# Patient Record
Sex: Female | Born: 1975 | Race: Black or African American | Hispanic: No | Smoking: Current every day smoker
Health system: Southern US, Community
[De-identification: ages and names within clinical notes are randomized; demographics above are authoritative.]

## PROBLEM LIST (undated history)

## (undated) DIAGNOSIS — D649 Anemia, unspecified: Secondary | ICD-10-CM

## (undated) DIAGNOSIS — M545 Low back pain, unspecified: Secondary | ICD-10-CM

## (undated) DIAGNOSIS — D219 Benign neoplasm of connective and other soft tissue, unspecified: Secondary | ICD-10-CM

## (undated) DIAGNOSIS — M5090 Cervical disc disorder, unspecified, unspecified cervical region: Secondary | ICD-10-CM

## (undated) DIAGNOSIS — K56600 Partial intestinal obstruction, unspecified as to cause: Secondary | ICD-10-CM

## (undated) DIAGNOSIS — R011 Cardiac murmur, unspecified: Secondary | ICD-10-CM

## (undated) HISTORY — PX: ANKLE SURGERY: SHX546

## (undated) HISTORY — PX: TUBAL LIGATION: SHX77

---

## 1998-02-07 ENCOUNTER — Inpatient Hospital Stay (HOSPITAL_COMMUNITY): Admission: AD | Admit: 1998-02-07 | Discharge: 1998-02-07 | Payer: Self-pay | Admitting: Obstetrics

## 1998-06-18 ENCOUNTER — Emergency Department (HOSPITAL_COMMUNITY): Admission: EM | Admit: 1998-06-18 | Discharge: 1998-06-18 | Payer: Self-pay | Admitting: Emergency Medicine

## 1998-12-26 ENCOUNTER — Encounter: Payer: Self-pay | Admitting: Emergency Medicine

## 1998-12-27 ENCOUNTER — Encounter: Payer: Self-pay | Admitting: Orthopedic Surgery

## 1998-12-27 ENCOUNTER — Inpatient Hospital Stay (HOSPITAL_COMMUNITY): Admission: EM | Admit: 1998-12-27 | Discharge: 1998-12-28 | Payer: Self-pay | Admitting: Emergency Medicine

## 1999-06-23 ENCOUNTER — Inpatient Hospital Stay (HOSPITAL_COMMUNITY): Admission: AD | Admit: 1999-06-23 | Discharge: 1999-06-23 | Payer: Self-pay | Admitting: Obstetrics

## 1999-09-10 ENCOUNTER — Emergency Department (HOSPITAL_COMMUNITY): Admission: EM | Admit: 1999-09-10 | Discharge: 1999-09-10 | Payer: Self-pay | Admitting: Emergency Medicine

## 1999-12-15 ENCOUNTER — Inpatient Hospital Stay (HOSPITAL_COMMUNITY): Admission: AD | Admit: 1999-12-15 | Discharge: 1999-12-15 | Payer: Self-pay | Admitting: Obstetrics

## 2000-01-05 ENCOUNTER — Other Ambulatory Visit: Admission: RE | Admit: 2000-01-05 | Discharge: 2000-01-05 | Payer: Self-pay | Admitting: Obstetrics

## 2000-10-21 ENCOUNTER — Emergency Department (HOSPITAL_COMMUNITY): Admission: EM | Admit: 2000-10-21 | Discharge: 2000-10-21 | Payer: Self-pay

## 2001-02-27 ENCOUNTER — Other Ambulatory Visit: Admission: RE | Admit: 2001-02-27 | Discharge: 2001-02-27 | Payer: Self-pay | Admitting: Obstetrics

## 2001-04-27 ENCOUNTER — Inpatient Hospital Stay (HOSPITAL_COMMUNITY): Admission: AD | Admit: 2001-04-27 | Discharge: 2001-04-27 | Payer: Self-pay | Admitting: Obstetrics

## 2001-06-30 ENCOUNTER — Emergency Department (HOSPITAL_COMMUNITY): Admission: EM | Admit: 2001-06-30 | Discharge: 2001-06-30 | Payer: Self-pay | Admitting: Emergency Medicine

## 2002-08-07 ENCOUNTER — Inpatient Hospital Stay (HOSPITAL_COMMUNITY): Admission: AD | Admit: 2002-08-07 | Discharge: 2002-08-07 | Payer: Self-pay | Admitting: Obstetrics

## 2002-08-13 ENCOUNTER — Inpatient Hospital Stay (HOSPITAL_COMMUNITY): Admission: AD | Admit: 2002-08-13 | Discharge: 2002-08-16 | Payer: Self-pay | Admitting: Obstetrics

## 2002-08-13 ENCOUNTER — Encounter (INDEPENDENT_AMBULATORY_CARE_PROVIDER_SITE_OTHER): Payer: Self-pay

## 2003-02-08 ENCOUNTER — Emergency Department (HOSPITAL_COMMUNITY): Admission: EM | Admit: 2003-02-08 | Discharge: 2003-02-09 | Payer: Self-pay | Admitting: Emergency Medicine

## 2003-05-08 ENCOUNTER — Inpatient Hospital Stay (HOSPITAL_COMMUNITY): Admission: AD | Admit: 2003-05-08 | Discharge: 2003-05-08 | Payer: Self-pay | Admitting: Obstetrics

## 2003-11-15 ENCOUNTER — Inpatient Hospital Stay (HOSPITAL_COMMUNITY): Admission: AD | Admit: 2003-11-15 | Discharge: 2003-11-15 | Payer: Self-pay | Admitting: Obstetrics

## 2004-03-05 ENCOUNTER — Emergency Department (HOSPITAL_COMMUNITY): Admission: EM | Admit: 2004-03-05 | Discharge: 2004-03-05 | Payer: Self-pay | Admitting: Emergency Medicine

## 2005-01-10 ENCOUNTER — Inpatient Hospital Stay (HOSPITAL_COMMUNITY): Admission: AD | Admit: 2005-01-10 | Discharge: 2005-01-10 | Payer: Self-pay | Admitting: Obstetrics

## 2005-05-16 ENCOUNTER — Emergency Department (HOSPITAL_COMMUNITY): Admission: EM | Admit: 2005-05-16 | Discharge: 2005-05-17 | Payer: Self-pay | Admitting: Emergency Medicine

## 2005-05-22 ENCOUNTER — Inpatient Hospital Stay (HOSPITAL_COMMUNITY): Admission: AD | Admit: 2005-05-22 | Discharge: 2005-05-22 | Payer: Self-pay | Admitting: Obstetrics

## 2005-09-09 ENCOUNTER — Inpatient Hospital Stay (HOSPITAL_COMMUNITY): Admission: AD | Admit: 2005-09-09 | Discharge: 2005-09-09 | Payer: Self-pay | Admitting: Obstetrics

## 2006-03-17 ENCOUNTER — Emergency Department (HOSPITAL_COMMUNITY): Admission: EM | Admit: 2006-03-17 | Discharge: 2006-03-17 | Payer: Self-pay | Admitting: Emergency Medicine

## 2006-04-12 ENCOUNTER — Encounter: Payer: Self-pay | Admitting: Orthopedic Surgery

## 2007-05-08 ENCOUNTER — Emergency Department (HOSPITAL_COMMUNITY): Admission: EM | Admit: 2007-05-08 | Discharge: 2007-05-08 | Payer: Self-pay | Admitting: Family Medicine

## 2007-05-17 ENCOUNTER — Emergency Department (HOSPITAL_COMMUNITY): Admission: EM | Admit: 2007-05-17 | Discharge: 2007-05-17 | Payer: Self-pay | Admitting: Emergency Medicine

## 2007-10-02 ENCOUNTER — Emergency Department (HOSPITAL_COMMUNITY): Admission: EM | Admit: 2007-10-02 | Discharge: 2007-10-02 | Payer: Self-pay | Admitting: Emergency Medicine

## 2007-12-31 ENCOUNTER — Emergency Department (HOSPITAL_COMMUNITY): Admission: EM | Admit: 2007-12-31 | Discharge: 2007-12-31 | Payer: Self-pay | Admitting: Family Medicine

## 2008-01-31 ENCOUNTER — Inpatient Hospital Stay (HOSPITAL_COMMUNITY): Admission: AD | Admit: 2008-01-31 | Discharge: 2008-02-01 | Payer: Self-pay | Admitting: Obstetrics

## 2008-03-28 ENCOUNTER — Emergency Department (HOSPITAL_COMMUNITY): Admission: EM | Admit: 2008-03-28 | Discharge: 2008-03-28 | Payer: Self-pay | Admitting: Emergency Medicine

## 2008-06-11 ENCOUNTER — Emergency Department (HOSPITAL_COMMUNITY): Admission: EM | Admit: 2008-06-11 | Discharge: 2008-06-11 | Payer: Self-pay | Admitting: Emergency Medicine

## 2008-07-09 ENCOUNTER — Inpatient Hospital Stay (HOSPITAL_COMMUNITY): Admission: AD | Admit: 2008-07-09 | Discharge: 2008-07-09 | Payer: Self-pay | Admitting: Obstetrics

## 2008-07-17 ENCOUNTER — Observation Stay (HOSPITAL_COMMUNITY): Admission: AD | Admit: 2008-07-17 | Discharge: 2008-07-18 | Payer: Self-pay | Admitting: Obstetrics

## 2008-07-19 ENCOUNTER — Inpatient Hospital Stay (HOSPITAL_COMMUNITY): Admission: AD | Admit: 2008-07-19 | Discharge: 2008-07-21 | Payer: Self-pay | Admitting: Obstetrics

## 2008-07-22 ENCOUNTER — Inpatient Hospital Stay (HOSPITAL_COMMUNITY): Admission: AD | Admit: 2008-07-22 | Discharge: 2008-07-22 | Payer: Self-pay | Admitting: Obstetrics

## 2008-08-04 ENCOUNTER — Inpatient Hospital Stay (HOSPITAL_COMMUNITY): Admission: AD | Admit: 2008-08-04 | Discharge: 2008-08-07 | Payer: Self-pay | Admitting: Obstetrics

## 2008-08-19 ENCOUNTER — Encounter (INDEPENDENT_AMBULATORY_CARE_PROVIDER_SITE_OTHER): Payer: Self-pay | Admitting: Obstetrics

## 2008-08-19 ENCOUNTER — Inpatient Hospital Stay (HOSPITAL_COMMUNITY): Admission: AD | Admit: 2008-08-19 | Discharge: 2008-08-22 | Payer: Self-pay | Admitting: Obstetrics

## 2008-09-14 ENCOUNTER — Emergency Department (HOSPITAL_COMMUNITY): Admission: EM | Admit: 2008-09-14 | Discharge: 2008-09-14 | Payer: Self-pay | Admitting: Emergency Medicine

## 2008-09-18 ENCOUNTER — Encounter: Payer: Self-pay | Admitting: Emergency Medicine

## 2008-09-19 ENCOUNTER — Inpatient Hospital Stay (HOSPITAL_COMMUNITY): Admission: RE | Admit: 2008-09-19 | Discharge: 2008-09-22 | Payer: Self-pay | Admitting: Internal Medicine

## 2010-12-11 ENCOUNTER — Emergency Department (HOSPITAL_COMMUNITY)
Admission: EM | Admit: 2010-12-11 | Discharge: 2010-12-11 | Payer: Self-pay | Source: Home / Self Care | Admitting: Emergency Medicine

## 2011-02-21 LAB — URINE CULTURE
Colony Count: >=100000
Culture  Setup Time: 201201011045

## 2011-02-21 LAB — URINE MICROSCOPIC-ADD ON

## 2011-02-21 LAB — URINALYSIS, ROUTINE W REFLEX MICROSCOPIC
Bilirubin Urine: NEGATIVE
Glucose, UA: NEGATIVE mg/dL
Ketones, ur: NEGATIVE mg/dL
Nitrite: POSITIVE — AB
Protein, ur: NEGATIVE mg/dL
Specific Gravity, Urine: 1.014 (ref 1.005–1.030)
Urobilinogen, UA: 1 mg/dL (ref 0.0–1.0)
pH: 6.5 (ref 5.0–8.0)

## 2011-02-21 LAB — WET PREP, GENITAL
Trich, Wet Prep: NONE SEEN
Yeast Wet Prep HPF POC: NONE SEEN

## 2011-02-21 LAB — GC/CHLAMYDIA PROBE AMP, GENITAL
Chlamydia, DNA Probe: NEGATIVE
GC Probe Amp, Genital: POSITIVE — AB

## 2011-02-21 LAB — POCT PREGNANCY, URINE: Preg Test, Ur: NEGATIVE

## 2011-04-26 NOTE — Discharge Summary (Signed)
NAMECHRISTLE, NOLTING               ACCOUNT NO.:  [id number]   MEDICAL RECORD NO.:  [id number]          PATIENT TYPE:  INP   LOCATION:  9117                          FACILITY:  [facility]   PHYSICIAN:  Kathreen Cosier, M.D.DATE OF BIRTH:  [birth year]   DATE OF ADMISSION:  08/19/2008  DATE OF DISCHARGE:  08/22/2008                               DISCHARGE SUMMARY   The patient is a 35 year old gravida 4, para 3-0-0-3.  She had 3  previous C-sections and was hospitalized with multiple times with  premature contractions.  She also desired sterilization.  She was now at  term.  EDC was September 09, 2008.  The patient underwent repeat low-  transverse cesarean section.  She had a female, Apgar 9 and 9, and  weighing 7 pounds 5 ounces.  Placenta was posterior.  Postop hemoglobin was 7.7.  She was asymptomatic and treated with  ferrous sulfate 325 p.o. b.i.d.  Urine was negative.  RPR was negative.  HIV negative.  She did well postop and was discharged on the third  postoperative day, ambulatory, on a regular diet, and to see me in 6  weeks.   DISCHARGE DIAGNOSIS:  Status post repeat low-transverse cesarean section  and tubal ligation at term.           ______________________________  Kathreen Cosier, M.D.     BAM/MEDQ  D:  09/17/2008  T:  09/17/2008  Job:  [id number]

## 2011-04-26 NOTE — H&P (Signed)
NAMEDARLETTE, DUBOW               ACCOUNT NO.:  [id number]   MEDICAL RECORD NO.:  [id number]          PATIENT TYPE:  INP   LOCATION:  5011                         FACILITY:  [facility]   PHYSICIAN:  Peggye Pitt, M.D. DATE OF BIRTH:  1975-12-17   DATE OF ADMISSION:  09/19/2008  DATE OF DISCHARGE:                              HISTORY & PHYSICAL   PRIMARY CARE PHYSICIAN:  Unassigned.   CHIEF COMPLAINT:  Fever and cough.   HISTORY OF PRESENT ILLNESS:  Mrs. Kun is a 35 year old African American  woman who had a recent delivery of a healthy baby boy on August 19, 2008, who subsequently returned to the emergency department on September 14, 2008, with high fever and cough.  Chest x-ray showed a right left  lower lobe pneumonia and was given a Z-pack and sent home.  She returns  today, 4-days later, because of increased fevers up to 103 degrees  Fahrenheit, as well as cough productive of yellow sputum.  She denies  any chest pain or any other symptoms, this is why we are called for  admission.   ALLERGIES:  NO KNOWN DRUG ALLERGIES.   PAST MEDICAL HISTORY:  Not significant.   HOME MEDICATIONS:  None.   FAMILY HISTORY:  Noncontributory.   SOCIAL HISTORY:  She denies any alcohol, tobacco or illicit drug use.  Lives with her husband and her newborn son.   REVIEW OF SYSTEMS:  Negative except as per HPI.   PHYSICAL EXAMINATION:  VITAL SIGNS:  Upon admission, blood pressure  103/49, heart rate 97, respirations 20, O2 sats 93% on room air with a  temperature of 99.6.  GENERAL:  She is alert, awake and oriented x3 in no acute distress.  HEENT:  Normocephalic, atraumatic.  Her pupils are equal and reactive to  light and accommodation with intact extraocular movements.  NECK:  Supple with no JVD, no lymphadenopathy, no bruits of goiter.  HEART:  Regular rate and rhythm with no murmurs, rubs or gallops.  LUNGS:  She has bibasilar crackles.  The crackles extend as well up to  her right  mid lung field.  No rhonchi or wheezes auscultated.  ABDOMEN:  Soft, nontender, nondistended with positive bowel sounds.  EXTREMITIES:  No edema with positive pedal pulses.  NEUROLOGIC:  Grossly intact and nonfocal.   LABORATORY DATA:  Upon admission, sodium 140, potassium 3.4, chloride  105, bicarb 22, BUN less than 3, creatinine 0.9, glucose 92.  WBC 3.9.  Hemoglobin 9.2.  Of note, it was 7.7 after her delivery on August 22, 2008.  Her MCV is 84.7 and platelets of 206.  She had a chest x-ray that  showed bilateral pneumonia with marked progression on the right.   ASSESSMENT/PLAN:  1. Pneumonia.  Will check and blood and sputum cultures.  Will cover      it as a community-acquired pneumonia with Rocephin and azithromycin      daily.  Will also place on Tamiflu given possibility of influenza.      Will keep on droplet precautions.  2. Hypokalemia.  Will replete p.o. and check  a magnesium level and      replete that as necessary.  3. For her anemia which appears to be normocytic, she was diagnosed      with iron deficiency anemia after delivery and of note, her      hemoglobin has improved significantly, 2 gm over the past month.  4. For her leukopenia which of course would concern for me for human      immunodeficiency virus, however, during her recent delivery her      human immunodeficiency virus test was negative.  Will just follow.      Of note, a viral prodrome could cause leukopenia.  5. For prophylaxis while in the hospital will place on Protonix for      gastrointestinal prophylaxis and subcutaneous Lovenox for deep      venous thrombosis prophylaxis.      Peggye Pitt, M.D.  Electronically Signed     EH/MEDQ  D:  09/19/2008  T:  09/19/2008  Job:  [id number]

## 2011-04-26 NOTE — Discharge Summary (Signed)
[name], [name]               ACCOUNT NO.:  [id number]   MEDICAL RECORD NO.:  [id number]          PATIENT TYPE:  INP   LOCATION:  5011                         FACILITY:  [facility]   PHYSICIAN:  Megan Scott, MD     DATE OF BIRTH:  08/13/1976   DATE OF ADMISSION:  09/19/2008  DATE OF DISCHARGE:  09/22/2008                               DISCHARGE SUMMARY   PRIMARY MEDICAL DOCTOR:  [name] but a referral has been provided to  see Dr. Della Compton as an outpatient.   DISCHARGE DIAGNOSES:  1. Bilateral pneumonia.  Likely community-acquired pneumonia versus ?      postinfluenza.  2. Normocytic anemia.  3. Tobacco abuse.  4. Hypokalemia - repleted.   DISCHARGE MEDICATIONS:  1. Tamiflu 75 mg p.o. b.i.d. to complete 5-day course.  2. Avelox 400 mg p.o. daily for 6 days.  3. Nicotine 21 mg per 24-hour patch 1 daily for 5 weeks then taper.  4. Robitussin-DM 5 mL p.o. q. 6 hourly p.r.n.  5. Iron tablets at previous home dose.   PROCEDURES:  Chest x-ray on September 18, 2008: Impression is the patient  has bilateral pneumonia with marked progression of pneumonia on the  right.   PERTINENT LABS:  1. Basic metabolic panel today unremarkable with BUN of less than 1,      creatinine 0.64, calcium 8.3 but albumin was low and corrected to      that, it is normal.  2. CBCs with hemoglobin 8.5, hematocrit 26.2, white blood cell 3.6,      platelets 256.  3. Sputum culture was normal oropharyngeal flora.  4. Blood cultures x4 are no growth to date.   CONSULTATIONS:  This was discussed with infectious disease doctor, [name].   HOSPITAL COURSE AND PATIENT DISPOSITION:  Please refer to the history  and physical note for initial admission details.  In summary, [name]  is a very pleasant 35 year old [place] American female patient who  recently delivered a healthy baby on August 19, 2008.  She was seen on  October 4 at the emergency department with fever and cough and  assessed  to have right lower lobe pneumonia and discharged on Z-Pak.  However,  she returned this time, 4 days later, with worsening fever up to 103  degrees Fahrenheit and cough productive of yellow sputum.  She was  afebrile in the emergency room but her chest x-ray suggested worsening  bilateral pneumonia especially on the right, and the patient was thereby  admitted for further evaluation and management.   1. Bilateral pneumonia, right greater than the left.  The patient was      admitted through the [facility] emergency department.      The patient was subsequently transferred to [facility].      She was placed on droplet precautions.  Cultures were sent off.      The sputum culture was not helpful, but the blood cultures are      negative to date.  The patient was empirically placed on IV      ceftriaxone and  Zithromax and Tamiflu. The patient has remained      afebrile in the hospital.  She has progressively done well and has      had almost resolution of her cough.  There is no fever, chest pain      or dyspnea.  The patient has been ambulant and tolerated feeds.      She has been eager to go home since yesterday.  I have discussed      her case in detail with infectious disease MD who suggests that she      could be discharged home but has to follow precautions such as      wearing a loop mask at home until she has completed her course of      Tamiflu and has been afebrile for a 7 days,and hygiene.  She is      also advised to discuss with her child's pediatrician regarding      empiric Tamiflu for her child.  Also, if child develops any      symptoms such as fever, cough or anything unusual, she has been      advised to seek immediate medical attention for the child.  She has      not breast fed the child for 2 weeks and indicates that she is not      going to breastfeed her child anyway.  She is to follow up with her      primary medical doctor in a week's  time with repeat CBCs.  Also,      tobacco cessation has been counseled and the patient requested the      nicotine patch which will be provided.  2. Anemia.  Unclear what her baseline is but she says that her      hemoglobin was low and she has been given iron supplements. The      patient is advised to continue the same and follow-up with primary      medical doctor with repeat CBC in a week's time.  3. Tobacco abuse.  The patient counseling done.  4. Hypokalemia.  Repleted.   The patient at this time is stable to be discharged and be followed up  as an outpatient.      Megan Scott, MD  Electronically Signed     AH/MEDQ  D:  09/22/2008  T:  09/22/2008  Job:  [id number]   cc:   [name], M.D.  [name], M.D.  Megan Sell, MD

## 2011-04-26 NOTE — Discharge Summary (Signed)
[name], COBERN               ACCOUNT NO.:  [id number]   MEDICAL RECORD NO.:  [id number]          PATIENT TYPE:  INP   LOCATION:  9155                          [facility]ILITY[facility]   PHYSICIAN:  Kathreen Cosier, M.D.DATE OF BIRTH:  03-01-1976   DATE OF ADMISSION:  07/19/2008  DATE OF DISCHARGE:  07/21/2008                               DISCHARGE SUMMARY   The patient is a 35 year old gravida 5, para 3-0-1-3, had three previous  C-sections, and [facility] September 09, 2008.  She was admitted with premature  contractions.  She had been on terbutaline at home, and she was admitted  and placed on magnesium sulfate 4 g and avoiding 2 g an hour.  By July 20, 2008, her contractions had stopped, and she was placed on Procardia  XL 30 b.i.d. and discharged on July 21, 2008.   DISCHARGE DIAGNOSIS:  Status post premature contractions and is to see  me in 2 weeks.           ______________________________  Kathreen Cosier, M.D.     BAM/MEDQ  D:  08/06/2008  T:  08/06/2008  Job:  [id number]

## 2011-04-29 NOTE — Op Note (Signed)
   NAME:  [name], [name]                         ACCOUNT NO.:  [id number]   MEDICAL RECORD NO.:  [id number]                   PATIENT TYPE:  INP   LOCATION:  9133                                 [facility]ACILITY[facility]   PHYSICIAN:  Kathreen Cosier, M.D.           DATE OF BIRTH:  Jun 17, 1976   DATE OF PROCEDURE:  08/13/2002  DATE OF DISCHARGE:                                 OPERATIVE REPORT   PREOPERATIVE DIAGNOSES:  1. Previous cesarean section x2.  2. At 36 weeks in labor.   POSTOPERATIVE DIAGNOSES:  1. Previous cesarean section x2.  2. At 36 weeks in labor.   PROCEDURE:  Repeat low transverse cesarean section.   SURGEON:  Kathreen Cosier, M.D.   ANESTHESIA:  Spinal.   DESCRIPTION OF PROCEDURE:  The patient was placed on the operating table in  the supine position.  After the spinal, the abdomen prepped and draped,  bladder emptied with a Foley catheter.  Transverse suprapubic incision is  made and carried down to the rectus fascia.  The fascia was cleaned and  incised the length of the incision.  Recti muscles were retracted laterally.  The peritoneum was incised longitudinally.  There were multiple adhesions  between the anterior peritoneum and the anterior surface of the uterus.  This was dissected with blunt dissection.  A transverse lower uterine  incision was made.  The fluid was clear.  The patient was delivered with the  vacuum after both recti muscles were cut to create the space and delivered  from the OP position of a female, Apgars 6 and 8, weighing 5 pounds, 11  ounces.  There two pop offs of the vacuum.  The team was in attendance.  The  fluid was cleared.  The placenta was anterior and removed manually and sent  to pathology.  The uterine cavity was cleaned with dry laps.  The uterine  incision was closed in one layer with continuous suture of #1 chromic.  Hemostasis was satisfactory.  Tubes and ovaries were normal.  The fascia was  closed with continuous suture  of 0 Dexon and the skin closed with  subcuticular stitch of 3-0 plain.  The patient tolerated the procedure well  and was taken to the recovery room in good condition.  Blood loss was 600  cc.                                               Kathreen Cosier, M.D.    BAM/MEDQ  D:  08/13/2002  T:  08/13/2002  Job:  [id number]

## 2011-04-29 NOTE — H&P (Signed)
   NAME:  [name], [name]                         ACCOUNT NO.:  [id number]   MEDICAL RECORD NO.:  [id number]                   PATIENT TYPE:  INP   LOCATION:  9133                                 [facility]   PHYSICIAN:  Kathreen Cosier, M.D.           DATE OF BIRTH:  [birth year]   DATE OF ADMISSION:  08/13/2002  DATE OF DISCHARGE:                                HISTORY & PHYSICAL   HISTORY:  The patient is a 35 year old gravida 4, para 2, 1 admitted  August 13, 2002.  Had two previous C-sections and, during this pregnancy,  has had episodes of premature contractions.  Has been on terbutaline, came  in tonight laboring but terbutaline had no effect.  She was 36+ weeks.  It  was decided that she would deliver by repeat C-section.   PHYSICAL EXAMINATION:  GENERAL:  Well-developed female in labor.  HEENT:  Negative.  LUNGS:  Clear.  HEART:  Regular rate and rhythm.  No murmurs.  No gallops.  ABDOMEN:  Term-sized uterus.  The cervix was fingertip with a vertex  __________.                                                Kathreen Cosier, M.D.    BAM/MEDQ  D:  08/13/2002  T:  08/13/2002  Job:  [id number]

## 2011-04-29 NOTE — Discharge Summary (Signed)
   NAME:  [name], [name]                         ACCOUNT NO.:  [id number]   MEDICAL RECORD NO.:  [id number]                   PATIENT TYPE:  INP   LOCATION:  9133                                 [facility]   PHYSICIAN:  Kathreen Cosier, M.D.           DATE OF BIRTH:  1976-05-12   DATE OF ADMISSION:  08/13/2002  DATE OF DISCHARGE:  08/16/2002                                 DISCHARGE SUMMARY   HISTORY OF PRESENT ILLNESS:  The patient is a 35 year old gravida 4 para 2-0-  1-2, EDC September 08, 2002.  She had two previous C sections and had been  on terbutaline throughout this pregnancy and was admitted in labor.  She was  for repeat C section with two previous C sections.   HOSPITAL COURSE:  The patient, after having a spinal had a low transverse  repeat C section.  She had a female, Apgar 6 and 8, weighing 5 pounds 11  ounces.  The placenta was anterior and sent to pathology.  On admission her  hemoglobin was 10.9, postoperatively 8.1.  She was asymptomatic and  discharged on postoperative day #3, ambulatory, on a regular diet, to see me  in six weeks.   DISCHARGE DIAGNOSIS:  Status post repeat low transverse cesarean section at  36+ weeks because of two previous C sections, in labor.                                               Kathreen Cosier, M.D.    BAM/MEDQ  D:  08/16/2002  T:  08/16/2002  Job:  [id number]

## 2011-04-29 NOTE — Op Note (Signed)
[name], [name]               ACCOUNT NO.:  [id number]   MEDICAL RECORD NO.:  [id number]          PATIENT TYPE:  INP   LOCATION:  9117                          [facility]ACILITY[facility]   PHYSICIAN:  Kathreen Cosier, M.D.DATE OF BIRTH:  01-18-1976   DATE OF PROCEDURE:  DATE OF DISCHARGE:  08/22/2008                               OPERATIVE REPORT   PREOPERATIVE DIAGNOSES:  1. Previous cesarean section at term.  2. Multiparity.  3. Premature labor.   SURGEON:  Kathreen Cosier, MD.   FIRST ASSISTANT:  Charles A. Clearance Coots, MD   ANESTHESIA:  Spinal.   PROCEDURE:  The patient placed on the operating table in the supine  position.  After the spinal anesthesia was administered, the abdomen was  prepped and draped.  Bladder emptied with a Foley catheter.  A  transverse suprapubic incision was made through the old scar and carried  down to the rectus fascia.  Fascia cleaned and incised to length of the  incision.  The recti muscles were retracted laterally.  Peritoneum was  incised longitudinally.  Transverse incision was made in the visceral  peritoneum above the bladder.  Bladder mobilized inferiorly.  Transverse  lower uterine incision was made.  The fluid was clear.  Team in  attendance.  The patient delivered of a female, Apgar 9 and 9, weighing 7  pounds 5 ounces.  The placenta was posterior fundal and removed manually  and sent to Labor and Delivery.  Uterine cavity cleaned with dry laps.  Uterine incision closed in 1 layer with continuous suture of #1 chromic.  Hemostasis was satisfactory.  Bladder flap reattached with 2-0 chromic.  Uterus well contracted.  Tubes and ovaries were normal.  Right tube  grabbed in mid portion with Babcock clamp.  A 0 plain suture placed in  the mesosalpinx in the portion of tube within the clamp.  This was tied  and then approximately 1 inch of tube transected.  Procedure was done in  a similar fashion on the other side.  Lap and sponge counts were  correct.  Abdomen closed in layers; peritoneum, continuous suture of 0  chromic; fascia, continuous suture of 0 Dexon; and the skin closed with  subcuticular stitch of 4-0 Monocryl.  The patient tolerated the  procedure well and taken to recovery room in good condition.           ______________________________  Kathreen Cosier, M.D.     BAM/MEDQ  D:  09/24/2008  T:  09/24/2008  Job:  [id number]

## 2011-04-29 NOTE — Discharge Summary (Signed)
NAMEDEEM, MARMOL NO.:  [id number]   MEDICAL RECORD NO.:  [id number]         PATIENT TYPE:  WINP   LOCATION:                                [facility]   PHYSICIAN:  Kathreen Cosier, M.D.DATE OF BIRTH:  03-06-1976   DATE OF ADMISSION:  08/04/2008  DATE OF DISCHARGE:  08/07/2008                               DISCHARGE SUMMARY   The patient is a 35 year old gravida 5, para 3-0-1-3, [facility] September 09, 2008, has a three previous C-sections and had multiple admissions  because of premature contractions.  She was on terbutaline p.o. and  hospitalized earlier in the pregnancy.  Cervix closed.  She was started  on ampicillin 2 g IV every 6 hours and gentamicin IV and also magnesium  sulfate.  Premature contractions at 34 weeks.  On August 06, 2008, the  magnesium was discontinued.  She was started on terbutaline 2.5 p.o. q.4  h. and she did well.  By August 07, 2008, she stopped contracting,  progressed in 24 hours, and was discharged home on terbutaline 2.5 p.o.  q.4 h.   DISCHARGE DIAGNOSIS:  Status post premature contractions at 34 weeks.           ______________________________  Kathreen Cosier, M.D.     BAM/MEDQ  D:  08/27/2008  T:  08/27/2008  Job:  [id number]

## 2011-08-09 ENCOUNTER — Other Ambulatory Visit (HOSPITAL_COMMUNITY): Payer: Self-pay | Admitting: Obstetrics

## 2011-08-09 DIAGNOSIS — Z1231 Encounter for screening mammogram for malignant neoplasm of breast: Secondary | ICD-10-CM

## 2011-09-01 LAB — POCT URINALYSIS DIP (DEVICE)
Glucose, UA: NEGATIVE
Nitrite: POSITIVE — AB
Operator id: 270961
Protein, ur: 30 — AB
Specific Gravity, Urine: 1.02
Urobilinogen, UA: 1
pH: 7

## 2011-09-01 LAB — POCT PREGNANCY, URINE
Operator id: 270961
Preg Test, Ur: POSITIVE

## 2011-09-05 LAB — WET PREP, GENITAL
Clue Cells Wet Prep HPF POC: NONE SEEN
Trich, Wet Prep: NONE SEEN
Yeast Wet Prep HPF POC: NONE SEEN

## 2011-09-05 LAB — GC/CHLAMYDIA PROBE AMP, GENITAL
Chlamydia, DNA Probe: NEGATIVE
GC Probe Amp, Genital: NEGATIVE

## 2011-09-06 LAB — STREP A DNA PROBE: Group A Strep Probe: NEGATIVE

## 2011-09-06 LAB — RAPID STREP SCREEN (MED CTR MEBANE ONLY): Streptococcus, Group A Screen (Direct): NEGATIVE

## 2011-09-08 LAB — URINALYSIS, ROUTINE W REFLEX MICROSCOPIC
Bilirubin Urine: NEGATIVE
Glucose, UA: NEGATIVE
Hgb urine dipstick: NEGATIVE
Ketones, ur: NEGATIVE
Nitrite: NEGATIVE
Protein, ur: NEGATIVE
Specific Gravity, Urine: 1.019
Urobilinogen, UA: 1
pH: 7

## 2011-09-08 LAB — WET PREP, GENITAL
Clue Cells Wet Prep HPF POC: NONE SEEN
Trich, Wet Prep: NONE SEEN
WBC, Wet Prep HPF POC: NONE SEEN
Yeast Wet Prep HPF POC: NONE SEEN

## 2011-09-08 LAB — GC/CHLAMYDIA PROBE AMP, GENITAL
Chlamydia, DNA Probe: NEGATIVE
GC Probe Amp, Genital: NEGATIVE

## 2011-09-09 LAB — URINALYSIS, ROUTINE W REFLEX MICROSCOPIC
Bilirubin Urine: NEGATIVE
Glucose, UA: NEGATIVE
Hgb urine dipstick: NEGATIVE
Ketones, ur: NEGATIVE
Nitrite: NEGATIVE
Protein, ur: NEGATIVE
Specific Gravity, Urine: 1.025
Urobilinogen, UA: 0.2
pH: 6.5

## 2011-09-09 LAB — CBC
HCT: 24.9 — ABNORMAL LOW
HCT: 26.6 — ABNORMAL LOW
Hemoglobin: 8.2 — ABNORMAL LOW
Hemoglobin: 8.8 — ABNORMAL LOW
MCHC: 32.8
MCHC: 33
MCV: 91.6
MCV: 92.2
Platelets: 240
Platelets: 243
RBC: 2.7 — ABNORMAL LOW
RBC: 2.91 — ABNORMAL LOW
RDW: 13.9
RDW: 14.1
WBC: 7.9
WBC: 9.8

## 2011-09-09 LAB — RPR: RPR Ser Ql: NONREACTIVE

## 2011-09-09 LAB — MAGNESIUM: Magnesium: 3.9 — ABNORMAL HIGH

## 2011-09-12 LAB — CBC
HCT: 29.2 — ABNORMAL LOW
HCT: 26.2 — ABNORMAL LOW
HCT: 28.2 — ABNORMAL LOW
Hemoglobin: 9.2 — ABNORMAL LOW
Hemoglobin: 8.5 — ABNORMAL LOW
Hemoglobin: 9.1 — ABNORMAL LOW
MCHC: 31.6
MCHC: 32.1
MCHC: 32.4
MCV: 84.7
MCV: 82.8
MCV: 83.8
Platelets: 206
Platelets: 196
Platelets: 258
RBC: 3.45 — ABNORMAL LOW
RBC: 3.16 — ABNORMAL LOW
RBC: 3.37 — ABNORMAL LOW
RDW: 16.7 — ABNORMAL HIGH
RDW: 17.1 — ABNORMAL HIGH
RDW: 17.4 — ABNORMAL HIGH
WBC: 3.9 — ABNORMAL LOW
WBC: 3.6 — ABNORMAL LOW
WBC: 5

## 2011-09-12 LAB — BASIC METABOLIC PANEL
BUN: 3 — ABNORMAL LOW
BUN: <1 — ABNORMAL LOW
CO2: 22
CO2: 24
Calcium: 8 — ABNORMAL LOW
Calcium: 8.3 — ABNORMAL LOW
Chloride: 109
Chloride: 109
Creatinine, Ser: 0.56
Creatinine, Ser: 0.64
GFR calc Af Amer: >60
GFR calc Af Amer: >60
GFR calc non Af Amer: >60
GFR calc non Af Amer: >60
Glucose, Bld: 102 — ABNORMAL HIGH
Glucose, Bld: 92
Potassium: 3.6
Potassium: 4.2
Sodium: 138
Sodium: 141

## 2011-09-12 LAB — POCT I-STAT, CHEM 8
BUN: <3 — ABNORMAL LOW
Calcium, Ion: 1.07 — ABNORMAL LOW
Chloride: 105
Creatinine, Ser: 0.9
Glucose, Bld: 92
HCT: 30 — ABNORMAL LOW
Hemoglobin: 10.2 — ABNORMAL LOW
Potassium: 3.4 — ABNORMAL LOW
Sodium: 140
TCO2: 22

## 2011-09-12 LAB — COMPREHENSIVE METABOLIC PANEL
ALT: 12
AST: 29
Albumin: 2.5 — ABNORMAL LOW
Alkaline Phosphatase: 63
BUN: 4 — ABNORMAL LOW
CO2: 23
Calcium: 7.9 — ABNORMAL LOW
Chloride: 111
Creatinine, Ser: 0.64
GFR calc Af Amer: >60
GFR calc non Af Amer: >60
Glucose, Bld: 113 — ABNORMAL HIGH
Potassium: 4
Sodium: 139
Total Bilirubin: 0.3
Total Protein: 6.2

## 2011-09-12 LAB — DIFFERENTIAL
Basophils Absolute: 0
Basophils Relative: 0
Eosinophils Absolute: 0
Eosinophils Relative: 0
Lymphocytes Relative: 36
Lymphs Abs: 1.4
Monocytes Absolute: 0.3
Monocytes Relative: 7
Neutro Abs: 2.2
Neutrophils Relative %: 56

## 2011-09-12 LAB — CULTURE, RESPIRATORY W GRAM STAIN
Culture: NORMAL
Gram Stain: NONE SEEN

## 2011-09-12 LAB — CULTURE, BLOOD (ROUTINE X 2)
Culture: NO GROWTH
Culture: NO GROWTH
Culture: NO GROWTH
Culture: NO GROWTH

## 2011-09-12 LAB — GRAM STAIN: Gram Stain: NONE SEEN

## 2011-09-12 LAB — PHOSPHORUS: Phosphorus: 2.7

## 2011-09-12 LAB — CALCIUM: Calcium: 7.6 — ABNORMAL LOW

## 2011-09-12 LAB — MAGNESIUM: Magnesium: 2

## 2011-09-14 LAB — TYPE AND SCREEN
ABO/RH(D): O POS
Antibody Screen: NEGATIVE

## 2011-09-14 LAB — URINALYSIS, ROUTINE W REFLEX MICROSCOPIC
Bilirubin Urine: NEGATIVE
Glucose, UA: NEGATIVE
Ketones, ur: NEGATIVE
Nitrite: NEGATIVE
Protein, ur: NEGATIVE
Specific Gravity, Urine: 1.01
Urobilinogen, UA: 0.2
pH: 6

## 2011-09-14 LAB — URINE CULTURE
Colony Count: NO GROWTH
Culture: NO GROWTH
Special Requests: POSITIVE

## 2011-09-14 LAB — CBC
HCT: 23.7 — ABNORMAL LOW
HCT: 29.2 — ABNORMAL LOW
Hemoglobin: 7.7 — CL
Hemoglobin: 9.4 — ABNORMAL LOW
MCHC: 32.1
MCHC: 32.4
MCV: 87.5
MCV: 88
Platelets: 221
Platelets: 270
RBC: 2.7 — ABNORMAL LOW
RBC: 3.34 — ABNORMAL LOW
RDW: 15.1
RDW: 15.2
WBC: 10.7 — ABNORMAL HIGH
WBC: 14.4 — ABNORMAL HIGH

## 2011-09-14 LAB — URINE MICROSCOPIC-ADD ON

## 2011-09-14 LAB — RPR: RPR Ser Ql: NONREACTIVE

## 2011-09-14 LAB — ABO/RH: ABO/RH(D): O POS

## 2011-09-21 LAB — URINALYSIS, ROUTINE W REFLEX MICROSCOPIC
Bilirubin Urine: NEGATIVE
Glucose, UA: NEGATIVE
Hgb urine dipstick: NEGATIVE
Ketones, ur: NEGATIVE
Nitrite: NEGATIVE
Protein, ur: NEGATIVE
Specific Gravity, Urine: 1.006
Urobilinogen, UA: 0.2
pH: 7

## 2011-09-21 LAB — BASIC METABOLIC PANEL
BUN: 3 — ABNORMAL LOW
CO2: 26
Calcium: 8.8
Chloride: 105
Creatinine, Ser: 0.7
GFR calc Af Amer: >60
GFR calc non Af Amer: >60
Glucose, Bld: 95
Potassium: 3.9
Sodium: 139

## 2011-09-21 LAB — DIFFERENTIAL
Basophils Absolute: 0
Basophils Relative: 0
Eosinophils Absolute: 0.1
Eosinophils Relative: 1
Lymphocytes Relative: 25
Lymphs Abs: 1.8
Monocytes Absolute: 0.4
Monocytes Relative: 6
Neutro Abs: 5.1
Neutrophils Relative %: 69

## 2011-09-21 LAB — CBC
HCT: 32.7 — ABNORMAL LOW
Hemoglobin: 10.9 — ABNORMAL LOW
MCHC: 33.2
MCV: 90.9
Platelets: 294
RBC: 3.6 — ABNORMAL LOW
RDW: 14.5 — ABNORMAL HIGH
WBC: 7.4

## 2011-09-21 LAB — D-DIMER, QUANTITATIVE: D-Dimer, Quant: 1.41 — ABNORMAL HIGH

## 2011-09-21 LAB — PREGNANCY, URINE: Preg Test, Ur: NEGATIVE

## 2011-09-29 LAB — POCT PREGNANCY, URINE
Operator id: 239701
Preg Test, Ur: NEGATIVE

## 2011-10-01 ENCOUNTER — Emergency Department (HOSPITAL_COMMUNITY)
Admission: EM | Admit: 2011-10-01 | Discharge: 2011-10-02 | Disposition: A | Discharge status: Home / Self Care | Payer: Medicaid Other | Attending: Emergency Medicine | Admitting: Emergency Medicine

## 2011-10-01 DIAGNOSIS — N898 Other specified noninflammatory disorders of vagina: Secondary | ICD-10-CM | POA: Insufficient documentation

## 2011-10-01 DIAGNOSIS — R109 Unspecified abdominal pain: Secondary | ICD-10-CM | POA: Insufficient documentation

## 2011-10-01 DIAGNOSIS — D649 Anemia, unspecified: Secondary | ICD-10-CM | POA: Insufficient documentation

## 2011-10-01 LAB — URINALYSIS, ROUTINE W REFLEX MICROSCOPIC
Bilirubin Urine: NEGATIVE
Glucose, UA: NEGATIVE mg/dL
Ketones, ur: 15 mg/dL — AB
Nitrite: NEGATIVE
Protein, ur: 100 mg/dL — AB
Specific Gravity, Urine: 1.027 (ref 1.005–1.030)
Urobilinogen, UA: 1 mg/dL (ref 0.0–1.0)
pH: 6.5 (ref 5.0–8.0)

## 2011-10-01 LAB — URINE MICROSCOPIC-ADD ON

## 2011-10-01 LAB — PREGNANCY, URINE: Preg Test, Ur: NEGATIVE

## 2011-10-02 LAB — DIFFERENTIAL
Basophils Absolute: 0 10*3/uL (ref 0.0–0.1)
Basophils Relative: 0 % (ref 0–1)
Eosinophils Absolute: 0.1 10*3/uL (ref 0.0–0.7)
Eosinophils Relative: 1 % (ref 0–5)
Lymphocytes Relative: 45 % (ref 12–46)
Lymphs Abs: 2.7 10*3/uL (ref 0.7–4.0)
Monocytes Absolute: 0.6 10*3/uL (ref 0.1–1.0)
Monocytes Relative: 10 % (ref 3–12)
Neutro Abs: 2.6 10*3/uL (ref 1.7–7.7)
Neutrophils Relative %: 43 % (ref 43–77)

## 2011-10-02 LAB — CBC
HCT: 27.7 % — ABNORMAL LOW (ref 36.0–46.0)
Hemoglobin: 8.9 g/dL — ABNORMAL LOW (ref 12.0–15.0)
MCH: 26.6 pg (ref 26.0–34.0)
MCHC: 32.1 g/dL (ref 30.0–36.0)
MCV: 82.7 fL (ref 78.0–100.0)
Platelets: 213 10*3/uL (ref 150–400)
RBC: 3.35 MIL/uL — ABNORMAL LOW (ref 3.87–5.11)
RDW: 15.8 % — ABNORMAL HIGH (ref 11.5–15.5)
WBC: 5.9 10*3/uL (ref 4.0–10.5)

## 2011-10-20 ENCOUNTER — Ambulatory Visit (HOSPITAL_COMMUNITY): Payer: Medicaid Other | Attending: Obstetrics

## 2012-06-11 ENCOUNTER — Ambulatory Visit (HOSPITAL_COMMUNITY)
Admission: RE | Admit: 2012-06-11 | Discharge: 2012-06-11 | Disposition: A | Discharge status: Home / Self Care | Payer: Medicaid Other | Source: Ambulatory Visit | Attending: Obstetrics | Admitting: Obstetrics

## 2012-06-11 DIAGNOSIS — Z1231 Encounter for screening mammogram for malignant neoplasm of breast: Secondary | ICD-10-CM | POA: Insufficient documentation

## 2012-07-03 ENCOUNTER — Emergency Department (HOSPITAL_COMMUNITY)
Admission: EM | Admit: 2012-07-03 | Discharge: 2012-07-04 | Disposition: A | Discharge status: Home / Self Care | Payer: Medicaid Other | Attending: Emergency Medicine | Admitting: Emergency Medicine

## 2012-07-03 ENCOUNTER — Encounter (HOSPITAL_COMMUNITY): Payer: Self-pay | Admitting: Emergency Medicine

## 2012-07-03 DIAGNOSIS — F10929 Alcohol use, unspecified with intoxication, unspecified: Secondary | ICD-10-CM

## 2012-07-03 DIAGNOSIS — F1010 Alcohol abuse, uncomplicated: Secondary | ICD-10-CM | POA: Insufficient documentation

## 2012-07-03 DIAGNOSIS — R45851 Suicidal ideations: Secondary | ICD-10-CM

## 2012-07-03 LAB — RAPID URINE DRUG SCREEN, HOSP PERFORMED
Amphetamines: NOT DETECTED
Barbiturates: NOT DETECTED
Benzodiazepines: NOT DETECTED
Cocaine: NOT DETECTED
Opiates: NOT DETECTED
Tetrahydrocannabinol: NOT DETECTED

## 2012-07-03 LAB — CBC
HCT: 36.4 % (ref 36.0–46.0)
Hemoglobin: 12.1 g/dL (ref 12.0–15.0)
MCH: 29.3 pg (ref 26.0–34.0)
MCHC: 33.2 g/dL (ref 30.0–36.0)
MCV: 88.1 fL (ref 78.0–100.0)
Platelets: 262 10*3/uL (ref 150–400)
RBC: 4.13 MIL/uL (ref 3.87–5.11)
RDW: 14.6 % (ref 11.5–15.5)
WBC: 15.5 10*3/uL — ABNORMAL HIGH (ref 4.0–10.5)

## 2012-07-03 LAB — PREGNANCY, URINE: Preg Test, Ur: NEGATIVE

## 2012-07-03 LAB — ETHANOL: Alcohol, Ethyl (B): 143 mg/dL — ABNORMAL HIGH (ref 0–11)

## 2012-07-03 MED ORDER — LORAZEPAM 1 MG PO TABS: 1.0000 mg | ORAL_TABLET | Freq: Three times a day (TID) | ORAL | Status: DC | PRN

## 2012-07-03 MED ORDER — NICOTINE 21 MG/24HR TD PT24
21.0000 mg | MEDICATED_PATCH | Freq: Once | TRANSDERMAL | Status: DC
  Administered 2012-07-03: 21 mg via TRANSDERMAL
  Filled 2012-07-03: qty 1

## 2012-07-03 MED ORDER — ONDANSETRON HCL 4 MG/2ML IJ SOLN
4.0000 mg | Freq: Once | INTRAMUSCULAR | Status: AC
  Administered 2012-07-03: 4 mg via INTRAVENOUS

## 2012-07-03 MED ORDER — ONDANSETRON HCL 8 MG PO TABS: 4.0000 mg | ORAL_TABLET | Freq: Three times a day (TID) | ORAL | Status: DC | PRN

## 2012-07-03 MED ORDER — SODIUM CHLORIDE 0.9 % IV SOLN
Freq: Once | INTRAVENOUS | Status: AC
  Administered 2012-07-03: 22:00:00 via INTRAVENOUS

## 2012-07-03 MED ORDER — ACETAMINOPHEN 325 MG PO TABS: 650.0000 mg | ORAL_TABLET | ORAL | Status: DC | PRN

## 2012-07-03 NOTE — ED Notes (Signed)
The patient says she took 1/2 of a Percocet 7.5-500 mg pill today.

## 2012-07-03 NOTE — ED Notes (Signed)
Advised Dr. Oletta Lamas that the patient is expressing suicidal ideations.  I advised that she believes her son is trying to call her to heaven.  The patient stated that she wants to die so she can be with her son.  Per Dr. Oletta Lamas, we are initiating SI precautions.

## 2012-07-03 NOTE — ED Notes (Signed)
House coverage advised sitter is needed.

## 2012-07-03 NOTE — ED Notes (Signed)
Security is en rout to wand the patient.  Mayra Reel, RN is helping the patient change into her paper scrubs.

## 2012-07-03 NOTE — ED Notes (Signed)
The patient was brought to the front door by her family.  Upon arrival, she was unresponsive, and she had shallow respirations.  The patient responded to sternal rubs.  Per her family, the patient drank copious amounts of alcohol.  Street drug usage is unknown.  Per the charge RN, the patient's son committed suicide last week.

## 2012-07-03 NOTE — ED Provider Notes (Addendum)
History     CSN: [id number]  Arrival date & time 07/03/12  2143   First MD Initiated Contact with Patient 07/03/12 2145      Chief Complaint  Patient presents with  . Altered Mental Status    (Consider location/radiation/quality/duration/timing/severity/associated sxs/prior treatment) HPI Comments: Level 5 caveat due to altered mentation.  Pt brought by family with limited responsiveness.  Pt does admit to drinking alcohol.  Pills found on her, reports taking a small amount of percocet and xanax.  Pt later admitted to RN that pt is contemplating suicide because her son died recently and she wants to "join him."  She will not answer me in terms of if she took an intentional overdose.  Pt has no sig PMH.    Patient is a 36 y.o. Compton presenting with altered mental status. The history is provided by the patient and a relative.  Altered Mental Status    History reviewed. No pertinent past medical history.  No past surgical history on file.  No family history on file.  History  Substance Use Topics  . Smoking status: Not on file  . Smokeless tobacco: Not on file  . Alcohol Use: Not on file    OB History    Grav Para Term Preterm Abortions TAB SAB Ect Mult Living                  Review of Systems  Unable to perform ROS: Psychiatric disorder  Psychiatric/Behavioral: Positive for altered mental status.    Allergies  Review of patient's allergies indicates no known allergies.  Home Medications   Current Outpatient Rx  Name Route Sig Dispense Refill  . ALPRAZOLAM 0.5 MG PO TABS Oral Take 0.5 mg by mouth Twice daily as needed. For anxiety    . OXYCODONE-ACETAMINOPHEN 7.5-500 MG PO TABS Oral Take 0.5-1 tablets by mouth every 4 (four) hours as needed. For pain      BP 115/60  Pulse 84  Temp 98.4 F (36.9 C) (Oral)  Resp 17  Ht 5\' 4"  (1.626 m)  Wt 153 lb (Megan.4 kg)  BMI 26.26 kg/m2  SpO2 98%  LMP 05/29/2012  Physical Exam  Nursing note and vitals  reviewed. Constitutional: She appears well-developed and well-nourished. She appears listless.  Non-toxic appearance.  HENT:  Head: Normocephalic and atraumatic.  Neck: Normal range of motion. Neck supple.  Cardiovascular: Normal rate.   Pulmonary/Chest: Effort normal. She has no wheezes. She has no rales.  Abdominal: Soft.  Musculoskeletal: Normal range of motion.       No obv deformities  Neurological: She appears listless. She displays no atrophy. No cranial nerve deficit. She exhibits normal muscle tone.       Moves all 4 extremities with intention  Skin: Skin is warm and dry. She is not diaphoretic.  Psychiatric: Her affect is labile. Her speech is delayed. She is withdrawn.       uncooperative    ED Course  Procedures (including critical care time)  Labs Reviewed  ETHANOL - Abnormal; Notable for the following:    Alcohol, Ethyl (B) 143 (*)     All other components within normal limits  CBC - Abnormal; Notable for the following:    WBC 15.5 (*)     All other components within normal limits  COMPREHENSIVE METABOLIC PANEL - Abnormal; Notable for the following:    Potassium 2.8 (*)     CO2 17 (*)     Glucose, Bld 110 (*)  BUN 4 (*)     All other components within normal limits  SALICYLATE LEVEL - Abnormal; Notable for the following:    Salicylate Lvl <2.0 (*)     All other components within normal limits  PREGNANCY, URINE  ACETAMINOPHEN LEVEL  URINE RAPID DRUG SCREEN (HOSP PERFORMED)   No results found.   1. Alcohol intoxication   2. Suicidal thoughts       MDM  Pt with acute alcohol intoxication, vomiting.  Pt was initially poorly responsive, although breathing well, hemodynamcially intact.  Pt had son commit suicide 3 weeks ago, has had visual hallucinations, voiced wanting to "join him" to RN which she admits to having said.  Will consult ACT and Telepsych.  Signed out to Dr. Verl Bangs.  Will replace K+.  Family is at bedside and are supportive.  She is  medically cleared after K+ is given.          Gavin Pound. Oletta Lamas, MD 07/04/12 1610  Gavin Pound. Oletta Lamas, MD 07/04/12 0111  Gavin Pound. Oletta Lamas, MD 08/06/12 [phone]

## 2012-07-04 LAB — COMPREHENSIVE METABOLIC PANEL
ALT: 10 U/L (ref 0–35)
AST: 14 U/L (ref 0–37)
Albumin: 4.1 g/dL (ref 3.5–5.2)
Alkaline Phosphatase: 79 U/L (ref 39–117)
BUN: 4 mg/dL — ABNORMAL LOW (ref 6–23)
CO2: 17 mEq/L — ABNORMAL LOW (ref 19–32)
Calcium: 9.5 mg/dL (ref 8.4–10.5)
Chloride: 105 mEq/L (ref 96–112)
Creatinine, Ser: 0.74 mg/dL (ref 0.50–1.10)
GFR calc Af Amer: >90 mL/min (ref >90)
GFR calc non Af Amer: >90 mL/min (ref >90)
Glucose, Bld: 110 mg/dL — ABNORMAL HIGH (ref 70–99)
Potassium: 2.8 mEq/L — ABNORMAL LOW (ref 3.5–5.1)
Sodium: 139 mEq/L (ref 135–145)
Total Bilirubin: 0.3 mg/dL (ref 0.3–1.2)
Total Protein: 8.3 g/dL (ref 6.0–8.3)

## 2012-07-04 LAB — ACETAMINOPHEN LEVEL: Acetaminophen (Tylenol), Serum: <15 ug/mL (ref 10–30)

## 2012-07-04 LAB — SALICYLATE LEVEL: Salicylate Lvl: <2 mg/dL — ABNORMAL LOW (ref 2.8–20.0)

## 2012-07-04 MED ORDER — POTASSIUM CHLORIDE CRYS ER 20 MEQ PO TBCR
40.0000 meq | EXTENDED_RELEASE_TABLET | Freq: Once | ORAL | Status: AC
  Administered 2012-07-04: 40 meq via ORAL
  Filled 2012-07-04: qty 2

## 2012-07-04 NOTE — BH Assessment (Signed)
Assessment Note   [name] is an 36 y.o. female.  Pt was brought to [facility] by friends after she had made a statement about hearing her dead son speaking to her.  Patient's 75 year old son committed suicide a week ago.  Tonight patient had been drinking.  When inebriated she told friends that she could hear her son telling her to join him.  Patient said that she did not want to "join him," because she does not want to die.  She said that her friends had thought she may try to harm herself.  Patient reports that she has 3 other children in the home.  Pt also denies any HI or visual hallucinations.  She said that this was the result of drinking too much.  She also reports that she only drinks 1-2 times per month anyway.  Dr. Oletta Lamas had ordered a telepsych.  Dr. Jacky Kindle [facility]) had talked with patient and said that it was okay to discharge her.  This clinician did give her resources for therapists in the [place] area.  Patient discharged home. Axis I: Bereavement Axis II: Deferred Axis III: History reviewed. No pertinent past medical history. Axis IV: other psychosocial or environmental problems Axis V: 41-50 serious symptoms  Past Medical History: History reviewed. No pertinent past medical history.  No past surgical history on file.  Family History: No family history on file.  Social History:  does not have a smoking history on file. She does not have any smokeless tobacco history on file. Her alcohol and drug histories not on file.  Additional Social History:  Alcohol / Drug Use Pain Medications: None Prescriptions: Xanax .5 mg prn Over the Counter: N/A History of alcohol / drug use?: Yes Substance #1 Name of Substance 1: ETOH 1 - Age of First Use: Teens 1 - Amount (size/oz): Drinks a few drinks once or twice per month 1 - Frequency: 1-2 x/M 1 - Duration: Ongoing 1 - Last Use / Amount: 07/23 Unknown amount  CIWA: CIWA-Ar BP: 112/62 mmHg Pulse Rate: 78  COWS:    Allergies: No  Known Allergies  Home Medications:  (Not in a hospital admission)  OB/GYN Status:  Patient's last menstrual period was 05/29/2012.  General Assessment Data Location of Assessment: [facility] ED Living Arrangements: Children Can pt return to current living arrangement?: Yes Admission Status: Voluntary Is patient capable of signing voluntary admission?: Yes Transfer from: Acute Hospital Referral Source: Self/Family/Friend     Risk to self Suicidal Ideation: No Suicidal Intent: No Is patient at risk for suicide?: No Suicidal Plan?: No Access to Means: No What has been your use of drugs/alcohol within the last 12 months?: Drank mixed drinks last night Previous Attempts/Gestures: No How many times?: 0  Other Self Harm Risks: None Triggers for Past Attempts: None known Intentional Self Injurious Behavior: None Family Suicide History: Yes (79 year old son committed suicide last week) Recent stressful life event(s): Loss (Comment) (Son committed suicide last week) Persecutory voices/beliefs?: No Depression: Yes Depression Symptoms: Despondent;Insomnia;Loss of interest in usual pleasures Substance abuse history and/or treatment for substance abuse?: No Suicide prevention information given to non-admitted patients: Not applicable  Risk to Others Homicidal Ideation: No Thoughts of Harm to Others: No Current Homicidal Intent: No Current Homicidal Plan: No Access to Homicidal Means: No Identified Victim: No one History of harm to others?: No Assessment of Violence: None Noted Violent Behavior Description: None Does patient have access to weapons?: No Criminal Charges Pending?: No Does patient have  a court date: No  Psychosis Hallucinations: Auditory (when inebriated) Delusions: None noted  Mental Status Report Appear/Hygiene:  (Casual) Eye Contact: Good Motor Activity: Freedom of movement;Unremarkable Speech: Logical/coherent Level of Consciousness: Quiet/awake Mood:  Depressed;Sad Affect: Blunted;Sad Anxiety Level: Moderate Thought Processes: Coherent;Relevant Judgement: Unimpaired Orientation: Person;Place;Time;Situation Obsessive Compulsive Thoughts/Behaviors: None  Cognitive Functioning Concentration: Decreased Memory: Recent Impaired;Remote Intact IQ: Average Insight: Fair Impulse Control: Fair Appetite: Poor Weight Loss: 0  Weight Gain: 0  Sleep: Decreased Total Hours of Sleep:  (<4H/D over last week) Vegetative Symptoms: None  ADLScreening [facility] Assessment Services) Patient's cognitive ability adequate to safely complete daily activities?: Yes Patient able to express need for assistance with ADLs?: Yes Independently performs ADLs?: Yes  Abuse/Neglect [facility]) Physical Abuse: Denies Verbal Abuse: Denies Sexual Abuse: Denies  Prior Inpatient Therapy Prior Inpatient Therapy: No Prior Therapy Dates: None Prior Therapy Facilty/Provider(s): None Reason for Treatment: None  Prior Outpatient Therapy Prior Outpatient Therapy: No Prior Therapy Dates: None Prior Therapy Facilty/Provider(s): None Reason for Treatment: None  ADL Screening (condition at time of admission) Patient's cognitive ability adequate to safely complete daily activities?: Yes Patient able to express need for assistance with ADLs?: Yes Independently performs ADLs?: Yes Weakness of Legs: None Weakness of Arms/Hands: None  Home Assistive Devices/Equipment Home Assistive Devices/Equipment: None    Abuse/Neglect Assessment (Assessment to be complete while patient is alone) Physical Abuse: Denies Verbal Abuse: Denies Sexual Abuse: Denies Exploitation of patient/patient's resources: Denies Self-Neglect: Denies Values / Beliefs Cultural Requests During Hospitalization: None Spiritual Requests During Hospitalization: None   Advance Directives (For Healthcare) Advance Directive: Patient does not have advance directive;Patient would not like information Nutrition  Screen Problems chewing or swallowing foods and/or liquids: No Home Tube Feeding or Total Parenteral Nutrition (TPN): No Patient appears severely malnourished: No Pregnant or Lactating: No  Additional Information 1:1 In Past 12 Months?: No CIRT Risk: No Elopement Risk: No Does patient have medical clearance?: Yes     Disposition:  Disposition Disposition of Patient: Outpatient treatment;Referred to Type of outpatient treatment: Adult Patient referred to:  (REferral information given)  On Site Evaluation by:   Reviewed with Physician:  Dr. Helyn App, Claris Gladden 07/04/2012 5:19 AM
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
## 2012-07-04 NOTE — ED Notes (Signed)
Spoke with Pod C RN.  The mental health area is full at this time.

## 2012-07-04 NOTE — ED Notes (Signed)
The patient is AOx4 and comfortable with her discharge instructions. 

## 2012-07-05 ENCOUNTER — Telehealth (HOSPITAL_COMMUNITY): Payer: Self-pay | Admitting: Licensed Clinical Social Worker

## 2012-07-09 LAB — GLUCOSE, CAPILLARY: Glucose-Capillary: 110 mg/dL — ABNORMAL HIGH (ref 70–99)

## 2012-09-05 NOTE — ED Provider Notes (Signed)
History     CSN: [id number]  Arrival date & time 07/03/12  2143   First MD Initiated Contact with Patient 07/03/12 2145      Chief Complaint  Patient presents with  . Altered Mental Status    (Consider location/radiation/quality/duration/timing/severity/associated sxs/prior treatment) HPI  History reviewed. No pertinent past medical history.  No past surgical history on file.  No family history on file.  History  Substance Use Topics  . Smoking status: Not on file  . Smokeless tobacco: Not on file  . Alcohol Use: Not on file    OB History    Grav Para Term Preterm Abortions TAB SAB Ect Mult Living                  Review of Systems  Allergies  Review of patient's allergies indicates no known allergies.  Home Medications   Current Outpatient Rx  Name Route Sig Dispense Refill  . ALPRAZOLAM 0.5 MG PO TABS Oral Take 0.5 mg by mouth Twice daily as needed. For anxiety    . OXYCODONE-ACETAMINOPHEN 7.5-500 MG PO TABS Oral Take 0.5-1 tablets by mouth every 4 (four) hours as needed. For pain      BP 112/62  Pulse 78  Temp 98.5 F (36.9 C) (Oral)  Resp 16  Ht 5\' 4"  (1.626 m)  Wt 153 lb (69.4 kg)  BMI 26.26 kg/m2  SpO2 99%  LMP 05/29/2012  Physical Exam  ED Course  Procedures (including critical care time)  Labs Reviewed  ETHANOL - Abnormal; Notable for the following:    Alcohol, Ethyl (B) 143 (*)     All other components within normal limits  CBC - Abnormal; Notable for the following:    WBC 15.5 (*)     All other components within normal limits  COMPREHENSIVE METABOLIC PANEL - Abnormal; Notable for the following:    Potassium 2.8 (*)     CO2 17 (*)     Glucose, Bld 110 (*)     BUN 4 (*)     All other components within normal limits  SALICYLATE LEVEL - Abnormal; Notable for the following:    Salicylate Lvl <2.0 (*)     All other components within normal limits  GLUCOSE, CAPILLARY - Abnormal; Notable for the following:    Glucose-Capillary 110  (*)     All other components within normal limits  PREGNANCY, URINE  ACETAMINOPHEN LEVEL  URINE RAPID DRUG SCREEN (HOSP PERFORMED)  LAB REPORT - SCANNED   No results found.   1. Alcohol intoxication   2. Suicidal thoughts       MDM   Date: 07/03/2012  Rate: 84  Rhythm: normal sinus rhythm  QRS Axis: normal  Intervals: normal  ST/T Wave abnormalities: normal  Conduction Disutrbances: none  Narrative Interpretation: unremarkable           Wilmot Quevedo Lytle Michaels, MD 09/05/12 2035

## 2012-09-27 ENCOUNTER — Encounter (HOSPITAL_COMMUNITY): Payer: Self-pay | Admitting: *Deleted

## 2012-09-27 ENCOUNTER — Emergency Department (INDEPENDENT_AMBULATORY_CARE_PROVIDER_SITE_OTHER)
Admission: EM | Admit: 2012-09-27 | Discharge: 2012-09-27 | Disposition: A | Discharge status: Home / Self Care | Payer: Medicaid Other | Source: Home / Self Care

## 2012-09-27 DIAGNOSIS — S29012A Strain of muscle and tendon of back wall of thorax, initial encounter: Secondary | ICD-10-CM

## 2012-09-27 DIAGNOSIS — S239XXA Sprain of unspecified parts of thorax, initial encounter: Secondary | ICD-10-CM

## 2012-09-27 MED ORDER — CYCLOBENZAPRINE HCL 5 MG PO TABS: 5.0000 mg | ORAL_TABLET | Freq: Two times a day (BID) | ORAL | Status: DC | PRN

## 2012-09-27 MED ORDER — NAPROXEN 500 MG PO TABS: 500.0000 mg | ORAL_TABLET | Freq: Two times a day (BID) | ORAL | Status: DC

## 2012-09-27 NOTE — ED Provider Notes (Signed)
History     CSN: [id number]  Arrival date & time 09/27/12  1726   None     No chief complaint on file.   (Consider location/radiation/quality/duration/timing/severity/associated sxs/prior treatment) HPI Comments: 36 year old female presents with 2 days of pain in her upper left back. It started 2 days ago when she was taking sheets under a bed. She works as a Advertising copywriter. The pain is located in the rhomboid musculature and musculature surrounding the left scapula. She denies mid back pain, lumbar pain or spinal pain. Pain is worse with certain movements such as changing gears of her car with her right arm.   No past medical history on file.  No past surgical history on file.  No family history on file.  History  Substance Use Topics  . Smoking status: Not on file  . Smokeless tobacco: Not on file  . Alcohol Use: Not on file    OB History    Grav Para Term Preterm Abortions TAB SAB Ect Mult Living                  Review of Systems  Constitutional: Negative for fever, chills and activity change.  HENT: Negative.   Respiratory: Negative.   Cardiovascular: Negative.   Musculoskeletal:       As per HPI  Skin: Negative for color change, pallor and rash.  Neurological: Negative.     Allergies  Review of patient's allergies indicates no known allergies.  Home Medications   Current Outpatient Rx  Name Route Sig Dispense Refill  . ALPRAZOLAM 0.5 MG PO TABS Oral Take 0.5 mg by mouth Twice daily as needed. For anxiety    . CYCLOBENZAPRINE HCL 5 MG PO TABS Oral Take 1 tablet (5 mg total) by mouth 2 (two) times daily as needed for muscle spasms. 16 tablet 0  . NAPROXEN 500 MG PO TABS Oral Take 1 tablet (500 mg total) by mouth 2 (two) times daily. 20 tablet 0  . OXYCODONE-ACETAMINOPHEN 7.5-500 MG PO TABS Oral Take 0.5-1 tablets by mouth every 4 (four) hours as needed. For pain      BP 110/56  Pulse 66  Temp 98.4 F (36.9 C) (Oral)  Resp 18  SpO2 100%  Physical  Exam  Constitutional: She is oriented to person, place, and time. She appears well-developed and well-nourished. No distress.  HENT:  Head: Normocephalic and atraumatic.  Eyes: EOM are normal. Pupils are equal, round, and reactive to light.  Neck: Normal range of motion. Neck supple.  Musculoskeletal:       Tenderness over the supraspinatus scapular muscles as well as the rhomboid. Flexing her neck causes pain in the rhomboid. Full range of motion of the shoulder and no tenderness in the shoulder joint. No pain or tenderness of the neck, no pain or tenderness in the lumbar spine or musculature.  Lymphadenopathy:    She has no cervical adenopathy.  Neurological: She is alert and oriented to person, place, and time. No cranial nerve deficit.  Skin: Skin is warm and dry.    ED Course  Procedures (including critical care time)  Labs Reviewed - No data to display No results found.   1. Upper back strain       MDM  Apply heat such as determined care wraps to the areas of pain every day. Limit activity that exacerbates pain. Flexeril 5 mg up to twice a day when necessary muscle spasms. Naprosyn 500 mg twice a day p.c. when necessary  Hayden Rasmussen, NP 09/27/12 1842

## 2012-09-27 NOTE — ED Notes (Signed)
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 C/o pain L posterior shoulder/trapezius pain onset 2 days ago.  Hurts to lay head to the right or bend arm down and back.

## 2012-09-28 NOTE — ED Provider Notes (Signed)
Medical screening examination/treatment/procedure(s) were performed by non-physician practitioner and as supervising physician I was immediately available for consultation/collaboration.  Leslee Home, M.D.   Reuben Likes, MD 09/28/12 [phone]

## 2013-08-21 ENCOUNTER — Encounter (HOSPITAL_COMMUNITY): Payer: Self-pay

## 2013-08-21 ENCOUNTER — Emergency Department (HOSPITAL_COMMUNITY)
Admission: EM | Admit: 2013-08-21 | Discharge: 2013-08-22 | Disposition: A | Discharge status: Home / Self Care | Payer: Medicaid Other | Attending: Emergency Medicine | Admitting: Emergency Medicine

## 2013-08-21 DIAGNOSIS — K089 Disorder of teeth and supporting structures, unspecified: Secondary | ICD-10-CM | POA: Insufficient documentation

## 2013-08-21 DIAGNOSIS — Z79899 Other long term (current) drug therapy: Secondary | ICD-10-CM | POA: Insufficient documentation

## 2013-08-21 DIAGNOSIS — Z792 Long term (current) use of antibiotics: Secondary | ICD-10-CM | POA: Insufficient documentation

## 2013-08-21 DIAGNOSIS — F17200 Nicotine dependence, unspecified, uncomplicated: Secondary | ICD-10-CM | POA: Insufficient documentation

## 2013-08-21 DIAGNOSIS — Z791 Long term (current) use of non-steroidal anti-inflammatories (NSAID): Secondary | ICD-10-CM | POA: Insufficient documentation

## 2013-08-21 DIAGNOSIS — K0889 Other specified disorders of teeth and supporting structures: Secondary | ICD-10-CM

## 2013-08-21 MED ORDER — KETOROLAC TROMETHAMINE 60 MG/2ML IM SOLN
60.0000 mg | Freq: Once | INTRAMUSCULAR | Status: AC
  Administered 2013-08-21: 60 mg via INTRAMUSCULAR
  Filled 2013-08-21: qty 2

## 2013-08-21 MED ORDER — OXYCODONE-ACETAMINOPHEN 5-325 MG PO TABS
2.0000 | ORAL_TABLET | Freq: Once | ORAL | Status: AC
  Administered 2013-08-21: 2 via ORAL
  Filled 2013-08-21: qty 2

## 2013-08-21 MED ORDER — PENICILLIN V POTASSIUM 250 MG PO TABS
500.0000 mg | ORAL_TABLET | Freq: Four times a day (QID) | ORAL | Status: DC
  Administered 2013-08-21: 500 mg via ORAL
  Filled 2013-08-21: qty 2

## 2013-08-21 MED ORDER — PENICILLIN V POTASSIUM 500 MG PO TABS: 500.0000 mg | ORAL_TABLET | Freq: Four times a day (QID) | ORAL | Status: AC

## 2013-08-21 MED ORDER — IBUPROFEN 600 MG PO TABS: 600.0000 mg | ORAL_TABLET | Freq: Three times a day (TID) | ORAL | Status: DC | PRN

## 2013-08-21 MED ORDER — OXYCODONE-ACETAMINOPHEN 5-325 MG PO TABS: 1.0000 | ORAL_TABLET | ORAL | Status: DC | PRN

## 2013-08-21 NOTE — ED Notes (Signed)
Pt c/o Left side dental pain starting yesterday, unsure of which tooth is actually causing the pain.

## 2013-08-21 NOTE — ED Provider Notes (Signed)
CSN: [id number]     Arrival date & time 08/21/13  2300 History   First MD Initiated Contact with Patient 08/21/13 2324     Chief Complaint  Patient presents with  . Dental Pain    The history is provided by the patient.   reports left upper dental pain over the past 24 hours.  She states her tooth as aching.  She feels like her left side of her face is slightly swollen.  She denies difficulty breathing or swallowing.  She has a Education officer, community.  She's tried nothing for the pain.  She denies a foul taste in her mouth.  No other complaints.  Her pain is mild to moderate in severity this time.  History reviewed. No pertinent past medical history. Past Surgical History  Procedure Laterality Date  . Cesarean section    . Tubal ligation     History reviewed. No pertinent family history. History  Substance Use Topics  . Smoking status: Current Every Day Smoker -- 1.00 packs/day    Types: Cigarettes  . Smokeless tobacco: Not on file  . Alcohol Use: No   OB History   Grav Para Term Preterm Abortions TAB SAB Ect Mult Living                 Review of Systems  All other systems reviewed and are negative.    Allergies  Review of patient's allergies indicates no known allergies.  Home Medications   Current Outpatient Rx  Name  Route  Sig  Dispense  Refill  . cetirizine (ZYRTEC) 10 MG tablet   Oral   Take 10 mg by mouth daily as needed for allergies.         . naproxen (NAPROSYN) 500 MG tablet   Oral   Take 500 mg by mouth 2 (two) times daily as needed (pain).         Marland Kitchen ibuprofen (ADVIL,MOTRIN) 600 MG tablet   Oral   Take 1 tablet (600 mg total) by mouth every 8 (eight) hours as needed for pain.   15 tablet   0   . oxyCODONE-acetaminophen (PERCOCET/ROXICET) 5-325 MG per tablet   Oral   Take 1 tablet by mouth every 4 (four) hours as needed for pain.   15 tablet   0   . penicillin v potassium (VEETID) 500 MG tablet   Oral   Take 1 tablet (500 mg total) by mouth 4 (four)  times daily.   40 tablet   0    BP 130/88  Pulse 81  Temp(Src) 99 F (37.2 C) (Oral)  Resp 18  Ht 5\' 4"  (1.626 m)  Wt 180 lb (81.647 kg)  BMI 30.88 kg/m2  SpO2 100%  LMP 08/21/2013 Physical Exam  Nursing note and vitals reviewed. Constitutional: She is oriented to person, place, and time. She appears well-developed and well-nourished. No distress.  HENT:  Head: Normocephalic and atraumatic.  Left upper first molar with evidence of dental decay.  No gingival swelling or fluctuance.  Tolerating secretions.  Oral airway is patent.  Eyes: EOM are normal.  Neck: Normal range of motion.  Cardiovascular: Normal rate, regular rhythm and normal heart sounds.   Pulmonary/Chest: Effort normal.  Musculoskeletal: Normal range of motion.  Neurological: She is alert and oriented to person, place, and time.  Skin: Skin is warm and dry.  Psychiatric: She has a normal mood and affect. Judgment normal.    ED Course  Procedures (including critical care time) Labs Review Labs  Reviewed - No data to display Imaging Review No results found.  MDM   1. Pain, dental    Dental Pain. Home with antibiotics and pain medicine. Recommend dental follow up. No signs of gingival abscess. Tolerating secretions. Airway patent. No sub lingular swelling     Lyanne Co, MD 08/21/13 [phone]

## 2013-11-02 ENCOUNTER — Emergency Department (HOSPITAL_COMMUNITY): Payer: Medicaid Other

## 2013-11-02 ENCOUNTER — Emergency Department (HOSPITAL_COMMUNITY)
Admission: EM | Admit: 2013-11-02 | Discharge: 2013-11-02 | Disposition: A | Discharge status: Home / Self Care | Payer: Medicaid Other | Attending: Emergency Medicine | Admitting: Emergency Medicine

## 2013-11-02 ENCOUNTER — Encounter (HOSPITAL_COMMUNITY): Payer: Self-pay | Admitting: Emergency Medicine

## 2013-11-02 DIAGNOSIS — F17200 Nicotine dependence, unspecified, uncomplicated: Secondary | ICD-10-CM | POA: Insufficient documentation

## 2013-11-02 DIAGNOSIS — S0083XA Contusion of other part of head, initial encounter: Secondary | ICD-10-CM | POA: Insufficient documentation

## 2013-11-02 DIAGNOSIS — S0990XA Unspecified injury of head, initial encounter: Secondary | ICD-10-CM

## 2013-11-02 DIAGNOSIS — R55 Syncope and collapse: Secondary | ICD-10-CM | POA: Insufficient documentation

## 2013-11-02 DIAGNOSIS — Z79899 Other long term (current) drug therapy: Secondary | ICD-10-CM | POA: Insufficient documentation

## 2013-11-02 DIAGNOSIS — S0003XA Contusion of scalp, initial encounter: Secondary | ICD-10-CM | POA: Insufficient documentation

## 2013-11-02 DIAGNOSIS — S060X9A Concussion with loss of consciousness of unspecified duration, initial encounter: Secondary | ICD-10-CM | POA: Insufficient documentation

## 2013-11-02 MED ORDER — MORPHINE SULFATE 4 MG/ML IJ SOLN
4.0000 mg | Freq: Once | INTRAMUSCULAR | Status: AC
  Administered 2013-11-02: 4 mg via INTRAMUSCULAR
  Filled 2013-11-02: qty 1

## 2013-11-02 MED ORDER — TRAMADOL HCL 50 MG PO TABS: 50.0000 mg | ORAL_TABLET | Freq: Four times a day (QID) | ORAL | Status: DC | PRN

## 2013-11-02 MED ORDER — IBUPROFEN 600 MG PO TABS: 600.0000 mg | ORAL_TABLET | Freq: Four times a day (QID) | ORAL | Status: DC | PRN

## 2013-11-02 NOTE — Discharge Instructions (Signed)
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
## 2013-12-28 ENCOUNTER — Other Ambulatory Visit (HOSPITAL_COMMUNITY)
Admission: RE | Admit: 2013-12-28 | Discharge: 2013-12-28 | Disposition: A | Discharge status: Home / Self Care | Payer: Medicaid Other | Source: Ambulatory Visit | Attending: Emergency Medicine | Admitting: Emergency Medicine

## 2013-12-28 ENCOUNTER — Emergency Department (HOSPITAL_COMMUNITY)
Admission: EM | Admit: 2013-12-28 | Discharge: 2013-12-28 | Disposition: A | Discharge status: Home / Self Care | Payer: Medicaid Other | Source: Home / Self Care | Attending: Emergency Medicine | Admitting: Emergency Medicine

## 2013-12-28 ENCOUNTER — Encounter (HOSPITAL_COMMUNITY): Payer: Self-pay | Admitting: Emergency Medicine

## 2013-12-28 DIAGNOSIS — Z113 Encounter for screening for infections with a predominantly sexual mode of transmission: Secondary | ICD-10-CM | POA: Insufficient documentation

## 2013-12-28 DIAGNOSIS — N760 Acute vaginitis: Secondary | ICD-10-CM | POA: Insufficient documentation

## 2013-12-28 DIAGNOSIS — R102 Pelvic and perineal pain: Secondary | ICD-10-CM

## 2013-12-28 DIAGNOSIS — N949 Unspecified condition associated with female genital organs and menstrual cycle: Secondary | ICD-10-CM

## 2013-12-28 LAB — POCT URINALYSIS DIP (DEVICE)
Bilirubin Urine: NEGATIVE
Glucose, UA: NEGATIVE mg/dL
Ketones, ur: NEGATIVE mg/dL
Leukocytes, UA: NEGATIVE
Nitrite: NEGATIVE
Protein, ur: NEGATIVE mg/dL
Specific Gravity, Urine: 1.02 (ref 1.005–1.030)
Urobilinogen, UA: 0.2 mg/dL (ref 0.0–1.0)
pH: 6 (ref 5.0–8.0)

## 2013-12-28 LAB — POCT PREGNANCY, URINE: Preg Test, Ur: NEGATIVE

## 2013-12-28 MED ORDER — LIDOCAINE HCL (PF) 1 % IJ SOLN
INTRAMUSCULAR | Status: AC
  Filled 2013-12-28: qty 5

## 2013-12-28 MED ORDER — CEFTRIAXONE SODIUM 250 MG IJ SOLR
INTRAMUSCULAR | Status: AC
  Filled 2013-12-28: qty 250

## 2013-12-28 MED ORDER — METRONIDAZOLE 500 MG PO TABS: 500.0000 mg | ORAL_TABLET | Freq: Two times a day (BID) | ORAL | Status: DC

## 2013-12-28 MED ORDER — IBUPROFEN 800 MG PO TABS
800.0000 mg | ORAL_TABLET | Freq: Once | ORAL | Status: AC
  Administered 2013-12-28: 800 mg via ORAL

## 2013-12-28 MED ORDER — IBUPROFEN 800 MG PO TABS
ORAL_TABLET | ORAL | Status: AC
  Filled 2013-12-28: qty 1

## 2013-12-28 MED ORDER — AZITHROMYCIN 250 MG PO TABS
1000.0000 mg | ORAL_TABLET | Freq: Once | ORAL | Status: AC
  Administered 2013-12-28: 1000 mg via ORAL

## 2013-12-28 MED ORDER — AZITHROMYCIN 250 MG PO TABS
ORAL_TABLET | ORAL | Status: AC
  Filled 2013-12-28: qty 4

## 2013-12-28 MED ORDER — CEFTRIAXONE SODIUM 250 MG IJ SOLR
250.0000 mg | Freq: Once | INTRAMUSCULAR | Status: AC
  Administered 2013-12-28: 250 mg via INTRAMUSCULAR

## 2013-12-28 MED ORDER — IBUPROFEN 800 MG PO TABS
800.0000 mg | ORAL_TABLET | Freq: Three times a day (TID) | ORAL | Status: DC
Start: 2013-12-28 — End: 2014-06-26

## 2013-12-28 MED ORDER — DOXYCYCLINE HYCLATE 100 MG PO TABS: 100.0000 mg | ORAL_TABLET | Freq: Two times a day (BID) | ORAL | Status: DC

## 2013-12-28 NOTE — ED Notes (Addendum)
Pt c/o constant lower abd pain/right flank pain onset 1 week Has noticed frequent urination but denies: dysuria, hematuria, gyn sxs, f/v/n/d, cold sxs Pain increases when pt lays down  Taking Oxycodone 10mg  her pain clinic Rx She is alert w/no signs of acute distress.
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
## 2013-12-28 NOTE — ED Provider Notes (Signed)
Chief Complaint:   Chief Complaint  Patient presents with  . Abdominal Pain    History of Present Illness:   [name] is a 38 year old female who's had a one-week history of pelvic pain, worse on the right than the left. This is worse if she lies flat and is rated 8/10 in intensity. It's been associated with nausea and fatigue. Last menstrual period was December 16. She spotted on January 2. She has had a tubal ligation in 2009. She is sexually active. She denies any fever, chills, vomiting, diarrhea, or urinary symptoms. She has not had any vaginal discharge or itching. No prior history of STDs or pelvic infections.  Review of Systems:  Other than noted above, the patient denies any of the following symptoms: Systemic:  No fever, chills, sweats, or weight loss. GI:  No abdominal pain, nausea, anorexia, vomiting, diarrhea, constipation, melena or hematochezia. GU:  No dysuria, frequency, urgency, hematuria, vaginal discharge, itching, or abnormal vaginal bleeding. Skin:  No rash or itching.  [name]:  Past medical history, family history, social history, meds, and allergies were reviewed.   Physical Exam:   Vital signs:  BP 128/75  Pulse 61  Temp(Src) 98.4 F (36.9 C) (Oral)  Resp 18  SpO2 100%  LMP 11/25/2013 General:  Alert, oriented and in no distress. Lungs:  Breath sounds clear and equal bilaterally.  No wheezes, rales or rhonchi. Heart:  Regular rhythm.  No gallops or murmers. Abdomen:  Soft, flat and non-distended.  No organomegaly or mass.  No tenderness, guarding or rebound.  Bowel sounds normally active. Pelvic exam:  Normal external genitalia, vaginal and cervical mucosa were normal. There was a scant amount of white discharge. She has moderate pain with cervical motion. Uterus is somewhat enlarged and irregular in shape consistent with fibroid tumors. This moderately tender to palpation. She has moderate bilateral adnexal tenderness to palpation right greater than left  without any masses. DNA probe for gonorrhea, Chlamydia, Trichomonas, Gardnerella, Candida were obtained. Skin:  Clear, warm and dry.  Labs:   Results for orders placed during the hospital encounter of 12/28/13  POCT URINALYSIS DIP (DEVICE)      Result Value Range   Glucose, UA NEGATIVE  NEGATIVE mg/dL   Bilirubin Urine NEGATIVE  NEGATIVE   Ketones, ur NEGATIVE  NEGATIVE mg/dL   Specific Gravity, Urine 1.020  1.005 - 1.030   Hgb urine dipstick TRACE (*) NEGATIVE   pH 6.0  5.0 - 8.0   Protein, ur NEGATIVE  NEGATIVE mg/dL   Urobilinogen, UA 0.2  0.0 - 1.0 mg/dL   Nitrite NEGATIVE  NEGATIVE   Leukocytes, UA NEGATIVE  NEGATIVE  POCT PREGNANCY, URINE      Result Value Range   Preg Test, Ur NEGATIVE  NEGATIVE    Course in Urgent Care Center:   She was given Rocephin 250 mg IM and azithromycin 1000 mg by mouth.  Assessment:  The encounter diagnosis was Pelvic pain.  Differential diagnosis includes PID, fibroid tumors, or ovarian cyst. Will need followup with gynecology.  Plan:   1.  Meds:  The following meds were prescribed:   Discharge Medication List as of 12/28/2013 11:30 AM    START taking these medications   Details  doxycycline (VIBRA-TABS) 100 MG tablet Take 1 tablet (100 mg total) by mouth 2 (two) times daily., Starting 12/28/2013, Until Discontinued, Normal    !! ibuprofen (ADVIL,MOTRIN) 800 MG tablet Take 1 tablet (800 mg total) by mouth 3 (three) times daily., Starting  12/28/2013, Until Discontinued, Normal    metroNIDAZOLE (FLAGYL) 500 MG tablet Take 1 tablet (500 mg total) by mouth 2 (two) times daily., Starting 12/28/2013, Until Discontinued, Normal     !! - Potential duplicate medications found. Please discuss with provider.      2.  Patient Education/Counseling:  The patient was given appropriate handouts, self care instructions, and instructed in symptomatic relief.  Advised no intercourse for the next one to 2 weeks.  3.  Follow up:  The patient was told to follow  up if no better in 3 to 4 days, if becoming worse in any way, and given some red flag symptoms such as worsening pain or fever which would prompt immediate return.  Follow up at [facility] in 1 week.     Harden Mo, MD 12/28/13 [phone]

## 2013-12-28 NOTE — ED Notes (Signed)
Call back number verified.

## 2013-12-30 NOTE — ED Notes (Signed)
GC/Chlamydia neg., Affirm: Candida and Trich neg., Gardnerella pos. Pt. adequately treated with Flagyl. Roselyn Meier 12/30/2013

## 2014-02-28 ENCOUNTER — Other Ambulatory Visit: Payer: Self-pay | Admitting: Internal Medicine

## 2014-02-28 DIAGNOSIS — N949 Unspecified condition associated with female genital organs and menstrual cycle: Secondary | ICD-10-CM

## 2014-03-13 ENCOUNTER — Ambulatory Visit
Admission: RE | Admit: 2014-03-13 | Discharge: 2014-03-13 | Disposition: A | Discharge status: Home / Self Care | Payer: Medicaid Other | Source: Ambulatory Visit | Attending: Internal Medicine | Admitting: Internal Medicine

## 2014-03-13 DIAGNOSIS — N949 Unspecified condition associated with female genital organs and menstrual cycle: Secondary | ICD-10-CM

## 2014-06-26 ENCOUNTER — Emergency Department (HOSPITAL_COMMUNITY)
Admission: EM | Admit: 2014-06-26 | Discharge: 2014-06-26 | Disposition: A | Discharge status: Home / Self Care | Payer: Medicaid Other | Attending: Emergency Medicine | Admitting: Emergency Medicine

## 2014-06-26 ENCOUNTER — Encounter (HOSPITAL_COMMUNITY): Payer: Self-pay | Admitting: Emergency Medicine

## 2014-06-26 ENCOUNTER — Emergency Department (HOSPITAL_COMMUNITY): Payer: Medicaid Other

## 2014-06-26 DIAGNOSIS — Z9889 Other specified postprocedural states: Secondary | ICD-10-CM | POA: Diagnosis not present

## 2014-06-26 DIAGNOSIS — F17200 Nicotine dependence, unspecified, uncomplicated: Secondary | ICD-10-CM | POA: Diagnosis not present

## 2014-06-26 DIAGNOSIS — Z3202 Encounter for pregnancy test, result negative: Secondary | ICD-10-CM | POA: Insufficient documentation

## 2014-06-26 DIAGNOSIS — Z79899 Other long term (current) drug therapy: Secondary | ICD-10-CM | POA: Insufficient documentation

## 2014-06-26 DIAGNOSIS — A499 Bacterial infection, unspecified: Secondary | ICD-10-CM | POA: Diagnosis not present

## 2014-06-26 DIAGNOSIS — N760 Acute vaginitis: Secondary | ICD-10-CM | POA: Diagnosis not present

## 2014-06-26 DIAGNOSIS — N390 Urinary tract infection, site not specified: Secondary | ICD-10-CM | POA: Diagnosis not present

## 2014-06-26 DIAGNOSIS — M25579 Pain in unspecified ankle and joints of unspecified foot: Secondary | ICD-10-CM | POA: Insufficient documentation

## 2014-06-26 DIAGNOSIS — B9689 Other specified bacterial agents as the cause of diseases classified elsewhere: Secondary | ICD-10-CM | POA: Insufficient documentation

## 2014-06-26 DIAGNOSIS — Z9851 Tubal ligation status: Secondary | ICD-10-CM | POA: Insufficient documentation

## 2014-06-26 DIAGNOSIS — R109 Unspecified abdominal pain: Secondary | ICD-10-CM | POA: Diagnosis present

## 2014-06-26 LAB — URINE MICROSCOPIC-ADD ON

## 2014-06-26 LAB — COMPREHENSIVE METABOLIC PANEL
ALT: 15 U/L (ref 0–35)
AST: 18 U/L (ref 0–37)
Albumin: 3.7 g/dL (ref 3.5–5.2)
Alkaline Phosphatase: 97 U/L (ref 39–117)
Anion gap: 13 (ref 5–15)
BUN: 7 mg/dL (ref 6–23)
CO2: 19 mEq/L (ref 19–32)
Calcium: 9.1 mg/dL (ref 8.4–10.5)
Chloride: 106 mEq/L (ref 96–112)
Creatinine, Ser: 0.77 mg/dL (ref 0.50–1.10)
GFR calc Af Amer: >90 mL/min (ref >90)
GFR calc non Af Amer: >90 mL/min (ref >90)
Glucose, Bld: 112 mg/dL — ABNORMAL HIGH (ref 70–99)
Potassium: 4 mEq/L (ref 3.7–5.3)
Sodium: 138 mEq/L (ref 137–147)
Total Bilirubin: <0.2 mg/dL — ABNORMAL LOW (ref 0.3–1.2)
Total Protein: 7.9 g/dL (ref 6.0–8.3)

## 2014-06-26 LAB — CBC WITH DIFFERENTIAL/PLATELET
Basophils Absolute: 0 10*3/uL (ref 0.0–0.1)
Basophils Relative: 0 % (ref 0–1)
Eosinophils Absolute: 0.1 10*3/uL (ref 0.0–0.7)
Eosinophils Relative: 2 % (ref 0–5)
HCT: 30 % — ABNORMAL LOW (ref 36.0–46.0)
Hemoglobin: 9.5 g/dL — ABNORMAL LOW (ref 12.0–15.0)
Lymphocytes Relative: 29 % (ref 12–46)
Lymphs Abs: 1.8 10*3/uL (ref 0.7–4.0)
MCH: 26.4 pg (ref 26.0–34.0)
MCHC: 31.7 g/dL (ref 30.0–36.0)
MCV: 83.3 fL (ref 78.0–100.0)
Monocytes Absolute: 0.4 10*3/uL (ref 0.1–1.0)
Monocytes Relative: 7 % (ref 3–12)
Neutro Abs: 3.9 10*3/uL (ref 1.7–7.7)
Neutrophils Relative %: 62 % (ref 43–77)
Platelets: 260 10*3/uL (ref 150–400)
RBC: 3.6 MIL/uL — ABNORMAL LOW (ref 3.87–5.11)
RDW: 15.2 % (ref 11.5–15.5)
WBC: 6.3 10*3/uL (ref 4.0–10.5)

## 2014-06-26 LAB — URINALYSIS, ROUTINE W REFLEX MICROSCOPIC
Bilirubin Urine: NEGATIVE
Glucose, UA: NEGATIVE mg/dL
Ketones, ur: NEGATIVE mg/dL
Nitrite: NEGATIVE
Protein, ur: NEGATIVE mg/dL
Specific Gravity, Urine: 1.022 (ref 1.005–1.030)
Urobilinogen, UA: 0.2 mg/dL (ref 0.0–1.0)
pH: 5.5 (ref 5.0–8.0)

## 2014-06-26 LAB — WET PREP, GENITAL
Trich, Wet Prep: NONE SEEN
Yeast Wet Prep HPF POC: NONE SEEN

## 2014-06-26 LAB — POC URINE PREG, ED: Preg Test, Ur: NEGATIVE

## 2014-06-26 MED ORDER — METRONIDAZOLE 500 MG PO TABS: 500.0000 mg | ORAL_TABLET | Freq: Three times a day (TID) | ORAL | Status: DC

## 2014-06-26 MED ORDER — OXYCODONE-ACETAMINOPHEN 5-325 MG PO TABS: 1.0000 | ORAL_TABLET | Freq: Four times a day (QID) | ORAL | Status: DC | PRN

## 2014-06-26 MED ORDER — OXYCODONE-ACETAMINOPHEN 5-325 MG PO TABS
1.0000 | ORAL_TABLET | Freq: Once | ORAL | Status: AC
  Administered 2014-06-26: 1 via ORAL
  Filled 2014-06-26: qty 1

## 2014-06-26 MED ORDER — AZITHROMYCIN 1 G PO PACK
1.0000 g | PACK | Freq: Once | ORAL | Status: AC
  Administered 2014-06-26: 1 g via ORAL
  Filled 2014-06-26: qty 1

## 2014-06-26 MED ORDER — METRONIDAZOLE 500 MG PO TABS
500.0000 mg | ORAL_TABLET | Freq: Once | ORAL | Status: AC
  Administered 2014-06-26: 500 mg via ORAL
  Filled 2014-06-26: qty 1

## 2014-06-26 MED ORDER — CEFTRIAXONE SODIUM 1 G IJ SOLR
1.0000 g | Freq: Once | INTRAMUSCULAR | Status: AC
  Administered 2014-06-26: 1 g via INTRAVENOUS
  Filled 2014-06-26: qty 10

## 2014-06-26 MED ORDER — SODIUM CHLORIDE 0.9 % IV BOLUS (SEPSIS)
1000.0000 mL | Freq: Once | INTRAVENOUS | Status: AC
  Administered 2014-06-26: 1000 mL via INTRAVENOUS

## 2014-06-26 MED ORDER — CIPROFLOXACIN HCL 500 MG PO TABS: 500.0000 mg | ORAL_TABLET | Freq: Two times a day (BID) | ORAL | Status: DC

## 2014-06-26 MED ORDER — MORPHINE SULFATE 4 MG/ML IJ SOLN
4.0000 mg | Freq: Once | INTRAMUSCULAR | Status: AC
  Administered 2014-06-26: 4 mg via INTRAVENOUS
  Filled 2014-06-26: qty 1

## 2014-06-26 NOTE — Discharge Instructions (Signed)
Take cipro and flagyl.   Take motrin for pain and percocet for severe pain. Do NOT drive with it.   Follow up with your doctor.   You should wear postop shoe. See orthopedic doctor.   Return to ER if you have severe pain, vomiting, fever.

## 2014-06-26 NOTE — ED Provider Notes (Addendum)
CSN: [id number]     Arrival date & time 06/26/14  1119 History   First MD Initiated Contact with Patient 06/26/14 1240     Chief Complaint  Patient presents with  . Ankle Pain  . Abdominal Pain     (Consider location/radiation/quality/duration/timing/severity/associated sxs/prior Treatment) The history is provided by the patient.  [name] is a 38 y.o. female hx of tubal ligation here with lower abdominal pain. Lower abdominal pain since this morning. Crampy, constant. No nausea or vomiting. No radiation to the pain. Has intermittent lower abdominal pain since she had her tubal ligation. Has some dysuria as well. Also has hx of L ankle surgery and felt that the screws may be loose. Denies recent injury.    History reviewed. No pertinent past medical history. Past Surgical History  Procedure Laterality Date  . Cesarean section    . Tubal ligation     No family history on file. History  Substance Use Topics  . Smoking status: Current Every Day Smoker -- 1.00 packs/day    Types: Cigarettes  . Smokeless tobacco: Not on file  . Alcohol Use: No   OB History   Grav Para Term Preterm Abortions TAB SAB Ect Mult Living                 Review of Systems  Gastrointestinal: Positive for abdominal pain.  Musculoskeletal:       L ankle pain   All other systems reviewed and are negative.     Allergies  Review of patient's allergies indicates no known allergies.  Home Medications   Prior to Admission medications   Medication Sig Start Date End Date Taking? Authorizing Provider  acetaminophen (TYLENOL) 500 MG tablet Take 1,000 mg by mouth every 6 (six) hours as needed for moderate pain.   Yes Historical Provider, MD  oxyCODONE-acetaminophen (PERCOCET/ROXICET) 5-325 MG per tablet Take 1 tablet by mouth every 4 (four) hours as needed for severe pain.   Yes Historical Provider, MD   BP 130/67  Pulse 58  Temp(Src) 98.8 F (37.1 C) (Oral)  Resp 16  SpO2 100%  LMP  06/11/2014 Physical Exam  Nursing note and vitals reviewed. Constitutional: She is oriented to person, place, and time.  Slightly uncomfortable   HENT:  Head: Normocephalic.  Mouth/Throat: Oropharynx is clear and moist.  Eyes: Conjunctivae are normal. Pupils are equal, round, and reactive to light.  Neck: Normal range of motion. Neck supple.  Cardiovascular: Normal rate, regular rhythm and normal heart sounds.   Pulmonary/Chest: Effort normal and breath sounds normal. No respiratory distress. She has no wheezes. She has no rales.  Abdominal: Soft. Bowel sounds are normal.  Mild suprapubic tenderness. No obvious RLQ tenderness   Genitourinary:  + whitish discharge. + mild uterine and L adnexal tenderness. No CMT   Musculoskeletal: Normal range of motion. She exhibits no edema and no tenderness.  L ankle no obvious deformity. Some swelling   Neurological: She is alert and oriented to person, place, and time. No cranial nerve deficit. Coordination normal.  Skin: Skin is warm and dry.  Psychiatric: She has a normal mood and affect. Her behavior is normal. Judgment and thought content normal.    ED Course  Procedures (including critical care time) Labs Review Labs Reviewed  WET PREP, GENITAL - Abnormal; Notable for the following:    Clue Cells Wet Prep HPF POC FEW (*)    WBC, Wet Prep HPF POC FEW (*)    All other components  within normal limits  CBC WITH DIFFERENTIAL - Abnormal; Notable for the following:    RBC 3.60 (*)    Hemoglobin 9.5 (*)    HCT 30.0 (*)    All other components within normal limits  COMPREHENSIVE METABOLIC PANEL - Abnormal; Notable for the following:    Glucose, Bld 112 (*)    Total Bilirubin <0.2 (*)    All other components within normal limits  URINALYSIS, ROUTINE W REFLEX MICROSCOPIC - Abnormal; Notable for the following:    APPearance CLOUDY (*)    Hgb urine dipstick SMALL (*)    Leukocytes, UA LARGE (*)    All other components within normal limits   URINE MICROSCOPIC-ADD ON - Abnormal; Notable for the following:    Bacteria, UA MANY (*)    All other components within normal limits  GC/CHLAMYDIA PROBE AMP  POC URINE PREG, ED    Imaging Review Dg Ankle Complete Left  06/26/2014   CLINICAL DATA:  Medial pain  EXAM: LEFT ANKLE COMPLETE - 3+ VIEW  COMPARISON:  None.  FINDINGS: There has been distant ORIF with plate and screw fixation of a distal fibular fracture and 2 screws for fixation of a medial malleolar fracture. There is solid union in the region. No complicating feature relative to the hardware. 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 There is a tailor peak. Midfoot degenerative changes are present as well including cystic change within the navicular.  IMPRESSION: Previous ORIF of fibular and medial malleolar fractures. Good healing. No hardware complication evident. The screws do not protrude medially. 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   Electronically Signed   By: Nelson Chimes M.D.   On: 06/26/2014 13:25   US Transvaginal Non-ob  06/26/2014   CLINICAL DATA:  Left adnexal tenderness.  Evaluate for torsion.  EXAM: TRANSVAGINAL ULTRASOUND OF PELVIS; ARTERIAL AND VENOUS ULTRASOUND OF THE ABDOMEN PELVIS AND SCROTUM; US PELVIS COMPLETE  TECHNIQUE: Transvaginal ultrasound examination of the pelvis was performed including evaluation of the uterus, ovaries, adnexal regions, and pelvic cul-de-sac.  COMPARISON:  Prior ultrasound from 03/13/2014  FINDINGS: Uterus  Measurements: 12.6 x 5.3 x 6.0 cm. No fibroids or other mass visualized.  Endometrium  Thickness: 12.8 mm.  No focal abnormality visualized.  Right ovary  Measurements: 5.0 x 2.7 x 4.1 cm. Normal appearance/no adnexal mass. No evidence of ovarian torsion. A hypoechoic cystic lesion measuring 2.1 x 2.6 x 2.0 cm seen within the right ovary, likely a physiologic cyst.  Left ovary  Measurements: 2.9 x 1.8 x 2.2 cm. Normal appearance/no adnexal mass.  No evidence of ovarian torsion.  Other findings: Trace free fluid present within the pelvis, likely physiologic.  IMPRESSION: 1. No sonographic evidence of ovarian torsion. 2. Physiologic right ovarian cyst. 3. Trace free fluid noted within the pelvis, likely physiologic.   Electronically Signed   By: Jeannine Boga M.D.   On: 06/26/2014 15:29   US Pelvis Complete  06/26/2014   CLINICAL DATA:  Left adnexal tenderness.  Evaluate for torsion.  EXAM: TRANSVAGINAL ULTRASOUND OF PELVIS; ARTERIAL AND VENOUS ULTRASOUND OF THE ABDOMEN PELVIS AND SCROTUM; US PELVIS COMPLETE  TECHNIQUE: Transvaginal ultrasound examination of the pelvis was performed including evaluation of the uterus, ovaries, adnexal regions, and pelvic cul-de-sac.  COMPARISON:  Prior ultrasound from 03/13/2014  FINDINGS: Uterus  Measurements: 12.6 x 5.3 x 6.0 cm. No fibroids or other mass visualized.  Endometrium  Thickness: 12.8 mm.  No focal abnormality visualized.  Right ovary  Measurements: 5.0 x 2.7 x 4.1 cm. Normal appearance/no adnexal mass. No evidence of ovarian torsion. A hypoechoic cystic lesion measuring 2.1 x 2.6 x 2.0 cm seen within the right ovary, likely a physiologic cyst.  Left ovary  Measurements: 2.9 x 1.8 x 2.2 cm. Normal appearance/no adnexal mass. No evidence of ovarian torsion.  Other findings: Trace free fluid present within the pelvis, likely physiologic.  IMPRESSION: 1. No sonographic evidence of ovarian torsion. 2. Physiologic right ovarian cyst. 3. Trace free fluid noted within the pelvis, likely physiologic.   Electronically Signed   By: Jeannine Boga M.D.   On: 06/26/2014 15:29   [place] Art/ven Flow Abd Pelv Doppler  06/26/2014   CLINICAL DATA:  Left adnexal tenderness.  Evaluate for torsion.  EXAM: TRANSVAGINAL ULTRASOUND OF PELVIS; ARTERIAL AND VENOUS ULTRASOUND OF THE ABDOMEN PELVIS AND SCROTUM; US PELVIS COMPLETE  TECHNIQUE: Transvaginal ultrasound examination of the pelvis was performed including evaluation  of the uterus, ovaries, adnexal regions, and pelvic cul-de-sac.  COMPARISON:  Prior ultrasound from 03/13/2014  FINDINGS: Uterus  Measurements: 12.6 x 5.3 x 6.0 cm. No fibroids or other mass visualized.  Endometrium  Thickness: 12.8 mm.  No focal abnormality visualized.  Right ovary  Measurements: 5.0 x 2.7 x 4.1 cm. Normal appearance/no adnexal mass. No evidence of ovarian torsion. A hypoechoic cystic lesion measuring 2.1 x 2.6 x 2.0 cm seen within the right ovary, likely a physiologic cyst.  Left ovary  Measurements: 2.9 x 1.8 x 2.2 cm. Normal appearance/no adnexal mass. No evidence of ovarian torsion.  Other findings: Trace free fluid present within the pelvis, likely physiologic.  IMPRESSION: 1. No sonographic evidence of ovarian torsion. 2. Physiologic right ovarian cyst. 3. Trace free fluid noted within the pelvis, likely physiologic.   Electronically Signed   By: Jeannine Boga M.D.   On: 06/26/2014 15:29     EKG Interpretation None      MDM   Final diagnoses:  None    JENICE LEINER is a 38 y.o. female here with lower abdominal pain. Likely UTI vs STDs. Has L adnexal tenderness so consider torsion.   3:45 PM UA + UTI. Given ceftriaxone. Also + BV, given flagyl. Also empirically treated for GC/chlamydia. US showed no torsion. Symptoms likely from UTI/BV. Will d/c on cipro/flagyl. Xray ankle showed that hard ware was in place. Hasn't been seeing ortho, will arrange for f/u. Will give pain meds and postop shoe.     Wandra Arthurs, MD 06/26/14 [place], MD 06/26/14 [phone]

## 2014-06-26 NOTE — ED Notes (Signed)
Per pt, states left ankle pain on and of for awhile-lower abdominal pain since this am, no N/V

## 2014-06-28 LAB — GC/CHLAMYDIA PROBE AMP
CT Probe RNA: NEGATIVE
GC Probe RNA: NEGATIVE

## 2014-10-06 ENCOUNTER — Emergency Department (HOSPITAL_COMMUNITY): Payer: No Typology Code available for payment source

## 2014-10-06 ENCOUNTER — Encounter (HOSPITAL_COMMUNITY): Payer: Self-pay | Admitting: Emergency Medicine

## 2014-10-06 ENCOUNTER — Emergency Department (HOSPITAL_COMMUNITY)
Admission: EM | Admit: 2014-10-06 | Discharge: 2014-10-06 | Disposition: A | Discharge status: Home / Self Care | Payer: No Typology Code available for payment source | Attending: Emergency Medicine | Admitting: Emergency Medicine

## 2014-10-06 DIAGNOSIS — S161XXA Strain of muscle, fascia and tendon at neck level, initial encounter: Secondary | ICD-10-CM | POA: Insufficient documentation

## 2014-10-06 DIAGNOSIS — R011 Cardiac murmur, unspecified: Secondary | ICD-10-CM | POA: Diagnosis not present

## 2014-10-06 DIAGNOSIS — Y9389 Activity, other specified: Secondary | ICD-10-CM | POA: Insufficient documentation

## 2014-10-06 DIAGNOSIS — Y92410 Unspecified street and highway as the place of occurrence of the external cause: Secondary | ICD-10-CM | POA: Diagnosis not present

## 2014-10-06 DIAGNOSIS — S199XXA Unspecified injury of neck, initial encounter: Secondary | ICD-10-CM | POA: Diagnosis present

## 2014-10-06 DIAGNOSIS — Z79899 Other long term (current) drug therapy: Secondary | ICD-10-CM | POA: Insufficient documentation

## 2014-10-06 DIAGNOSIS — Z720 Tobacco use: Secondary | ICD-10-CM | POA: Diagnosis not present

## 2014-10-06 DIAGNOSIS — S3992XA Unspecified injury of lower back, initial encounter: Secondary | ICD-10-CM | POA: Diagnosis not present

## 2014-10-06 HISTORY — DX: Cardiac murmur, unspecified: R01.1

## 2014-10-06 MED ORDER — TRAMADOL HCL 50 MG PO TABS: 50.0000 mg | ORAL_TABLET | Freq: Four times a day (QID) | ORAL | Status: DC | PRN

## 2014-10-06 MED ORDER — CYCLOBENZAPRINE HCL 10 MG PO TABS
10.0000 mg | ORAL_TABLET | Freq: Once | ORAL | Status: AC
  Administered 2014-10-06: 10 mg via ORAL
  Filled 2014-10-06: qty 1

## 2014-10-06 MED ORDER — CYCLOBENZAPRINE HCL 10 MG PO TABS: 10.0000 mg | ORAL_TABLET | Freq: Two times a day (BID) | ORAL | Status: DC | PRN

## 2014-10-06 MED ORDER — TRAMADOL HCL 50 MG PO TABS
50.0000 mg | ORAL_TABLET | Freq: Once | ORAL | Status: AC
  Administered 2014-10-06: 50 mg via ORAL
  Filled 2014-10-06: qty 1

## 2014-10-06 MED ORDER — MELOXICAM 15 MG PO TABS: 15.0000 mg | ORAL_TABLET | Freq: Every day | ORAL | Status: DC

## 2014-10-06 NOTE — Discharge Instructions (Signed)
Take Tramadol as needed for pain. Take Flexeril as needed for muscle spasm. Refer to attached documents for more information.

## 2014-10-06 NOTE — ED Notes (Signed)
Patient transported to X-ray 

## 2014-10-06 NOTE — ED Notes (Signed)
Patient transported to X-ray and returned to room. NAD noted.

## 2014-10-06 NOTE — ED Provider Notes (Signed)
Medical screening examination/treatment/procedure(s) were performed by non-physician practitioner and as supervising physician I was immediately available for consultation/collaboration.   Delora Fuel, MD 10/62/69 [id number]
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
## 2014-10-06 NOTE — ED Notes (Signed)
Awake. Verbally responsive. Resp even and unlabored. ABC's intact. NAD noted.

## 2014-11-19 ENCOUNTER — Ambulatory Visit: Payer: Medicaid Other | Admitting: Obstetrics

## 2015-01-07 ENCOUNTER — Other Ambulatory Visit (HOSPITAL_COMMUNITY)
Admission: RE | Admit: 2015-01-07 | Discharge: 2015-01-07 | Disposition: A | Discharge status: Home / Self Care | Payer: Medicaid Other | Source: Ambulatory Visit | Attending: Family Medicine | Admitting: Family Medicine

## 2015-01-07 ENCOUNTER — Encounter (HOSPITAL_COMMUNITY): Payer: Self-pay | Admitting: Emergency Medicine

## 2015-01-07 ENCOUNTER — Emergency Department (HOSPITAL_COMMUNITY)
Admission: EM | Admit: 2015-01-07 | Discharge: 2015-01-07 | Disposition: A | Discharge status: Home / Self Care | Payer: Medicaid Other | Source: Home / Self Care | Attending: Family Medicine | Admitting: Family Medicine

## 2015-01-07 DIAGNOSIS — Z113 Encounter for screening for infections with a predominantly sexual mode of transmission: Secondary | ICD-10-CM | POA: Insufficient documentation

## 2015-01-07 DIAGNOSIS — N760 Acute vaginitis: Secondary | ICD-10-CM

## 2015-01-07 DIAGNOSIS — R2231 Localized swelling, mass and lump, right upper limb: Secondary | ICD-10-CM

## 2015-01-07 LAB — POCT URINALYSIS DIP (DEVICE)
Bilirubin Urine: NEGATIVE
Glucose, UA: NEGATIVE mg/dL
Ketones, ur: NEGATIVE mg/dL
Leukocytes, UA: NEGATIVE
Nitrite: NEGATIVE
Protein, ur: NEGATIVE mg/dL
Specific Gravity, Urine: 1.025 (ref 1.005–1.030)
Urobilinogen, UA: 0.2 mg/dL (ref 0.0–1.0)
pH: 6 (ref 5.0–8.0)

## 2015-01-07 LAB — POCT PREGNANCY, URINE: Preg Test, Ur: NEGATIVE

## 2015-01-07 MED ORDER — METRONIDAZOLE 500 MG PO TABS: 500.0000 mg | ORAL_TABLET | Freq: Two times a day (BID) | ORAL | Status: DC

## 2015-01-07 NOTE — ED Provider Notes (Signed)
[name] is a 39 y.o. female who presents to Urgent Care today for vaginal discharge and odor present for about a week associated with pelvic pain. No urinary symptoms fevers chills nausea vomiting or diarrhea. Patient has tried Monistat which has not helped.  Patient additionally has a mass on her right third digit at the middle phalanx palmar aspect. Still present for several months and is mildly tender. No injury.   Past Medical History  Diagnosis Date  . Heart murmur    Past Surgical History  Procedure Laterality Date  . Cesarean section    . Tubal ligation     History  Substance Use Topics  . Smoking status: Current Every Day Smoker -- 1.00 packs/day    Types: Cigarettes  . Smokeless tobacco: Not on file  . Alcohol Use: No   ROS as above Medications: No current facility-administered medications for this encounter.   Current Outpatient Prescriptions  Medication Sig Dispense Refill  . ALPRAZolam (XANAX) 0.25 MG tablet Take 0.25 mg by mouth 3 (three) times daily as needed for anxiety.    . Oxycodone HCl 10 MG TABS Take 10 mg by mouth 3 (three) times daily.    Marland Kitchen tiZANidine (ZANAFLEX) 4 MG tablet Take 4 mg by mouth every 6 (six) hours as needed for muscle spasms.    . cyclobenzaprine (FLEXERIL) 10 MG tablet Take 1 tablet (10 mg total) by mouth 2 (two) times daily as needed for muscle spasms. 20 tablet 0  . meloxicam (MOBIC) 15 MG tablet Take 1 tablet (15 mg total) by mouth daily. 15 tablet 0  . ZOLPIDEM TARTRATE PO Take 1 tablet by mouth at bedtime as needed (sleep).     No Known Allergies   Exam:  BP 133/55 mmHg  Pulse 58  Temp(Src) 98.2 F (36.8 C) (Oral)  Resp 20  SpO2 100%  LMP 12/25/2014 Gen: Well NAD HEENT: EOMI,  MMM Lungs: Normal work of breathing. CTABL Heart: RRR no MRG Abd: NABS, Soft. Nondistended, no abdominal masses or tenderness. Exts: Brisk capillary refill, warm and well perfused.  Right hand: Slightly hyperpigmented slightly soft 1 cm mass  at the volar aspect of the third digit of the right hand at the middle phalanx. Minimally tender. Normal hand motion and strength. Sensation is intact GYN: Normal external genitalia. Vaginal canal with clear discharge. Fishy odor present. No cervical motion tenderness. No adnexal masses. Uterus is slightly enlarged. Nontender.  No results found for this or any previous visit (from the past 24 hour(s)). No results found.  Assessment and Plan: 39 y.o. female with  1) vaginitis: Likely BV. Cytology pending. Treat with metronidazole 2) finger mass. Unclear. Follow-up with hand surgery referral placed  Discussed warning signs or symptoms. Please see discharge instructions. Patient expresses understanding.     Gregor Hams, MD 01/07/15 [phone]

## 2015-01-07 NOTE — Discharge Instructions (Signed)
Thank you for coming in today. 1) for vaginal discharge. Take metronidazole twice daily for one week. Do not take with alcohol. Return as needed 2) finger mass: Follow-up with hand surgery. Your primary care provider may need to authorize that visit. Attempt to make an appointment   Bacterial Vaginosis Bacterial vaginosis is a vaginal infection that occurs when the normal balance of bacteria in the vagina is disrupted. It results from an overgrowth of certain bacteria. This is the most common vaginal infection in women of childbearing age. Treatment is important to prevent complications, especially in pregnant women, as it can cause a premature delivery. CAUSES  Bacterial vaginosis is caused by an increase in harmful bacteria that are normally present in smaller amounts in the vagina. Several different kinds of bacteria can cause bacterial vaginosis. However, the reason that the condition develops is not fully understood. RISK FACTORS Certain activities or behaviors can put you at an increased risk of developing bacterial vaginosis, including:  Having a new sex partner or multiple sex partners.  Douching.  Using an intrauterine device (IUD) for contraception. Women do not get bacterial vaginosis from toilet seats, bedding, swimming pools, or contact with objects around them. SIGNS AND SYMPTOMS  Some women with bacterial vaginosis have no signs or symptoms. Common symptoms include:  Grey vaginal discharge.  A fishlike odor with discharge, especially after sexual intercourse.  Itching or burning of the vagina and vulva.  Burning or pain with urination. DIAGNOSIS  Your health care provider will take a medical history and examine the vagina for signs of bacterial vaginosis. A sample of vaginal fluid may be taken. Your health care provider will look at this sample under a microscope to check for bacteria and abnormal cells. A vaginal pH test may also be done.  TREATMENT  Bacterial vaginosis  may be treated with antibiotic medicines. These may be given in the form of a pill or a vaginal cream. A second round of antibiotics may be prescribed if the condition comes back after treatment.  HOME CARE INSTRUCTIONS   Only take over-the-counter or prescription medicines as directed by your health care provider.  If antibiotic medicine was prescribed, take it as directed. Make sure you finish it even if you start to feel better.  Do not have sex until treatment is completed.  Tell all sexual partners that you have a vaginal infection. They should see their health care provider and be treated if they have problems, such as a mild rash or itching.  Practice safe sex by using condoms and only having one sex partner. SEEK MEDICAL CARE IF:   Your symptoms are not improving after 3 days of treatment.  You have increased discharge or pain.  You have a fever. MAKE SURE YOU:   Understand these instructions.  Will watch your condition.  Will get help right away if you are not doing well or get worse. FOR MORE INFORMATION  Centers for Disease Control and Prevention, Division of STD Prevention: [employer]raud[employer] American Sexual Health Association (ASHA): www.ashastd.org  Document Released: 11/28/2005 Document Revised: 09/18/2013 Document Reviewed: 07/10/2013 [facility] Patient Information 2015 [place], [place]. This information is not intended to replace advice given to you by your health care provider. Make sure you discuss any questions you have with your health care provider.

## 2015-01-07 NOTE — ED Notes (Signed)
C/o vag d/c onset 1 week Sx include: foul smell, vag irritation, lower abd pain Denies urinary sx, fevers, chills Has been applying OTC yeast med w/no relief Alert, no signs of acute distress.

## 2015-01-08 LAB — CERVICOVAGINAL ANCILLARY ONLY
Chlamydia: NEGATIVE
Neisseria Gonorrhea: NEGATIVE

## 2015-01-09 LAB — CERVICOVAGINAL ANCILLARY ONLY
Wet Prep (BD Affirm): NEGATIVE
Wet Prep (BD Affirm): NEGATIVE
Wet Prep (BD Affirm): POSITIVE — AB

## 2015-01-12 NOTE — ED Notes (Signed)
GC/Chlamydia neg., Affirm: Candida and Trich neg., Gardnerella pos.  Pt. adequately treated with Flagyl. Roselyn Meier 01/12/2015

## 2015-04-23 ENCOUNTER — Other Ambulatory Visit: Payer: Self-pay | Admitting: Gastroenterology

## 2015-04-23 DIAGNOSIS — R1030 Lower abdominal pain, unspecified: Secondary | ICD-10-CM

## 2015-04-30 ENCOUNTER — Ambulatory Visit
Admission: RE | Admit: 2015-04-30 | Discharge: 2015-04-30 | Disposition: A | Discharge status: Home / Self Care | Payer: Medicaid Other | Source: Ambulatory Visit | Attending: Gastroenterology | Admitting: Gastroenterology

## 2015-04-30 DIAGNOSIS — R1030 Lower abdominal pain, unspecified: Secondary | ICD-10-CM

## 2015-04-30 MED ORDER — IOPAMIDOL (ISOVUE-300) INJECTION 61%
100.0000 mL | Freq: Once | INTRAVENOUS | Status: AC | PRN
  Administered 2015-04-30: 100 mL via INTRAVENOUS

## 2015-05-13 ENCOUNTER — Other Ambulatory Visit (HOSPITAL_COMMUNITY): Payer: Self-pay | Admitting: Obstetrics

## 2015-05-13 DIAGNOSIS — Z1231 Encounter for screening mammogram for malignant neoplasm of breast: Secondary | ICD-10-CM

## 2015-05-25 ENCOUNTER — Ambulatory Visit (HOSPITAL_COMMUNITY)
Admission: RE | Admit: 2015-05-25 | Discharge: 2015-05-25 | Disposition: A | Discharge status: Home / Self Care | Payer: Medicaid Other | Source: Ambulatory Visit | Attending: Obstetrics | Admitting: Obstetrics

## 2015-05-25 ENCOUNTER — Other Ambulatory Visit (HOSPITAL_COMMUNITY): Payer: Self-pay | Admitting: Obstetrics

## 2015-05-25 DIAGNOSIS — Z1231 Encounter for screening mammogram for malignant neoplasm of breast: Secondary | ICD-10-CM

## 2015-05-27 ENCOUNTER — Emergency Department (INDEPENDENT_AMBULATORY_CARE_PROVIDER_SITE_OTHER)
Admission: EM | Admit: 2015-05-27 | Discharge: 2015-05-27 | Disposition: A | Discharge status: Nursing Home (Includes ICF) | Payer: Medicaid Other | Source: Home / Self Care | Attending: Family Medicine | Admitting: Family Medicine

## 2015-05-27 ENCOUNTER — Encounter (HOSPITAL_COMMUNITY): Payer: Self-pay | Admitting: Emergency Medicine

## 2015-05-27 ENCOUNTER — Emergency Department (HOSPITAL_COMMUNITY)
Admission: EM | Admit: 2015-05-27 | Discharge: 2015-05-27 | Discharge status: Against Medical Advice | Payer: Medicaid Other | Attending: Emergency Medicine | Admitting: Emergency Medicine

## 2015-05-27 DIAGNOSIS — R1031 Right lower quadrant pain: Secondary | ICD-10-CM | POA: Diagnosis not present

## 2015-05-27 DIAGNOSIS — Z720 Tobacco use: Secondary | ICD-10-CM | POA: Insufficient documentation

## 2015-05-27 DIAGNOSIS — R011 Cardiac murmur, unspecified: Secondary | ICD-10-CM | POA: Insufficient documentation

## 2015-05-27 DIAGNOSIS — Z79891 Long term (current) use of opiate analgesic: Secondary | ICD-10-CM | POA: Diagnosis not present

## 2015-05-27 LAB — COMPREHENSIVE METABOLIC PANEL
ALT: 16 U/L (ref 14–54)
AST: 19 U/L (ref 15–41)
Albumin: 3.6 g/dL (ref 3.5–5.0)
Alkaline Phosphatase: 88 U/L (ref 38–126)
Anion gap: 5 (ref 5–15)
BUN: <5 mg/dL — ABNORMAL LOW (ref 6–20)
CO2: 25 mmol/L (ref 22–32)
Calcium: 9 mg/dL (ref 8.9–10.3)
Chloride: 108 mmol/L (ref 101–111)
Creatinine, Ser: 0.77 mg/dL (ref 0.44–1.00)
GFR calc Af Amer: >60 mL/min (ref >60)
GFR calc non Af Amer: >60 mL/min (ref >60)
Glucose, Bld: 89 mg/dL (ref 65–99)
Potassium: 3.8 mmol/L (ref 3.5–5.1)
Sodium: 138 mmol/L (ref 135–145)
Total Bilirubin: 0.3 mg/dL (ref 0.3–1.2)
Total Protein: 7 g/dL (ref 6.5–8.1)

## 2015-05-27 LAB — POCT URINALYSIS DIP (DEVICE)
Bilirubin Urine: NEGATIVE
Glucose, UA: NEGATIVE mg/dL
Ketones, ur: NEGATIVE mg/dL
Nitrite: NEGATIVE
Protein, ur: NEGATIVE mg/dL
Specific Gravity, Urine: 1.02 (ref 1.005–1.030)
Urobilinogen, UA: 0.2 mg/dL (ref 0.0–1.0)
pH: 6.5 (ref 5.0–8.0)

## 2015-05-27 LAB — CBC WITH DIFFERENTIAL/PLATELET
Basophils Absolute: 0 10*3/uL (ref 0.0–0.1)
Basophils Relative: 0 % (ref 0–1)
Eosinophils Absolute: 0.2 10*3/uL (ref 0.0–0.7)
Eosinophils Relative: 2 % (ref 0–5)
HCT: 28.8 % — ABNORMAL LOW (ref 36.0–46.0)
Hemoglobin: 8.7 g/dL — ABNORMAL LOW (ref 12.0–15.0)
Lymphocytes Relative: 41 % (ref 12–46)
Lymphs Abs: 3 10*3/uL (ref 0.7–4.0)
MCH: 24 pg — ABNORMAL LOW (ref 26.0–34.0)
MCHC: 30.2 g/dL (ref 30.0–36.0)
MCV: 79.3 fL (ref 78.0–100.0)
Monocytes Absolute: 0.5 10*3/uL (ref 0.1–1.0)
Monocytes Relative: 7 % (ref 3–12)
Neutro Abs: 3.7 10*3/uL (ref 1.7–7.7)
Neutrophils Relative %: 50 % (ref 43–77)
Platelets: 207 10*3/uL (ref 150–400)
RBC: 3.63 MIL/uL — ABNORMAL LOW (ref 3.87–5.11)
RDW: 17.9 % — ABNORMAL HIGH (ref 11.5–15.5)
WBC: 7.3 10*3/uL (ref 4.0–10.5)

## 2015-05-27 LAB — POCT PREGNANCY, URINE: Preg Test, Ur: NEGATIVE

## 2015-05-27 NOTE — ED Notes (Signed)
Pt c/o RLQ pain and right hand pain x 2 days

## 2015-05-27 NOTE — ED Notes (Signed)
Pt expressed desire to leave AMA.  RN apologized for the delay and again attempted to convince pt to stay, however she decided to leave.  Pt signed [facility] paperwork and left.

## 2015-05-27 NOTE — ED Notes (Signed)
C/o intermittent abd pain onset today; hurts to walk; hx of fibroids Also c/o right hand pain onset 3 days; denies inj/trauma... Pain increases w/activity Alert, no signs of acute distress.
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
## 2015-05-27 NOTE — ED Provider Notes (Signed)
CSN: [id number]     Arrival date & time 05/27/15  1526 History   First MD Initiated Contact with Patient 05/27/15 1952     Chief Complaint  Patient presents with  . Abdominal Pain     (Consider location/radiation/quality/duration/timing/severity/associated sxs/prior Treatment) Patient is a 39 y.o. female presenting with abdominal pain.  Abdominal Pain Pain location:  RLQ Pain quality: aching   Pain radiates to:  Does not radiate Pain severity:  Severe Onset quality:  Gradual Duration:  2 days Timing:  Intermittent Progression:  Worsening Chronicity:  New Context: previous surgery   Context: not alcohol use, not diet changes, not eating, not laxative use, not medication withdrawal, not recent illness, not recent sexual activity, not sick contacts and not suspicious food intake   Relieved by:  Nothing Worsened by:  Movement Ineffective treatments:  Acetaminophen Associated symptoms: no anorexia, no chest pain, no chills, no constipation, no cough, no diarrhea, no dysuria, no fatigue, no fever, no melena, no nausea, no shortness of breath, no sore throat, no vaginal bleeding, no vaginal discharge and no vomiting   Risk factors: no alcohol abuse, not pregnant and no recent hospitalization     Past Medical History  Diagnosis Date  . Heart murmur    Past Surgical History  Procedure Laterality Date  . Cesarean section    . Tubal ligation     History reviewed. No pertinent family history. History  Substance Use Topics  . Smoking status: Current Every Day Smoker -- 1.00 packs/day    Types: Cigarettes  . Smokeless tobacco: Not on file  . Alcohol Use: No   OB History    No data available     Review of Systems  Constitutional: Negative for fever, chills, appetite change and fatigue.  HENT: Negative for congestion, ear pain, facial swelling, mouth sores and sore throat.   Eyes: Negative for visual disturbance.  Respiratory: Negative for cough, chest tightness and shortness  of breath.   Cardiovascular: Negative for chest pain and palpitations.  Gastrointestinal: Positive for abdominal pain. Negative for nausea, vomiting, diarrhea, constipation, blood in stool, melena and anorexia.  Endocrine: Negative for cold intolerance and heat intolerance.  Genitourinary: Negative for dysuria, frequency, decreased urine volume, vaginal bleeding, vaginal discharge and difficulty urinating.  Musculoskeletal: Negative for back pain and neck stiffness.  Skin: Negative for rash.  Neurological: Negative for dizziness, weakness, light-headedness and headaches.  All other systems reviewed and are negative.     Allergies  Review of patient's allergies indicates no known allergies.  Home Medications   Prior to Admission medications   Medication Sig Start Date End Date Taking? Authorizing Provider  ALPRAZolam (XANAX) 0.25 MG tablet Take 0.25 mg by mouth 3 (three) times daily as needed for anxiety.   Yes Historical Provider, MD  Oxycodone HCl 10 MG TABS Take 10 mg by mouth 3 (three) times daily.   Yes Historical Provider, MD  tiZANidine (ZANAFLEX) 4 MG tablet Take 4 mg by mouth every 6 (six) hours as needed for muscle spasms.   Yes Historical Provider, MD  cyclobenzaprine (FLEXERIL) 10 MG tablet Take 1 tablet (10 mg total) by mouth 2 (two) times daily as needed for muscle spasms. Patient not taking: Reported on 05/27/2015 10/06/14   Alvina Chou, PA-C  meloxicam (MOBIC) 15 MG tablet Take 1 tablet (15 mg total) by mouth daily. Patient not taking: Reported on 05/27/2015 10/06/14   Alvina Chou, PA-C  metroNIDAZOLE (FLAGYL) 500 MG tablet Take 1 tablet (500 mg total) by  mouth 2 (two) times daily. Patient not taking: Reported on 05/27/2015 01/07/15   Gregor Hams, MD   BP 127/72 mmHg  Pulse 62  Temp(Src) 98.5 F (36.9 C) (Oral)  Resp 18  SpO2 100%  LMP 05/01/2015 Physical Exam  Constitutional: She is oriented to person, place, and time. She appears well-developed and  well-nourished. No distress.  HENT:  Head: Normocephalic and atraumatic.  Right Ear: External ear normal.  Left Ear: External ear normal.  Nose: Nose normal.  Eyes: Conjunctivae and EOM are normal. Pupils are equal, round, and reactive to light. Right eye exhibits no discharge. Left eye exhibits no discharge. No scleral icterus.  Neck: Normal range of motion. Neck supple.  Cardiovascular: Normal rate, regular rhythm and normal heart sounds.  Exam reveals no gallop and no friction rub.   No murmur heard. Pulmonary/Chest: Effort normal and breath sounds normal. No stridor. No respiratory distress. She has no wheezes.  Abdominal: Soft. She exhibits no distension. There is no tenderness.  Musculoskeletal: She exhibits no edema or tenderness.  Neurological: She is alert and oriented to person, place, and time.  Skin: Skin is warm and dry. No rash noted. She is not diaphoretic. No erythema.  Psychiatric: She has a normal mood and affect.    ED Course  Procedures (including critical care time) Labs Review Labs Reviewed  CBC WITH DIFFERENTIAL/PLATELET - Abnormal; Notable for the following:    RBC 3.63 (*)    Hemoglobin 8.7 (*)    HCT 28.8 (*)    MCH 24.0 (*)    RDW 17.9 (*)    All other components within normal limits  COMPREHENSIVE METABOLIC PANEL - Abnormal; Notable for the following:    BUN <5 (*)    All other components within normal limits    Imaging Review No results found.   EKG Interpretation None      MDM  39 year old female presents with 2 days of right lower quadrant abdominal pain. History of present illness and exam as above. CBC without leukocytosis. It did show a hemoglobin of 8.7 however this is baseline per patient. CMP without electrolyte derangements, transaminitis, or evidence of biliary obstruction.   I left the room to organize for pelvic examination at which time I was pulled away to attend to high acuity patient's. Patient became frustrated and angry at  the delay. The RN attempted to explain the situation however the patient requested to leave [facility].  Patient was seen walking out of the ED in good condition and without complication.  Vision seen in conjunction with Dr. Tamera Punt.  Sibyl Parr, M.D. Resident  Final diagnoses:  None        Addison Lank, MD 05/28/15 [place], MD 05/30/15 [phone]

## 2015-05-27 NOTE — ED Notes (Signed)
Pt inquired as to the delay.  Explained delay.  Pt agreed to wait.

## 2015-05-27 NOTE — ED Provider Notes (Signed)
[name] is a 39 y.o. female who presents to Urgent Care today for abdominal pain and hand pain. 1) abdominal pain. Patient has a several day history of intermittent abdominal pain worsening recently. The pain is now constant 10 out of 10 and located in her right lower quadrant. She denies any vomiting or diarrhea or vaginal bleeding. She notes mild urinary frequency. No fevers or chills.  2) right hand pain: Patient has a several day history of pain in the dorsal aspect of her right hand. She notes a sensitive area and around the second or third metacarpal produces electric pain to her fingers. This occurred after she had what seems to be a nerve conduction study on her lower leg.  She denies any injury.   Past Medical History  Diagnosis Date  . Heart murmur    Past Surgical History  Procedure Laterality Date  . Cesarean section    . Tubal ligation     History  Substance Use Topics  . Smoking status: Current Every Day Smoker -- 1.00 packs/day    Types: Cigarettes  . Smokeless tobacco: Not on file  . Alcohol Use: No   ROS as above Medications: No current facility-administered medications for this encounter.   Current Outpatient Prescriptions  Medication Sig Dispense Refill  . ALPRAZolam (XANAX) 0.25 MG tablet Take 0.25 mg by mouth 3 (three) times daily as needed for anxiety.    . cyclobenzaprine (FLEXERIL) 10 MG tablet Take 1 tablet (10 mg total) by mouth 2 (two) times daily as needed for muscle spasms. 20 tablet 0  . meloxicam (MOBIC) 15 MG tablet Take 1 tablet (15 mg total) by mouth daily. 15 tablet 0  . metroNIDAZOLE (FLAGYL) 500 MG tablet Take 1 tablet (500 mg total) by mouth 2 (two) times daily. 14 tablet 0  . Oxycodone HCl 10 MG TABS Take 10 mg by mouth 3 (three) times daily.    Marland Kitchen tiZANidine (ZANAFLEX) 4 MG tablet Take 4 mg by mouth every 6 (six) hours as needed for muscle spasms.    Marland Kitchen ZOLPIDEM TARTRATE PO Take 1 tablet by mouth at bedtime as needed (sleep).     No  Known Allergies   Exam:  BP 137/74 mmHg  Pulse 64  Temp(Src) 98.6 F (37 C) (Oral)  Resp 20  SpO2 100%  LMP 05/01/2015 Gen: Well NAD HEENT: EOMI,  MMM Lungs: Normal work of breathing. CTABL Heart: RRR no MRG Abd: NABS, Soft. Nondistended, tender palpation right lower quadrant with guarding without rebound. No CVA tenderness to percussion Exts: Brisk capillary refill, warm and well perfused.  Hand: Right hand normal-appearing. Mildly tender second and third metacarpals. Hand motion grip strength pulses capillary refill and sensation intact   No results found for this or any previous visit (from the past 24 hour(s)). No results found.  Assessment and Plan: 39 y.o. female with severe 10 out of 10 abdominal pain in the right lower quadrant. Concerning for early appendicitis. Transfer to the emergency room for further evaluation and management.  Hand pain deferred to ED.   Discussed warning signs or symptoms. Please see discharge instructions. Patient expresses understanding.     Gregor Hams, MD 05/27/15 [phone]

## 2015-07-03 ENCOUNTER — Other Ambulatory Visit (HOSPITAL_COMMUNITY): Payer: Self-pay | Admitting: Obstetrics

## 2015-07-03 DIAGNOSIS — N938 Other specified abnormal uterine and vaginal bleeding: Secondary | ICD-10-CM

## 2015-07-14 ENCOUNTER — Ambulatory Visit (HOSPITAL_COMMUNITY)
Admission: RE | Admit: 2015-07-14 | Discharge: 2015-07-14 | Disposition: A | Discharge status: Home / Self Care | Payer: Medicaid Other | Source: Ambulatory Visit | Attending: Obstetrics | Admitting: Obstetrics

## 2015-07-14 DIAGNOSIS — N8320 Unspecified ovarian cysts: Secondary | ICD-10-CM | POA: Diagnosis not present

## 2015-07-14 DIAGNOSIS — N854 Malposition of uterus: Secondary | ICD-10-CM | POA: Insufficient documentation

## 2015-07-14 DIAGNOSIS — N938 Other specified abnormal uterine and vaginal bleeding: Secondary | ICD-10-CM | POA: Diagnosis present

## 2015-07-14 DIAGNOSIS — D251 Intramural leiomyoma of uterus: Secondary | ICD-10-CM | POA: Diagnosis not present

## 2015-07-14 DIAGNOSIS — R1031 Right lower quadrant pain: Secondary | ICD-10-CM | POA: Insufficient documentation

## 2015-08-25 ENCOUNTER — Inpatient Hospital Stay (HOSPITAL_COMMUNITY)
Admission: AD | Admit: 2015-08-25 | Discharge: 2015-08-25 | Disposition: A | Discharge status: Home / Self Care | Payer: Medicaid Other | Source: Ambulatory Visit | Attending: Obstetrics | Admitting: Obstetrics

## 2015-08-25 DIAGNOSIS — D251 Intramural leiomyoma of uterus: Secondary | ICD-10-CM | POA: Insufficient documentation

## 2015-08-25 DIAGNOSIS — R102 Pelvic and perineal pain: Secondary | ICD-10-CM | POA: Diagnosis not present

## 2015-08-25 DIAGNOSIS — B9689 Other specified bacterial agents as the cause of diseases classified elsewhere: Secondary | ICD-10-CM | POA: Diagnosis not present

## 2015-08-25 DIAGNOSIS — N854 Malposition of uterus: Secondary | ICD-10-CM | POA: Insufficient documentation

## 2015-08-25 DIAGNOSIS — N946 Dysmenorrhea, unspecified: Secondary | ICD-10-CM

## 2015-08-25 DIAGNOSIS — Z79899 Other long term (current) drug therapy: Secondary | ICD-10-CM | POA: Insufficient documentation

## 2015-08-25 DIAGNOSIS — R1030 Lower abdominal pain, unspecified: Secondary | ICD-10-CM | POA: Diagnosis not present

## 2015-08-25 DIAGNOSIS — F17210 Nicotine dependence, cigarettes, uncomplicated: Secondary | ICD-10-CM | POA: Insufficient documentation

## 2015-08-25 DIAGNOSIS — N760 Acute vaginitis: Secondary | ICD-10-CM | POA: Diagnosis not present

## 2015-08-25 DIAGNOSIS — M545 Low back pain: Secondary | ICD-10-CM | POA: Diagnosis not present

## 2015-08-25 DIAGNOSIS — R109 Unspecified abdominal pain: Secondary | ICD-10-CM

## 2015-08-25 LAB — WET PREP, GENITAL
Trich, Wet Prep: NONE SEEN
Yeast Wet Prep HPF POC: NONE SEEN

## 2015-08-25 LAB — CBC WITH DIFFERENTIAL/PLATELET
Basophils Absolute: 0 10*3/uL (ref 0.0–0.1)
Basophils Relative: 0 % (ref 0–1)
Eosinophils Absolute: 0.1 10*3/uL (ref 0.0–0.7)
Eosinophils Relative: 2 % (ref 0–5)
HCT: 31 % — ABNORMAL LOW (ref 36.0–46.0)
Hemoglobin: 9.7 g/dL — ABNORMAL LOW (ref 12.0–15.0)
Lymphocytes Relative: 38 % (ref 12–46)
Lymphs Abs: 2.6 10*3/uL (ref 0.7–4.0)
MCH: 26.4 pg (ref 26.0–34.0)
MCHC: 31.3 g/dL (ref 30.0–36.0)
MCV: 84.5 fL (ref 78.0–100.0)
Monocytes Absolute: 0.5 10*3/uL (ref 0.1–1.0)
Monocytes Relative: 8 % (ref 3–12)
Neutro Abs: 3.6 10*3/uL (ref 1.7–7.7)
Neutrophils Relative %: 52 % (ref 43–77)
Other: 0 %
Platelets: 259 10*3/uL (ref 150–400)
RBC: 3.67 MIL/uL — ABNORMAL LOW (ref 3.87–5.11)
RDW: 20.6 % — ABNORMAL HIGH (ref 11.5–15.5)
WBC: 6.8 10*3/uL (ref 4.0–10.5)

## 2015-08-25 LAB — URINALYSIS, ROUTINE W REFLEX MICROSCOPIC
Bilirubin Urine: NEGATIVE
Glucose, UA: NEGATIVE mg/dL
Ketones, ur: NEGATIVE mg/dL
Leukocytes, UA: NEGATIVE
Nitrite: NEGATIVE
Protein, ur: NEGATIVE mg/dL
Specific Gravity, Urine: 1.015 (ref 1.005–1.030)
Urobilinogen, UA: 0.2 mg/dL (ref 0.0–1.0)
pH: 6.5 (ref 5.0–8.0)

## 2015-08-25 LAB — RAPID HIV SCREEN (HIV 1/2 AB+AG)
HIV 1/2 Antibodies: NONREACTIVE
HIV-1 P24 Antigen - HIV24: NONREACTIVE

## 2015-08-25 LAB — POCT PREGNANCY, URINE: Preg Test, Ur: NEGATIVE

## 2015-08-25 LAB — URINE MICROSCOPIC-ADD ON

## 2015-08-25 MED ORDER — IBUPROFEN 800 MG PO TABS: 800.0000 mg | ORAL_TABLET | Freq: Three times a day (TID) | ORAL | Status: DC

## 2015-08-25 MED ORDER — METRONIDAZOLE 500 MG PO TABS
500.0000 mg | ORAL_TABLET | Freq: Two times a day (BID) | ORAL | Status: DC
Start: 2015-08-25 — End: 2016-01-06

## 2015-08-25 MED ORDER — KETOROLAC TROMETHAMINE 60 MG/2ML IM SOLN
60.0000 mg | Freq: Once | INTRAMUSCULAR | Status: AC
  Administered 2015-08-25: 60 mg via INTRAMUSCULAR
  Filled 2015-08-25: qty 2

## 2015-08-25 MED ORDER — NITROFURANTOIN MONOHYD MACRO 100 MG PO CAPS: 100.0000 mg | ORAL_CAPSULE | Freq: Two times a day (BID) | ORAL | Status: DC

## 2015-08-25 NOTE — MAU Note (Signed)
Lower abd & back pain for the last week, also dysuria.  Denies vaginal bleeding or discharge.

## 2015-08-25 NOTE — Discharge Instructions (Signed)

## 2015-08-25 NOTE — MAU Provider Note (Signed)
History     CSN: [id number]  Arrival date and time: 08/25/15 1159   None     Chief Complaint  Patient presents with  . Dysuria  . Abdominal Pain  . Back Pain   HPI 39 yo not pregnant female c/o of lower abd pain and back pain for past week Pt also c/o of dysuria.   Pt c/o of lower abdominal pain and lower back pain- constant pain in her vagina-lower abd pain and lower back pain come and go over past week- worsening.  Pt goes to [facility] for her PCP- has seen Dr. Ruthann Cancer for GYn and told she has fibroids. Previous ultrasound on 07/14/2015 showed retroverted uterus with 3.3x2.3x1.7 intramural fibroid within the fundus and a right ovarian simple cyst Denies vaginal discharge Last had IC 1 month ago. Pt denies constipation, diarrhea, pain with urination Denies fever, chills. Has RCM - LNMP 07/27/2015- heavy lasting 1 week; spotting beginning of the month for 2 days Has had BTL- pt has not taken anything for pain. Had normal soft BM this morning. RN note: Alto Denver, RN Registered Nurse Signed  MAU Note 08/25/2015 12:18 PM    Expand All Collapse All   Lower abd & back pain for the last week, also dysuria. Denies vaginal bleeding or discharge.       Past Medical History  Diagnosis Date  . Heart murmur     Past Surgical History  Procedure Laterality Date  . Cesarean section    . Tubal ligation      No family history on file.  Social History  Substance Use Topics  . Smoking status: Current Every Day Smoker -- 1.00 packs/day    Types: Cigarettes  . Smokeless tobacco: Not on file  . Alcohol Use: No    Allergies: No Known Allergies  Prescriptions prior to admission  Medication Sig Dispense Refill Last Dose  . ALPRAZolam (XANAX) 0.25 MG tablet Take 0.25 mg by mouth 3 (three) times daily as needed for anxiety.   PRN  . cyclobenzaprine (FLEXERIL) 10 MG tablet Take 1 tablet (10 mg total) by mouth 2 (two) times daily as needed for muscle spasms. (Patient not taking:  Reported on 05/27/2015) 20 tablet 0 Completed Course at Unknown time  . meloxicam (MOBIC) 15 MG tablet Take 1 tablet (15 mg total) by mouth daily. (Patient not taking: Reported on 05/27/2015) 15 tablet 0 Completed Course at Unknown time  . metroNIDAZOLE (FLAGYL) 500 MG tablet Take 1 tablet (500 mg total) by mouth 2 (two) times daily. (Patient not taking: Reported on 05/27/2015) 14 tablet 0 Completed Course at Unknown time  . Oxycodone HCl 10 MG TABS Take 10 mg by mouth 3 (three) times daily.   05/26/2015 at Unknown time  . tiZANidine (ZANAFLEX) 4 MG tablet Take 4 mg by mouth every 6 (six) hours as needed for muscle spasms.   PRN    Review of Systems  Constitutional: Negative for fever and chills.  Gastrointestinal: Positive for abdominal pain. Negative for heartburn, nausea, diarrhea and constipation.  Genitourinary: Positive for dysuria, urgency and frequency. Negative for hematuria.  Musculoskeletal: Positive for back pain.  Neurological: Negative for headaches.   Physical Exam   Last menstrual period 07/27/2015.  Physical Exam  Vitals reviewed. Constitutional: She is oriented to person, place, and time. She appears well-developed and well-nourished. No distress.  HENT:  Head: Normocephalic.  Eyes: Pupils are equal, round, and reactive to light.  Neck: Normal range of motion. Neck supple.  Respiratory: Effort normal.  GI: Soft. She exhibits no distension. There is no tenderness. There is no rebound and no guarding.  Genitourinary:  Small amount of watery clear discharge in vaul;t small amount of blood from os; uterus enlarged, irregular, no CMT; no appreciable uterine tenderness- no palpable adnexal enlargement or tenderness  Musculoskeletal: Normal range of motion.  Neurological: She is alert and oriented to person, place, and time.  Skin: Skin is warm and dry.  Psychiatric: She has a normal mood and affect.    MAU Course  Procedures Toradol 60mg  IM given for pain Results for  orders placed or performed during the hospital encounter of 08/25/15 (from the past 24 hour(s))  Urinalysis, Routine w reflex microscopic (not at [facility])     Status: Abnormal   Collection Time: 08/25/15 12:20 PM  Result Value Ref Range   Color, Urine YELLOW YELLOW   APPearance CLEAR CLEAR   Specific Gravity, Urine 1.015 1.005 - 1.030   pH 6.5 5.0 - 8.0   Glucose, UA NEGATIVE NEGATIVE mg/dL   Hgb urine dipstick TRACE (A) NEGATIVE   Bilirubin Urine NEGATIVE NEGATIVE   Ketones, ur NEGATIVE NEGATIVE mg/dL   Protein, ur NEGATIVE NEGATIVE mg/dL   Urobilinogen, UA 0.2 0.0 - 1.0 mg/dL   Nitrite NEGATIVE NEGATIVE   Leukocytes, UA NEGATIVE NEGATIVE  Urine microscopic-add on     Status: Abnormal   Collection Time: 08/25/15 12:20 PM  Result Value Ref Range   Squamous Epithelial / LPF RARE RARE   WBC, UA 0-2 <3 WBC/hpf   RBC / HPF 7-10 <3 RBC/hpf   Bacteria, UA FEW (A) RARE  Pregnancy, urine POC     Status: None   Collection Time: 08/25/15 12:36 PM  Result Value Ref Range   Preg Test, Ur NEGATIVE NEGATIVE  Wet prep, genital     Status: Abnormal   Collection Time: 08/25/15  2:55 PM  Result Value Ref Range   Yeast Wet Prep HPF POC NONE SEEN NONE SEEN   Trich, Wet Prep NONE SEEN NONE SEEN   Clue Cells Wet Prep HPF POC MANY (A) NONE SEEN   WBC, Wet Prep HPF POC FEW (A) NONE SEEN  CBC with Differential     Status: Abnormal   Collection Time: 08/25/15  3:06 PM  Result Value Ref Range   WBC 6.8 4.0 - 10.5 K/uL   RBC 3.67 (L) 3.87 - 5.11 MIL/uL   Hemoglobin 9.7 (L) 12.0 - 15.0 g/dL   HCT 31.0 (L) 36.0 - 46.0 %   MCV 84.5 78.0 - 100.0 fL   MCH 26.4 26.0 - 34.0 pg   MCHC 31.3 30.0 - 36.0 g/dL   RDW 20.6 (H) 11.5 - 15.5 %   Platelets 259 150 - 400 K/uL   Neutrophils Relative % 52 43 - 77 %   Lymphocytes Relative 38 12 - 46 %   Monocytes Relative 8 3 - 12 %   Eosinophils Relative 2 0 - 5 %   Basophils Relative 0 0 - 1 %   Other 0 %   Neutro Abs 3.6 1.7 - 7.7 K/uL   Lymphs Abs 2.6 0.7 - 4.0  K/uL   Monocytes Absolute 0.5 0.1 - 1.0 K/uL   Eosinophils Absolute 0.1 0.0 - 0.7 K/uL   Basophils Absolute 0.0 0.0 - 0.1 K/uL   RBC Morphology TARGET CELLS   Rapid HIV screen (HIV 1/2 Ab+Ag)     Status: None   Collection Time: 08/25/15  3:06 PM  Result  Value Ref Range   HIV-1 P24 Antigen - HIV24 NON REACTIVE NON REACTIVE   HIV 1/2 Antibodies NON REACTIVE NON REACTIVE   Interpretation (HIV Ag Ab)      A non reactive test result means that HIV 1 or HIV 2 antibodies and HIV 1 p24 antigen were not detected in the specimen.   Results for orders placed or performed during the hospital encounter of 08/25/15 (from the past 24 hour(s))  Urinalysis, Routine w reflex microscopic (not at [facility])     Status: Abnormal   Collection Time: 08/25/15 12:20 PM  Result Value Ref Range   Color, Urine YELLOW YELLOW   APPearance CLEAR CLEAR   Specific Gravity, Urine 1.015 1.005 - 1.030   pH 6.5 5.0 - 8.0   Glucose, UA NEGATIVE NEGATIVE mg/dL   Hgb urine dipstick TRACE (A) NEGATIVE   Bilirubin Urine NEGATIVE NEGATIVE   Ketones, ur NEGATIVE NEGATIVE mg/dL   Protein, ur NEGATIVE NEGATIVE mg/dL   Urobilinogen, UA 0.2 0.0 - 1.0 mg/dL   Nitrite NEGATIVE NEGATIVE   Leukocytes, UA NEGATIVE NEGATIVE  Urine microscopic-add on     Status: Abnormal   Collection Time: 08/25/15 12:20 PM  Result Value Ref Range   Squamous Epithelial / LPF RARE RARE   WBC, UA 0-2 <3 WBC/hpf   RBC / HPF 7-10 <3 RBC/hpf   Bacteria, UA FEW (A) RARE  Pregnancy, urine POC     Status: None   Collection Time: 08/25/15 12:36 PM  Result Value Ref Range   Preg Test, Ur NEGATIVE NEGATIVE  Wet prep, genital     Status: Abnormal   Collection Time: 08/25/15  2:55 PM  Result Value Ref Range   Yeast Wet Prep HPF POC NONE SEEN NONE SEEN   Trich, Wet Prep NONE SEEN NONE SEEN   Clue Cells Wet Prep HPF POC MANY (A) NONE SEEN   WBC, Wet Prep HPF POC FEW (A) NONE SEEN  CBC with Differential     Status: Abnormal   Collection Time: 08/25/15  3:06 PM   Result Value Ref Range   WBC 6.8 4.0 - 10.5 K/uL   RBC 3.67 (L) 3.87 - 5.11 MIL/uL   Hemoglobin 9.7 (L) 12.0 - 15.0 g/dL   HCT 31.0 (L) 36.0 - 46.0 %   MCV 84.5 78.0 - 100.0 fL   MCH 26.4 26.0 - 34.0 pg   MCHC 31.3 30.0 - 36.0 g/dL   RDW 20.6 (H) 11.5 - 15.5 %   Platelets 259 150 - 400 K/uL   Neutrophils Relative % 52 43 - 77 %   Lymphocytes Relative 38 12 - 46 %   Monocytes Relative 8 3 - 12 %   Eosinophils Relative 2 0 - 5 %   Basophils Relative 0 0 - 1 %   Other 0 %   Neutro Abs 3.6 1.7 - 7.7 K/uL   Lymphs Abs 2.6 0.7 - 4.0 K/uL   Monocytes Absolute 0.5 0.1 - 1.0 K/uL   Eosinophils Absolute 0.1 0.0 - 0.7 K/uL   Basophils Absolute 0.0 0.0 - 0.1 K/uL   RBC Morphology TARGET CELLS   Rapid HIV screen (HIV 1/2 Ab+Ag)     Status: None   Collection Time: 08/25/15  3:06 PM  Result Value Ref Range   HIV-1 P24 Antigen - HIV24 NON REACTIVE NON REACTIVE   HIV 1/2 Antibodies NON REACTIVE NON REACTIVE   Interpretation (HIV Ag Ab)      A non reactive test result means that  HIV 1 or HIV 2 antibodies and HIV 1 p24 antigen were not detected in the specimen.   Assessment and Plan  Pelvic pain- Ibuprofen 800mg  Rx Fibroid uterus- f/u with Dr. Ruthann Cancer [facility]- Rx Flagyl 500mg    LINEBERRY,SUSAN 08/25/2015, 2:21 PM

## 2015-08-26 LAB — RPR: RPR Ser Ql: NONREACTIVE

## 2015-08-26 LAB — GC/CHLAMYDIA PROBE AMP (~~LOC~~) NOT AT ARMC
Chlamydia: NEGATIVE
Neisseria Gonorrhea: NEGATIVE

## 2015-08-28 LAB — URINE CULTURE
Culture: >=100000
Special Requests: NORMAL

## 2015-12-15 ENCOUNTER — Other Ambulatory Visit: Payer: Self-pay | Admitting: Obstetrics

## 2015-12-15 DIAGNOSIS — D259 Leiomyoma of uterus, unspecified: Secondary | ICD-10-CM

## 2015-12-24 ENCOUNTER — Ambulatory Visit
Admission: RE | Admit: 2015-12-24 | Discharge: 2015-12-24 | Disposition: A | Discharge status: Home / Self Care | Payer: Medicaid Other | Source: Ambulatory Visit | Attending: Obstetrics | Admitting: Obstetrics

## 2015-12-24 ENCOUNTER — Other Ambulatory Visit (HOSPITAL_COMMUNITY): Payer: Self-pay | Admitting: Interventional Radiology

## 2015-12-24 DIAGNOSIS — D259 Leiomyoma of uterus, unspecified: Secondary | ICD-10-CM

## 2015-12-24 NOTE — Consult Note (Signed)
Chief Complaint: Patient was seen in consultation today for symptomatically uterine fibroids  Referring Physician(s): Marshall,Bernard A  History of Present Illness: [name] is a 40 y.o. (G4, P4) female without any significant past medical history who presents to the interventional radiology clinic for evaluation of potential percutaneous treatment options for symptomatically uterine fibroids. The patient is unaccompanied and serves as her own historian.  The patient reports a long history of menorrhagia though states this has significant late worsened recently. The patient states that her cycle is currently regular occurring every 28 days and lasting approximately 7 days, 3 days of which are associated with severe menorrhagia including the passage of clots and necessitating the changing of the both pads and tampons every 45 minutes to hour. The patient also reports nearly daily spotting between her cycles.   The patient has been diagnosed with anemia otherwise never received a blood transfusion. The patient has been prescribed iron supplementation though is been noncompliant with this medication.  She denies perimenopausal symptoms.  The patient underwent a pelvic ultrasound on 07/14/2015 which demonstrates an approximately 3.3 cm intramural fibroid within the uterine fundus.  Patient also admits to bulk symptoms including frequent urination, abdominal pressure and occasional abdominal bloating. The patient is post bilateral tubal ligation and has no plans for future pregnancies.  The patient underwent a Pap smear and endometrial biopsy both of which were negative.  At the present time, the patient wishes to avoid a surgical intervention.  Past Medical History  Diagnosis Date  . Heart murmur     Past Surgical History  Procedure Laterality Date  . Cesarean section    . Tubal ligation      Allergies: Review of patient's allergies indicates no known  allergies.  Medications: Prior to Admission medications   Medication Sig Start Date End Date Taking? Authorizing Provider  ALPRAZolam (XANAX) 0.25 MG tablet Take 0.25 mg by mouth 3 (three) times daily as needed for anxiety.   Yes Historical Provider, MD  ibuprofen (ADVIL,MOTRIN) 800 MG tablet Take 1 tablet (800 mg total) by mouth 3 (three) times daily. 08/25/15  Yes West Pugh, NP  Vitamin D, Ergocalciferol, (DRISDOL) 50000 UNITS CAPS capsule Take 50,000 Units by mouth every 7 (seven) days.   Yes Historical Provider, MD  metroNIDAZOLE (FLAGYL) 500 MG tablet Take 1 tablet (500 mg total) by mouth 2 (two) times daily. Patient not taking: Reported on 12/24/2015 08/25/15   West Pugh, NP  nitrofurantoin, macrocrystal-monohydrate, (MACROBID) 100 MG capsule Take 1 capsule (100 mg total) by mouth 2 (two) times daily. Patient not taking: Reported on 12/24/2015 08/25/15   West Pugh, NP  tiZANidine (ZANAFLEX) 4 MG tablet Take 4 mg by mouth every 6 (six) hours as needed for muscle spasms. Reported on 12/24/2015    Historical Provider, MD     No family history on file.  Social History   Social History  . Marital Status: Divorced    Spouse Name: N/A  . Number of Children: N/A  . Years of Education: N/A   Social History Main Topics  . Smoking status: Current Every Day Smoker -- 0.50 packs/day    Types: Cigarettes  . Smokeless tobacco: Never Used  . Alcohol Use: No  . Drug Use: No  . Sexual Activity: Not on file   Other Topics Concern  . Not on file   Social History Narrative    ECOG Status: 1 - Symptomatic but completely ambulatory  Review of Systems: A 12 point ROS discussed and pertinent positives are indicated in the HPI above.  All other systems are negative.  Review of Systems  Constitutional: Negative for fatigue.  Respiratory: Negative.   Cardiovascular: Negative.   Gastrointestinal: Positive for abdominal distention.  Genitourinary: Positive for urgency,  frequency, vaginal bleeding, menstrual problem and pelvic pain. Negative for dysuria, flank pain and decreased urine volume.    Vital Signs: BP 128/64 mmHg  Pulse 57  Temp(Src) 98.8 F (37.1 C) (Oral)  Resp 14  Ht 5\' 4"  (1.626 m)  Wt 165 lb (74.844 kg)  BMI 28.31 kg/m2  SpO2 100%  LMP 11/28/2015 (Approximate)  Physical Exam  Constitutional: She appears well-developed and well-nourished.  HENT:  Head: Normocephalic and atraumatic.  Cardiovascular: Normal rate, regular rhythm and intact distal pulses.   Easily palpable right common femoral and dorsalis pedis pulses  Pulmonary/Chest: Effort normal and breath sounds normal.  Abdominal: Soft. Bowel sounds are normal. She exhibits mass. She exhibits no distension. There is tenderness.  - I'm able to palpate the uterine fundus approximately 3 finger breaths below the umbilicus. - The patient reports mild abdominal pain with palpation of the right lower abdominal quadrant.  Psychiatric: She has a normal mood and affect. Her behavior is normal.  Nursing note and vitals reviewed.  Imaging:  The patient underwent a pelvic ultrasound on 07/14/2015 which demonstrates an approximately 3.3 cm intramural fibroid within the uterine fundus.  Labs:  CBC:  Recent Labs  05/27/15 1627 08/25/15 1506  WBC 7.3 6.8  HGB 8.7* 9.7*  HCT 28.8* 31.0*  PLT 207 259    COAGS: No results for input(s): INR, APTT in the last 8760 hours.  BMP:  Recent Labs  05/27/15 1627  NA 138  K 3.8  CL 108  CO2 25  GLUCOSE 89  BUN <5*  CALCIUM 9.0  CREATININE 0.77  GFRNONAA >60  GFRAA >60    LIVER FUNCTION TESTS:  Recent Labs  05/27/15 1627  BILITOT 0.3  AST 19  ALT 16  ALKPHOS 88  PROT 7.0  ALBUMIN 3.6    Assessment and Plan:  [name] is a 40 y.o. (G4, P4, post BTL) female without any significant past medical history who presents to the interventional radiology clinic for evaluation of potential percutaneous treatment options  for symptomatically uterine fibroids. The patient underwent a pelvic ultrasound on 07/14/2015 which demonstrates an approximately 3.3 cm intramural fibroid within the uterine fundus.  The patient's primary symptom otology related to her fibroids is severe menorrhagia including the passage of clots and nearly daily spotting.  Prolonged conversations were held with the patient regarding alternative to treatment including conservative therapy, hormone therapy and hysterectomy. At the present time, the patient wishes to undergo a procedure though wishes to avoid a surgical intervention.  As such, prolonged conversations were held with the patient regarding the benefits and risks (including but not limited to bleeding, vessel injury, infection, nontarget embolization, and the development of recurrent symptoms prior to the onset of menopause) of uterine artery embolization. Following this prolonged and detailed discussion, the patient wishes to pursue UFE.  As such, a contrast enhanced pelvic MRI will be obtained to better evaluate the fibroid burden as well as to ensure fibroid enhancement.  Following the acquisition of this pelvic MRI, the fibroid embolization will be performed at [facility]. The procedure will entail a 24-hour admission to the hospital for continued observation and PCA usage.  Of note, the patient  underwent a Pap smear and endometrial biopsy both of which were negative.  Thank you for this interesting consult.  I greatly enjoyed meeting [name] and look forward to participating in her care.  A copy of this report was sent to the requesting provider on this date.  Electronically Signed: Sandi Mariscal 12/24/2015, 9:09 AM   I spent a total of 30 Minutes in face to face in clinical consultation, greater than 50% of which was counseling/coordinating care for symptomatic uterine fibroids

## 2015-12-29 ENCOUNTER — Encounter (HOSPITAL_COMMUNITY): Payer: Self-pay | Admitting: Emergency Medicine

## 2015-12-29 ENCOUNTER — Emergency Department (HOSPITAL_COMMUNITY): Payer: Medicaid Other

## 2015-12-29 ENCOUNTER — Emergency Department (HOSPITAL_COMMUNITY)
Admission: EM | Admit: 2015-12-29 | Discharge: 2015-12-29 | Disposition: A | Discharge status: Home / Self Care | Payer: Medicaid Other | Attending: Physician Assistant | Admitting: Physician Assistant

## 2015-12-29 DIAGNOSIS — F17210 Nicotine dependence, cigarettes, uncomplicated: Secondary | ICD-10-CM | POA: Diagnosis not present

## 2015-12-29 DIAGNOSIS — Z9889 Other specified postprocedural states: Secondary | ICD-10-CM | POA: Diagnosis not present

## 2015-12-29 DIAGNOSIS — R011 Cardiac murmur, unspecified: Secondary | ICD-10-CM | POA: Diagnosis not present

## 2015-12-29 DIAGNOSIS — Z9851 Tubal ligation status: Secondary | ICD-10-CM | POA: Diagnosis not present

## 2015-12-29 DIAGNOSIS — R1031 Right lower quadrant pain: Secondary | ICD-10-CM | POA: Insufficient documentation

## 2015-12-29 DIAGNOSIS — G8929 Other chronic pain: Secondary | ICD-10-CM | POA: Diagnosis not present

## 2015-12-29 DIAGNOSIS — Z86018 Personal history of other benign neoplasm: Secondary | ICD-10-CM | POA: Insufficient documentation

## 2015-12-29 DIAGNOSIS — N941 Unspecified dyspareunia: Secondary | ICD-10-CM | POA: Insufficient documentation

## 2015-12-29 DIAGNOSIS — Z791 Long term (current) use of non-steroidal anti-inflammatories (NSAID): Secondary | ICD-10-CM | POA: Diagnosis not present

## 2015-12-29 DIAGNOSIS — N939 Abnormal uterine and vaginal bleeding, unspecified: Secondary | ICD-10-CM | POA: Diagnosis not present

## 2015-12-29 DIAGNOSIS — R109 Unspecified abdominal pain: Secondary | ICD-10-CM | POA: Diagnosis present

## 2015-12-29 LAB — WET PREP, GENITAL
Clue Cells Wet Prep HPF POC: NONE SEEN
Sperm: NONE SEEN
Trich, Wet Prep: NONE SEEN
Yeast Wet Prep HPF POC: NONE SEEN

## 2015-12-29 LAB — CBC
HCT: 30 % — ABNORMAL LOW (ref 36.0–46.0)
Hemoglobin: 9.7 g/dL — ABNORMAL LOW (ref 12.0–15.0)
MCH: 29.5 pg (ref 26.0–34.0)
MCHC: 32.3 g/dL (ref 30.0–36.0)
MCV: 91.2 fL (ref 78.0–100.0)
Platelets: 263 10*3/uL (ref 150–400)
RBC: 3.29 MIL/uL — ABNORMAL LOW (ref 3.87–5.11)
RDW: 15.3 % (ref 11.5–15.5)
WBC: 6.2 10*3/uL (ref 4.0–10.5)

## 2015-12-29 LAB — URINALYSIS, ROUTINE W REFLEX MICROSCOPIC
Bilirubin Urine: NEGATIVE
Glucose, UA: NEGATIVE mg/dL
Ketones, ur: NEGATIVE mg/dL
Leukocytes, UA: NEGATIVE
Nitrite: NEGATIVE
Protein, ur: NEGATIVE mg/dL
Specific Gravity, Urine: 1.005 (ref 1.005–1.030)
pH: 6.5 (ref 5.0–8.0)

## 2015-12-29 LAB — URINE MICROSCOPIC-ADD ON

## 2015-12-29 LAB — COMPREHENSIVE METABOLIC PANEL
ALT: 14 U/L (ref 14–54)
AST: 19 U/L (ref 15–41)
Albumin: 3.9 g/dL (ref 3.5–5.0)
Alkaline Phosphatase: 60 U/L (ref 38–126)
Anion gap: 7 (ref 5–15)
BUN: 7 mg/dL (ref 6–20)
CO2: 24 mmol/L (ref 22–32)
Calcium: 9.1 mg/dL (ref 8.9–10.3)
Chloride: 109 mmol/L (ref 101–111)
Creatinine, Ser: 0.78 mg/dL (ref 0.44–1.00)
GFR calc Af Amer: >60 mL/min (ref >60)
GFR calc non Af Amer: >60 mL/min (ref >60)
Glucose, Bld: 107 mg/dL — ABNORMAL HIGH (ref 65–99)
Potassium: 3.4 mmol/L — ABNORMAL LOW (ref 3.5–5.1)
Sodium: 140 mmol/L (ref 135–145)
Total Bilirubin: 0.3 mg/dL (ref 0.3–1.2)
Total Protein: 7.1 g/dL (ref 6.5–8.1)

## 2015-12-29 LAB — LIPASE, BLOOD: Lipase: 22 U/L (ref 11–51)

## 2015-12-29 LAB — I-STAT BETA HCG BLOOD, ED (MC, WL, AP ONLY): I-stat hCG, quantitative: <5 m[IU]/mL (ref <5)

## 2015-12-29 MED ORDER — IBUPROFEN 800 MG PO TABS
800.0000 mg | ORAL_TABLET | Freq: Once | ORAL | Status: AC
  Administered 2015-12-29: 800 mg via ORAL
  Filled 2015-12-29: qty 1

## 2015-12-29 MED ORDER — OXYCODONE-ACETAMINOPHEN 5-325 MG PO TABS: 1.0000 | ORAL_TABLET | Freq: Four times a day (QID) | ORAL | Status: DC | PRN

## 2015-12-29 MED ORDER — IBUPROFEN 800 MG PO TABS: 800.0000 mg | ORAL_TABLET | Freq: Three times a day (TID) | ORAL | Status: DC

## 2015-12-29 MED ORDER — OXYCODONE-ACETAMINOPHEN 5-325 MG PO TABS
1.0000 | ORAL_TABLET | Freq: Once | ORAL | Status: AC
  Administered 2015-12-29: 1 via ORAL
  Filled 2015-12-29: qty 1

## 2015-12-29 NOTE — Discharge Instructions (Signed)
We are unsure what causes your pain but your labs, exam and [place] were all normal.  PLease use iburpofen to help with your symtpoms.  And follow up with your OBGYN.  Attached are a list of doctors in the area.    Emergency Department Resource Guide 1) Find a Doctor and Pay Out of Pocket Although you won't have to find out who is covered by your insurance plan, it is a good idea to ask around and get recommendations. You will then need to call the office and see if the doctor you have chosen will accept you as a new patient and what types of options they offer for patients who are self-pay. Some doctors offer discounts or will set up payment plans for their patients who do not have insurance, but you will need to ask so you aren't surprised when you get to your appointment.  2) Contact Your Local Health Department Not all health departments have doctors that can see patients for sick visits, but many do, so it is worth a call to see if yours does. If you don't know where your local health department is, you can check in your phone book. The CDC also has a tool to help you locate your state's health department, and many state websites also have listings of all of their local health departments.  3) Find a [facility] If your illness is not likely to be very severe or complicated, you may want to try a walk in clinic. These are popping up all over the country in pharmacies, drugstores, and shopping centers. They're usually staffed by nurse practitioners or physician assistants that have been trained to treat common illnesses and complaints. They're usually fairly quick and inexpensive. However, if you have serious medical issues or chronic medical problems, these are probably not your best option.  No Primary Care Doctor: - Call Health Connect at  [phone] - they can help you locate a primary care doctor that  accepts your insurance, provides certain services, etc. - Physician Referral Service-  [phone]  Chronic Pain Problems: Organization         Address  Phone   Notes  [facility]  [phone] Patients need to be referred by their primary care doctor.   Medication Assistance: Organization         Address  Phone   Notes  [facility] Medication [facility] [address] [place], [place] [address] [phone] --Must be a resident of [facility] -- Must have NO insurance coverage whatsoever (no Medicaid/ Medicare, etc.) -- The pt. MUST have a primary care doctor that directs their care regularly and follows them in the community   [facility]ist  [phone]   [employer]  [phone]    Agencies that provide inexpensive medical care: Organization         Address  Phone   Notes  [place]  [phone]   [facility]    [phone]   [facility] [place], [place] [address] [phone]   [place] [address], [place] [phone]   Planned Parenthood    [phone]   [facility]    [phone]   [place] and [place] [address], [place] Phone:  [phone], Fax:  [phone] Hours of Operation:  9 am - 6 pm, M-F.  Also  accepts Medicaid/Medicare and self-pay.  [facility] for [place] [address], [address], [place] Phone: [phone], Fax: [phone]. Hours of Operation:  8:30 am - 5:30 pm, M-F.  Also accepts Medicaid and self-pay.  [facility] [place] [address] [place] Phone: [phone]   [place], [place], [place] [phone], Ext. [phone] Mondays & Thursdays: 7-9 AM.  First 15 patients are seen on a first come, first serve basis.    [place] Providers:  Organization         Address  Phone   Notes  [facility] [address],  [place] [phone] Also accepts self-pay patients.  [facility] [id number] [place], [place]  [phone]   [place], [address], [place] [phone]   [facility] [address], [place] [phone]   Lucianne Lei [address], [place]   [phone] Only accepts [place] Access [place] patients after they have their name applied to their card.   Self-Pay (no insurance) in [facility]:  Organization         Address  Phone   Notes  Sickle Cell Patients, [facility] Internal Medicine [place] [phone]   [facility] [place] [phone]   [facility] [place]  [place], [place], [place] [phone]   [facility]/Dr. Osei-Bonsu  [address] [place] or [address], [place] [phone] Phone number for both [place] and [place] locations is the same.  [facility] [address], [place] [phone]   [facility] [address] [place] or [address] [phone]   [facility] [address] [place] [phone], phone; [phone], fax Sees patients 1st and 3rd Saturday of every month.  Must not qualify for public or private insurance (i.e. Medicaid, Medicare, [place] Health Choice, Veterans' Benefits)  Household income should be no more than 200% of the poverty level The clinic cannot treat you if you are pregnant or think you are pregnant  Sexually transmitted diseases are not treated at the clinic.    Dental Care: Organization         Address  Phone  Notes  [facility] Department of [facility] [place] [phone] Accepts children up to age 27 who are enrolled in  [place] or [place]; pregnant women with a Medicaid card; and children who have applied for Medicaid or [place] Health Choice, but were declined, whose parents can pay a reduced fee at time of service.  [facility] Department of [facility]  [address], [place] [phone] Accepts children up to age 31 who are enrolled in [place] or [place]; pregnant women with a Medicaid card; and children who have applied for Medicaid or [place] Health Choice, but were declined, whose parents can pay a reduced fee at time of service.  [facility] Access PROGRAM  [place] [phone] Patients are seen by appointment only. Walk-ins are not accepted. [place] will see patients 13 years of age and older. Monday - Tuesday (8am-5pm)  Most Wednesdays (8:30-5pm) $30 per visit, cash only  [facility] Adult [employer] PROGRAM  [address], [facility] [phone] Patients are seen by appointment only. Walk-ins are not accepted. [place] will see patients 16 years of age and older. One Wednesday Evening (Monthly: Volunteer Based).  $30 per visit, cash only  [place]  [phone] for adults; Children under age 34, call Graduate Pediatric Dentistry at [phone]. Children aged 22-14, please call [phone] to request a pediatric application.  Dental services are provided in all areas of dental care including fillings, crowns and bridges, complete and partial dentures, implants, gum treatment, root canals, and extractions. Preventive care is also provided. Treatment is provided to both adults and children. Patients are selected via a lottery and there is often a waiting list.   [facility] [address], [place]  [phone] [email or web address]   [facility] [address] [place], [place] [phone], Ext. [phone] Second and Fourth Thursday of each month, opens at 6:30  AM; Clinic ends at 9 AM.  Patients are seen on a first-come first-served basis, and a limited number are seen during each clinic.   [facility]  [address] [place] Danker [place], [place] [phone]   Eligibility Requirements You must have lived in [place], [place], or [place] counties for at least the last three months.   You cannot be eligible for state or federal sponsored [employer], including [employer], [place], or [employer].   You generally cannot be eligible for healthcare insurance through your employer.    How to apply: Eligibility screenings are held every Tuesday and Wednesday afternoon from 1:00 pm until 4:00 pm. You do not need an appointment for the interview!  [facility] [address] [place], [place]   [place]  [place] Department  [place]  [phone]    Behavioral Health Resources in the Community: Intensive Outpatient Programs Organization         Address  Phone  Notes  [place]. [address], [place], [place] [phone]   [facility] Outpatient [address], [place], [place]   ADS: Alcohol & Drug Svcs [address] [place], [place]   [place] [address]  [place], [place] or [phone]   Substance Abuse Resources Organization         Address  Phone  Notes  Alcohol and Drug Services  [phone]   [place]  [phone]   [facility] [place]   Chinita Pester  [phone]   Residential & Outpatient Substance Abuse Program  [phone]   Psychological Services Organization         Address  Phone  Notes  [place] [facility] [place]  [place]  [phone]   [place] [address] [place] or  [phone]    Mobile Crisis Teams Organization         Address  Phone  Notes  [facility], Mobile Crisis Care Unit  [phone]   [facility]  [address] [place], [place]   [place] Levels [address] [place] [phone]    Self-Help/Support Groups Organization         Address  Phone  Notes  Mental Health Assoc. of [place] - variety of support groups  [place] Call for more information  Narcotics Anonymous (NA), [facility] [address], [employer] [place]  2 meetings at this location   Special educational needs teacher         Address  Phone  Notes  ASAP Residential Treatment [place],    [place]  [phone]   [facility]  [address] [place] [id number], [place], [place]   [place], [place] [phone] Admissions: 8am-3pm M-F  Incentives Substance [place] [address]    [place], [place] [phone]   [facility] [address] [place], [place], [place]   [facility] [address],  [place], [place]   [facility] Programs - Intensive Outpatient [address] Kristeen [address], [place], [place]   [facility] ([place] [address]  [place], [place] [phone] or [phone]   [facility] Services (RTS) [address] [place], [place] Accepts Medicaid  [facility] [place] [address],  [place] [phone] Substance Abuse/Addiction Treatment   [facility] The Organization         Address  Phone  Notes  [facility]  [phone]   Domenic Schwab, PhD [address] Arlis [place], [place]   [phone] or [phone]   [place] and [place], [place] [phone]   [facility] [address],  [place], [place] [phone] Insurance/Medicaid/sponsorship through [facility] and Families [address], [address] [place]                                    [place], [place] [phone] [place] [address] [place], [place] [phone]    Dr. Adele Schilder  [phone]   [facility] Dept. 1) [address], [place] 2) [place] 3)  [place] [address], [place] [phone]  [phone]   [place] [phone] or [phone] (After Hours)

## 2015-12-29 NOTE — ED Notes (Signed)
MD at bedside. Performing pelvic

## 2015-12-29 NOTE — ED Notes (Signed)
MD at bedside. 

## 2015-12-29 NOTE — ED Notes (Addendum)
Pt report intermittent RLQ pain since last week along with R shoulder pain. Denies any injuries. No n/v/d. Pt being worked up for uterine leioma by PCP

## 2015-12-29 NOTE — Progress Notes (Signed)
Entered in d/c instructions Pa, [facility] Schedule an appointment as soon as possible for a visit This is your assigned Medicaid [place] access doctor If you prefer another contact [place] assigned your doctor *You may receive a bill if you go to any family Dr not assigned to you [place] [address] [phone] Medicaid [place] Access Covered Patient [place]: [place], [place] [address] [email or web address] USE THIS [place] As a Medicaid client you MUST contact DSS/SSI each time you change address, move to another [place] or another state to keep your address updated [place] Medicaid Transportation to Dr appts if you are have full Medicaid: [phone], [phone]

## 2015-12-29 NOTE — ED Provider Notes (Signed)
CSN: [id number]     Arrival date & time 12/29/15  0911 History   First MD Initiated Contact with Patient 12/29/15 1129     Chief Complaint  Patient presents with  . Abdominal Pain  . Shoulder Pain     (Consider location/radiation/quality/duration/timing/severity/associated sxs/prior Treatment) HPI   Patient is a 40 year old female with history of multiple C-sections and tubal ligation and fibroids. She is presenting with pain in the right groin/pelvis. This went on for the last couple days. She just finished her period yesterday. She's been told she has chronic pain from multiple surgeries and fibroids. She had no fever nausea vomiting diarrhea. She appears in no acute distress.  She is married. No new sexual contacts. Chronic dyspareunia. No new vaginal discharge.  Past Medical History  Diagnosis Date  . Heart murmur    Past Surgical History  Procedure Laterality Date  . Cesarean section    . Tubal ligation     History reviewed. No pertinent family history. Social History  Substance Use Topics  . Smoking status: Current Every Day Smoker -- 0.50 packs/day    Types: Cigarettes  . Smokeless tobacco: Never Used  . Alcohol Use: No   OB History    No data available     Review of Systems  Constitutional: Negative for activity change.  Respiratory: Negative for shortness of breath.   Cardiovascular: Negative for chest pain.  Gastrointestinal: Positive for abdominal pain.  Genitourinary: Positive for vaginal bleeding and dyspareunia. Negative for dysuria, urgency, frequency, hematuria, flank pain, decreased urine volume and genital sores.  Musculoskeletal: Negative for back pain.      Allergies  Review of patient's allergies indicates no known allergies.  Home Medications   Prior to Admission medications   Medication Sig Start Date End Date Taking? Authorizing Provider  acetaminophen (TYLENOL) 500 MG tablet Take 1,000 mg by mouth every 6 (six) hours as needed for  moderate pain or headache.   Yes Historical Provider, MD  ALPRAZolam (XANAX) 0.25 MG tablet Take 0.25 mg by mouth 3 (three) times daily as needed for anxiety.   Yes Historical Provider, MD  ibuprofen (ADVIL,MOTRIN) 800 MG tablet Take 1 tablet (800 mg total) by mouth 3 (three) times daily. 08/25/15  Yes West Pugh, NP  Vitamin D, Ergocalciferol, (DRISDOL) 50000 UNITS CAPS capsule Take 50,000 Units by mouth every 7 (seven) days.   Yes Historical Provider, MD  ibuprofen (ADVIL,MOTRIN) 800 MG tablet Take 1 tablet (800 mg total) by mouth 3 (three) times daily. 12/29/15   Neeva Trew Lyn Jayveon Convey, MD  metroNIDAZOLE (FLAGYL) 500 MG tablet Take 1 tablet (500 mg total) by mouth 2 (two) times daily. Patient not taking: Reported on 12/24/2015 08/25/15   [place] Pugh, NP  nitrofurantoin, macrocrystal-monohydrate, (MACROBID) 100 MG capsule Take 1 capsule (100 mg total) by mouth 2 (two) times daily. Patient not taking: Reported on 12/24/2015 08/25/15   [place] Pugh, NP  oxyCODONE-acetaminophen (PERCOCET/ROXICET) 5-325 MG tablet Take 1 tablet by mouth every 6 (six) hours as needed for severe pain. 12/29/15   Darianne Muralles Lyn Abhishek Levesque, MD   BP 133/84 mmHg  Pulse 61  Temp(Src) 98.1 F (36.7 C) (Oral)  Resp 18  SpO2 100%  LMP 11/28/2015 (Approximate) Physical Exam  Constitutional: She is oriented to person, place, and time. She appears well-developed and well-nourished.  HENT:  Head: Normocephalic and atraumatic.  Eyes: Conjunctivae are normal. Right eye exhibits no discharge.  Neck: Neck supple.  Cardiovascular: Normal rate, regular rhythm and normal heart  sounds.   No murmur heard. Pulmonary/Chest: Effort normal and breath sounds normal. She has no wheezes. She has no rales.  Abdominal: Soft. She exhibits no distension. There is no tenderness.  Genitourinary: No vaginal discharge found.  Normal cervix with scant blood. No CMT.  Musculoskeletal: Normal range of motion. She exhibits no edema.   Neurological: She is oriented to person, place, and time. No cranial nerve deficit.  Skin: Skin is warm and dry. No rash noted. She is not diaphoretic.  Psychiatric: She has a normal mood and affect. Her behavior is normal.  Nursing note and vitals reviewed.   ED Course  Procedures (including critical care time) Labs Review Labs Reviewed  WET PREP, GENITAL - Abnormal; Notable for the following:    WBC, Wet Prep HPF POC FEW (*)    All other components within normal limits  COMPREHENSIVE METABOLIC PANEL - Abnormal; Notable for the following:    Potassium 3.4 (*)    Glucose, Bld 107 (*)    All other components within normal limits  CBC - Abnormal; Notable for the following:    RBC 3.29 (*)    Hemoglobin 9.7 (*)    HCT 30.0 (*)    All other components within normal limits  URINALYSIS, ROUTINE W REFLEX MICROSCOPIC (NOT AT [facility]) - Abnormal; Notable for the following:    Hgb urine dipstick TRACE (*)    All other components within normal limits  URINE MICROSCOPIC-ADD ON - Abnormal; Notable for the following:    Squamous Epithelial / LPF 0-5 (*)    Bacteria, UA RARE (*)    All other components within normal limits  LIPASE, BLOOD  I-STAT BETA HCG BLOOD, ED ([facility], [facility], AP ONLY)  GC/CHLAMYDIA PROBE AMP ([place]) NOT AT [facility]    Imaging Review US Transvaginal Non-ob  12/29/2015  CLINICAL DATA:  Chronic right lower quadrant abdominal pain. EXAM: TRANSABDOMINAL AND TRANSVAGINAL ULTRASOUND OF PELVIS TECHNIQUE: Both transabdominal and transvaginal ultrasound examinations of the pelvis were performed. Transabdominal technique was performed for global imaging of the pelvis including uterus, ovaries, adnexal regions, and pelvic cul-de-sac. It was necessary to proceed with endovaginal exam following the transabdominal exam to visualize the endometrium and ovaries. COMPARISON:  July 14, 2015. FINDINGS: Uterus Measurements: 13.7.8 x 5.7 cm. Focal echogenicity is noted anteriorly and uterine fundus  which may represent calcification related to small uterine fibroid. Endometrium Thickness: 19 mm which is abnormally thick. No focal abnormality seen. Right ovary Measurements: 2.7 x 1.8 x 1.6 cm. Follicular cysts are noted. Left ovary Not visualized due to overlying bowel gas. Other findings No abnormal free fluid. IMPRESSION: Endometrial thickness is considered abnormal. Consider follow-up by [place] in 6-8 weeks, during the week immediately following menses (exam timing is critical). Left ovary is not visualized due to overlying bowel gas. Right ovary appears normal. Electronically Signed   By: Marijo Conception, M.D.   On: 12/29/2015 14:32   US Pelvis Complete  12/29/2015  CLINICAL DATA:  Chronic right lower quadrant abdominal pain. EXAM: TRANSABDOMINAL AND TRANSVAGINAL ULTRASOUND OF PELVIS TECHNIQUE: Both transabdominal and transvaginal ultrasound examinations of the pelvis were performed. Transabdominal technique was performed for global imaging of the pelvis including uterus, ovaries, adnexal regions, and pelvic cul-de-sac. It was necessary to proceed with endovaginal exam following the transabdominal exam to visualize the endometrium and ovaries. COMPARISON:  July 14, 2015. FINDINGS: Uterus Measurements: 13.7.8 x 5.7 cm. Focal echogenicity is noted anteriorly and uterine fundus which may represent calcification related to small uterine  fibroid. Endometrium Thickness: 19 mm which is abnormally thick. No focal abnormality seen. Right ovary Measurements: 2.7 x 1.8 x 1.6 cm. Follicular cysts are noted. Left ovary Not visualized due to overlying bowel gas. Other findings No abnormal free fluid. IMPRESSION: Endometrial thickness is considered abnormal. Consider follow-up by [place] in 6-8 weeks, during the week immediately following menses (exam timing is critical). Left ovary is not visualized due to overlying bowel gas. Right ovary appears normal. Electronically Signed   By: Marijo Conception, M.D.   On: 12/29/2015 14:32    I have personally reviewed and evaluated these images and lab results as part of my medical decision-making.   EKG Interpretation None      MDM   Final diagnoses:  RLQ abdominal pain    Patient is a 40 year old female with history of multiple C-sections, tubal ligation, multiple fibroids, chronic pain. She presents today with her chronic abdominal/groin pain. Tenderness is located in the right groin. Patient reports it's low and steady. It's been going on for multiple days. She's had no discharge, no fever, chronic dyspareunia.  I think this is likely pain from her fibroids. Vaginal exam shows no CMT tenderness. No evidence of infection however we'll send GC and chlamydia. We'll get transvaginal ultrasound to rule out ovarian pathology. However the absence of that I think is likely chronic fibroid pain.   3:50 PM [place] negative. She has just finished cycle. Vital signs normal, eating and drinking normally.  Will have her follow up with PCP and OBGYN for thickening.       Gwendolen Hewlett Julio Alm, MD 12/29/15 1550

## 2015-12-30 LAB — GC/CHLAMYDIA PROBE AMP (~~LOC~~) NOT AT ARMC
Chlamydia: NEGATIVE
Neisseria Gonorrhea: NEGATIVE

## 2016-01-06 ENCOUNTER — Ambulatory Visit (HOSPITAL_COMMUNITY)
Admission: RE | Admit: 2016-01-06 | Discharge: 2016-01-06 | Disposition: A | Discharge status: Home / Self Care | Payer: Medicaid Other | Source: Ambulatory Visit | Attending: Interventional Radiology | Admitting: Interventional Radiology

## 2016-01-06 ENCOUNTER — Encounter (HOSPITAL_COMMUNITY): Payer: Self-pay | Admitting: *Deleted

## 2016-01-06 ENCOUNTER — Other Ambulatory Visit (HOSPITAL_COMMUNITY): Payer: Self-pay | Admitting: Interventional Radiology

## 2016-01-06 ENCOUNTER — Inpatient Hospital Stay (HOSPITAL_COMMUNITY)
Admission: AD | Admit: 2016-01-06 | Discharge: 2016-01-06 | Disposition: A | Discharge status: Home / Self Care | Payer: Medicaid Other | Source: Ambulatory Visit | Attending: Obstetrics | Admitting: Obstetrics

## 2016-01-06 DIAGNOSIS — D259 Leiomyoma of uterus, unspecified: Secondary | ICD-10-CM | POA: Insufficient documentation

## 2016-01-06 DIAGNOSIS — K5900 Constipation, unspecified: Secondary | ICD-10-CM | POA: Diagnosis not present

## 2016-01-06 DIAGNOSIS — F17210 Nicotine dependence, cigarettes, uncomplicated: Secondary | ICD-10-CM | POA: Diagnosis not present

## 2016-01-06 DIAGNOSIS — K5901 Slow transit constipation: Secondary | ICD-10-CM | POA: Diagnosis not present

## 2016-01-06 DIAGNOSIS — G8929 Other chronic pain: Secondary | ICD-10-CM | POA: Diagnosis not present

## 2016-01-06 DIAGNOSIS — R102 Pelvic and perineal pain: Secondary | ICD-10-CM | POA: Diagnosis not present

## 2016-01-06 HISTORY — DX: Anemia, unspecified: D64.9

## 2016-01-06 HISTORY — DX: Benign neoplasm of connective and other soft tissue, unspecified: D21.9

## 2016-01-06 LAB — COMPREHENSIVE METABOLIC PANEL
ALT: 18 U/L (ref 14–54)
AST: 20 U/L (ref 15–41)
Albumin: 4.7 g/dL (ref 3.5–5.0)
Alkaline Phosphatase: 72 U/L (ref 38–126)
Anion gap: 10 (ref 5–15)
BUN: 5 mg/dL — ABNORMAL LOW (ref 6–20)
CO2: 26 mmol/L (ref 22–32)
Calcium: 9.1 mg/dL (ref 8.9–10.3)
Chloride: 102 mmol/L (ref 101–111)
Creatinine, Ser: 0.78 mg/dL (ref 0.44–1.00)
GFR calc Af Amer: >60 mL/min (ref >60)
GFR calc non Af Amer: >60 mL/min (ref >60)
Glucose, Bld: 129 mg/dL — ABNORMAL HIGH (ref 65–99)
Potassium: 3.5 mmol/L (ref 3.5–5.1)
Sodium: 138 mmol/L (ref 135–145)
Total Bilirubin: 0.6 mg/dL (ref 0.3–1.2)
Total Protein: 8.8 g/dL — ABNORMAL HIGH (ref 6.5–8.1)

## 2016-01-06 LAB — CBC WITH DIFFERENTIAL/PLATELET
Basophils Absolute: 0 10*3/uL (ref 0.0–0.1)
Basophils Relative: 0 %
Eosinophils Absolute: 0.1 10*3/uL (ref 0.0–0.7)
Eosinophils Relative: 1 %
HCT: 33.6 % — ABNORMAL LOW (ref 36.0–46.0)
Hemoglobin: 10.8 g/dL — ABNORMAL LOW (ref 12.0–15.0)
Lymphocytes Relative: 7 %
Lymphs Abs: 0.9 10*3/uL (ref 0.7–4.0)
MCH: 28.9 pg (ref 26.0–34.0)
MCHC: 32.1 g/dL (ref 30.0–36.0)
MCV: 89.8 fL (ref 78.0–100.0)
Monocytes Absolute: 0.3 10*3/uL (ref 0.1–1.0)
Monocytes Relative: 2 %
Neutro Abs: 11.6 10*3/uL — ABNORMAL HIGH (ref 1.7–7.7)
Neutrophils Relative %: 90 %
Platelets: 244 10*3/uL (ref 150–400)
RBC: 3.74 MIL/uL — ABNORMAL LOW (ref 3.87–5.11)
RDW: 15.5 % (ref 11.5–15.5)
WBC: 12.9 10*3/uL — ABNORMAL HIGH (ref 4.0–10.5)

## 2016-01-06 MED ORDER — HYDROMORPHONE HCL 1 MG/ML IJ SOLN
1.0000 mg | Freq: Once | INTRAMUSCULAR | Status: AC
  Administered 2016-01-06: 1 mg via INTRAMUSCULAR
  Filled 2016-01-06: qty 1

## 2016-01-06 MED ORDER — GADOBENATE DIMEGLUMINE 529 MG/ML IV SOLN
15.0000 mL | Freq: Once | INTRAVENOUS | Status: AC | PRN
  Administered 2016-01-06: 15 mL via INTRAVENOUS

## 2016-01-06 MED ORDER — POLYETHYLENE GLYCOL 3350 17 GM/SCOOP PO POWD: ORAL | Status: DC

## 2016-01-06 MED ORDER — BISACODYL 10 MG RE SUPP: 10.0000 mg | RECTAL | Status: DC | PRN

## 2016-01-06 MED ORDER — BISACODYL 10 MG RE SUPP
10.0000 mg | Freq: Once | RECTAL | Status: AC
  Administered 2016-01-06: 10 mg via RECTAL
  Filled 2016-01-06: qty 1

## 2016-01-06 NOTE — MAU Provider Note (Signed)
History     CSN: [id number]  Arrival date and time: 01/06/16 [id number]   First Provider Initiated Contact with Patient 01/06/16 0249      No chief complaint on file.  HPI Ms. TIKVAH ANCIRA is a 40 y.o. [phone] who presents to MAU today with complaint of diffuse abdominal pain since last night. The patient states that she was diagnosed with fibroids and has MRI scheduled for further evaluation. She states that she has not had a BM since Friday. She tried an enema last night at 2000 without relief. She states she also took Percocet with minimal relief. She is in severe pain. She denies fever or N/V. She is concerned that she may have a blockage in her intestines because her mother recently had the same issue. Patient had sex a few hours prior to arrival. Patient has had BTL and multiple C/S in the past. Per recent ER note, has chronic pelvic pain issues due to fibroids, although most recent US did not show significant fibroids.   OB History    Gravida Para Term Preterm AB TAB SAB Ectopic Multiple Living   4 4  4      4       Past Medical History  Diagnosis Date  . Heart murmur   . Fibroids   . Anemia     Past Surgical History  Procedure Laterality Date  . Cesarean section    . Tubal ligation    . Ankle surgery      History reviewed. No pertinent family history.  Social History  Substance Use Topics  . Smoking status: Current Every Day Smoker -- 0.50 packs/day    Types: Cigarettes  . Smokeless tobacco: Never Used  . Alcohol Use: No    Allergies: No Known Allergies  Prescriptions prior to admission  Medication Sig Dispense Refill Last Dose  . acetaminophen (TYLENOL) 500 MG tablet Take 1,000 mg by mouth every 6 (six) hours as needed for moderate pain or headache.   Past Month at Unknown time  . ibuprofen (ADVIL,MOTRIN) 800 MG tablet Take 1 tablet (800 mg total) by mouth 3 (three) times daily. 21 tablet 0 01/05/2016 at 2000  . oxyCODONE-acetaminophen (PERCOCET/ROXICET) 5-325  MG tablet Take 1 tablet by mouth every 6 (six) hours as needed for severe pain. 5 tablet 0 01/06/2016 at 0001  . Vitamin D, Ergocalciferol, (DRISDOL) 50000 UNITS CAPS capsule Take 50,000 Units by mouth every 7 (seven) days.   01/05/2016 at Unknown time  . ALPRAZolam (XANAX) 0.25 MG tablet Take 0.25 mg by mouth 3 (three) times daily as needed for anxiety.   More than a month at Unknown time  . ibuprofen (ADVIL,MOTRIN) 800 MG tablet Take 1 tablet (800 mg total) by mouth 3 (three) times daily. 21 tablet 0   . metroNIDAZOLE (FLAGYL) 500 MG tablet Take 1 tablet (500 mg total) by mouth 2 (two) times daily. (Patient not taking: Reported on 12/24/2015) 14 tablet 0 Not Taking  . nitrofurantoin, macrocrystal-monohydrate, (MACROBID) 100 MG capsule Take 1 capsule (100 mg total) by mouth 2 (two) times daily. (Patient not taking: Reported on 12/24/2015) 10 capsule 0 Not Taking    Review of Systems  Constitutional: Negative for fever and malaise/fatigue.  Gastrointestinal: Positive for abdominal pain and constipation. Negative for nausea, vomiting and diarrhea.  Genitourinary:       Neg - vaginal bleeding, discharge   Physical Exam   Blood pressure 122/60, pulse 91, temperature 98.7 F (37.1 C), temperature source Oral, resp. rate  20, height 5\' 4"  (1.626 m), weight 165 lb (74.844 kg), last menstrual period 12/28/2015.  Physical Exam  Nursing note and vitals reviewed. Constitutional: She is oriented to person, place, and time. She appears well-developed and well-nourished. No distress.  HENT:  Head: Normocephalic and atraumatic.  Cardiovascular: Normal rate.   Respiratory: Effort normal.  GI: Soft. She exhibits no distension and no mass. There is tenderness (diffuse tenderness to palpation worse in the LLQ). There is guarding. There is no rebound.  Neurological: She is alert and oriented to person, place, and time.  Skin: Skin is warm and dry. No erythema.  Psychiatric: She has a normal mood and affect.     Results for orders placed or performed during the hospital encounter of 01/06/16 (from the past 24 hour(s))  CBC with Differential/Platelet     Status: Abnormal   Collection Time: 01/06/16  3:10 AM  Result Value Ref Range   WBC 12.9 (H) 4.0 - 10.5 K/uL   RBC 3.74 (L) 3.87 - 5.11 MIL/uL   Hemoglobin 10.8 (L) 12.0 - 15.0 g/dL   HCT 33.6 (L) 36.0 - 46.0 %   MCV 89.8 78.0 - 100.0 fL   MCH 28.9 26.0 - 34.0 pg   MCHC 32.1 30.0 - 36.0 g/dL   RDW 15.5 11.5 - 15.5 %   Platelets 244 150 - 400 K/uL   Neutrophils Relative % 90 %   Neutro Abs 11.6 (H) 1.7 - 7.7 K/uL   Lymphocytes Relative 7 %   Lymphs Abs 0.9 0.7 - 4.0 K/uL   Monocytes Relative 2 %   Monocytes Absolute 0.3 0.1 - 1.0 K/uL   Eosinophils Relative 1 %   Eosinophils Absolute 0.1 0.0 - 0.7 K/uL   Basophils Relative 0 %   Basophils Absolute 0.0 0.0 - 0.1 K/uL  Comprehensive metabolic panel     Status: Abnormal   Collection Time: 01/06/16  3:10 AM  Result Value Ref Range   Sodium 138 135 - 145 mmol/L   Potassium 3.5 3.5 - 5.1 mmol/L   Chloride 102 101 - 111 mmol/L   CO2 26 22 - 32 mmol/L   Glucose, Bld 129 (H) 65 - 99 mg/dL   BUN 5 (L) 6 - 20 mg/dL   Creatinine, Ser 0.78 0.44 - 1.00 mg/dL   Calcium 9.1 8.9 - 10.3 mg/dL   Total Protein 8.8 (H) 6.5 - 8.1 g/dL   Albumin 4.7 3.5 - 5.0 g/dL   AST 20 15 - 41 U/L   ALT 18 14 - 54 U/L   Alkaline Phosphatase 72 38 - 126 U/L   Total Bilirubin 0.6 0.3 - 1.2 mg/dL   GFR calc non Af Amer >60 >60 mL/min   GFR calc Af Amer >60 >60 mL/min   Anion gap 10 5 - 15    MAU Course  Procedures None  MDM Reviewed [place] from 12/29/15, showed possible small fibroid and slightly thickened endometrium 1 mg IM Dialudid given  Soap suds enema given CBC, CMP today  Patient states minimal results with enema, still having LLQ abdominal pain and difficulty "pushing" Dulcolax suppository given Patient has had some results after suppository. She is resting comfortably prior to discharge. Her SO  states she continues to have some pain, although improved.  Assessment and Plan  A: Constipation Chronic pelvic pain  P: Discharge home Rx for Dulcolax suppositories and Miralax given to patient Warning signs for worsening condition discussed Patient advised to follow-up with Dr. Ruthann Cancer as scheduled as well  as IR for MRI as scheduled Patient may return to MAU as needed or if her condition were to change or worsen, however if constipation continues to be the reason for her pain as I suspect she should present to either [facility] or [facility] for further evaluation and management.    Luvenia Redden, PA-C  01/06/2016, 5:03 AM

## 2016-01-06 NOTE — MAU Note (Signed)
Pt scheduled for MRI in AM for pain associated with uterine fibroids. Started with sudden lower abd pain around 12MN and pt took two muscle relaxers, a laxative and one percocet without any relief.  Denies fever, vag bleeding, dysuria  or discharge. Reports she has not had a BM since last Friday the 20th of Jan.   Gave herself an emema @ 2000 last evening.

## 2016-01-06 NOTE — Discharge Instructions (Signed)
High-Fiber Diet Fiber, also called dietary fiber, is a type of carbohydrate found in fruits, vegetables, whole grains, and beans. A high-fiber diet can have many health benefits. Your health care provider may recommend a high-fiber diet to help:  Prevent constipation. Fiber can make your bowel movements more regular.  Lower your cholesterol.  Relieve hemorrhoids, uncomplicated diverticulosis, or irritable bowel syndrome.  Prevent overeating as part of a weight-loss plan.  Prevent heart disease, type 2 diabetes, and certain cancers. WHAT IS MY PLAN? The recommended daily intake of fiber includes:  38 grams for men under age 69.  28 grams for men over age 42.  16 grams for women under age 71.  86 grams for women over age 75. You can get the recommended daily intake of dietary fiber by eating a variety of fruits, vegetables, grains, and beans. Your health care provider may also recommend a fiber supplement if it is not possible to get enough fiber through your diet. WHAT DO I NEED TO KNOW ABOUT A HIGH-FIBER DIET?  Fiber supplements have not been widely studied for their effectiveness, so it is better to get fiber through food sources.  Always check the fiber content on thenutrition facts label of any prepackaged food. Look for foods that contain at least 5 grams of fiber per serving.  Ask your dietitian if you have questions about specific foods that are related to your condition, especially if those foods are not listed in the following section.  Increase your daily fiber consumption gradually. Increasing your intake of dietary fiber too quickly may cause bloating, cramping, or gas.  Drink plenty of water. Water helps you to digest fiber. WHAT FOODS CAN I EAT? Grains Whole-grain breads. Multigrain cereal. Oats and oatmeal. Brown rice. Barley. Bulgur wheat. [place]. Bran muffins. Popcorn. Rye wafer crackers. Vegetables Sweet potatoes. Spinach. Kale. Artichokes. Cabbage. Broccoli.  Green peas. Carrots. Squash. Fruits Berries. Pears. Apples. Oranges. Avocados. Prunes and raisins. Dried figs. Meats and Other Protein Sources [place], kidney, pinto, and soy beans. Split peas. Lentils. Nuts and seeds. Dairy Fiber-fortified yogurt. Beverages Fiber-fortified soy milk. Fiber-fortified orange juice. Other Fiber bars. The items listed above may not be a complete list of recommended foods or beverages. Contact your dietitian for more options. WHAT FOODS ARE NOT RECOMMENDED? Grains White bread. Pasta made with refined flour. White rice. Vegetables Fried potatoes. Canned vegetables. Well-cooked vegetables.  Fruits Fruit juice. Cooked, strained fruit. Meats and Other Protein Sources Fatty cuts of meat. Fried Sales executive or fried fish. Dairy Milk. Yogurt. Cream cheese. Sour cream. Beverages Soft drinks. Other Cakes and pastries. Butter and oils. The items listed above may not be a complete list of foods and beverages to avoid. Contact your dietitian for more information. WHAT ARE SOME TIPS FOR INCLUDING HIGH-FIBER FOODS IN MY DIET?  Eat a wide variety of high-fiber foods.  Make sure that half of all grains consumed each day are whole grains.  Replace breads and cereals made from refined flour or white flour with whole-grain breads and cereals.  Replace white rice with brown rice, bulgur wheat, or millet.  Start the day with a breakfast that is high in fiber, such as a cereal that contains at least 5 grams of fiber per serving.  Use beans in place of meat in soups, salads, or pasta.  Eat high-fiber snacks, such as berries, raw vegetables, nuts, or popcorn.   This information is not intended to replace advice given to you by your health care provider. Make sure you discuss  any questions you have with your health care provider.   Document Released: 11/28/2005 Document Revised: 12/19/2014 Document Reviewed: 05/13/2014 Elsevier Interactive Patient Education 2016 [employer].  Constipation, Adult Constipation is when a person:  Poops (has a bowel movement) less than 3 times a week.  Has a hard time pooping.  Has poop that is dry, hard, or bigger than normal. HOME CARE   Eat foods with a lot of fiber in them. This includes fruits, vegetables, beans, and whole grains such as brown rice.  Avoid fatty foods and foods with a lot of sugar. This includes french fries, hamburgers, cookies, candy, and soda.  If you are not getting enough fiber from food, take products with added fiber in them (supplements).  Drink enough fluid to keep your pee (urine) clear or pale yellow.  Exercise on a regular basis, or as told by your doctor.  Go to the restroom when you feel like you need to poop. Do not hold it.  Only take medicine as told by your doctor. Do not take medicines that help you poop (laxatives) without talking to your doctor first. GET HELP RIGHT AWAY IF:   You have bright red blood in your poop (stool).  Your constipation lasts more than 4 days or gets worse.  You have belly (abdominal) or butt (rectal) pain.  You have thin poop (as thin as a pencil).  You lose weight, and it cannot be explained. MAKE SURE YOU:   Understand these instructions.  Will watch your condition.  Will get help right away if you are not doing well or get worse.   This information is not intended to replace advice given to you by your health care provider. Make sure you discuss any questions you have with your health care provider.   Document Released: 05/16/2008 Document Revised: 12/19/2014 Document Reviewed: 09/09/2013 Elsevier Interactive Patient Education [employer].

## 2016-01-07 ENCOUNTER — Emergency Department (HOSPITAL_COMMUNITY)
Admission: EM | Admit: 2016-01-07 | Discharge: 2016-01-08 | Disposition: A | Discharge status: Home / Self Care | Payer: Medicaid Other | Attending: Emergency Medicine | Admitting: Emergency Medicine

## 2016-01-07 ENCOUNTER — Emergency Department (HOSPITAL_COMMUNITY): Payer: Medicaid Other

## 2016-01-07 ENCOUNTER — Encounter (HOSPITAL_COMMUNITY): Payer: Self-pay | Admitting: Emergency Medicine

## 2016-01-07 DIAGNOSIS — Z3202 Encounter for pregnancy test, result negative: Secondary | ICD-10-CM | POA: Insufficient documentation

## 2016-01-07 DIAGNOSIS — R011 Cardiac murmur, unspecified: Secondary | ICD-10-CM | POA: Diagnosis not present

## 2016-01-07 DIAGNOSIS — Z86018 Personal history of other benign neoplasm: Secondary | ICD-10-CM | POA: Insufficient documentation

## 2016-01-07 DIAGNOSIS — Z9851 Tubal ligation status: Secondary | ICD-10-CM | POA: Diagnosis not present

## 2016-01-07 DIAGNOSIS — K5900 Constipation, unspecified: Secondary | ICD-10-CM | POA: Diagnosis not present

## 2016-01-07 DIAGNOSIS — Z791 Long term (current) use of non-steroidal anti-inflammatories (NSAID): Secondary | ICD-10-CM | POA: Insufficient documentation

## 2016-01-07 DIAGNOSIS — R0602 Shortness of breath: Secondary | ICD-10-CM | POA: Insufficient documentation

## 2016-01-07 DIAGNOSIS — Z862 Personal history of diseases of the blood and blood-forming organs and certain disorders involving the immune mechanism: Secondary | ICD-10-CM | POA: Diagnosis not present

## 2016-01-07 DIAGNOSIS — F17210 Nicotine dependence, cigarettes, uncomplicated: Secondary | ICD-10-CM | POA: Diagnosis not present

## 2016-01-07 DIAGNOSIS — N390 Urinary tract infection, site not specified: Secondary | ICD-10-CM | POA: Insufficient documentation

## 2016-01-07 DIAGNOSIS — R1084 Generalized abdominal pain: Secondary | ICD-10-CM | POA: Diagnosis present

## 2016-01-07 LAB — CBC
HCT: 34.4 % — ABNORMAL LOW (ref 36.0–46.0)
Hemoglobin: 11 g/dL — ABNORMAL LOW (ref 12.0–15.0)
MCH: 28.9 pg (ref 26.0–34.0)
MCHC: 32 g/dL (ref 30.0–36.0)
MCV: 90.3 fL (ref 78.0–100.0)
Platelets: 235 10*3/uL (ref 150–400)
RBC: 3.81 MIL/uL — ABNORMAL LOW (ref 3.87–5.11)
RDW: 15.4 % (ref 11.5–15.5)
WBC: 15.4 10*3/uL — ABNORMAL HIGH (ref 4.0–10.5)

## 2016-01-07 LAB — BASIC METABOLIC PANEL
Anion gap: 10 (ref 5–15)
BUN: 6 mg/dL (ref 6–20)
CO2: 24 mmol/L (ref 22–32)
Calcium: 9 mg/dL (ref 8.9–10.3)
Chloride: 105 mmol/L (ref 101–111)
Creatinine, Ser: 0.63 mg/dL (ref 0.44–1.00)
GFR calc Af Amer: >60 mL/min (ref >60)
GFR calc non Af Amer: >60 mL/min (ref >60)
Glucose, Bld: 106 mg/dL — ABNORMAL HIGH (ref 65–99)
Potassium: 3.6 mmol/L (ref 3.5–5.1)
Sodium: 139 mmol/L (ref 135–145)

## 2016-01-07 MED ORDER — IOHEXOL 300 MG/ML  SOLN
25.0000 mL | Freq: Once | INTRAMUSCULAR | Status: AC | PRN
  Administered 2016-01-07: 25 mL via ORAL

## 2016-01-07 MED ORDER — SODIUM CHLORIDE 0.9 % IV BOLUS (SEPSIS)
1000.0000 mL | Freq: Once | INTRAVENOUS | Status: AC
  Administered 2016-01-07: 1000 mL via INTRAVENOUS

## 2016-01-07 MED ORDER — DIAZEPAM 5 MG/ML IJ SOLN
5.0000 mg | Freq: Once | INTRAMUSCULAR | Status: AC
  Administered 2016-01-08: 5 mg via INTRAVENOUS
  Filled 2016-01-07: qty 2

## 2016-01-07 MED ORDER — KETOROLAC TROMETHAMINE 15 MG/ML IJ SOLN
15.0000 mg | Freq: Once | INTRAMUSCULAR | Status: AC
  Administered 2016-01-08: 15 mg via INTRAVENOUS
  Filled 2016-01-07: qty 1

## 2016-01-07 MED ORDER — HYDROMORPHONE HCL 1 MG/ML IJ SOLN
0.5000 mg | Freq: Once | INTRAMUSCULAR | Status: AC
  Administered 2016-01-08: 0.5 mg via INTRAVENOUS
  Filled 2016-01-07: qty 1

## 2016-01-07 NOTE — ED Provider Notes (Addendum)
Medical screening examination/treatment/procedure(s) were performed by non-physician practitioner and as supervising physician I was immediately available for consultation/collaboration.   EKG Interpretation None     39yF with abdominal pain. Recently seen for same. Presumed to be 2/2 constipation. Has not had BM in a week. Enemas, suppositories and miralax without significant result. Actually had MR yesterday, but only pelvis. On exam she is distended and diffusely tender. Seems very uncomfortable. Have some concerns that this is more than simply constipation. Rectal exam by tyler Devona Konig w/o impaction. Will treat pain. Labs from yesterday reviewed. Also needs UA. Plan CT a/p to further evaluate. Disposition pending the results of this.     Virgel Manifold, MD 01/07/16 [id number]

## 2016-01-07 NOTE — ED Notes (Signed)
Pt was taking Miralax with no relief. Pt took miralax today 01/07/16, at 4 pm with no relief. Pt states more stomach pressure and SOB and pain of 10 out of 10.

## 2016-01-07 NOTE — ED Notes (Signed)
Pt states that [facility] told her that she was constipated. Was seen there yesterday. Told come here if it got worse. Pt states has not had a BM in about 1 week. [facility]

## 2016-01-07 NOTE — ED Provider Notes (Signed)
CSN: [id number]     Arrival date & time 01/07/16  1956 History   First MD Initiated Contact with Patient 01/07/16 2239     Chief Complaint  Patient presents with  . Constipation   (Consider location/radiation/quality/duration/timing/severity/associated sxs/prior Treatment) HPI 40 y.o. female [phone] with a hx of four c-sections, uterine fibroids, presents to the Emergency Department today complaining of diffuse abdominal pain with no BM since Friday. She was seen at [facility] and manually disimpacted with the addition of an enema with little success. An MR was done of her pelvis for further evaluation. She was DCed with Miralax and suppositories. Notes that the pain has not been any better. She reports the pain as a 10/10 with pressure along the lower abdomen. She has additional SOB due to the pressure. No N/V. No fevers. Reports no dysuria, no vaginal bleeding, no discharge.      Past Medical History  Diagnosis Date  . Heart murmur   . Fibroids   . Anemia    Past Surgical History  Procedure Laterality Date  . Cesarean section    . Tubal ligation    . Ankle surgery     History reviewed. No pertinent family history. Social History  Substance Use Topics  . Smoking status: Current Every Day Smoker -- 0.50 packs/day    Types: Cigarettes  . Smokeless tobacco: Never Used  . Alcohol Use: No   OB History    Gravida Para Term Preterm AB TAB SAB Ectopic Multiple Living   4 4  4      4      Review of Systems ROS reviewed and all are negative for acute change except as noted in the HPI.  Allergies  Review of patient's allergies indicates no known allergies.  Home Medications   Prior to Admission medications   Medication Sig Start Date End Date Taking? Authorizing Provider  ibuprofen (ADVIL,MOTRIN) 800 MG tablet Take 1 tablet (800 mg total) by mouth 3 (three) times daily. 12/29/15  Yes Courteney Lyn Mackuen, MD  oxyCODONE-acetaminophen (PERCOCET/ROXICET) 5-325 MG tablet Take 1  tablet by mouth every 6 (six) hours as needed for severe pain. 12/29/15  Yes Courteney Lyn Mackuen, MD  polyethylene glycol powder (GLYCOLAX/MIRALAX) powder 1 capful daily as needed for constipation 01/06/16  Yes Luvenia Redden, PA-C  Vitamin D, Ergocalciferol, (DRISDOL) 50000 UNITS CAPS capsule Take 50,000 Units by mouth every 7 (seven) days.   Yes Historical Provider, MD  acetaminophen (TYLENOL) 500 MG tablet Take 1,000 mg by mouth every 6 (six) hours as needed for moderate pain or headache.    Historical Provider, MD  ALPRAZolam Duanne Moron) 0.25 MG tablet Take 0.25 mg by mouth 3 (three) times daily as needed for anxiety.    Historical Provider, MD  bisacodyl (DULCOLAX) 10 MG suppository Place 1 suppository (10 mg total) rectally as needed for moderate constipation. 01/06/16   Luvenia Redden, PA-C  ibuprofen (ADVIL,MOTRIN) 800 MG tablet Take 1 tablet (800 mg total) by mouth 3 (three) times daily. Patient not taking: Reported on 01/07/2016 08/25/15   West Pugh, NP   BP 126/69 mmHg  Pulse 87  Temp(Src) 98 F (36.7 C) (Oral)  Resp 20  SpO2 100%  LMP 12/28/2015 (Approximate)   Physical Exam  Constitutional: She is oriented to person, place, and time. She appears well-developed and well-nourished.  HENT:  Head: Normocephalic and atraumatic.  Eyes: EOM are normal.  Neck: Normal range of motion.  Cardiovascular: Normal rate, regular rhythm and normal heart  sounds.   Pulmonary/Chest: Effort normal and breath sounds normal.  Abdominal: Soft. Normal appearance and bowel sounds are normal. She exhibits distension. She exhibits no mass. There is tenderness in the right lower quadrant, suprapubic area and left lower quadrant. There is no rebound, no guarding, no CVA tenderness and negative Murphy's sign.  Genitourinary: Rectum normal. Rectal exam shows no external hemorrhoid, no internal hemorrhoid, no fissure, no mass and no tenderness.  No fecal impaction noted on digital rectal exam. Chaperone was  present.   Musculoskeletal: Normal range of motion.  Neurological: She is alert and oriented to person, place, and time.  Skin: Skin is warm and dry.  Psychiatric: She has a normal mood and affect. Her behavior is normal. Thought content normal.  Nursing note and vitals reviewed.  ED Course  Procedures (including critical care time) Labs Review Labs Reviewed  CBC - Abnormal; Notable for the following:    WBC 15.4 (*)    RBC 3.81 (*)    Hemoglobin 11.0 (*)    HCT 34.4 (*)    All other components within normal limits  BASIC METABOLIC PANEL - Abnormal; Notable for the following:    Glucose, Bld 106 (*)    All other components within normal limits  URINALYSIS, ROUTINE W REFLEX MICROSCOPIC (NOT AT [facility])  PREGNANCY, URINE   Imaging Review Mr Pelvis W Wo Contrast  01/06/2016  CLINICAL DATA:  Uterine fibroids, pre  uterine artery embolization. EXAM: MRI PELVIS WITHOUT AND WITH CONTRAST TECHNIQUE: Multiplanar multisequence MR imaging of the pelvis was performed both before and after administration of intravenous contrast. CONTRAST:  53mL MULTIHANCE GADOBENATE DIMEGLUMINE 529 MG/ML IV SOLN COMPARISON:  12/29/2015 and 07/14/2015 pelvic ultrasound. CT of 04/30/2015. FINDINGS: Portions of exam are mildly motion degraded. Normal pelvic bowel loops. No pelvic adenopathy. Normal urinary bladder. Both ovaries are normal, including on image 17/ series 5 and image 19/ series 5. Small volume cul-de-sac fluid, slightly greater than typically seen physiologically. Cesarean section defect within the anterior uterine body on image 15/series 6. The endometrium is normal. Given the above described motion, no uterine fibroids are identified. No correlate for the ultrasound abnormality of last August within the uterine fundus. IMPRESSION: 1. No evidence of a uterine fibroids. 2. Motion degraded exam. 3. Small volume pelvic fluid, greater than typically seen physiologically. This is of indeterminate etiology.  Electronically Signed   By: Abigail Miyamoto M.D.   On: 01/06/2016 09:27   I have personally reviewed and evaluated these images and lab results as part of my medical decision-making.   EKG Interpretation None     MDM  I have reviewed relevant laboratory values. I have reviewed relevant imaging studies.  I have reviewed the relevant previous healthcare records.  I obtained HPI from historian. Patient discussed with supervising physician  ED Course: CT ABD/Pelvis  Assessment: 61y F hx of four c-sections, uterine fibroids presents with abdominal pain for the past week. She was recently seen at [facility]U for similar complaint yesterday. She was given an enema there with minimal relief. MR of pelvis was done which showed fibroid, but no impaction. DCed with Miralax and suppositories. On exam, pt is diffusely tender with obvious distention. Treated pain with analgesia. Manual disimpaction produced no stool as non was felt in the rectal vault. Labs showed Leukocytosis (15.4). Ordered CT ABD/Pelvis for further evaluation. Concern that distention may be caused by more than constipation. Disposition pending results.      Disposition/Plan:  Sign out To Charlann Lange, PA-C  Supervising Physician Virgel Manifold, MD   Final diagnoses:  None     Shary Decamp, PA-C 01/08/16 0102  Virgel Manifold, MD 01/08/16 1539

## 2016-01-08 ENCOUNTER — Encounter (HOSPITAL_COMMUNITY): Payer: Self-pay

## 2016-01-08 LAB — URINALYSIS, ROUTINE W REFLEX MICROSCOPIC
Bilirubin Urine: NEGATIVE
Glucose, UA: NEGATIVE mg/dL
Ketones, ur: NEGATIVE mg/dL
Leukocytes, UA: NEGATIVE
Nitrite: POSITIVE — AB
Protein, ur: NEGATIVE mg/dL
Specific Gravity, Urine: 1.011 (ref 1.005–1.030)
pH: 7 (ref 5.0–8.0)

## 2016-01-08 LAB — URINE MICROSCOPIC-ADD ON

## 2016-01-08 LAB — PREGNANCY, URINE: Preg Test, Ur: NEGATIVE

## 2016-01-08 MED ORDER — LEVOFLOXACIN 750 MG PO TABS: 750.0000 mg | ORAL_TABLET | Freq: Every day | ORAL | Status: DC

## 2016-01-08 MED ORDER — IOHEXOL 300 MG/ML  SOLN
100.0000 mL | Freq: Once | INTRAMUSCULAR | Status: AC | PRN
  Administered 2016-01-08: 100 mL via INTRAVENOUS

## 2016-01-08 MED ORDER — HYDROMORPHONE HCL 1 MG/ML IJ SOLN
1.0000 mg | Freq: Once | INTRAMUSCULAR | Status: AC
  Administered 2016-01-08: 1 mg via INTRAVENOUS
  Filled 2016-01-08: qty 1

## 2016-01-08 MED ORDER — FLEET ENEMA 7-19 GM/118ML RE ENEM
1.0000 | ENEMA | Freq: Once | RECTAL | Status: DC
  Filled 2016-01-08: qty 1

## 2016-01-08 NOTE — Discharge Instructions (Signed)
CONSTIPATION: FLEETS ENEMA AS DIRECTED MIRALAX - 3 TIMES DAILY UNTIL BOWELS MOVE - 3 DAY CONSECUTIVE MAXIMUM MAGNESIUM CITRATE AS DIRECTED HIGH FIBER DIET WITH POSSIBLE SUPPLEMENTATION (BENAFIBER, METAMUCIL)  High-Fiber Diet Fiber, also called dietary fiber, is a type of carbohydrate found in fruits, vegetables, whole grains, and beans. A high-fiber diet can have many health benefits. Your health care provider may recommend a high-fiber diet to help:  Prevent constipation. Fiber can make your bowel movements more regular.  Lower your cholesterol.  Relieve hemorrhoids, uncomplicated diverticulosis, or irritable bowel syndrome.  Prevent overeating as part of a weight-loss plan.  Prevent heart disease, type 2 diabetes, and certain cancers. WHAT IS MY PLAN? The recommended daily intake of fiber includes:  38 grams for men under age 64.  79 grams for men over age 59.  85 grams for women under age 29.  55 grams for women over age 26. You can get the recommended daily intake of dietary fiber by eating a variety of fruits, vegetables, grains, and beans. Your health care provider may also recommend a fiber supplement if it is not possible to get enough fiber through your diet. WHAT DO I NEED TO KNOW ABOUT A HIGH-FIBER DIET?  Fiber supplements have not been widely studied for their effectiveness, so it is better to get fiber through food sources.  Always check the fiber content on thenutrition facts label of any prepackaged food. Look for foods that contain at least 5 grams of fiber per serving.  Ask your dietitian if you have questions about specific foods that are related to your condition, especially if those foods are not listed in the following section.  Increase your daily fiber consumption gradually. Increasing your intake of dietary fiber too quickly may cause bloating, cramping, or gas.  Drink plenty of water. Water helps you to digest fiber. WHAT FOODS CAN I  EAT? Grains Whole-grain breads. Multigrain cereal. Oats and oatmeal. Brown rice. Barley. Bulgur wheat. Paia. Bran muffins. Popcorn. Rye wafer crackers. Vegetables Sweet potatoes. Spinach. Kale. Artichokes. Cabbage. Broccoli. Green peas. Carrots. Squash. Fruits Berries. Pears. Apples. Oranges. Avocados. Prunes and raisins. Dried figs. Meats and Other Protein Sources Navy, kidney, pinto, and soy beans. Split peas. Lentils. Nuts and seeds. Dairy Fiber-fortified yogurt. Beverages Fiber-fortified soy milk. Fiber-fortified orange juice. Other Fiber bars. The items listed above may not be a complete list of recommended foods or beverages. Contact your dietitian for more options. WHAT FOODS ARE NOT RECOMMENDED? Grains White bread. Pasta made with refined flour. White rice. Vegetables Fried potatoes. Canned vegetables. Well-cooked vegetables.  Fruits Fruit juice. Cooked, strained fruit. Meats and Other Protein Sources Fatty cuts of meat. Fried Sales executive or fried fish. Dairy Milk. Yogurt. Cream cheese. Sour cream. Beverages Soft drinks. Other Cakes and pastries. Butter and oils. The items listed above may not be a complete list of foods and beverages to avoid. Contact your dietitian for more information. WHAT ARE SOME TIPS FOR INCLUDING HIGH-FIBER FOODS IN MY DIET?  Eat a wide variety of high-fiber foods.  Make sure that half of all grains consumed each day are whole grains.  Replace breads and cereals made from refined flour or white flour with whole-grain breads and cereals.  Replace white rice with brown rice, bulgur wheat, or millet.  Start the day with a breakfast that is high in fiber, such as a cereal that contains at least 5 grams of fiber per serving.  Use beans in place of meat in soups, salads, or pasta.  Eat high-fiber  snacks, such as berries, raw vegetables, nuts, or popcorn.   This information is not intended to replace advice given to you by your health care  provider. Make sure you discuss any questions you have with your health care provider.   Document Released: 11/28/2005 Document Revised: 12/19/2014 Document Reviewed: 05/13/2014 Elsevier Interactive Patient Education 2016 [employer]. Constipation, Adult Constipation is when a person has fewer than three bowel movements a week, has difficulty having a bowel movement, or has stools that are dry, hard, or larger than normal. As people grow older, constipation is more common. A low-fiber diet, not taking in enough fluids, and taking certain medicines may make constipation worse.  CAUSES   Certain medicines, such as antidepressants, pain medicine, iron supplements, antacids, and water pills.   Certain diseases, such as diabetes, irritable bowel syndrome (IBS), thyroid disease, or depression.   Not drinking enough water.   Not eating enough fiber-rich foods.   Stress or travel.   Lack of physical activity or exercise.   Ignoring the urge to have a bowel movement.   Using laxatives too much.  SIGNS AND SYMPTOMS   Having fewer than three bowel movements a week.   Straining to have a bowel movement.   Having stools that are hard, dry, or larger than normal.   Feeling full or bloated.   Pain in the lower abdomen.   Not feeling relief after having a bowel movement.  DIAGNOSIS  Your health care provider will take a medical history and perform a physical exam. Further testing may be done for severe constipation. Some tests may include:  A barium enema X-ray to examine your rectum, colon, and, sometimes, your small intestine.   A sigmoidoscopy to examine your lower colon.   A colonoscopy to examine your entire colon. TREATMENT  Treatment will depend on the severity of your constipation and what is causing it. Some dietary treatments include drinking more fluids and eating more fiber-rich foods. Lifestyle treatments may include regular exercise. If these diet and  lifestyle recommendations do not help, your health care provider may recommend taking over-the-counter laxative medicines to help you have bowel movements. Prescription medicines may be prescribed if over-the-counter medicines do not work.  HOME CARE INSTRUCTIONS   Eat foods that have a lot of fiber, such as fruits, vegetables, whole grains, and beans.  Limit foods high in fat and processed sugars, such as french fries, hamburgers, cookies, candies, and soda.   A fiber supplement may be added to your diet if you cannot get enough fiber from foods.   Drink enough fluids to keep your urine clear or pale yellow.   Exercise regularly or as directed by your health care provider.   Go to the restroom when you have the urge to go. Do not hold it.   Only take over-the-counter or prescription medicines as directed by your health care provider. Do not take other medicines for constipation without talking to your health care provider first.  [place] IF:   You have bright red blood in your stool.   Your constipation lasts for more than 4 days or gets worse.   You have abdominal or rectal pain.   You have thin, pencil-like stools.   You have unexplained weight loss. MAKE SURE YOU:   Understand these instructions.  Will watch your condition.  Will get help right away if you are not doing well or get worse.   This information is not intended to replace advice given  to you by your health care provider. Make sure you discuss any questions you have with your health care provider.   Document Released: 08/26/2004 Document Revised: 12/19/2014 Document Reviewed: 09/09/2013 Elsevier Interactive Patient Education 2016 Elsevier Inc. Urinary Tract Infection Urinary tract infections (UTIs) can develop anywhere along your urinary tract. Your urinary tract is your body's drainage system for removing wastes and extra water. Your urinary tract includes two kidneys, two  ureters, a bladder, and a urethra. Your kidneys are a pair of bean-shaped organs. Each kidney is about the size of your fist. They are located below your ribs, one on each side of your spine. CAUSES Infections are caused by microbes, which are microscopic organisms, including fungi, viruses, and bacteria. These organisms are so small that they can only be seen through a microscope. Bacteria are the microbes that most commonly cause UTIs. SYMPTOMS  Symptoms of UTIs may vary by age and gender of the patient and by the location of the infection. Symptoms in young women typically include a frequent and intense urge to urinate and a painful, burning feeling in the bladder or urethra during urination. Older women and men are more likely to be tired, shaky, and weak and have muscle aches and abdominal pain. A fever may mean the infection is in your kidneys. Other symptoms of a kidney infection include pain in your back or sides below the ribs, nausea, and vomiting. DIAGNOSIS To diagnose a UTI, your caregiver will ask you about your symptoms. Your caregiver will also ask you to provide a urine sample. The urine sample will be tested for bacteria and white blood cells. White blood cells are made by your body to help fight infection. TREATMENT  Typically, UTIs can be treated with medication. Because most UTIs are caused by a bacterial infection, they usually can be treated with the use of antibiotics. The choice of antibiotic and length of treatment depend on your symptoms and the type of bacteria causing your infection. HOME CARE INSTRUCTIONS  If you were prescribed antibiotics, take them exactly as your caregiver instructs you. Finish the medication even if you feel better after you have only taken some of the medication.  Drink enough water and fluids to keep your urine clear or pale yellow.  Avoid caffeine, tea, and carbonated beverages. They tend to irritate your bladder.  Empty your bladder often. Avoid  holding urine for long periods of time.  Empty your bladder before and after sexual intercourse.  After a bowel movement, women should cleanse from front to back. Use each tissue only once. SEEK MEDICAL CARE IF:   You have back pain.  You develop a fever.  Your symptoms do not begin to resolve within 3 days. SEEK IMMEDIATE MEDICAL CARE IF:   You have severe back pain or lower abdominal pain.  You develop chills.  You have nausea or vomiting.  You have continued burning or discomfort with urination. MAKE SURE YOU:   Understand these instructions.  Will watch your condition.  Will get help right away if you are not doing well or get worse.   This information is not intended to replace advice given to you by your health care provider. Make sure you discuss any questions you have with your health care provider.   Document Released: 09/07/2005 Document Revised: 08/19/2015 Document Reviewed: 01/06/2012 Elsevier Interactive Patient Education [employer].

## 2016-01-08 NOTE — ED Provider Notes (Signed)
H/o multiple c-section in the past, c/o abd pain No fever, N, V,  [facility] yest for same abdominal complaint MR pelvis - neg D/ch with miralax diffusely tender lower abd - distention CT abd/pel pending Pain managed currently.  Plan: constipation - enema   Patient's CT shows constipation without obstruction. ?mild colitis. ?PNA in lung bases, however, the patient does not have fever, or cough. She does have a UTI on lab studies which likely is contributing to her pelvic pain. Will treat with Levaquin which provides both pulmonary and urinary coverage.   Recommended treatment of constipation with high dose Miralax, Mag Citrate, enemas and dietary considerations. She will follow up with her doctor if symptoms persist.   Charlann Lange, PA-C [id number]  Delora Fuel, MD [id number]

## 2016-01-09 ENCOUNTER — Encounter (HOSPITAL_COMMUNITY): Payer: Self-pay | Admitting: Family Medicine

## 2016-01-09 ENCOUNTER — Emergency Department (HOSPITAL_COMMUNITY): Payer: Medicaid Other

## 2016-01-09 ENCOUNTER — Inpatient Hospital Stay (HOSPITAL_COMMUNITY)
Admission: EM | Admit: 2016-01-09 | Discharge: 2016-01-12 | DRG: 389 | Disposition: A | Discharge status: Home / Self Care | Payer: Medicaid Other | Attending: Family Medicine | Admitting: Family Medicine

## 2016-01-09 DIAGNOSIS — D259 Leiomyoma of uterus, unspecified: Secondary | ICD-10-CM | POA: Diagnosis present

## 2016-01-09 DIAGNOSIS — R112 Nausea with vomiting, unspecified: Secondary | ICD-10-CM | POA: Diagnosis not present

## 2016-01-09 DIAGNOSIS — K567 Ileus, unspecified: Principal | ICD-10-CM | POA: Diagnosis present

## 2016-01-09 DIAGNOSIS — K5901 Slow transit constipation: Secondary | ICD-10-CM | POA: Diagnosis not present

## 2016-01-09 DIAGNOSIS — G894 Chronic pain syndrome: Secondary | ICD-10-CM | POA: Diagnosis present

## 2016-01-09 DIAGNOSIS — R109 Unspecified abdominal pain: Secondary | ICD-10-CM | POA: Diagnosis not present

## 2016-01-09 DIAGNOSIS — R1084 Generalized abdominal pain: Secondary | ICD-10-CM

## 2016-01-09 DIAGNOSIS — Z720 Tobacco use: Secondary | ICD-10-CM

## 2016-01-09 DIAGNOSIS — N390 Urinary tract infection, site not specified: Secondary | ICD-10-CM | POA: Diagnosis present

## 2016-01-09 DIAGNOSIS — F419 Anxiety disorder, unspecified: Secondary | ICD-10-CM | POA: Diagnosis present

## 2016-01-09 DIAGNOSIS — E876 Hypokalemia: Secondary | ICD-10-CM | POA: Diagnosis present

## 2016-01-09 DIAGNOSIS — N920 Excessive and frequent menstruation with regular cycle: Secondary | ICD-10-CM | POA: Diagnosis present

## 2016-01-09 DIAGNOSIS — Y92009 Unspecified place in unspecified non-institutional (private) residence as the place of occurrence of the external cause: Secondary | ICD-10-CM

## 2016-01-09 DIAGNOSIS — K5903 Drug induced constipation: Secondary | ICD-10-CM | POA: Insufficient documentation

## 2016-01-09 DIAGNOSIS — F17210 Nicotine dependence, cigarettes, uncomplicated: Secondary | ICD-10-CM | POA: Diagnosis present

## 2016-01-09 DIAGNOSIS — T402X5A Adverse effect of other opioids, initial encounter: Secondary | ICD-10-CM | POA: Insufficient documentation

## 2016-01-09 DIAGNOSIS — K5900 Constipation, unspecified: Secondary | ICD-10-CM

## 2016-01-09 DIAGNOSIS — F1110 Opioid abuse, uncomplicated: Secondary | ICD-10-CM | POA: Diagnosis present

## 2016-01-09 DIAGNOSIS — D509 Iron deficiency anemia, unspecified: Secondary | ICD-10-CM | POA: Diagnosis present

## 2016-01-09 LAB — COMPREHENSIVE METABOLIC PANEL
ALT: 27 U/L (ref 14–54)
AST: 23 U/L (ref 15–41)
Albumin: 3.6 g/dL (ref 3.5–5.0)
Alkaline Phosphatase: 77 U/L (ref 38–126)
Anion gap: 16 — ABNORMAL HIGH (ref 5–15)
BUN: 7 mg/dL (ref 6–20)
CO2: 23 mmol/L (ref 22–32)
Calcium: 9.5 mg/dL (ref 8.9–10.3)
Chloride: 99 mmol/L — ABNORMAL LOW (ref 101–111)
Creatinine, Ser: 0.69 mg/dL (ref 0.44–1.00)
GFR calc Af Amer: >60 mL/min (ref >60)
GFR calc non Af Amer: >60 mL/min (ref >60)
Glucose, Bld: 102 mg/dL — ABNORMAL HIGH (ref 65–99)
Potassium: 3.6 mmol/L (ref 3.5–5.1)
Sodium: 138 mmol/L (ref 135–145)
Total Bilirubin: 1.1 mg/dL (ref 0.3–1.2)
Total Protein: 8.2 g/dL — ABNORMAL HIGH (ref 6.5–8.1)

## 2016-01-09 LAB — URINALYSIS, ROUTINE W REFLEX MICROSCOPIC
Glucose, UA: NEGATIVE mg/dL
Ketones, ur: 40 mg/dL — AB
Nitrite: NEGATIVE
Protein, ur: 100 mg/dL — AB
Specific Gravity, Urine: 1.035 — ABNORMAL HIGH (ref 1.005–1.030)
pH: 6 (ref 5.0–8.0)

## 2016-01-09 LAB — URINE MICROSCOPIC-ADD ON

## 2016-01-09 LAB — CBC
HCT: 35.3 % — ABNORMAL LOW (ref 36.0–46.0)
Hemoglobin: 11.8 g/dL — ABNORMAL LOW (ref 12.0–15.0)
MCH: 28.8 pg (ref 26.0–34.0)
MCHC: 33.4 g/dL (ref 30.0–36.0)
MCV: 86.1 fL (ref 78.0–100.0)
Platelets: 228 10*3/uL (ref 150–400)
RBC: 4.1 MIL/uL (ref 3.87–5.11)
RDW: 15 % (ref 11.5–15.5)
WBC: 10 10*3/uL (ref 4.0–10.5)

## 2016-01-09 LAB — LIPASE, BLOOD: Lipase: 15 U/L (ref 11–51)

## 2016-01-09 LAB — I-STAT BETA HCG BLOOD, ED (MC, WL, AP ONLY): I-stat hCG, quantitative: <5 m[IU]/mL (ref <5)

## 2016-01-09 MED ORDER — SODIUM CHLORIDE 0.9 % IV BOLUS (SEPSIS)
1000.0000 mL | Freq: Once | INTRAVENOUS | Status: AC
Start: 2016-01-09 — End: 2016-01-09
  Administered 2016-01-09: 1000 mL via INTRAVENOUS

## 2016-01-09 MED ORDER — HYDROMORPHONE HCL 1 MG/ML IJ SOLN
1.0000 mg | Freq: Once | INTRAMUSCULAR | Status: AC
  Administered 2016-01-09: 1 mg via INTRAVENOUS
  Filled 2016-01-09: qty 1

## 2016-01-09 MED ORDER — ONDANSETRON HCL 4 MG PO TABS: 4.0000 mg | ORAL_TABLET | Freq: Four times a day (QID) | ORAL | Status: DC | PRN

## 2016-01-09 MED ORDER — MORPHINE SULFATE (PF) 4 MG/ML IV SOLN
4.0000 mg | Freq: Once | INTRAVENOUS | Status: AC
  Administered 2016-01-09: 4 mg via INTRAVENOUS
  Filled 2016-01-09: qty 1

## 2016-01-09 MED ORDER — PANTOPRAZOLE SODIUM 40 MG IV SOLR
40.0000 mg | Freq: Every day | INTRAVENOUS | Status: DC
  Administered 2016-01-09 – 2016-01-10 (×2): 40 mg via INTRAVENOUS
  Filled 2016-01-09 (×2): qty 40

## 2016-01-09 MED ORDER — LORAZEPAM 2 MG/ML IJ SOLN
0.5000 mg | Freq: Two times a day (BID) | INTRAMUSCULAR | Status: DC | PRN
  Administered 2016-01-11: 0.5 mg via INTRAVENOUS
  Filled 2016-01-09: qty 1

## 2016-01-09 MED ORDER — SODIUM CHLORIDE 0.9 % IV SOLN
INTRAVENOUS | Status: DC
  Administered 2016-01-10: 20:00:00 via INTRAVENOUS

## 2016-01-09 MED ORDER — ENOXAPARIN SODIUM 40 MG/0.4ML ~~LOC~~ SOLN
40.0000 mg | SUBCUTANEOUS | Status: DC
  Administered 2016-01-09 – 2016-01-11 (×3): 40 mg via SUBCUTANEOUS
  Filled 2016-01-09 (×3): qty 0.4

## 2016-01-09 MED ORDER — LORAZEPAM 2 MG/ML IJ SOLN
0.5000 mg | Freq: Every evening | INTRAMUSCULAR | Status: DC | PRN
  Administered 2016-01-09 – 2016-01-11 (×3): 0.5 mg via INTRAVENOUS
  Filled 2016-01-09 (×2): qty 1

## 2016-01-09 MED ORDER — BISACODYL 5 MG PO TBEC
10.0000 mg | DELAYED_RELEASE_TABLET | Freq: Once | ORAL | Status: AC
  Administered 2016-01-09: 10 mg via ORAL
  Filled 2016-01-09: qty 2

## 2016-01-09 MED ORDER — ONDANSETRON HCL 4 MG/2ML IJ SOLN
4.0000 mg | Freq: Once | INTRAMUSCULAR | Status: AC
  Administered 2016-01-09: 4 mg via INTRAVENOUS
  Filled 2016-01-09: qty 2

## 2016-01-09 MED ORDER — ACETAMINOPHEN 650 MG RE SUPP: 650.0000 mg | Freq: Four times a day (QID) | RECTAL | Status: DC | PRN

## 2016-01-09 MED ORDER — ONDANSETRON HCL 4 MG/2ML IJ SOLN
4.0000 mg | Freq: Four times a day (QID) | INTRAMUSCULAR | Status: DC | PRN
  Administered 2016-01-10 – 2016-01-11 (×2): 4 mg via INTRAVENOUS
  Filled 2016-01-09 (×2): qty 2

## 2016-01-09 MED ORDER — TRAMADOL HCL 50 MG PO TABS
50.0000 mg | ORAL_TABLET | Freq: Four times a day (QID) | ORAL | Status: DC | PRN
  Administered 2016-01-09 – 2016-01-12 (×4): 50 mg via ORAL
  Filled 2016-01-09 (×4): qty 1

## 2016-01-09 MED ORDER — SODIUM CHLORIDE 0.9 % IV SOLN
INTRAVENOUS | Status: DC
  Administered 2016-01-09: 17:00:00 via INTRAVENOUS

## 2016-01-09 MED ORDER — TRAMADOL HCL 50 MG PO TABS: 50.0000 mg | ORAL_TABLET | Freq: Four times a day (QID) | ORAL | Status: DC | PRN

## 2016-01-09 MED ORDER — SORBITOL 70 % SOLN
960.0000 mL | TOPICAL_OIL | Freq: Once | ORAL | Status: AC
  Administered 2016-01-09: 960 mL via RECTAL
  Filled 2016-01-09: qty 240

## 2016-01-09 MED ORDER — KETOROLAC TROMETHAMINE 30 MG/ML IJ SOLN
30.0000 mg | Freq: Four times a day (QID) | INTRAMUSCULAR | Status: AC
  Administered 2016-01-09 – 2016-01-10 (×4): 30 mg via INTRAVENOUS
  Filled 2016-01-09 (×4): qty 1

## 2016-01-09 MED ORDER — ACETAMINOPHEN 325 MG PO TABS: 650.0000 mg | ORAL_TABLET | Freq: Four times a day (QID) | ORAL | Status: DC | PRN

## 2016-01-09 NOTE — H&P (Signed)
[facility] History and Physical  [name] [id number] DOB: 02-04-1976 DOA: 01/09/2016  Referring physician: Emergency Department PCP: [facility]   CHIEF COMPLAINT:     Abdominal pain, nausea and vomiting              HPI: [name] is a 40 y.o. female who has been seen in the emergency department a couple of times in the last few days because of abdominal pain . She was seen at [facility] 01/06/16 with abdominal pain and constipation refractory to enemas. Patient was given Dulcolax suppositories and MiraLAX but says these were ineffective. She's also tried magnesium/rate and hot tea . Patient was seen in the ED 01/07/16 for persistent abdominal pain. No impaction on rectal exam. CT scan obtained, no obstruction. There was a question of mild colitis, possible pneumonia in the lung bases. Urinalysis was suspicious for UTI and patient was given Levaquin.   Patient back in the ED today with persistent generalized abdominal pain, distention, nausea and vomiting. She still has not had any meaningful bowel movements and has vomited several times. Emesis nonbloody.. Patient denies history of bowel obstruction. She denies chronic constipation.. No recent medication changes which could have precipitated constipation. No rectal bleeding.  ED COURSE:   Patient given pain meds, antiemetics and a liter of fluid She is afebrile, vital signs stable        Labs:   Chloride 99, normal renal function, glucose 102, anion gap 16, bicarbonate 23, wbc 10, hemoglobin 11 point 8  Urinalysis:    Hazy, trace leukocytes, negative nitrites. Pregnancy test negative yesterday                        Medications  0.9 %  sodium chloride infusion (not administered)  sodium chloride 0.9 % bolus 1,000 mL (0 mLs Intravenous Stopped 01/09/16 1517)  morphine 4 MG/ML injection 4 mg (4 mg Intravenous Given 01/09/16 1320)  ondansetron (ZOFRAN) injection 4 mg (4 mg Intravenous Given 01/09/16 1320)    morphine 4 MG/ML injection 4 mg (4 mg Intravenous Given 01/09/16 1421)  HYDROmorphone (DILAUDID) injection 1 mg (1 mg Intravenous Given 01/09/16 1517)    Review of Systems  Constitutional: Negative.   HENT: Negative.   Eyes: Negative.   Respiratory: Negative.   Cardiovascular: Negative.   Gastrointestinal: Positive for nausea, vomiting and constipation.  Genitourinary: Negative.   Musculoskeletal: Negative.   Skin: Negative.   Endo/Heme/Allergies: Negative.   Psychiatric/Behavioral: Negative.     Past Medical History  Diagnosis Date  . Heart murmur   . Fibroids   . Anemia    Past Surgical History  Procedure Laterality Date  . Cesarean section    . Tubal ligation    . Ankle surgery      SOCIAL HISTORY:  reports that she has been smoking Cigarettes.  She has been smoking about 0.50 packs per day. She has never used smokeless tobacco. She reports that she does not drink alcohol or use illicit drugs. Lives: At home with her children    Assistive devices:   None needed for ambulation.   No Known Allergies  Family medical history:  Maternal grandmother diabetes, maternal aunt hypertension  Prior to Admission medications   Medication Sig Start Date End Date Taking? Authorizing Provider  acetaminophen (TYLENOL) 500 MG tablet Take 1,000 mg by mouth every 6 (six) hours as needed for moderate pain or headache.   Yes Historical Provider, MD  ALPRAZolam Duanne Moron)  0.25 MG tablet Take 0.25 mg by mouth 3 (three) times daily as needed for anxiety.   Yes Historical Provider, MD  bisacodyl (DULCOLAX) 10 MG suppository Place 1 suppository (10 mg total) rectally as needed for moderate constipation. 01/06/16  Yes Luvenia Redden, PA-C  ibuprofen (ADVIL,MOTRIN) 800 MG tablet Take 1 tablet (800 mg total) by mouth 3 (three) times daily. 12/29/15  Yes Courteney Lyn Mackuen, MD  levofloxacin (LEVAQUIN) 750 MG tablet Take 1 tablet (750 mg total) by mouth daily. 01/08/16  Yes Charlann Lange, PA-C   oxyCODONE-acetaminophen (PERCOCET/ROXICET) 5-325 MG tablet Take 1 tablet by mouth every 6 (six) hours as needed for severe pain. 12/29/15  Yes Courteney Lyn Mackuen, MD  polyethylene glycol powder (GLYCOLAX/MIRALAX) powder 1 capful daily as needed for constipation 01/06/16  Yes Luvenia Redden, PA-C  Vitamin D, Ergocalciferol, (DRISDOL) 50000 UNITS CAPS capsule Take 50,000 Units by mouth every 7 (seven) days.   Yes Historical Provider, MD  ibuprofen (ADVIL,MOTRIN) 800 MG tablet Take 1 tablet (800 mg total) by mouth 3 (three) times daily. Patient not taking: Reported on 01/07/2016 08/25/15   West Pugh, NP   PHYSICAL EXAM: Filed Vitals:   01/09/16 1500 01/09/16 1530 01/09/16 1545 01/09/16 1600  BP: 125/75 129/80 115/61 108/63  Pulse: 75 87 89 71  Temp:      TempSrc:      Resp:      SpO2: 99% 98% 99% 96%    Wt Readings from Last 3 Encounters:  01/06/16 74.844 kg (165 lb)  12/24/15 74.844 kg (165 lb)  10/06/14 79.833 kg (176 lb)    General:  Black female psych drowsy. Appears calm and comfortable Eyes: PER, normal lids, irises & conjunctiva ENT: grossly normal hearing, lips & tongue Neck: no LAD, no masses Cardiovascular: RRR, no murmurs. No LE edema.  Respiratory: Respirations even and unlabored. Normal respiratory effort. Lungs CTA bilaterally, no wheezes / rales .   Abdomen: soft, protuberant , mild diffuse tenderness,, normal active bowel sounds. No obvious masses.  Skin: no rash seen on limited exam. Tattoos Musculoskeletal: grossly normal tone BUE/BLE Psychiatric: grossly normal mood. Flat affect Neurologic: grossly non-focal.         LABS ON ADMISSION:    Basic Metabolic Panel:  Recent Labs Lab 01/06/16 0310 01/07/16 2320 01/09/16 1356  NA 138 139 138  K 3.5 3.6 3.6  CL 102 105 99*  CO2 26 24 23   GLUCOSE 129* 106* 102*  BUN 5* 6 7  CREATININE 0.78 0.63 0.69  CALCIUM 9.1 9.0 9.5   Liver Function Tests:  Recent Labs Lab 01/06/16 0310 01/09/16 1356  AST  20 23  ALT 18 27  ALKPHOS 72 77  BILITOT 0.6 1.1  PROT 8.8* 8.2*  ALBUMIN 4.7 3.6    Recent Labs Lab 01/09/16 1356  LIPASE 15    CBC:  Recent Labs Lab 01/06/16 0310 01/07/16 2320 01/09/16 1356  WBC 12.9* 15.4* 10.0  NEUTROABS 11.6*  --   --   HGB 10.8* 11.0* 11.8*  HCT 33.6* 34.4* 35.3*  MCV 89.8 90.3 86.1  PLT 244 235 228    CREATININE: 0.69 (01/09/16 1356) Estimated creatinine clearance - 93.5 mL/min  Radiological Exams on Admission: Ct Abdomen Pelvis W Contrast  01/08/2016  CLINICAL DATA:  Abdominal pain and distention. No bowel movement for 1 week. EXAM: CT ABDOMEN AND PELVIS WITH CONTRAST TECHNIQUE: Multidetector CT imaging of the abdomen and pelvis was performed using the standard protocol following bolus administration of intravenous  contrast. CONTRAST:  37mL OMNIPAQUE IOHEXOL 300 MG/ML SOLN, 158mL OMNIPAQUE IOHEXOL 300 MG/ML SOLN COMPARISON:  MRI pelvis 01/06/2016. CT abdomen and pelvis 04/30/2015. FINDINGS: Airspace infiltrative changes in both lung bases, greater on the right. This may represent atelectasis or multifocal pneumonia. The liver, spleen, gallbladder, pancreas, adrenal glands, abdominal aorta, inferior vena cava, and retroperitoneal lymph nodes are unremarkable. Cyst in the anterior right kidney. No hydronephrosis in either kidney. Stomach is decompressed. Small bowel are decompressed. Contrast material flows through to the colon without evidence of bowel obstruction. No free air or free fluid in the abdomen. Colon is diffusely stool-filled. There does appear to be colonic wall thickening involving the transverse colon and hepatic flexure although this could just be due to adherent stool. Colitis is not excluded. Pelvis: Uterus and ovaries are not enlarged. Endometrial stripe is somewhat thickened and fluid-filled. Calcification in the anterior uterus possibly representing small fibroid. No evidence of diverticulitis. No free or loculated pelvic fluid  collections. No pelvic mass or lymphadenopathy. Appendix is not identified. IMPRESSION: Diffusely stool-filled colon suggesting constipation. No evidence of bowel obstruction. Possible thickening of the wall of the right colon and hepatic flexure. This may be due to adherent stool but colitis is not excluded. Airspace infiltration in the lung bases suggest pneumonia or less likely atelectasis. Endometrial cavity is prominent and fluid-filled. Possibly physiologic. Electronically Signed   By: Lucienne Capers M.D.   On: 01/08/2016 02:20   Dg Abd Acute W/chest  01/09/2016  CLINICAL DATA:  40 year old female with right abdominal pain, nausea and vomiting for 2 days. EXAM: DG ABDOMEN ACUTE W/ 1V CHEST COMPARISON:  01/08/2016 CT and prior exams. FINDINGS: The cardiomediastinal silhouette is unremarkable. There is no evidence of airspace disease, pleural effusion or pneumothorax. Now identified are mildly distended loops of small bowel with air-fluid levels. There is no evidence of pneumoperitoneum. Small amount of gas in the colon is noted. IMPRESSION: New mildly distended small bowel loops with air-fluid levels. This may represent an ileus or possibly developing small bowel obstruction. No evidence of pneumoperitoneum. No evidence of acute cardiopulmonary disease. Electronically Signed   By: Margarette Canada M.D.   On: 01/09/2016 13:56    ASSESSMENT / PLAN   Generalized abdominal pain, nausea and vomiting . Plain films show new mild distention of the small bowel loops with air-fluid levels. Patient has had several abdominal surgeries increasing risk for adhesions . CT scan from yesterday suggest possible thickening of the wall of the right colon and hepatic flexure as well as a diffusely stool-filled colon.  -Patient is failing outpatient treatment -Admit to medical bed- observation -Smog enema this evening -supportive care with prn anti-emetic, IVF -Limit narcotics given constipation. Will try Ultram with sips  of water.  -Her abdominal exam is not overly impressive but x-rays do suggest some small bowel air-fluid levels. If patient has persistent nausea and vomiting will consider nasogastric tube placement.  -follow up K[facility] in am.  -She may need outpatient colonoscopy at some point to evaluate CT scan finding.   UTI. Suspicious u/a in ED two days ago at which time she also had at WBC count of 15.4 .Culture wasn't sent, patient given Levaquin. Today's u/a shows significant improvement. -change to IV antibiotics given nausea and vomiting. For uncomplicated UTI 3 days of antibiotics should suffice. On 3rd day but doubt absorbing given vomiting. Treat with IV Levaquin with 2 more doses.   Leukocytosis. Improving. CTscan in ED yesterday suggested airspace disease in lung bases, PNA vr  atelectasis. No coughing. She is a smoker.  May need follow up pulmonary imaging outpatient.   Anxiety, on prn Xanax at home.  -trial of prn low dose ativan.  Tobacco abuse -tobacco cessation per nursing.   CONSULTANTS:    None  Code Status: Full code DVT Prophylaxis: Lovenox Family Communication:  Patient alert, oriented and understands plan of care.  Disposition Plan: Discharge to home in 24-48 hours   Time spent: 60 minutes Tye Savoy  NP [facility] Pager [phone]

## 2016-01-09 NOTE — ED Notes (Signed)
Pt presents from home via [facility] with c/o RLQ abdominal pain x1 day with distention, nausea, and emesis x5.  Pt was seen recently for constipation and UTI and was given Miralax and an antibiotic.  Reports this has not helped her abdominal pain and the only BM she has had was a small amnt of green liquid stool x1 today.

## 2016-01-09 NOTE — ED Notes (Addendum)
Pt returned from [place]. Phlebotomy at bedside for lab draw. Pain has not improved with Morphine - remains 10/10

## 2016-01-09 NOTE — ED Notes (Signed)
Pt difficult IV start. Unable to obtain Labwork from IV start.

## 2016-01-09 NOTE — ED Notes (Signed)
Admitting at bedside 

## 2016-01-09 NOTE — ED Provider Notes (Signed)
CSN: [id number]     Arrival date & time 01/09/16  1220 History   First MD Initiated Contact with Patient 01/09/16 1243     Chief Complaint  Patient presents with  . Abdominal Pain     (Consider location/radiation/quality/duration/timing/severity/associated sxs/prior Treatment) HPI Point of lower abdominal pain onset 5 days ago. Pain is constant. She was seen here 2 days ago. Had CT scan of abdomen pelvis consistent with constipation and possible colitis. She's been treated at home with enema, prescribed Levaquin for her tract infection . She presents today is abdominal pain has worsened. She's vomited at least 5 times today. He is to complain of nausea. No other associated symptoms. Nothing makes pain better or worse Past Medical History  Diagnosis Date  . Heart murmur   . Fibroids   . Anemia    Past Surgical History  Procedure Laterality Date  . Cesarean section    . Tubal ligation    . Ankle surgery     No family history on file. Social History  Substance Use Topics  . Smoking status: Current Every Day Smoker -- 0.50 packs/day    Types: Cigarettes  . Smokeless tobacco: Never Used  . Alcohol Use: No   OB History    Gravida Para Term Preterm AB TAB SAB Ectopic Multiple Living   4 4  4      4      Review of Systems    Allergies  Review of patient's allergies indicates no known allergies.  Home Medications   Prior to Admission medications   Medication Sig Start Date End Date Taking? Authorizing Provider  acetaminophen (TYLENOL) 500 MG tablet Take 1,000 mg by mouth every 6 (six) hours as needed for moderate pain or headache.    Historical Provider, MD  ALPRAZolam Duanne Moron) 0.25 MG tablet Take 0.25 mg by mouth 3 (three) times daily as needed for anxiety.    Historical Provider, MD  bisacodyl (DULCOLAX) 10 MG suppository Place 1 suppository (10 mg total) rectally as needed for moderate constipation. 01/06/16   Luvenia Redden, PA-C  ibuprofen (ADVIL,MOTRIN) 800 MG tablet Take  1 tablet (800 mg total) by mouth 3 (three) times daily. Patient not taking: Reported on 01/07/2016 08/25/15   West Pugh, NP  ibuprofen (ADVIL,MOTRIN) 800 MG tablet Take 1 tablet (800 mg total) by mouth 3 (three) times daily. 12/29/15   Courteney Lyn Mackuen, MD  levofloxacin (LEVAQUIN) 750 MG tablet Take 1 tablet (750 mg total) by mouth daily. 01/08/16   Charlann Lange, PA-C  oxyCODONE-acetaminophen (PERCOCET/ROXICET) 5-325 MG tablet Take 1 tablet by mouth every 6 (six) hours as needed for severe pain. 12/29/15   Courteney Lyn Mackuen, MD  polyethylene glycol powder (GLYCOLAX/MIRALAX) powder 1 capful daily as needed for constipation 01/06/16   Luvenia Redden, PA-C  Vitamin D, Ergocalciferol, (DRISDOL) 50000 UNITS CAPS capsule Take 50,000 Units by mouth every 7 (seven) days.    Historical Provider, MD   Pulse 78  Temp(Src) 98.6 F (37 C) (Oral)  Resp 20  SpO2 100%  LMP 12/28/2015 (Approximate) Physical Exam  Constitutional: She appears well-developed and well-nourished. She appears distressed.  Appears uncomfortable  HENT:  Head: Normocephalic and atraumatic.  Eyes: Conjunctivae are normal. Pupils are equal, round, and reactive to light.  Neck: Neck supple. No tracheal deviation present. No thyromegaly present.  Cardiovascular: Normal rate and regular rhythm.   No murmur heard. Pulmonary/Chest: Effort normal and breath sounds normal. She exhibits no tenderness.  Abdominal: Soft. Bowel sounds  are normal. She exhibits no distension and no mass. There is tenderness. There is no rebound and no guarding.  Mild diffuse tenderness  Musculoskeletal: Normal range of motion. She exhibits no edema or tenderness.  Neurological: She is alert. Coordination normal.  Skin: Skin is warm and dry. No rash noted.  Psychiatric: She has a normal mood and affect.  Nursing note and vitals reviewed.   ED Course  Procedures (including critical care time) Labs Review Labs Reviewed  LIPASE, BLOOD   COMPREHENSIVE METABOLIC PANEL  CBC  URINALYSIS, ROUTINE W REFLEX MICROSCOPIC (NOT AT [facility])  I-STAT BETA HCG BLOOD, ED ([facility], [facility], [facility] ONLY)    Imaging Review Ct Abdomen Pelvis W Contrast  01/08/2016  CLINICAL DATA:  Abdominal pain and distention. No bowel movement for 1 week. EXAM: CT ABDOMEN AND PELVIS WITH CONTRAST TECHNIQUE: Multidetector CT imaging of the abdomen and pelvis was performed using the standard protocol following bolus administration of intravenous contrast. CONTRAST:  45mL OMNIPAQUE IOHEXOL 300 MG/ML SOLN, 147mL OMNIPAQUE IOHEXOL 300 MG/ML SOLN COMPARISON:  MRI pelvis 01/06/2016. CT abdomen and pelvis 04/30/2015. FINDINGS: Airspace infiltrative changes in both lung bases, greater on the right. This may represent atelectasis or multifocal pneumonia. The liver, spleen, gallbladder, pancreas, adrenal glands, abdominal aorta, inferior vena cava, and retroperitoneal lymph nodes are unremarkable. Cyst in the anterior right kidney. No hydronephrosis in either kidney. Stomach is decompressed. Small bowel are decompressed. Contrast material flows through to the colon without evidence of bowel obstruction. No free air or free fluid in the abdomen. Colon is diffusely stool-filled. There does appear to be colonic wall thickening involving the transverse colon and hepatic flexure although this could just be due to adherent stool. Colitis is not excluded. Pelvis: Uterus and ovaries are not enlarged. Endometrial stripe is somewhat thickened and fluid-filled. Calcification in the anterior uterus possibly representing small fibroid. No evidence of diverticulitis. No free or loculated pelvic fluid collections. No pelvic mass or lymphadenopathy. Appendix is not identified. IMPRESSION: Diffusely stool-filled colon suggesting constipation. No evidence of bowel obstruction. Possible thickening of the wall of the right colon and hepatic flexure. This may be due to adherent stool but colitis is not excluded. Airspace  infiltration in the lung bases suggest pneumonia or less likely atelectasis. Endometrial cavity is prominent and fluid-filled. Possibly physiologic. Electronically Signed   By: Lucienne Capers M.D.   On: 01/08/2016 02:20   I have personally reviewed and evaluated these images and lab results as part of my medical decision-making.   EKG Interpretation None     X-rays viewed by me.  315 p.m. nausea is controlled after treatment with intravenous antiemetics. Pain is not well controlled after treatment with intravenous opioids. Intravenous hydromorphone ordered. Patient remains hemodynamically stable. Results for orders placed or performed during the hospital encounter of 01/09/16  Lipase, blood  Result Value Ref Range   Lipase 15 11 - 51 U/L  Comprehensive metabolic panel  Result Value Ref Range   Sodium 138 135 - 145 mmol/L   Potassium 3.6 3.5 - 5.1 mmol/L   Chloride 99 (L) 101 - 111 mmol/L   CO2 23 22 - 32 mmol/L   Glucose, Bld 102 (H) 65 - 99 mg/dL   BUN 7 6 - 20 mg/dL   Creatinine, Ser 0.69 0.44 - 1.00 mg/dL   Calcium 9.5 8.9 - 10.3 mg/dL   Total Protein 8.2 (H) 6.5 - 8.1 g/dL   Albumin 3.6 3.5 - 5.0 g/dL   AST 23 15 - 41 U/L  ALT 27 14 - 54 U/L   Alkaline Phosphatase 77 38 - 126 U/L   Total Bilirubin 1.1 0.3 - 1.2 mg/dL   GFR calc non Af Amer >60 >60 mL/min   GFR calc Af Amer >60 >60 mL/min   Anion gap 16 (H) 5 - 15  CBC  Result Value Ref Range   WBC 10.0 4.0 - 10.5 K/uL   RBC 4.10 3.87 - 5.11 MIL/uL   Hemoglobin 11.8 (L) 12.0 - 15.0 g/dL   HCT 35.3 (L) 36.0 - 46.0 %   MCV 86.1 78.0 - 100.0 fL   MCH 28.8 26.0 - 34.0 pg   MCHC 33.4 30.0 - 36.0 g/dL   RDW 15.0 11.5 - 15.5 %   Platelets 228 150 - 400 K/uL  Urinalysis, Routine w reflex microscopic (not at [facility])  Result Value Ref Range   Color, Urine AMBER (A) YELLOW   APPearance HAZY (A) CLEAR   Specific Gravity, Urine 1.035 (H) 1.005 - 1.030   pH 6.0 5.0 - 8.0   Glucose, UA NEGATIVE NEGATIVE mg/dL   Hgb urine  dipstick MODERATE (A) NEGATIVE   Bilirubin Urine SMALL (A) NEGATIVE   Ketones, ur 40 (A) NEGATIVE mg/dL   Protein, ur 100 (A) NEGATIVE mg/dL   Nitrite NEGATIVE NEGATIVE   Leukocytes, UA TRACE (A) NEGATIVE  Urine microscopic-add on  Result Value Ref Range   Squamous Epithelial / LPF 0-5 (A) NONE SEEN   WBC, UA 0-5 0 - 5 WBC/hpf   RBC / HPF 6-30 0 - 5 RBC/hpf   Bacteria, UA FEW (A) NONE SEEN   Urine-Other MUCOUS PRESENT   I-Stat beta hCG blood, ED ([facility], [facility], [facility] only)  Result Value Ref Range   I-stat hCG, quantitative <5.0 <5 mIU/mL   Comment 3           Mr Pelvis W Wo Contrast  01/06/2016  CLINICAL DATA:  Uterine fibroids, pre  uterine artery embolization. EXAM: MRI PELVIS WITHOUT AND WITH CONTRAST TECHNIQUE: Multiplanar multisequence MR imaging of the pelvis was performed both before and after administration of intravenous contrast. CONTRAST:  39mL MULTIHANCE GADOBENATE DIMEGLUMINE 529 MG/ML IV SOLN COMPARISON:  12/29/2015 and 07/14/2015 pelvic ultrasound. CT of 04/30/2015. FINDINGS: Portions of exam are mildly motion degraded. Normal pelvic bowel loops. No pelvic adenopathy. Normal urinary bladder. Both ovaries are normal, including on image 17/ series 5 and image 19/ series 5. Small volume cul-de-sac fluid, slightly greater than typically seen physiologically. Cesarean section defect within the anterior uterine body on image 15/series 6. The endometrium is normal. Given the above described motion, no uterine fibroids are identified. No correlate for the ultrasound abnormality of last August within the uterine fundus. IMPRESSION: 1. No evidence of a uterine fibroids. 2. Motion degraded exam. 3. Small volume pelvic fluid, greater than typically seen physiologically. This is of indeterminate etiology. Electronically Signed   By: Abigail Miyamoto M.D.   On: 01/06/2016 09:27   US Transvaginal Non-ob  12/29/2015  CLINICAL DATA:  Chronic right lower quadrant abdominal pain. EXAM: TRANSABDOMINAL AND  TRANSVAGINAL ULTRASOUND OF PELVIS TECHNIQUE: Both transabdominal and transvaginal ultrasound examinations of the pelvis were performed. Transabdominal technique was performed for global imaging of the pelvis including uterus, ovaries, adnexal regions, and pelvic cul-de-sac. It was necessary to proceed with endovaginal exam following the transabdominal exam to visualize the endometrium and ovaries. COMPARISON:  July 14, 2015. FINDINGS: Uterus Measurements: 13.7.8 x 5.7 cm. Focal echogenicity is noted anteriorly and uterine fundus which may represent calcification  related to small uterine fibroid. Endometrium Thickness: 19 mm which is abnormally thick. No focal abnormality seen. Right ovary Measurements: 2.7 x 1.8 x 1.6 cm. Follicular cysts are noted. Left ovary Not visualized due to overlying bowel gas. Other findings No abnormal free fluid. IMPRESSION: Endometrial thickness is considered abnormal. Consider follow-up by [place] in 6-8 weeks, during the week immediately following menses (exam timing is critical). Left ovary is not visualized due to overlying bowel gas. Right ovary appears normal. Electronically Signed   By: Marijo Conception, M.D.   On: 12/29/2015 14:32   US Pelvis Complete  12/29/2015  CLINICAL DATA:  Chronic right lower quadrant abdominal pain. EXAM: TRANSABDOMINAL AND TRANSVAGINAL ULTRASOUND OF PELVIS TECHNIQUE: Both transabdominal and transvaginal ultrasound examinations of the pelvis were performed. Transabdominal technique was performed for global imaging of the pelvis including uterus, ovaries, adnexal regions, and pelvic cul-de-sac. It was necessary to proceed with endovaginal exam following the transabdominal exam to visualize the endometrium and ovaries. COMPARISON:  July 14, 2015. FINDINGS: Uterus Measurements: 13.7.8 x 5.7 cm. Focal echogenicity is noted anteriorly and uterine fundus which may represent calcification related to small uterine fibroid. Endometrium Thickness: 19 mm which is  abnormally thick. No focal abnormality seen. Right ovary Measurements: 2.7 x 1.8 x 1.6 cm. Follicular cysts are noted. Left ovary Not visualized due to overlying bowel gas. Other findings No abnormal free fluid. IMPRESSION: Endometrial thickness is considered abnormal. Consider follow-up by [place] in 6-8 weeks, during the week immediately following menses (exam timing is critical). Left ovary is not visualized due to overlying bowel gas. Right ovary appears normal. Electronically Signed   By: Marijo Conception, M.D.   On: 12/29/2015 14:32   Ct Abdomen Pelvis W Contrast  01/08/2016  CLINICAL DATA:  Abdominal pain and distention. No bowel movement for 1 week. EXAM: CT ABDOMEN AND PELVIS WITH CONTRAST TECHNIQUE: Multidetector CT imaging of the abdomen and pelvis was performed using the standard protocol following bolus administration of intravenous contrast. CONTRAST:  25mL OMNIPAQUE IOHEXOL 300 MG/ML SOLN, 168mL OMNIPAQUE IOHEXOL 300 MG/ML SOLN COMPARISON:  MRI pelvis 01/06/2016. CT abdomen and pelvis 04/30/2015. FINDINGS: Airspace infiltrative changes in both lung bases, greater on the right. This may represent atelectasis or multifocal pneumonia. The liver, spleen, gallbladder, pancreas, adrenal glands, abdominal aorta, inferior vena cava, and retroperitoneal lymph nodes are unremarkable. Cyst in the anterior right kidney. No hydronephrosis in either kidney. Stomach is decompressed. Small bowel are decompressed. Contrast material flows through to the colon without evidence of bowel obstruction. No free air or free fluid in the abdomen. Colon is diffusely stool-filled. There does appear to be colonic wall thickening involving the transverse colon and hepatic flexure although this could just be due to adherent stool. Colitis is not excluded. Pelvis: Uterus and ovaries are not enlarged. Endometrial stripe is somewhat thickened and fluid-filled. Calcification in the anterior uterus possibly representing small fibroid. No  evidence of diverticulitis. No free or loculated pelvic fluid collections. No pelvic mass or lymphadenopathy. Appendix is not identified. IMPRESSION: Diffusely stool-filled colon suggesting constipation. No evidence of bowel obstruction. Possible thickening of the wall of the right colon and hepatic flexure. This may be due to adherent stool but colitis is not excluded. Airspace infiltration in the lung bases suggest pneumonia or less likely atelectasis. Endometrial cavity is prominent and fluid-filled. Possibly physiologic. Electronically Signed   By: Lucienne Capers M.D.   On: 01/08/2016 02:20   Dg Abd Acute W/chest  01/09/2016  CLINICAL DATA:  40 year old  female with right abdominal pain, nausea and vomiting for 2 days. EXAM: DG ABDOMEN ACUTE W/ 1V CHEST COMPARISON:  01/08/2016 CT and prior exams. FINDINGS: The cardiomediastinal silhouette is unremarkable. There is no evidence of airspace disease, pleural effusion or pneumothorax. Now identified are mildly distended loops of small bowel with air-fluid levels. There is no evidence of pneumoperitoneum. Small amount of gas in the colon is noted. IMPRESSION: New mildly distended small bowel loops with air-fluid levels. This may represent an ileus or possibly developing small bowel obstruction. No evidence of pneumoperitoneum. No evidence of acute cardiopulmonary disease. Electronically Signed   By: Margarette Canada M.D.   On: 01/09/2016 13:56    MDM  Clinically patient may have early bowel obstruction however we will not CT patient as she had CT scan Final diagnoses:  None   Less than 48 hours ago. Rather we will observe patient clinically. Treat symptomatically. I discussed the case with hospitalist service who will see patient in the hospital plan 23 hour observation to medical surgical floor, nothing by mouth intravenous hydration Diagnosis #1 abdominal pain #2 nausea and vomiting      Orlie Dakin, MD 01/09/16 1546

## 2016-01-10 ENCOUNTER — Observation Stay (HOSPITAL_COMMUNITY): Payer: Medicaid Other

## 2016-01-10 DIAGNOSIS — K5901 Slow transit constipation: Secondary | ICD-10-CM | POA: Diagnosis not present

## 2016-01-10 DIAGNOSIS — R109 Unspecified abdominal pain: Secondary | ICD-10-CM | POA: Diagnosis present

## 2016-01-10 DIAGNOSIS — K5903 Drug induced constipation: Secondary | ICD-10-CM | POA: Diagnosis present

## 2016-01-10 DIAGNOSIS — N390 Urinary tract infection, site not specified: Secondary | ICD-10-CM | POA: Diagnosis present

## 2016-01-10 DIAGNOSIS — N920 Excessive and frequent menstruation with regular cycle: Secondary | ICD-10-CM | POA: Diagnosis present

## 2016-01-10 DIAGNOSIS — Z720 Tobacco use: Secondary | ICD-10-CM | POA: Diagnosis not present

## 2016-01-10 DIAGNOSIS — D509 Iron deficiency anemia, unspecified: Secondary | ICD-10-CM | POA: Diagnosis present

## 2016-01-10 DIAGNOSIS — Y92009 Unspecified place in unspecified non-institutional (private) residence as the place of occurrence of the external cause: Secondary | ICD-10-CM | POA: Diagnosis not present

## 2016-01-10 DIAGNOSIS — R1084 Generalized abdominal pain: Secondary | ICD-10-CM

## 2016-01-10 DIAGNOSIS — T402X5A Adverse effect of other opioids, initial encounter: Secondary | ICD-10-CM | POA: Diagnosis present

## 2016-01-10 DIAGNOSIS — F17210 Nicotine dependence, cigarettes, uncomplicated: Secondary | ICD-10-CM | POA: Diagnosis present

## 2016-01-10 DIAGNOSIS — E876 Hypokalemia: Secondary | ICD-10-CM | POA: Diagnosis present

## 2016-01-10 DIAGNOSIS — F1110 Opioid abuse, uncomplicated: Secondary | ICD-10-CM | POA: Diagnosis present

## 2016-01-10 DIAGNOSIS — K5900 Constipation, unspecified: Secondary | ICD-10-CM | POA: Diagnosis not present

## 2016-01-10 DIAGNOSIS — K567 Ileus, unspecified: Secondary | ICD-10-CM | POA: Diagnosis not present

## 2016-01-10 DIAGNOSIS — F419 Anxiety disorder, unspecified: Secondary | ICD-10-CM | POA: Diagnosis present

## 2016-01-10 DIAGNOSIS — D259 Leiomyoma of uterus, unspecified: Secondary | ICD-10-CM | POA: Diagnosis present

## 2016-01-10 DIAGNOSIS — G894 Chronic pain syndrome: Secondary | ICD-10-CM | POA: Diagnosis present

## 2016-01-10 LAB — URINE CULTURE
Culture: NO GROWTH
Special Requests: NORMAL

## 2016-01-10 LAB — TSH: TSH: 0.672 u[IU]/mL (ref 0.350–4.500)

## 2016-01-10 LAB — CBC
HCT: 29.8 % — ABNORMAL LOW (ref 36.0–46.0)
Hemoglobin: 9.6 g/dL — ABNORMAL LOW (ref 12.0–15.0)
MCH: 28.2 pg (ref 26.0–34.0)
MCHC: 32.2 g/dL (ref 30.0–36.0)
MCV: 87.4 fL (ref 78.0–100.0)
Platelets: 219 10*3/uL (ref 150–400)
RBC: 3.41 MIL/uL — ABNORMAL LOW (ref 3.87–5.11)
RDW: 15.4 % (ref 11.5–15.5)
WBC: 6.7 10*3/uL (ref 4.0–10.5)

## 2016-01-10 LAB — BASIC METABOLIC PANEL
Anion gap: 11 (ref 5–15)
BUN: 11 mg/dL (ref 6–20)
CO2: 27 mmol/L (ref 22–32)
Calcium: 8.7 mg/dL — ABNORMAL LOW (ref 8.9–10.3)
Chloride: 104 mmol/L (ref 101–111)
Creatinine, Ser: 0.74 mg/dL (ref 0.44–1.00)
GFR calc Af Amer: >60 mL/min (ref >60)
GFR calc non Af Amer: >60 mL/min (ref >60)
Glucose, Bld: 80 mg/dL (ref 65–99)
Potassium: 3.3 mmol/L — ABNORMAL LOW (ref 3.5–5.1)
Sodium: 142 mmol/L (ref 135–145)

## 2016-01-10 MED ORDER — POTASSIUM CHLORIDE CRYS ER 20 MEQ PO TBCR
40.0000 meq | EXTENDED_RELEASE_TABLET | Freq: Once | ORAL | Status: AC
  Administered 2016-01-10: 40 meq via ORAL
  Filled 2016-01-10: qty 2

## 2016-01-10 MED ORDER — NICOTINE 21 MG/24HR TD PT24
21.0000 mg | MEDICATED_PATCH | Freq: Every day | TRANSDERMAL | Status: DC
  Administered 2016-01-10 – 2016-01-12 (×3): 21 mg via TRANSDERMAL
  Filled 2016-01-10 (×3): qty 1

## 2016-01-10 MED ORDER — SACCHAROMYCES BOULARDII 250 MG PO CAPS
250.0000 mg | ORAL_CAPSULE | Freq: Two times a day (BID) | ORAL | Status: DC
  Administered 2016-01-10 – 2016-01-12 (×5): 250 mg via ORAL
  Filled 2016-01-10 (×5): qty 1

## 2016-01-10 MED ORDER — HYDROMORPHONE HCL 1 MG/ML IJ SOLN
0.5000 mg | INTRAMUSCULAR | Status: DC | PRN
  Administered 2016-01-10 – 2016-01-12 (×8): 0.5 mg via INTRAVENOUS
  Filled 2016-01-10 (×9): qty 1

## 2016-01-10 MED ORDER — MILK AND MOLASSES ENEMA
1.0000 | Freq: Once | RECTAL | Status: AC
  Administered 2016-01-10: 250 mL via RECTAL
  Filled 2016-01-10: qty 250

## 2016-01-10 MED ORDER — POLYETHYLENE GLYCOL 3350 17 G PO PACK
17.0000 g | PACK | Freq: Two times a day (BID) | ORAL | Status: DC
  Administered 2016-01-10 (×2): 17 g via ORAL
  Filled 2016-01-10 (×2): qty 1

## 2016-01-10 MED ORDER — PANTOPRAZOLE SODIUM 40 MG PO TBEC
40.0000 mg | DELAYED_RELEASE_TABLET | Freq: Every day | ORAL | Status: DC
  Administered 2016-01-11 – 2016-01-12 (×2): 40 mg via ORAL
  Filled 2016-01-10 (×2): qty 1

## 2016-01-10 MED ORDER — CEFTRIAXONE SODIUM 1 G IJ SOLR
1.0000 g | INTRAMUSCULAR | Status: DC
  Administered 2016-01-10 – 2016-01-11 (×2): 1 g via INTRAVENOUS
  Filled 2016-01-10 (×3): qty 10

## 2016-01-10 NOTE — Progress Notes (Signed)
[facility] PROGRESS NOTE  [name] [id number] DOB: 10-13-1976 DOA: 01/09/2016 PCP: [facility]  Assessment/Plan: 1. Constipation/ileus- patient came with abdominal pain and constipation. KUB showed fluid-filled loops of small bowel with air-fluid level, repeat x-ray abdomen this morning shows improvement in ileus. Will start patient on MiraLAX 17 g by mouth twice a day. Will give milk and  molasses enema. 2. UTI- patient was diagnosed with UTI on 01/08/2016 at that time UA showed positive nitrite with WBCs. Patient was started on Levaquin at home she took 1 tablet of Levaquin before coming to the hospital. Urine culture has been obtained during this admission. We'll start the patient on ceftriaxone 1 g IV daily. 3. Anxiety- continue when necessary Ativan 4. Hypokalemia- replace potassium and check BMP in a.m. 5. Chronic pain syndrome- patient takes oxycodone when necessary home. Will start Dilaudid 0.5 mg every 4 hours when necessary the hospital.  Code Status: Full code Family Communication: *No family present at bedside Disposition Plan: Pending improvement in constipation   Consultants:  None  Procedures:  None  Antibiotics:  Ceftriaxone  HPI/Subjective: 40 yo F with previous episodes of abdominal pain and per notes has been on oxycodone, however, she denies chronic narcotic use. She denies previous history of constipation but over the last several weeks has had severe constipation not relieved by enemas, suppositories, magnesium citrate, miralax resulting in several recent ER visits. She has never had any thyroid problems or blood in her stools. She has been otherwise healthy. She had refractory emesis in the ER, VSS. Labs not particularly suggestive of dehydration with normal BUN and creatinine. CT from ER visit yesterday demonstrated constipation without obstruction and KUB today demonstrated fluid filled loops of small bowel with air-fluid level.    Patient was given SMOG enema last night, with small bowel movement.  Objective: Filed Vitals:   01/09/16 2105 01/10/16 0456  BP: 126/72 125/66  Pulse: 82 74  Temp: 98.5 F (36.9 C) 98.1 F (36.7 C)  Resp: 19 19    Intake/Output Summary (Last 24 hours) at 01/10/16 1142 Last data filed at 01/10/16 0700  Gross per 24 hour  Intake 973.75 ml  Output      0 ml  Net 973.75 ml   Filed Weights   01/09/16 1657  Weight: 74.39 kg (164 lb)    Exam:   General:  Appears in no acute distress  Cardiovascular: S1-S2 regular  Respiratory: Clear to auscultation bilaterally  Abdomen: Soft, mild distention, nontender to palpation  Musculoskeletal: No cyanosis/clubbing/edema of the lower extremities   Data Reviewed: Basic Metabolic Panel:  Recent Labs Lab 01/06/16 0310 01/07/16 2320 01/09/16 1356 01/10/16 0727  NA 138 139 138 142  K 3.5 3.6 3.6 3.3*  CL 102 105 99* 104  CO2 26 24 23 27   GLUCOSE 129* 106* 102* 80  BUN 5* 6 7 11   CREATININE 0.78 0.63 0.69 0.74  CALCIUM 9.1 9.0 9.5 8.7*   Liver Function Tests:  Recent Labs Lab 01/06/16 0310 01/09/16 1356  AST 20 23  ALT 18 27  ALKPHOS 72 77  BILITOT 0.6 1.1  PROT 8.8* 8.2*  ALBUMIN 4.7 3.6    Recent Labs Lab 01/09/16 1356  LIPASE 15   CBC:  Recent Labs Lab 01/06/16 0310 01/07/16 2320 01/09/16 1356 01/10/16 0727  WBC 12.9* 15.4* 10.0 6.7  NEUTROABS 11.6*  --   --   --   HGB 10.8* 11.0* 11.8* 9.6*  HCT 33.6* 34.4* 35.3* 29.8*  MCV  89.8 90.3 86.1 87.4  PLT 244 235 228 219     Studies: Dg Abd 1 View  01/10/2016  CLINICAL DATA:  Abdominal pain, constipation for 4 days EXAM: ABDOMEN - 1 VIEW COMPARISON:  01/09/2016 FINDINGS: Mild gaseous distention of large and small bowel. Decreasing distention of small bowel. Favor mild ileus. No organomegaly or free air. No suspicious calcification. No acute bony abnormality. IMPRESSION: Improving bowel gas pattern with decreasing gaseous distention of small bowel.  Mild gaseous distention of large and small bowel loops likely reflect ileus. Electronically Signed   By: Rolm Baptise M.D.   On: 01/10/2016 07:09   Dg Abd Acute W/chest  01/09/2016  CLINICAL DATA:  40 year old female with right abdominal pain, nausea and vomiting for 2 days. EXAM: DG ABDOMEN ACUTE W/ 1V CHEST COMPARISON:  01/08/2016 CT and prior exams. FINDINGS: The cardiomediastinal silhouette is unremarkable. There is no evidence of airspace disease, pleural effusion or pneumothorax. Now identified are mildly distended loops of small bowel with air-fluid levels. There is no evidence of pneumoperitoneum. Small amount of gas in the colon is noted. IMPRESSION: New mildly distended small bowel loops with air-fluid levels. This may represent an ileus or possibly developing small bowel obstruction. No evidence of pneumoperitoneum. No evidence of acute cardiopulmonary disease. Electronically Signed   By: Margarette Canada M.D.   On: 01/09/2016 13:56    Scheduled Meds: . cefTRIAXone (ROCEPHIN)  IV  1 g Intravenous Q24H  . enoxaparin (LOVENOX) injection  40 mg Subcutaneous Q24H  . ketorolac  30 mg Intravenous 4 times per day  . LORazepam  0.5 mg Intravenous QHS,MR X 1  . pantoprazole (PROTONIX) IV  40 mg Intravenous Daily  . polyethylene glycol  17 g Oral BID  . saccharomyces boulardii  250 mg Oral BID   Continuous Infusions: . sodium chloride 75 mL/hr at 01/09/16 1801    Active Problems:   Abdominal pain   Generalized abdominal pain   Nausea with vomiting   Tobacco abuse   Abdominal pain in female   Constipation    Time spent: 25 minutes    [facility] Pager [phone]*. If 7PM-7AM, please contact night-coverage at [email or web address], password [email or web address] 01/10/2016, 11:42 AM

## 2016-01-10 NOTE — Progress Notes (Signed)
Administered approximately 500 cc of SMOG enema to patient at 2100 last pm.  She was unable to tolerate more. After enema, she had a moderate amount of loose green stool mixed with urine.

## 2016-01-11 DIAGNOSIS — K5900 Constipation, unspecified: Secondary | ICD-10-CM

## 2016-01-11 LAB — BASIC METABOLIC PANEL
Anion gap: 7 (ref 5–15)
BUN: 5 mg/dL — ABNORMAL LOW (ref 6–20)
CO2: 24 mmol/L (ref 22–32)
Calcium: 8.5 mg/dL — ABNORMAL LOW (ref 8.9–10.3)
Chloride: 107 mmol/L (ref 101–111)
Creatinine, Ser: 0.61 mg/dL (ref 0.44–1.00)
GFR calc Af Amer: >60 mL/min (ref >60)
GFR calc non Af Amer: >60 mL/min (ref >60)
Glucose, Bld: 98 mg/dL (ref 65–99)
Potassium: 3.6 mmol/L (ref 3.5–5.1)
Sodium: 138 mmol/L (ref 135–145)

## 2016-01-11 MED ORDER — PEG 3350-KCL-NA BICARB-NACL 420 G PO SOLR
4000.0000 mL | Freq: Once | ORAL | Status: AC
  Administered 2016-01-11: 4000 mL via ORAL
  Filled 2016-01-11: qty 4000

## 2016-01-11 NOTE — Progress Notes (Signed)
Pt. Instructed to drink Go-lytely: 8 ounces every half our as she can tolerate it. Pt. Agrees.

## 2016-01-11 NOTE — Progress Notes (Signed)
[facility] PROGRESS NOTE  GRACYNN WONDERLY [id number] DOB: 05-04-1976 DOA: 01/09/2016 PCP: [facility]  Assessment/Plan: 1. Constipation/ileus- patient came with abdominal pain and constipation. KUB showed fluid-filled loops of small bowel with air-fluid level, repeat x-ray abdomen this morning shows improvement in ileus. Patient started on MiraLAX 17 g by mouth twice a day and given milk and molasses enema with no effect. I called and discussed with GI on call Dr. Collene Mares, who recommended to start GoLYTELY. She will see the patient today. 2. UTI- patient was diagnosed with UTI on 01/08/2016 at that time UA showed positive nitrite with WBCs. Patient was started on Levaquin at home she took 1 tablet of Levaquin before coming to the hospital. Urine culture has been obtained during this admission. We'll start the patient on ceftriaxone 1 g IV daily. 3. Anxiety- continue when necessary Ativan 4. Hypokalemia- replace potassium and check BMP in a.m. 5. Chronic pain syndrome- patient takes oxycodone when necessary home. Will start Dilaudid 0.5 mg every 4 hours when necessary the hospital.  Code Status: Full code Family Communication: *No family present at bedside Disposition Plan: Pending improvement in constipation   Consultants:  None  Procedures:  None  Antibiotics:  Ceftriaxone  HPI/Subjective: 40 yo F with previous episodes of abdominal pain and per notes has been on oxycodone, however, she denies chronic narcotic use. She denies previous history of constipation but over the last several weeks has had severe constipation not relieved by enemas, suppositories, magnesium citrate, miralax resulting in several recent ER visits. She has never had any thyroid problems or blood in her stools. She has been otherwise healthy. She had refractory emesis in the ER, VSS. Labs not particularly suggestive of dehydration with normal BUN and creatinine. CT from ER visit yesterday  demonstrated constipation without obstruction and KUB today demonstrated fluid filled loops of small bowel with air-fluid level.   Patient was given milk and molasses enema yesterday, no bowel movement yet.  Objective: Filed Vitals:   01/10/16 2142 01/11/16 0516  BP: 111/65 140/59  Pulse: 83 64  Temp: 98.2 F (36.8 C) 97.9 F (36.6 C)  Resp: 19 18    Intake/Output Summary (Last 24 hours) at 01/11/16 1404 Last data filed at 01/10/16 1900  Gross per 24 hour  Intake    280 ml  Output      0 ml  Net    280 ml   Filed Weights   01/09/16 1657  Weight: 74.39 kg (164 lb)    Exam:   General:  Appears in no acute distress  Cardiovascular: S1-S2 regular  Respiratory: Clear to auscultation bilaterally  Abdomen: Soft, abdomen isl distended, nontender to palpation  Musculoskeletal: No cyanosis/clubbing/edema of the lower extremities   Data Reviewed: Basic Metabolic Panel:  Recent Labs Lab 01/06/16 0310 01/07/16 2320 01/09/16 1356 01/10/16 0727 01/11/16 0725  NA 138 139 138 142 138  K 3.5 3.6 3.6 3.3* 3.6  CL 102 105 99* 104 107  CO2 26 24 23 27 24   GLUCOSE 129* 106* 102* 80 98  BUN 5* 6 7 11  5*  CREATININE 0.78 0.63 0.69 0.74 0.61  CALCIUM 9.1 9.0 9.5 8.7* 8.5*   Liver Function Tests:  Recent Labs Lab 01/06/16 0310 01/09/16 1356  AST 20 23  ALT 18 27  ALKPHOS 72 77  BILITOT 0.6 1.1  PROT 8.8* 8.2*  ALBUMIN 4.7 3.6    Recent Labs Lab 01/09/16 1356  LIPASE 15   CBC:  Recent Labs Lab  01/06/16 0310 01/07/16 2320 01/09/16 1356 01/10/16 0727  WBC 12.9* 15.4* 10.0 6.7  NEUTROABS 11.6*  --   --   --   HGB 10.8* 11.0* 11.8* 9.6*  HCT 33.6* 34.4* 35.3* 29.8*  MCV 89.8 90.3 86.1 87.4  PLT 244 235 228 219     Studies: Dg Abd 1 View  01/10/2016  CLINICAL DATA:  Abdominal pain, constipation for 4 days EXAM: ABDOMEN - 1 VIEW COMPARISON:  01/09/2016 FINDINGS: Mild gaseous distention of large and small bowel. Decreasing distention of small bowel.  Favor mild ileus. No organomegaly or free air. No suspicious calcification. No acute bony abnormality. IMPRESSION: Improving bowel gas pattern with decreasing gaseous distention of small bowel. Mild gaseous distention of large and small bowel loops likely reflect ileus. Electronically Signed   By: Rolm Baptise M.D.   On: 01/10/2016 07:09    Scheduled Meds: . cefTRIAXone (ROCEPHIN)  IV  1 g Intravenous Q24H  . enoxaparin (LOVENOX) injection  40 mg Subcutaneous Q24H  . LORazepam  0.5 mg Intravenous QHS,MR X 1  . nicotine  21 mg Transdermal Daily  . pantoprazole  40 mg Oral Daily  . polyethylene glycol-electrolytes  4,000 mL Oral Once  . saccharomyces boulardii  250 mg Oral BID   Continuous Infusions: . sodium chloride 75 mL/hr at 01/10/16 2024    Active Problems:   Abdominal pain   Generalized abdominal pain   Nausea with vomiting   Tobacco abuse   Abdominal pain in female   Constipation    Time spent: 25 minutes    [facility] Pager [phone]*. If 7PM-7AM, please contact night-coverage at [email or web address], password [facility] 01/11/2016, 2:04 PM  LOS: 1 day

## 2016-01-11 NOTE — Consult Note (Signed)
UNASSIGNED PATIENT Reason for Consult:Abdominal distention with constipation. Referring Physician: THP  [name] is an 40 y.o. female.  HPI: 34 -year-old black female, admitted to the hospital with a recent history of worsening constipation. According to the review of the chart the patient claims she's not been able to facilitate a bowel movement in spite of several interventions including enemas and Miralax, senna etc. abdominal films on admission revealed an ileus patient had a small-volume bowel movement with the enemas and a couple doses of Miralax. Patient admits taking oxycodone for menstrual cramps and back pain  Past Medical History  Diagnosis Date  . Heart murmur   . Fibroids   . Anemia    Past Surgical History  Procedure Laterality Date  . Cesarean section    . Tubal ligation    . Ankle surgery     Family History  Problem Relation Age of Onset  . Diabetes Maternal Grandmother   . Hypertension Maternal Aunt    Social History:  reports that she has been smoking Cigarettes.  She has been smoking about 0.50 packs per day. She has never used smokeless tobacco. She reports that she does not drink alcohol or use illicit drugs.  Allergies: No Known Allergies  Medications: I have reviewed the patient's current medications.  Results for orders placed or performed during the hospital encounter of 01/09/16 (from the past 48 hour(s))  Urinalysis, Routine w reflex microscopic (not at [facility])     Status: Abnormal   Collection Time: 01/09/16  3:05 PM  Result Value Ref Range   Color, Urine AMBER (A) YELLOW    Comment: BIOCHEMICALS MAY BE AFFECTED BY COLOR   APPearance HAZY (A) CLEAR   Specific Gravity, Urine 1.035 (H) 1.005 - 1.030   pH 6.0 5.0 - 8.0   Glucose, UA NEGATIVE NEGATIVE mg/dL   Hgb urine dipstick MODERATE (A) NEGATIVE   Bilirubin Urine SMALL (A) NEGATIVE   Ketones, ur 40 (A) NEGATIVE mg/dL   Protein, ur 100 (A) NEGATIVE mg/dL   Nitrite NEGATIVE NEGATIVE   Leukocytes, UA TRACE (A) NEGATIVE  Urine culture     Status: None   Collection Time: 01/09/16  3:05 PM  Result Value Ref Range   Specimen Description URINE, CLEAN CATCH    Special Requests Normal    Culture NO GROWTH 1 DAY    Report Status 01/10/2016 FINAL   Urine microscopic-add on     Status: Abnormal   Collection Time: 01/09/16  3:05 PM  Result Value Ref Range   Squamous Epithelial / LPF 0-5 (A) NONE SEEN   WBC, UA 0-5 0 - 5 WBC/hpf   RBC / HPF 6-30 0 - 5 RBC/hpf   Bacteria, UA FEW (A) NONE SEEN   Urine-Other MUCOUS PRESENT   Basic metabolic panel     Status: Abnormal   Collection Time: 01/10/16  7:27 AM  Result Value Ref Range   Sodium 142 135 - 145 mmol/L   Potassium 3.3 (L) 3.5 - 5.1 mmol/L   Chloride 104 101 - 111 mmol/L   CO2 27 22 - 32 mmol/L   Glucose, Bld 80 65 - 99 mg/dL   BUN 11 6 - 20 mg/dL   Creatinine, Ser 0.74 0.44 - 1.00 mg/dL   Calcium 8.7 (L) 8.9 - 10.3 mg/dL   GFR calc non Af Amer >60 >60 mL/min   GFR calc Af Amer >60 >60 mL/min    Comment: (NOTE) The eGFR has been calculated using the CKD EPI equation.  This calculation has not been validated in all clinical situations. eGFR's persistently <60 mL/min signify possible Chronic Kidney Disease.    Anion gap 11 5 - 15  CBC     Status: Abnormal   Collection Time: 01/10/16  7:27 AM  Result Value Ref Range   WBC 6.7 4.0 - 10.5 K/uL   RBC 3.41 (L) 3.87 - 5.11 MIL/uL   Hemoglobin 9.6 (L) 12.0 - 15.0 g/dL   HCT 29.8 (L) 36.0 - 46.0 %   MCV 87.4 78.0 - 100.0 fL   MCH 28.2 26.0 - 34.0 pg   MCHC 32.2 30.0 - 36.0 g/dL   RDW 15.4 11.5 - 15.5 %   Platelets 219 150 - 400 K/uL  TSH     Status: None   Collection Time: 01/10/16  7:27 AM  Result Value Ref Range   TSH 0.672 0.350 - 4.500 uIU/mL  Basic metabolic panel     Status: Abnormal   Collection Time: 01/11/16  7:25 AM  Result Value Ref Range   Sodium 138 135 - 145 mmol/L   Potassium 3.6 3.5 - 5.1 mmol/L   Chloride 107 101 - 111 mmol/L   CO2 24 22 - 32  mmol/L   Glucose, Bld 98 65 - 99 mg/dL   BUN 5 (L) 6 - 20 mg/dL   Creatinine, Ser 0.61 0.44 - 1.00 mg/dL   Calcium 8.5 (L) 8.9 - 10.3 mg/dL   GFR calc non Af Amer >60 >60 mL/min   GFR calc Af Amer >60 >60 mL/min    Comment: (NOTE) The eGFR has been calculated using the CKD EPI equation. This calculation has not been validated in all clinical situations. eGFR's persistently <60 mL/min signify possible Chronic Kidney Disease.    Anion gap 7 5 - 15   Dg Abd 1 View  01/10/2016  CLINICAL DATA:  Abdominal pain, constipation for 4 days EXAM: ABDOMEN - 1 VIEW COMPARISON:  01/09/2016 FINDINGS: Mild gaseous distention of large and small bowel. Decreasing distention of small bowel. Favor mild ileus. No organomegaly or free air. No suspicious calcification. No acute bony abnormality. IMPRESSION: Improving bowel gas pattern with decreasing gaseous distention of small bowel. Mild gaseous distention of large and small bowel loops likely reflect ileus. Electronically Signed   By: Rolm Baptise M.D.   On: 01/10/2016 07:09   Review of Systems  HENT: Negative.   Eyes: Negative.   Respiratory: Negative.   Cardiovascular: Negative.   Gastrointestinal: Positive for abdominal pain and constipation. Negative for blood in stool and melena.  Musculoskeletal: Positive for back pain and joint pain.  Skin: Negative.   Neurological: Negative.   Endo/Heme/Allergies: Negative.   Psychiatric/Behavioral: Positive for substance abuse. Negative for depression, suicidal ideas, hallucinations and memory loss. The patient is nervous/anxious.    Blood pressure 140/59, pulse 64, temperature 97.9 F (36.6 C), temperature source Axillary, resp. rate 18, height 5' 4"  (1.626 m), weight 74.39 kg (164 lb), last menstrual period 12/28/2015, SpO2 99 %. Physical Exam  Constitutional: She is oriented to person, place, and time. She appears well-developed and well-nourished.  HENT:  Head: Normocephalic and atraumatic.  Eyes:  Conjunctivae and EOM are normal. Pupils are equal, round, and reactive to light.  Neck: Normal range of motion. Neck supple.  Cardiovascular: Normal rate and regular rhythm.   Respiratory: Effort normal and breath sounds normal.  GI: Soft. She exhibits distension. She exhibits no mass. There is no tenderness. There is no rebound and no guarding.  Musculoskeletal: Normal  range of motion.  Neurological: She is alert and oriented to person, place, and time.  Skin: Skin is warm and dry.  Psychiatric: She has a normal mood and affect. Her behavior is normal. Judgment and thought content normal.   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 We will try him a gallon of Miralax today and see how she responds to this. She should strongly be encouraged to stop the use of narcotic medications as these were both noncontributory chronicity of this problem. Repeat films in AM. 2) Chronic iron deficiency anemia from heavy menstrual cycles/fibroids.Marland Kitchen   Mohini Heathcock 01/11/2016, 3:03 PM

## 2016-01-12 ENCOUNTER — Inpatient Hospital Stay (HOSPITAL_COMMUNITY): Payer: Medicaid Other

## 2016-01-12 DIAGNOSIS — Z720 Tobacco use: Secondary | ICD-10-CM

## 2016-01-12 MED ORDER — LEVOFLOXACIN 750 MG PO TABS: 750.0000 mg | ORAL_TABLET | Freq: Every day | ORAL | Status: DC

## 2016-01-12 MED ORDER — METRONIDAZOLE 500 MG PO TABS: 500.0000 mg | ORAL_TABLET | Freq: Three times a day (TID) | ORAL | Status: DC

## 2016-01-12 MED ORDER — OXYCODONE-ACETAMINOPHEN 5-325 MG PO TABS
1.0000 | ORAL_TABLET | Freq: Three times a day (TID) | ORAL | Status: DC | PRN
  Administered 2016-01-12: 1 via ORAL
  Filled 2016-01-12: qty 1

## 2016-01-12 MED ORDER — SACCHAROMYCES BOULARDII 250 MG PO CAPS: 250.0000 mg | ORAL_CAPSULE | Freq: Two times a day (BID) | ORAL | Status: DC

## 2016-01-12 MED ORDER — OXYCODONE-ACETAMINOPHEN 5-325 MG PO TABS: 1.0000 | ORAL_TABLET | Freq: Four times a day (QID) | ORAL | Status: DC | PRN

## 2016-01-12 MED ORDER — HYDROCODONE-ACETAMINOPHEN 5-325 MG PO TABS
1.0000 | ORAL_TABLET | Freq: Four times a day (QID) | ORAL | Status: DC | PRN
  Administered 2016-01-12: 1 via ORAL
  Filled 2016-01-12: qty 1

## 2016-01-12 NOTE — Progress Notes (Signed)
Per MD pt. Is okay to not have IV and to not receive IV Rocephin.

## 2016-01-12 NOTE — Progress Notes (Signed)
Went over discharge papers and educated pt. On medications, discharge orders, follow-up appointments, etc. Pt. Stated no questions/complaints. Discharge papers were signed. Doctors note given to pt. For work. IV out. Pt. States she wants to wait on lunch and then she will leave. Instructed pt. To call me when she's ready so I can call for a wheelchair.

## 2016-01-12 NOTE — Discharge Summary (Addendum)
Physician Discharge Summary  [name] [id number] DOB: 1976-07-07 DOA: 01/09/2016  PCP: [facility]  Admit date: 01/09/2016 Discharge date: 01/12/2016  Time spent: 25* minutes  Recommendations for Outpatient Follow-up:  1. Follow-up PCP in 2 weeks 2. Continue Levaquin and Flagyl for 5 days 3. Limit narcotic use   Discharge Diagnoses:  Active Problems:   Abdominal pain   Generalized abdominal pain   Nausea with vomiting   Tobacco abuse   Abdominal pain in female   Constipation   Discharge Condition: Stable  Diet recommendation: Regular  Filed Weights   01/09/16 1657  Weight: 74.39 kg (164 lb)    History of present illness:  40 yo F with previous episodes of abdominal pain and per notes has been on oxycodone, however, she denies chronic narcotic use. She denies previous history of constipation but over the last several weeks has had severe constipation not relieved by enemas, suppositories, magnesium citrate, miralax resulting in several recent ER visits. She has never had any thyroid problems or blood in her stools. She has been otherwise healthy. She had refractory emesis in the ER, VSS. Labs not particularly suggestive of dehydration with normal BUN and creatinine. CT from ER visit yesterday demonstrated constipation without obstruction and KUB today demonstrated fluid filled loops of small bowel with air-fluid level.   Hospital Course:  1. Constipation/ileus- patient came with abdominal pain and constipation. KUB showed fluid-filled loops of small bowel with air-fluid level, repeat x-ray abdomen this morning shows improvement in ileus. Patient started on MiraLAX 17 g by mouth twice a day and given milk and molasses enema with no effect. I called and discussed with GI on call Dr. Collene Mares, who recommended to start GoLYTELY. Patient took GoLYTELY and had many bowel movements. Repeat abdomen x-ray today this morning showed multiple gas fluid levels within the  non-pathologically distended small and large bowel loops. Finding suggestive of enteritis/colitis.  Patient will be discharged home on Levaquin and Flagyl for 5 days.  2. UTI- patient was diagnosed with UTI on 01/08/2016 at that time UA showed positive nitrite with WBCs. Patient was started on Levaquin at home she took 1 tablet of Levaquin before coming to the hospital. Urine culture has been obtained during this admission. We started the patient on ceftriaxone in the hospital. At this time she will be discharged back on Levaquin for 5 more days as above. 3. Anxiety- continue Xanax when necessary. 4. Hypokalemia-replaced 5. Chronic pain syndrome- patient takes oxycodone when necessary home. Patient complained of continuous abdominal pain in the hospital so she was started on  Dilaudid 0.5 mg every 4 hours when necessary the hospital. At this time we have advised the patient to limit narcotic use but, due to continuous pain she is requiring low-dose opioids.   Procedures:  None  Consultations:  Gastroenterology  Discharge Exam: Filed Vitals:   01/11/16 2108 01/12/16 0551  BP: 114/58 131/71  Pulse: 67 62  Temp: 98.4 F (36.9 C) 98.3 F (36.8 C)  Resp: 18 18    General: Appears in no acute distress Cardiovascular: S1-S2 regular Respiratory: Clear to auscultation bilaterally Abdomen- soft, mild distention, nontender to palpation  Discharge Instructions   Discharge Instructions    Diet - low sodium heart healthy    Complete by:  As directed      Increase activity slowly    Complete by:  As directed           Current Discharge Medication List    START  taking these medications   Details  metroNIDAZOLE (FLAGYL) 500 MG tablet Take 1 tablet (500 mg total) by mouth 3 (three) times daily. Qty: 15 tablet, Refills: 0    saccharomyces boulardii (FLORASTOR) 250 MG capsule Take 1 capsule (250 mg total) by mouth 2 (two) times daily. Qty: 60 capsule, Refills: 2      CONTINUE these  medications which have CHANGED   Details  levofloxacin (LEVAQUIN) 750 MG tablet Take 1 tablet (750 mg total) by mouth daily. Qty: 5 tablet, Refills: 0           CONTINUE these medications which have NOT CHANGED   Details  ALPRAZolam (XANAX) 0.25 MG tablet Take 0.25 mg by mouth 3 (three) times daily as needed for anxiety.    bisacodyl (DULCOLAX) 10 MG suppository Place 1 suppository (10 mg total) rectally as needed for moderate constipation. Qty: 12 suppository, Refills: 0    ibuprofen (ADVIL,MOTRIN) 800 MG tablet Take 1 tablet (800 mg total) by mouth 3 (three) times daily. Qty: 21 tablet, Refills: 0    polyethylene glycol powder (GLYCOLAX/MIRALAX) powder 1 capful daily as needed for constipation Qty: 255 g, Refills: 0    Vitamin D, Ergocalciferol, (DRISDOL) 50000 UNITS CAPS capsule Take 50,000 Units by mouth every 7 (seven) days.      STOP taking these medications     acetaminophen (TYLENOL) 500 MG tablet        No Known Allergies Follow-up Information    Follow up with [facility] In 2 weeks.   Specialty:  Internal Medicine   Contact information:   [address] [place] [address] [phone]        The results of significant diagnostics from this hospitalization (including imaging, microbiology, ancillary and laboratory) are listed below for reference.    Significant Diagnostic Studies: Dg Abd 1 View  01/10/2016  CLINICAL DATA:  Abdominal pain, constipation for 4 days EXAM: ABDOMEN - 1 VIEW COMPARISON:  01/09/2016 FINDINGS: Mild gaseous distention of large and small bowel. Decreasing distention of small bowel. Favor mild ileus. No organomegaly or free air. No suspicious calcification. No acute bony abnormality. IMPRESSION: Improving bowel gas pattern with decreasing gaseous distention of small bowel. Mild gaseous distention of large and small bowel loops likely reflect ileus. Electronically Signed   By: Rolm Baptise M.D.   On: 01/10/2016 07:09   Mr  Pelvis W Wo Contrast  01/06/2016  CLINICAL DATA:  Uterine fibroids, pre  uterine artery embolization. EXAM: MRI PELVIS WITHOUT AND WITH CONTRAST TECHNIQUE: Multiplanar multisequence MR imaging of the pelvis was performed both before and after administration of intravenous contrast. CONTRAST:  48mL MULTIHANCE GADOBENATE DIMEGLUMINE 529 MG/ML IV SOLN COMPARISON:  12/29/2015 and 07/14/2015 pelvic ultrasound. CT of 04/30/2015. FINDINGS: Portions of exam are mildly motion degraded. Normal pelvic bowel loops. No pelvic adenopathy. Normal urinary bladder. Both ovaries are normal, including on image 17/ series 5 and image 19/ series 5. Small volume cul-de-sac fluid, slightly greater than typically seen physiologically. Cesarean section defect within the anterior uterine body on image 15/series 6. The endometrium is normal. Given the above described motion, no uterine fibroids are identified. No correlate for the ultrasound abnormality of last August within the uterine fundus. IMPRESSION: 1. No evidence of a uterine fibroids. 2. Motion degraded exam. 3. Small volume pelvic fluid, greater than typically seen physiologically. This is of indeterminate etiology. Electronically Signed   By: Abigail Miyamoto M.D.   On: 01/06/2016 09:27   US Transvaginal Non-ob  12/29/2015  CLINICAL DATA:  Chronic right lower quadrant abdominal pain. EXAM: TRANSABDOMINAL AND TRANSVAGINAL ULTRASOUND OF PELVIS TECHNIQUE: Both transabdominal and transvaginal ultrasound examinations of the pelvis were performed. Transabdominal technique was performed for global imaging of the pelvis including uterus, ovaries, adnexal regions, and pelvic cul-de-sac. It was necessary to proceed with endovaginal exam following the transabdominal exam to visualize the endometrium and ovaries. COMPARISON:  July 14, 2015. FINDINGS: Uterus Measurements: 13.7.8 x 5.7 cm. Focal echogenicity is noted anteriorly and uterine fundus which may represent calcification related to  small uterine fibroid. Endometrium Thickness: 19 mm which is abnormally thick. No focal abnormality seen. Right ovary Measurements: 2.7 x 1.8 x 1.6 cm. Follicular cysts are noted. Left ovary Not visualized due to overlying bowel gas. Other findings No abnormal free fluid. IMPRESSION: Endometrial thickness is considered abnormal. Consider follow-up by [place] in 6-8 weeks, during the week immediately following menses (exam timing is critical). Left ovary is not visualized due to overlying bowel gas. Right ovary appears normal. Electronically Signed   By: Marijo Conception, M.D.   On: 12/29/2015 14:32   US Pelvis Complete  12/29/2015  CLINICAL DATA:  Chronic right lower quadrant abdominal pain. EXAM: TRANSABDOMINAL AND TRANSVAGINAL ULTRASOUND OF PELVIS TECHNIQUE: Both transabdominal and transvaginal ultrasound examinations of the pelvis were performed. Transabdominal technique was performed for global imaging of the pelvis including uterus, ovaries, adnexal regions, and pelvic cul-de-sac. It was necessary to proceed with endovaginal exam following the transabdominal exam to visualize the endometrium and ovaries. COMPARISON:  July 14, 2015. FINDINGS: Uterus Measurements: 13.7.8 x 5.7 cm. Focal echogenicity is noted anteriorly and uterine fundus which may represent calcification related to small uterine fibroid. Endometrium Thickness: 19 mm which is abnormally thick. No focal abnormality seen. Right ovary Measurements: 2.7 x 1.8 x 1.6 cm. Follicular cysts are noted. Left ovary Not visualized due to overlying bowel gas. Other findings No abnormal free fluid. IMPRESSION: Endometrial thickness is considered abnormal. Consider follow-up by [place] in 6-8 weeks, during the week immediately following menses (exam timing is critical). Left ovary is not visualized due to overlying bowel gas. Right ovary appears normal. Electronically Signed   By: Marijo Conception, M.D.   On: 12/29/2015 14:32   Ct Abdomen Pelvis W Contrast  01/08/2016   CLINICAL DATA:  Abdominal pain and distention. No bowel movement for 1 week. EXAM: CT ABDOMEN AND PELVIS WITH CONTRAST TECHNIQUE: Multidetector CT imaging of the abdomen and pelvis was performed using the standard protocol following bolus administration of intravenous contrast. CONTRAST:  68mL OMNIPAQUE IOHEXOL 300 MG/ML SOLN, 169mL OMNIPAQUE IOHEXOL 300 MG/ML SOLN COMPARISON:  MRI pelvis 01/06/2016. CT abdomen and pelvis 04/30/2015. FINDINGS: Airspace infiltrative changes in both lung bases, greater on the right. This may represent atelectasis or multifocal pneumonia. The liver, spleen, gallbladder, pancreas, adrenal glands, abdominal aorta, inferior vena cava, and retroperitoneal lymph nodes are unremarkable. Cyst in the anterior right kidney. No hydronephrosis in either kidney. Stomach is decompressed. Small bowel are decompressed. Contrast material flows through to the colon without evidence of bowel obstruction. No free air or free fluid in the abdomen. Colon is diffusely stool-filled. There does appear to be colonic wall thickening involving the transverse colon and hepatic flexure although this could just be due to adherent stool. Colitis is not excluded. Pelvis: Uterus and ovaries are not enlarged. Endometrial stripe is somewhat thickened and fluid-filled. Calcification in the anterior uterus possibly representing small fibroid. No evidence of diverticulitis. No free or loculated pelvic fluid collections. No pelvic mass  or lymphadenopathy. Appendix is not identified. IMPRESSION: Diffusely stool-filled colon suggesting constipation. No evidence of bowel obstruction. Possible thickening of the wall of the right colon and hepatic flexure. This may be due to adherent stool but colitis is not excluded. Airspace infiltration in the lung bases suggest pneumonia or less likely atelectasis. Endometrial cavity is prominent and fluid-filled. Possibly physiologic. Electronically Signed   By: Lucienne Capers M.D.   On:  01/08/2016 02:20   Dg Abd 2 Views  01/12/2016  CLINICAL DATA:  Low abdominal pain and constipation for the last week. EXAM: ABDOMEN - 2 VIEW COMPARISON:  01/10/2016 FINDINGS: The bowel gas pattern is nonobstructive. Multiple gas fluid levels are seen within non pathologically distended small and large bowel loops. No radiographic evidence of organomegaly. There is no evidence of free air. No radio-opaque calculi or other significant radiographic abnormality is seen. IMPRESSION: Multiple gas fluid levels within non pathologically distended small and large bowel loops. Findings are suggestive of enteritis/colitis. Electronically Signed   By: Fidela Salisbury M.D.   On: 01/12/2016 07:40   Dg Abd Acute W/chest  01/09/2016  CLINICAL DATA:  40 year old female with right abdominal pain, nausea and vomiting for 2 days. EXAM: DG ABDOMEN ACUTE W/ 1V CHEST COMPARISON:  01/08/2016 CT and prior exams. FINDINGS: The cardiomediastinal silhouette is unremarkable. There is no evidence of airspace disease, pleural effusion or pneumothorax. Now identified are mildly distended loops of small bowel with air-fluid levels. There is no evidence of pneumoperitoneum. Small amount of gas in the colon is noted. IMPRESSION: New mildly distended small bowel loops with air-fluid levels. This may represent an ileus or possibly developing small bowel obstruction. No evidence of pneumoperitoneum. No evidence of acute cardiopulmonary disease. Electronically Signed   By: Margarette Canada M.D.   On: 01/09/2016 13:56    Microbiology: Recent Results (from the past 240 hour(s))  Urine culture     Status: None   Collection Time: 01/09/16  3:05 PM  Result Value Ref Range Status   Specimen Description URINE, CLEAN CATCH  Final   Special Requests Normal  Final   Culture NO GROWTH 1 DAY  Final   Report Status 01/10/2016 FINAL  Final     Labs: Basic Metabolic Panel:  Recent Labs Lab 01/06/16 0310 01/07/16 2320 01/09/16 1356  01/10/16 0727 01/11/16 0725  NA 138 139 138 142 138  K 3.5 3.6 3.6 3.3* 3.6  CL 102 105 99* 104 107  CO2 26 24 23 27 24   GLUCOSE 129* 106* 102* 80 98  BUN 5* 6 7 11  5*  CREATININE 0.78 0.63 0.69 0.74 0.61  CALCIUM 9.1 9.0 9.5 8.7* 8.5*   Liver Function Tests:  Recent Labs Lab 01/06/16 0310 01/09/16 1356  AST 20 23  ALT 18 27  ALKPHOS 72 77  BILITOT 0.6 1.1  PROT 8.8* 8.2*  ALBUMIN 4.7 3.6    Recent Labs Lab 01/09/16 1356  LIPASE 15   No results for input(s): AMMONIA in the last 168 hours. CBC:  Recent Labs Lab 01/06/16 0310 01/07/16 2320 01/09/16 1356 01/10/16 0727  WBC 12.9* 15.4* 10.0 6.7  NEUTROABS 11.6*  --   --   --   HGB 10.8* 11.0* 11.8* 9.6*  HCT 33.6* 34.4* 35.3* 29.8*  MCV 89.8 90.3 86.1 87.4  PLT 244 235 228 219        Signed:  LAMA,GAGAN S MD.  [facility] 01/12/2016, 11:57 AM

## 2016-01-13 ENCOUNTER — Telehealth: Payer: Self-pay | Admitting: Interventional Radiology

## 2016-01-13 NOTE — Telephone Encounter (Signed)
I discussed the results of the contrast enhanced pelvic MRI obtained 01/06/2016 with the patient.  Given the absence of any enhancing uterine fibroids, this patient is not a candidate for uterine fibroid embolization.  I explained that the patient's menorrhagia is attributable to another etiology which will best be determined by her referring gynecologist, Dr. Ruthann Cancer and she should follow up with him in the near future.  The patient demonstrated excellent understanding of this discussion.  Ronny Bacon, MD Pager #: [phone]

## 2016-01-28 ENCOUNTER — Inpatient Hospital Stay (HOSPITAL_COMMUNITY)
Admission: EM | Admit: 2016-01-28 | Discharge: 2016-02-05 | DRG: 390 | Disposition: A | Discharge status: Home / Self Care | Payer: Medicaid Other | Attending: Internal Medicine | Admitting: Internal Medicine

## 2016-01-28 ENCOUNTER — Encounter (HOSPITAL_COMMUNITY): Payer: Self-pay | Admitting: Emergency Medicine

## 2016-01-28 DIAGNOSIS — K56609 Unspecified intestinal obstruction, unspecified as to partial versus complete obstruction: Secondary | ICD-10-CM

## 2016-01-28 DIAGNOSIS — N920 Excessive and frequent menstruation with regular cycle: Secondary | ICD-10-CM | POA: Diagnosis present

## 2016-01-28 DIAGNOSIS — G8929 Other chronic pain: Secondary | ICD-10-CM | POA: Diagnosis present

## 2016-01-28 DIAGNOSIS — D259 Leiomyoma of uterus, unspecified: Secondary | ICD-10-CM | POA: Diagnosis present

## 2016-01-28 DIAGNOSIS — D649 Anemia, unspecified: Secondary | ICD-10-CM | POA: Diagnosis present

## 2016-01-28 DIAGNOSIS — F17210 Nicotine dependence, cigarettes, uncomplicated: Secondary | ICD-10-CM | POA: Diagnosis present

## 2016-01-28 DIAGNOSIS — D509 Iron deficiency anemia, unspecified: Secondary | ICD-10-CM | POA: Diagnosis present

## 2016-01-28 DIAGNOSIS — E876 Hypokalemia: Secondary | ICD-10-CM | POA: Diagnosis present

## 2016-01-28 DIAGNOSIS — R112 Nausea with vomiting, unspecified: Secondary | ICD-10-CM | POA: Diagnosis present

## 2016-01-28 DIAGNOSIS — K5660 Unspecified intestinal obstruction: Principal | ICD-10-CM | POA: Diagnosis present

## 2016-01-28 DIAGNOSIS — R109 Unspecified abdominal pain: Secondary | ICD-10-CM | POA: Diagnosis present

## 2016-01-28 DIAGNOSIS — Z8249 Family history of ischemic heart disease and other diseases of the circulatory system: Secondary | ICD-10-CM

## 2016-01-28 DIAGNOSIS — Z833 Family history of diabetes mellitus: Secondary | ICD-10-CM

## 2016-01-28 LAB — CBC
HCT: 30.7 % — ABNORMAL LOW (ref 36.0–46.0)
Hemoglobin: 10.2 g/dL — ABNORMAL LOW (ref 12.0–15.0)
MCH: 29.7 pg (ref 26.0–34.0)
MCHC: 33.2 g/dL (ref 30.0–36.0)
MCV: 89.2 fL (ref 78.0–100.0)
Platelets: 317 10*3/uL (ref 150–400)
RBC: 3.44 MIL/uL — ABNORMAL LOW (ref 3.87–5.11)
RDW: 16.1 % — ABNORMAL HIGH (ref 11.5–15.5)
WBC: 11.1 10*3/uL — ABNORMAL HIGH (ref 4.0–10.5)

## 2016-01-28 LAB — DIFFERENTIAL
Basophils Absolute: 0 10*3/uL (ref 0.0–0.1)
Basophils Relative: 0 %
Eosinophils Absolute: 0 10*3/uL (ref 0.0–0.7)
Eosinophils Relative: 0 %
Lymphocytes Relative: 11 %
Lymphs Abs: 1.1 10*3/uL (ref 0.7–4.0)
Monocytes Absolute: 0.2 10*3/uL (ref 0.1–1.0)
Monocytes Relative: 2 %
Neutro Abs: 8.4 10*3/uL — ABNORMAL HIGH (ref 1.7–7.7)
Neutrophils Relative %: 87 %

## 2016-01-28 LAB — COMPREHENSIVE METABOLIC PANEL
ALT: 10 U/L — ABNORMAL LOW (ref 14–54)
AST: 12 U/L — ABNORMAL LOW (ref 15–41)
Albumin: 3.9 g/dL (ref 3.5–5.0)
Alkaline Phosphatase: 69 U/L (ref 38–126)
Anion gap: 10 (ref 5–15)
BUN: <5 mg/dL — ABNORMAL LOW (ref 6–20)
CO2: 23 mmol/L (ref 22–32)
Calcium: 8.9 mg/dL (ref 8.9–10.3)
Chloride: 105 mmol/L (ref 101–111)
Creatinine, Ser: 0.67 mg/dL (ref 0.44–1.00)
GFR calc Af Amer: >60 mL/min (ref >60)
GFR calc non Af Amer: >60 mL/min (ref >60)
Glucose, Bld: 126 mg/dL — ABNORMAL HIGH (ref 65–99)
Potassium: 3.1 mmol/L — ABNORMAL LOW (ref 3.5–5.1)
Sodium: 138 mmol/L (ref 135–145)
Total Bilirubin: 0.5 mg/dL (ref 0.3–1.2)
Total Protein: 7.3 g/dL (ref 6.5–8.1)

## 2016-01-28 LAB — LIPASE, BLOOD: Lipase: 12 U/L (ref 11–51)

## 2016-01-28 LAB — I-STAT CG4 LACTIC ACID, ED: Lactic Acid, Venous: 0.86 mmol/L (ref 0.5–2.0)

## 2016-01-28 MED ORDER — SODIUM CHLORIDE 0.9 % IV BOLUS (SEPSIS)
1000.0000 mL | Freq: Once | INTRAVENOUS | Status: AC
Start: 2016-01-28 — End: 2016-01-29
  Administered 2016-01-28: 1000 mL via INTRAVENOUS

## 2016-01-28 MED ORDER — POTASSIUM CHLORIDE CRYS ER 20 MEQ PO TBCR
40.0000 meq | EXTENDED_RELEASE_TABLET | Freq: Once | ORAL | Status: AC
  Administered 2016-01-29: 40 meq via ORAL
  Filled 2016-01-28: qty 2

## 2016-01-28 MED ORDER — ONDANSETRON HCL 4 MG/2ML IJ SOLN
4.0000 mg | Freq: Once | INTRAMUSCULAR | Status: AC
  Administered 2016-01-28: 4 mg via INTRAVENOUS
  Filled 2016-01-28: qty 2

## 2016-01-28 NOTE — ED Notes (Signed)
Amy from lab states they will run diff to previously drawn labs

## 2016-01-28 NOTE — ED Notes (Signed)
Pelvic supplies at bedside.  Pt aware that a urine sample is needed but is unable to urinate at this time.

## 2016-01-28 NOTE — ED Notes (Signed)
Patient presents for lower abdominal pain, emesis x2, diarrhea. Denies fever/chills. Patient is continuously falling asleep during triage.

## 2016-01-28 NOTE — ED Notes (Signed)
Bed: [id number] Expected date:  Expected time:  Means of arrival:  Comments: Patient getting dressed

## 2016-01-28 NOTE — ED Notes (Signed)
IV attempt unsuccessful PA made aware. PA going to attempt IV start.

## 2016-01-28 NOTE — ED Provider Notes (Signed)
CSN: [id number]     Arrival date & time 01/28/16  1832 History   First MD Initiated Contact with Patient 01/28/16 2215     Chief Complaint  Patient presents with  . Abdominal Pain  . Emesis   HPI   [name] is a 40 y.o. female PMH significant for 4 c-sections and uterine fibroids presenting with lower abdominal pain, emesis 2, and diarrhea for 2 weeks. She describes her abdominal pain as 10 out of 10, lower in location, nonradiating, constant, aching, same as her chronic abdominal pain. She denies fevers, chills, chest pain, shortness of breath, dysuria, hematochezia.  She has had multiple ED visits for similar, had CT in January demonstrating possible colitis. She has had urinary tract infections in the past.  Past Medical History  Diagnosis Date  . Heart murmur   . Fibroids   . Anemia    Past Surgical History  Procedure Laterality Date  . Cesarean section    . Tubal ligation    . Ankle surgery     Family History  Problem Relation Age of Onset  . Diabetes Maternal Grandmother   . Hypertension Maternal Aunt    Social History  Substance Use Topics  . Smoking status: Current Every Day Smoker -- 0.50 packs/day    Types: Cigarettes  . Smokeless tobacco: Never Used  . Alcohol Use: No   OB History    Gravida Para Term Preterm AB TAB SAB Ectopic Multiple Living   4 4  4      4      Review of Systems  Ten systems are reviewed and are negative for acute change except as noted in the HPI  Allergies  Review of patient's allergies indicates no known allergies.  Home Medications   Prior to Admission medications   Medication Sig Start Date End Date Taking? Authorizing Provider  bisacodyl (DULCOLAX) 10 MG suppository Place 1 suppository (10 mg total) rectally as needed for moderate constipation. Patient not taking: Reported on 01/28/2016 01/06/16   Luvenia Redden, PA-C  ibuprofen (ADVIL,MOTRIN) 800 MG tablet Take 1 tablet (800 mg total) by mouth 3 (three) times  daily. Patient not taking: Reported on 01/28/2016 12/29/15   Courteney Lyn Mackuen, MD  levofloxacin (LEVAQUIN) 750 MG tablet Take 1 tablet (750 mg total) by mouth daily. Patient not taking: Reported on 01/28/2016 01/12/16   Oswald Hillock, MD  metroNIDAZOLE (FLAGYL) 500 MG tablet Take 1 tablet (500 mg total) by mouth 3 (three) times daily. Patient not taking: Reported on 01/28/2016 01/12/16   Oswald Hillock, MD  polyethylene glycol powder [facility]) powder 1 capful daily as needed for constipation Patient not taking: Reported on 01/28/2016 01/06/16   Luvenia Redden, PA-C  saccharomyces boulardii (FLORASTOR) 250 MG capsule Take 1 capsule (250 mg total) by mouth 2 (two) times daily. Patient not taking: Reported on 01/28/2016 01/12/16   Oswald Hillock, MD   BP 142/74 mmHg  Pulse 72  Temp(Src) 99.5 F (37.5 C) (Oral)  Resp 16  SpO2 100%  LMP 12/26/2015 Physical Exam  Constitutional: She appears well-developed and well-nourished. No distress.  HENT:  Head: Normocephalic and atraumatic.  Mouth/Throat: Oropharynx is clear and moist. No oropharyngeal exudate.  Eyes: Conjunctivae are normal. Pupils are equal, round, and reactive to light. Right eye exhibits no discharge. Left eye exhibits no discharge. No scleral icterus.  Neck: No tracheal deviation present.  Cardiovascular: Normal rate, regular rhythm, normal heart sounds and intact distal pulses.  Exam reveals  no gallop and no friction rub.   No murmur heard. Pulmonary/Chest: Effort normal and breath sounds normal. No respiratory distress. She has no wheezes. She has no rales. She exhibits no tenderness.  Abdominal: Soft. Bowel sounds are normal. She exhibits no distension and no mass. There is tenderness. There is no rebound and no guarding.  BL lower quadrant tenderness  Genitourinary:  Pelvic exam: normal external genitalia, vulva, vagina, cervix, uterus and adnexa. Minimal amount of blood in vaginal vault. Chaperone present.    Musculoskeletal: She exhibits no edema.  Lymphadenopathy:    She has no cervical adenopathy.  Neurological: She is alert. Coordination normal.  Skin: Skin is warm and dry. No rash noted. She is not diaphoretic. No erythema.  Psychiatric: She has a normal mood and affect. Her behavior is normal.  Nursing note and vitals reviewed.   ED Course  Angiocath insertion Date/Time: 01/28/2016 11:19 PM Performed by: Alisia Ferrari NICOLE Authorized by: Takari Duncombe, [place] Review Labs Reviewed  WET PREP, GENITAL - Abnormal; Notable for the following:    Clue Cells Wet Prep HPF POC PRESENT (*)    WBC, Wet Prep HPF POC MANY (*)    All other components within normal limits  COMPREHENSIVE METABOLIC PANEL - Abnormal; Notable for the following:    Potassium 3.1 (*)    Glucose, Bld 126 (*)    BUN <5 (*)    AST 12 (*)    ALT 10 (*)    All other components within normal limits  CBC - Abnormal; Notable for the following:    WBC 11.1 (*)    RBC 3.44 (*)    Hemoglobin 10.2 (*)    HCT 30.7 (*)    RDW 16.1 (*)    All other components within normal limits  DIFFERENTIAL - Abnormal; Notable for the following:    Neutro Abs 8.4 (*)    All other components within normal limits  LIPASE, BLOOD  URINALYSIS, ROUTINE W REFLEX MICROSCOPIC (NOT AT [facility])  URINE RAPID DRUG SCREEN, HOSP PERFORMED  I-STAT CG4 LACTIC ACID, ED  I-STAT BETA HCG BLOOD, ED ([facility], [facility], [facility] ONLY)  GC/CHLAMYDIA PROBE AMP ([place]) NOT AT [facility]   Dg Abd 2 Views  01/29/2016  CLINICAL DATA:  Lower abdominal pain, nausea, vomiting and diarrhea for 2 days. EXAM: ABDOMEN - 2 VIEW COMPARISON:  Abdominal radiographs January 12, 2016 FINDINGS: Scattered mildly gas distended small bowel air-fluid levels, paucity of large bowel gas. No intra-abdominal mass effect or pathologic calcification. Phleboliths LEFT pelvis. Lung bases are clear. Lower thoracic dextroscoliosis. IMPRESSION: Small bowel air-fluid levels, paucity of large bowel  gas concerning for small bowel obstruction. Electronically Signed   By: Elon Alas M.D.   On: 01/29/2016 01:42   I viewed xrays and agree with interpretation.  MDM   Final diagnoses:  None   Patient lethargic and uncomfortable appearing. Chronic abdominal pain and nausea. Fluid bolus given. Pelvic exam unremarkable. Will hold off on pain medication at this time because patient is so lethargic. Patient with slight leukocytosis of 11.1. Hypokalemia of 3.1. Clue cells on wet prep. X-ray demonstrates small bowel air-fluid levels, paucity of large bowel gas concerning for small bowel obstruction.  Medications  morphine 4 MG/ML injection 4 mg (not administered)  promethazine (PHENERGAN) injection 12.5 mg (not administered)  sodium chloride 0.9 % bolus 1,000 mL (0 mLs Intravenous Stopped 01/29/16 0032)  ondansetron (ZOFRAN) injection 4 mg (4 mg Intravenous Given 01/28/16 2303)  potassium chloride  SA (K-DUR,KLOR-CON) CR tablet 40 mEq (40 mEq Oral Given 01/29/16 0030)  dicyclomine (BENTYL) capsule 10 mg (10 mg Oral Given 01/29/16 0030)  ondansetron (ZOFRAN) injection 4 mg (4 mg Intravenous Given 01/29/16 0042)   Patient will need CT abdomen pelvis for evaluation of SBO. Informed patient of this, who is more awake at this time, is in understanding and agreement with the plan, but she states she is in pain. Will give morphine and phenergan.  Care handoff to [facility], PA-C at shift change.  Kenwood Lions, PA-C 01/29/16 0221  Dorie Rank, MD 01/30/16 [phone]

## 2016-01-29 ENCOUNTER — Observation Stay (HOSPITAL_COMMUNITY): Payer: Medicaid Other

## 2016-01-29 ENCOUNTER — Emergency Department (HOSPITAL_COMMUNITY): Payer: Medicaid Other

## 2016-01-29 ENCOUNTER — Encounter (HOSPITAL_COMMUNITY): Payer: Self-pay

## 2016-01-29 DIAGNOSIS — D259 Leiomyoma of uterus, unspecified: Secondary | ICD-10-CM | POA: Diagnosis present

## 2016-01-29 DIAGNOSIS — Z833 Family history of diabetes mellitus: Secondary | ICD-10-CM | POA: Diagnosis not present

## 2016-01-29 DIAGNOSIS — D509 Iron deficiency anemia, unspecified: Secondary | ICD-10-CM | POA: Diagnosis present

## 2016-01-29 DIAGNOSIS — K5660 Unspecified intestinal obstruction: Secondary | ICD-10-CM | POA: Diagnosis present

## 2016-01-29 DIAGNOSIS — E876 Hypokalemia: Secondary | ICD-10-CM

## 2016-01-29 DIAGNOSIS — D649 Anemia, unspecified: Secondary | ICD-10-CM | POA: Diagnosis not present

## 2016-01-29 DIAGNOSIS — R109 Unspecified abdominal pain: Secondary | ICD-10-CM | POA: Diagnosis present

## 2016-01-29 DIAGNOSIS — R112 Nausea with vomiting, unspecified: Secondary | ICD-10-CM | POA: Diagnosis not present

## 2016-01-29 DIAGNOSIS — Z8249 Family history of ischemic heart disease and other diseases of the circulatory system: Secondary | ICD-10-CM | POA: Diagnosis not present

## 2016-01-29 DIAGNOSIS — N920 Excessive and frequent menstruation with regular cycle: Secondary | ICD-10-CM | POA: Diagnosis present

## 2016-01-29 DIAGNOSIS — F17210 Nicotine dependence, cigarettes, uncomplicated: Secondary | ICD-10-CM | POA: Diagnosis present

## 2016-01-29 DIAGNOSIS — K5669 Other intestinal obstruction: Secondary | ICD-10-CM

## 2016-01-29 DIAGNOSIS — G8929 Other chronic pain: Secondary | ICD-10-CM | POA: Diagnosis present

## 2016-01-29 DIAGNOSIS — K56609 Unspecified intestinal obstruction, unspecified as to partial versus complete obstruction: Secondary | ICD-10-CM | POA: Diagnosis present

## 2016-01-29 DIAGNOSIS — D500 Iron deficiency anemia secondary to blood loss (chronic): Secondary | ICD-10-CM | POA: Insufficient documentation

## 2016-01-29 LAB — RAPID URINE DRUG SCREEN, HOSP PERFORMED
Amphetamines: NOT DETECTED
Barbiturates: NOT DETECTED
Benzodiazepines: NOT DETECTED
Cocaine: NOT DETECTED
Opiates: NOT DETECTED
Tetrahydrocannabinol: NOT DETECTED

## 2016-01-29 LAB — URINALYSIS, ROUTINE W REFLEX MICROSCOPIC
Bilirubin Urine: NEGATIVE
Glucose, UA: NEGATIVE mg/dL
Ketones, ur: 40 mg/dL — AB
Leukocytes, UA: NEGATIVE
Nitrite: NEGATIVE
Protein, ur: NEGATIVE mg/dL
Specific Gravity, Urine: 1.019 (ref 1.005–1.030)
pH: 6 (ref 5.0–8.0)

## 2016-01-29 LAB — CBC
HCT: 27.3 % — ABNORMAL LOW (ref 36.0–46.0)
Hemoglobin: 8.9 g/dL — ABNORMAL LOW (ref 12.0–15.0)
MCH: 29.1 pg (ref 26.0–34.0)
MCHC: 32.6 g/dL (ref 30.0–36.0)
MCV: 89.2 fL (ref 78.0–100.0)
Platelets: 280 10*3/uL (ref 150–400)
RBC: 3.06 MIL/uL — ABNORMAL LOW (ref 3.87–5.11)
RDW: 16.4 % — ABNORMAL HIGH (ref 11.5–15.5)
WBC: 7.3 10*3/uL (ref 4.0–10.5)

## 2016-01-29 LAB — BASIC METABOLIC PANEL
Anion gap: 8 (ref 5–15)
BUN: <5 mg/dL — ABNORMAL LOW (ref 6–20)
CO2: 23 mmol/L (ref 22–32)
Calcium: 8.5 mg/dL — ABNORMAL LOW (ref 8.9–10.3)
Chloride: 109 mmol/L (ref 101–111)
Creatinine, Ser: 0.54 mg/dL (ref 0.44–1.00)
GFR calc Af Amer: >60 mL/min (ref >60)
GFR calc non Af Amer: >60 mL/min (ref >60)
Glucose, Bld: 103 mg/dL — ABNORMAL HIGH (ref 65–99)
Potassium: 3.5 mmol/L (ref 3.5–5.1)
Sodium: 140 mmol/L (ref 135–145)

## 2016-01-29 LAB — MAGNESIUM: Magnesium: 1.6 mg/dL — ABNORMAL LOW (ref 1.7–2.4)

## 2016-01-29 LAB — WET PREP, GENITAL
Sperm: NONE SEEN
Trich, Wet Prep: NONE SEEN
Yeast Wet Prep HPF POC: NONE SEEN

## 2016-01-29 LAB — URINE MICROSCOPIC-ADD ON

## 2016-01-29 LAB — CBG MONITORING, ED: Glucose-Capillary: 130 mg/dL — ABNORMAL HIGH (ref 65–99)

## 2016-01-29 LAB — I-STAT BETA HCG BLOOD, ED (MC, WL, AP ONLY): I-stat hCG, quantitative: <5 m[IU]/mL (ref <5)

## 2016-01-29 MED ORDER — ONDANSETRON HCL 4 MG/2ML IJ SOLN
4.0000 mg | Freq: Once | INTRAMUSCULAR | Status: AC
  Administered 2016-01-29: 4 mg via INTRAVENOUS
  Filled 2016-01-29: qty 2

## 2016-01-29 MED ORDER — PROMETHAZINE HCL 25 MG/ML IJ SOLN
12.5000 mg | Freq: Once | INTRAMUSCULAR | Status: AC
  Administered 2016-01-29: 12.5 mg via INTRAVENOUS
  Filled 2016-01-29 (×2): qty 1

## 2016-01-29 MED ORDER — DICYCLOMINE HCL 10 MG PO CAPS
10.0000 mg | ORAL_CAPSULE | Freq: Once | ORAL | Status: AC
  Administered 2016-01-29: 10 mg via ORAL
  Filled 2016-01-29: qty 1

## 2016-01-29 MED ORDER — ONDANSETRON HCL 4 MG/2ML IJ SOLN
4.0000 mg | Freq: Four times a day (QID) | INTRAMUSCULAR | Status: DC | PRN
  Administered 2016-01-30 – 2016-02-05 (×6): 4 mg via INTRAVENOUS
  Filled 2016-01-29 (×7): qty 2

## 2016-01-29 MED ORDER — PROCHLORPERAZINE EDISYLATE 5 MG/ML IJ SOLN
10.0000 mg | Freq: Four times a day (QID) | INTRAMUSCULAR | Status: DC | PRN
  Administered 2016-01-31 – 2016-02-04 (×3): 10 mg via INTRAVENOUS
  Filled 2016-01-29 (×3): qty 2

## 2016-01-29 MED ORDER — METRONIDAZOLE IN NACL 5-0.79 MG/ML-% IV SOLN
500.0000 mg | Freq: Three times a day (TID) | INTRAVENOUS | Status: DC
  Administered 2016-01-29 – 2016-02-03 (×17): 500 mg via INTRAVENOUS
  Filled 2016-01-29 (×19): qty 100

## 2016-01-29 MED ORDER — LIDOCAINE HCL 2 % EX GEL
1.0000 "application " | Freq: Once | CUTANEOUS | Status: AC
  Administered 2016-01-29: 1 via TOPICAL
  Filled 2016-01-29: qty 11

## 2016-01-29 MED ORDER — MORPHINE SULFATE (PF) 4 MG/ML IV SOLN
4.0000 mg | Freq: Once | INTRAVENOUS | Status: AC
Start: 2016-01-29 — End: 2016-01-29
  Administered 2016-01-29: 4 mg via INTRAVENOUS
  Filled 2016-01-29: qty 1

## 2016-01-29 MED ORDER — MAGNESIUM SULFATE 4 GM/100ML IV SOLN
4.0000 g | Freq: Once | INTRAVENOUS | Status: AC
  Administered 2016-01-29: 4 g via INTRAVENOUS
  Filled 2016-01-29: qty 100

## 2016-01-29 MED ORDER — DIATRIZOATE MEGLUMINE & SODIUM 66-10 % PO SOLN
90.0000 mL | Freq: Once | ORAL | Status: AC
  Administered 2016-01-29: 90 mL via NASOGASTRIC
  Filled 2016-01-29: qty 90

## 2016-01-29 MED ORDER — KETOROLAC TROMETHAMINE 30 MG/ML IJ SOLN
30.0000 mg | Freq: Four times a day (QID) | INTRAMUSCULAR | Status: DC | PRN
  Administered 2016-01-29 – 2016-01-30 (×5): 30 mg via INTRAVENOUS
  Filled 2016-01-29 (×5): qty 1

## 2016-01-29 MED ORDER — MORPHINE SULFATE (PF) 2 MG/ML IV SOLN
2.0000 mg | INTRAVENOUS | Status: AC
  Administered 2016-01-29: 2 mg via INTRAVENOUS
  Filled 2016-01-29: qty 1

## 2016-01-29 MED ORDER — POTASSIUM CHLORIDE 10 MEQ/100ML IV SOLN
10.0000 meq | INTRAVENOUS | Status: AC
  Administered 2016-01-29 – 2016-01-30 (×5): 10 meq via INTRAVENOUS
  Filled 2016-01-29 (×5): qty 100

## 2016-01-29 MED ORDER — CIPROFLOXACIN IN D5W 400 MG/200ML IV SOLN
400.0000 mg | Freq: Two times a day (BID) | INTRAVENOUS | Status: DC
  Administered 2016-01-29 – 2016-02-03 (×11): 400 mg via INTRAVENOUS
  Filled 2016-01-29 (×13): qty 200

## 2016-01-29 MED ORDER — IOHEXOL 300 MG/ML  SOLN
100.0000 mL | Freq: Once | INTRAMUSCULAR | Status: AC | PRN
  Administered 2016-01-29: 100 mL via INTRAVENOUS

## 2016-01-29 MED ORDER — SODIUM CHLORIDE 0.9 % IV SOLN
INTRAVENOUS | Status: DC
  Administered 2016-01-29: 08:00:00 via INTRAVENOUS

## 2016-01-29 MED ORDER — IOHEXOL 300 MG/ML  SOLN
25.0000 mL | Freq: Once | INTRAMUSCULAR | Status: AC | PRN
  Administered 2016-01-29: 25 mL via ORAL

## 2016-01-29 MED ORDER — ENOXAPARIN SODIUM 40 MG/0.4ML ~~LOC~~ SOLN
40.0000 mg | SUBCUTANEOUS | Status: DC
  Administered 2016-01-29 – 2016-01-31 (×3): 40 mg via SUBCUTANEOUS
  Filled 2016-01-29 (×8): qty 0.4

## 2016-01-29 MED ORDER — SODIUM CHLORIDE 0.9 % IV SOLN
INTRAVENOUS | Status: DC
  Administered 2016-01-29 – 2016-02-01 (×7): via INTRAVENOUS
  Filled 2016-01-29 (×9): qty 1000

## 2016-01-29 MED ORDER — MORPHINE SULFATE (PF) 2 MG/ML IV SOLN
2.0000 mg | INTRAVENOUS | Status: DC | PRN
  Administered 2016-01-29 – 2016-01-30 (×8): 2 mg via INTRAVENOUS
  Filled 2016-01-29 (×8): qty 1

## 2016-01-29 NOTE — ED Provider Notes (Signed)
4:36 AM Patient care assumed from Egan, PA-C at shift change. Patient pending CT to evaluate for obstruction. CT results reviewed which are suspicion for SBO. Of note, patient does also have a hx of abdominal pain with multiple ED visits. Her pain which brought her to the ED today has been ongoing x 2 weeks. No hx of abdominal surgeries other than C-section x 4. There is mild TTP on abdominal exam with mild distension. TTP is most focal in the RLQ. NGT ordered. Will consult with general surgery and, likely, [facility] for admission.  4:47 AM Case discussed with Dr. Barry Dienes who recommends admission to medical service. Surgery to consult. Will consult [facility].  5:02 AM Case discussed between Dr. Tamala Julian of [facility] and Dr. Betsey Holiday. Will admit to Med-Surg bed.   Filed Vitals:   01/29/16 0245 01/29/16 0300 01/29/16 0301 01/29/16 0330  BP:  141/85  138/83  Pulse: 79 74 86 84  Temp:      TempSrc:      Resp: 21 24 19 20   SpO2: 99% 100% 100% 98%    Ct Abdomen Pelvis W Contrast  01/29/2016  CLINICAL DATA:  Lower abdominal pain, nausea and vomiting, and diarrhea for 2 weeks. EXAM: CT ABDOMEN AND PELVIS WITH CONTRAST TECHNIQUE: Multidetector CT imaging of the abdomen and pelvis was performed using the standard protocol following bolus administration of intravenous contrast. CONTRAST:  169mL OMNIPAQUE IOHEXOL 300 MG/ML  SOLN COMPARISON:  01/08/2016 FINDINGS: Mild dependent changes in the lung bases. The liver, spleen, gallbladder, pancreas, adrenal glands, abdominal aorta, inferior vena cava, and retroperitoneal lymph nodes are unremarkable. Cyst in the right kidney. Renal nephrograms are symmetrical. No hydronephrosis. Stomach is decompressed. Dilated fluid-filled small bowel with decompressed terminal ileum. Transition zone is difficult to localize but appears to be in the left lower abdomen. Cause is indeterminate. The colon is decompressed but there is evidence of edema and stranding in the fat around the colon which  may indicate changes of colitis. No free air in the abdomen. No pneumatosis or portal venous gas. Pelvis: The uterus is enlarged suggesting recent postpartum state. Anterior defect in the lower uterine wall suggesting a C-section scar. Small amount of free fluid in the pelvis is likely reactive. Bladder wall is mildly thickened suggesting possible cystitis. Mild degenerative changes in the spine. No destructive bone lesions. IMPRESSION: Dilated fluid-filled small bowel with decompressed distal small bowel and transition zone probably in the left abdomen. Changes are consistent with small bowel obstruction of nonspecific etiology. Colon is decompressed but there is evidence of fluid and infiltration around the colon suggesting colitis. Small amount of free fluid in the pelvis is likely reactive. Bladder wall thickening suggests cystitis. Presumed recent postpartum changes in the uterus consistent with C-section. Electronically Signed   By: Lucienne Capers M.D.   On: 01/29/2016 04:08   Dg Abd 2 Views  01/29/2016  CLINICAL DATA:  Lower abdominal pain, nausea, vomiting and diarrhea for 2 days. EXAM: ABDOMEN - 2 VIEW COMPARISON:  Abdominal radiographs January 12, 2016 FINDINGS: Scattered mildly gas distended small bowel air-fluid levels, paucity of large bowel gas. No intra-abdominal mass effect or pathologic calcification. Phleboliths LEFT pelvis. Lung bases are clear. Lower thoracic dextroscoliosis. IMPRESSION: Small bowel air-fluid levels, paucity of large bowel gas concerning for small bowel obstruction. Electronically Signed   By: Elon Alas M.D.   On: 01/29/2016 01:42        Antonietta Breach, PA-C 01/29/16 0502  Orpah Greek, MD 01/29/16 [phone]

## 2016-01-29 NOTE — ED Notes (Signed)
Patient transported to X-ray 

## 2016-01-29 NOTE — ED Notes (Signed)
Due to patient being lethargic, CBG completed

## 2016-01-29 NOTE — ED Notes (Signed)
Pt in CT.

## 2016-01-29 NOTE — ED Notes (Addendum)
Patient requesting pain medication. Patient is very lethargic. PA aware. PA denies pain medication at this time. Patient stating she is vomiting "everything up." Stating she vomited while in xray. I checked with xray and they stated she did not vomit while in xray. This RN has not noted any vomiting while patient has been in room. Patient unable to void at this time.

## 2016-01-29 NOTE — Progress Notes (Signed)
I have seen and assessed patient and agree with Dr Thompson Caul assessment and plan. Patteint is a 40yo female presenting with abdominal pain and found to have SBO and colitis. Pain management, bowel rest, IVF, electrolyte replacement, pain management, supportive care. General surgery ff.

## 2016-01-29 NOTE — Progress Notes (Signed)
Iodine administered through NG, radiology notified time was at 10:19am.

## 2016-01-29 NOTE — Progress Notes (Signed)
Patients potassium rescheduled due to magnesium infusing. Both ordered for same time, magnesium started at 5:00 and to be infused over 2 hours. Will pass on to night shift.

## 2016-01-29 NOTE — Consult Note (Signed)
Reason for Consult: bowel obstruction Referring Physician: Dr. Aldona Lento [name] is an 40 y.o. female.  HPI: 40 yo female with 1 day of abdominal pain, nausea and vomiting. The pain is diffuse throughout her whole abdomen, it does not radiate, it is colicky and constant. The pain medications have helped this some. She has not had a bowel movement in a few days but did have flatus yesterday. She has never had similar episode.  Past Medical History  Diagnosis Date  . Heart murmur   . Fibroids   . Anemia     Past Surgical History  Procedure Laterality Date  . Cesarean section    . Tubal ligation    . Ankle surgery      Family History  Problem Relation Age of Onset  . Diabetes Maternal Grandmother   . Hypertension Maternal Aunt     Social History:  reports that she has been smoking Cigarettes.  She has been smoking about 0.50 packs per day. She has never used smokeless tobacco. She reports that she does not drink alcohol or use illicit drugs.  Allergies: No Known Allergies  Medications: I have reviewed the patient's current medications.  Results for orders placed or performed during the hospital encounter of 01/28/16 (from the past 48 hour(s))  Lipase, blood     Status: None   Collection Time: 01/28/16  7:44 PM  Result Value Ref Range   Lipase 12 11 - 51 U/L  Comprehensive metabolic panel     Status: Abnormal   Collection Time: 01/28/16  7:44 PM  Result Value Ref Range   Sodium 138 135 - 145 mmol/L   Potassium 3.1 (L) 3.5 - 5.1 mmol/L   Chloride 105 101 - 111 mmol/L   CO2 23 22 - 32 mmol/L   Glucose, Bld 126 (H) 65 - 99 mg/dL   BUN <5 (L) 6 - 20 mg/dL   Creatinine, Ser 0.67 0.44 - 1.00 mg/dL   Calcium 8.9 8.9 - 10.3 mg/dL   Total Protein 7.3 6.5 - 8.1 g/dL   Albumin 3.9 3.5 - 5.0 g/dL   AST 12 (L) 15 - 41 U/L   ALT 10 (L) 14 - 54 U/L   Alkaline Phosphatase 69 38 - 126 U/L   Total Bilirubin 0.5 0.3 - 1.2 mg/dL   GFR calc non Af Amer >60 >60 mL/min   GFR calc  Af Amer >60 >60 mL/min    Comment: (NOTE) The eGFR has been calculated using the CKD EPI equation. This calculation has not been validated in all clinical situations. eGFR's persistently <60 mL/min signify possible Chronic Kidney Disease.    Anion gap 10 5 - 15  CBC     Status: Abnormal   Collection Time: 01/28/16  7:44 PM  Result Value Ref Range   WBC 11.1 (H) 4.0 - 10.5 K/uL   RBC 3.44 (L) 3.87 - 5.11 MIL/uL   Hemoglobin 10.2 (L) 12.0 - 15.0 g/dL   HCT 30.7 (L) 36.0 - 46.0 %   MCV 89.2 78.0 - 100.0 fL   MCH 29.7 26.0 - 34.0 pg   MCHC 33.2 30.0 - 36.0 g/dL   RDW 16.1 (H) 11.5 - 15.5 %   Platelets 317 150 - 400 K/uL  Differential     Status: Abnormal   Collection Time: 01/28/16  7:44 PM  Result Value Ref Range   Neutrophils Relative % 87 %   Neutro Abs 8.4 (H) 1.7 - 7.7 K/uL   Lymphocytes Relative  11 %   Lymphs Abs 1.1 0.7 - 4.0 K/uL   Monocytes Relative 2 %   Monocytes Absolute 0.2 0.1 - 1.0 K/uL   Eosinophils Relative 0 %   Eosinophils Absolute 0.0 0.0 - 0.7 K/uL   Basophils Relative 0 %   Basophils Absolute 0.0 0.0 - 0.1 K/uL  I-Stat CG4 Lactic Acid, ED     Status: None   Collection Time: 01/28/16 11:16 PM  Result Value Ref Range   Lactic Acid, Venous 0.86 0.5 - 2.0 mmol/L  Wet prep, genital     Status: Abnormal   Collection Time: 01/29/16 12:11 AM  Result Value Ref Range   Yeast Wet Prep HPF POC NONE SEEN NONE SEEN   Trich, Wet Prep NONE SEEN NONE SEEN   Clue Cells Wet Prep HPF POC PRESENT (A) NONE SEEN   WBC, Wet Prep HPF POC MANY (A) NONE SEEN   Sperm NONE SEEN   I-Stat Beta hCG blood, ED ([facility], [facility], [facility] only)     Status: None   Collection Time: 01/29/16 12:58 AM  Result Value Ref Range   I-stat hCG, quantitative <5.0 <5 mIU/mL   Comment 3            Comment:   GEST. AGE      CONC.  (mIU/mL)   <=1 WEEK        5 - 50     2 WEEKS       50 - 500     3 WEEKS       100 - 10,000     4 WEEKS     1,000 - 30,000        FEMALE AND NON-PREGNANT FEMALE:     LESS THAN 5  mIU/mL   CBG monitoring, ED     Status: Abnormal   Collection Time: 01/29/16  2:22 AM  Result Value Ref Range   Glucose-Capillary 130 (H) 65 - 99 mg/dL  Urinalysis, Routine w reflex microscopic (not at [facility])     Status: Abnormal   Collection Time: 01/29/16  2:41 AM  Result Value Ref Range   Color, Urine YELLOW YELLOW   APPearance CLOUDY (A) CLEAR   Specific Gravity, Urine 1.019 1.005 - 1.030   pH 6.0 5.0 - 8.0   Glucose, UA NEGATIVE NEGATIVE mg/dL   Hgb urine dipstick SMALL (A) NEGATIVE   Bilirubin Urine NEGATIVE NEGATIVE   Ketones, ur 40 (A) NEGATIVE mg/dL   Protein, ur NEGATIVE NEGATIVE mg/dL   Nitrite NEGATIVE NEGATIVE   Leukocytes, UA NEGATIVE NEGATIVE  Urine rapid drug screen (hosp performed)     Status: None   Collection Time: 01/29/16  2:41 AM  Result Value Ref Range   Opiates NONE DETECTED NONE DETECTED   Cocaine NONE DETECTED NONE DETECTED   Benzodiazepines NONE DETECTED NONE DETECTED   Amphetamines NONE DETECTED NONE DETECTED   Tetrahydrocannabinol NONE DETECTED NONE DETECTED   Barbiturates NONE DETECTED NONE DETECTED    Comment:        DRUG SCREEN FOR MEDICAL PURPOSES ONLY.  IF CONFIRMATION IS NEEDED FOR ANY PURPOSE, NOTIFY LAB WITHIN 5 DAYS.        LOWEST DETECTABLE LIMITS FOR URINE DRUG SCREEN Drug Class       Cutoff (ng/mL) Amphetamine      1000 Barbiturate      200 Benzodiazepine   017 Tricyclics       510 Opiates          300 Cocaine  300 THC              50   Urine microscopic-add on     Status: Abnormal   Collection Time: 01/29/16  2:41 AM  Result Value Ref Range   Squamous Epithelial / LPF 0-5 (A) NONE SEEN   WBC, UA 0-5 0 - 5 WBC/hpf   RBC / HPF 0-5 0 - 5 RBC/hpf   Bacteria, UA FEW (A) NONE SEEN   Casts HYALINE CASTS (A) NEGATIVE   Urine-Other MUCOUS PRESENT   Basic metabolic panel     Status: Abnormal   Collection Time: 01/29/16  8:25 AM  Result Value Ref Range   Sodium 140 135 - 145 mmol/L   Potassium 3.5 3.5 - 5.1 mmol/L    Chloride 109 101 - 111 mmol/L   CO2 23 22 - 32 mmol/L   Glucose, Bld 103 (H) 65 - 99 mg/dL   BUN <5 (L) 6 - 20 mg/dL   Creatinine, Ser 0.54 0.44 - 1.00 mg/dL   Calcium 8.5 (L) 8.9 - 10.3 mg/dL   GFR calc non Af Amer >60 >60 mL/min   GFR calc Af Amer >60 >60 mL/min    Comment: (NOTE) The eGFR has been calculated using the CKD EPI equation. This calculation has not been validated in all clinical situations. eGFR's persistently <60 mL/min signify possible Chronic Kidney Disease.    Anion gap 8 5 - 15  CBC     Status: Abnormal   Collection Time: 01/29/16  8:25 AM  Result Value Ref Range   WBC 7.3 4.0 - 10.5 K/uL   RBC 3.06 (L) 3.87 - 5.11 MIL/uL   Hemoglobin 8.9 (L) 12.0 - 15.0 g/dL   HCT 27.3 (L) 36.0 - 46.0 %   MCV 89.2 78.0 - 100.0 fL   MCH 29.1 26.0 - 34.0 pg   MCHC 32.6 30.0 - 36.0 g/dL   RDW 16.4 (H) 11.5 - 15.5 %   Platelets 280 150 - 400 K/uL  Magnesium     Status: Abnormal   Collection Time: 01/29/16  8:25 AM  Result Value Ref Range   Magnesium 1.6 (L) 1.7 - 2.4 mg/dL    Ct Abdomen Pelvis W Contrast  01/29/2016  CLINICAL DATA:  Lower abdominal pain, nausea and vomiting, and diarrhea for 2 weeks. EXAM: CT ABDOMEN AND PELVIS WITH CONTRAST TECHNIQUE: Multidetector CT imaging of the abdomen and pelvis was performed using the standard protocol following bolus administration of intravenous contrast. CONTRAST:  124m OMNIPAQUE IOHEXOL 300 MG/ML  SOLN COMPARISON:  01/08/2016 FINDINGS: Mild dependent changes in the lung bases. The liver, spleen, gallbladder, pancreas, adrenal glands, abdominal aorta, inferior vena cava, and retroperitoneal lymph nodes are unremarkable. Cyst in the right kidney. Renal nephrograms are symmetrical. No hydronephrosis. Stomach is decompressed. Dilated fluid-filled small bowel with decompressed terminal ileum. Transition zone is difficult to localize but appears to be in the left lower abdomen. Cause is indeterminate. The colon is decompressed but there is  evidence of edema and stranding in the fat around the colon which may indicate changes of colitis. No free air in the abdomen. No pneumatosis or portal venous gas. Pelvis: The uterus is enlarged suggesting recent postpartum state. Anterior defect in the lower uterine wall suggesting a C-section scar. Small amount of free fluid in the pelvis is likely reactive. Bladder wall is mildly thickened suggesting possible cystitis. Mild degenerative changes in the spine. No destructive bone lesions. IMPRESSION: Dilated fluid-filled small bowel with decompressed distal small bowel and  transition zone probably in the left abdomen. Changes are consistent with small bowel obstruction of nonspecific etiology. Colon is decompressed but there is evidence of fluid and infiltration around the colon suggesting colitis. Small amount of free fluid in the pelvis is likely reactive. Bladder wall thickening suggests cystitis. Presumed recent postpartum changes in the uterus consistent with C-section. Electronically Signed   By: Lucienne Capers M.D.   On: 01/29/2016 04:08   Dg Abd 2 Views  01/29/2016  CLINICAL DATA:  Lower abdominal pain, nausea, vomiting and diarrhea for 2 days. EXAM: ABDOMEN - 2 VIEW COMPARISON:  Abdominal radiographs January 12, 2016 FINDINGS: Scattered mildly gas distended small bowel air-fluid levels, paucity of large bowel gas. No intra-abdominal mass effect or pathologic calcification. Phleboliths LEFT pelvis. Lung bases are clear. Lower thoracic dextroscoliosis. IMPRESSION: Small bowel air-fluid levels, paucity of large bowel gas concerning for small bowel obstruction. Electronically Signed   By: Elon Alas M.D.   On: 01/29/2016 01:42    Review of Systems  Constitutional: Negative for fever and chills.  HENT: Negative for hearing loss.   Eyes: Negative for blurred vision and double vision.  Respiratory: Negative for cough and hemoptysis.   Cardiovascular: Negative for chest pain and palpitations.   Gastrointestinal: Positive for nausea, vomiting and abdominal pain. Negative for diarrhea and constipation.  Genitourinary: Negative for dysuria and urgency.  Musculoskeletal: Negative for myalgias and neck pain.  Skin: Negative for itching and rash.  Neurological: Negative for dizziness, tingling and headaches.  Endo/Heme/Allergies: Does not bruise/bleed easily.  Psychiatric/Behavioral: Negative for depression and suicidal ideas.   Blood pressure 128/74, pulse 71, temperature 98.8 F (37.1 C), temperature source Oral, resp. rate 18, height 5' 4"  (1.626 m), weight 75.705 kg (166 lb 14.4 oz), last menstrual period 12/26/2015, SpO2 100 %. Physical Exam  Vitals reviewed. Constitutional: She is oriented to person, place, and time. She appears well-developed and well-nourished.  HENT:  Head: Normocephalic and atraumatic.  Eyes: Conjunctivae and EOM are normal. Pupils are equal, round, and reactive to light.  Neck: Normal range of motion. Neck supple.  Cardiovascular: Normal rate and regular rhythm.   Respiratory: Effort normal and breath sounds normal.  GI: Soft. Bowel sounds are normal. She exhibits distension. There is tenderness. There is no rebound and no guarding.  NG in place with clear fluid draining  Musculoskeletal: Normal range of motion.  Neurological: She is oriented to person, place, and time.  Somnolent but easily arousable  Skin: Skin is warm and dry.  Psychiatric: She has a normal mood and affect. Her behavior is normal.    Assessment/Plan: 40 yo female with what appears to be pSBO. Currently, the NG tube is draining a small amount of non-bilious fluid.  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   -With the decrease in WBC from yesterday as well as her current exam, this does not appear congruent with appendicitis as the infectious etiology.  [name] 01/29/2016, 9:54 AM

## 2016-01-29 NOTE — H&P (Signed)
[facility] History and Physical  [name] [id number] DOB: [birth year] DOA: 01/28/2016  Referring physician: ED PCP: [facility]   Chief Complaint: Abdominal pain  HPI:  [name] is a 40 year old female with a past medical history significant for C-section 4, iron deficiency anemia, uterine fibroids;  who presents with complaints of abdominal pain for the last month. The pain is constant and achy in nature. She reports trying laxatives and other remedies without success. Associated symptoms include nausea, vomiting, shortness of breath, constipation, and diarrhea. Denies dysuria,, frequency, chest pain, chills, or fever. This is the patient's fifth evaluation for abdominal pain since 12/29/2015. Previously evaluated and discharged home with previous diagnosis being constipation.  Upon admission patient was evaluated with a abdominal x-ray which showed signs of small bowel obstruction tonight subsequent CT scan of the abdomen revealed also signs of a small bowel obstruction with possible signs of colitis.    Review of Systems  Constitutional: Positive for malaise/fatigue. Negative for fever, chills and weight loss.  HENT: Negative for hearing loss and tinnitus.   Respiratory: Positive for shortness of breath. Negative for cough and wheezing.   Cardiovascular: Negative for palpitations, orthopnea and claudication.  Gastrointestinal: Positive for nausea, vomiting, abdominal pain, diarrhea and constipation. Negative for blood in stool.  Genitourinary: Negative for urgency and frequency.  Musculoskeletal: Negative for falls and neck pain.  Skin: Negative for itching and rash.  Neurological: Positive for weakness. Negative for focal weakness and seizures.  Endo/Heme/Allergies: Negative for environmental allergies. Does not bruise/bleed easily.  Psychiatric/Behavioral: Negative for hallucinations and substance abuse.       Past Medical History  Diagnosis Date  . Heart  murmur   . Fibroids   . Anemia      Past Surgical History  Procedure Laterality Date  . Cesarean section    . Tubal ligation    . Ankle surgery        Social History:  reports that she has been smoking Cigarettes.  She has been smoking about 0.50 packs per day. She has never used smokeless tobacco. She reports that she does not drink alcohol or use illicit drugs.  No Known Allergies  Family History  Problem Relation Age of Onset  . Diabetes Maternal Grandmother   . Hypertension Maternal Aunt         Prior to Admission medications   Medication Sig Start Date End Date Taking? Authorizing Provider  bisacodyl (DULCOLAX) 10 MG suppository Place 1 suppository (10 mg total) rectally as needed for moderate constipation. Patient not taking: Reported on 01/28/2016 01/06/16   Luvenia Redden, PA-C  ibuprofen (ADVIL,MOTRIN) 800 MG tablet Take 1 tablet (800 mg total) by mouth 3 (three) times daily. Patient not taking: Reported on 01/28/2016 12/29/15   Courteney Lyn Mackuen, MD  levofloxacin (LEVAQUIN) 750 MG tablet Take 1 tablet (750 mg total) by mouth daily. Patient not taking: Reported on 01/28/2016 01/12/16   Oswald Hillock, MD  metroNIDAZOLE (FLAGYL) 500 MG tablet Take 1 tablet (500 mg total) by mouth 3 (three) times daily. Patient not taking: Reported on 01/28/2016 01/12/16   Oswald Hillock, MD  polyethylene glycol powder [facility]) powder 1 capful daily as needed for constipation Patient not taking: Reported on 01/28/2016 01/06/16   Luvenia Redden, PA-C  saccharomyces boulardii (FLORASTOR) 250 MG capsule Take 1 capsule (250 mg total) by mouth 2 (two) times daily. Patient not taking: Reported on 01/28/2016 01/12/16   Oswald Hillock, MD  Physical Exam: Filed Vitals:   01/29/16 0300 01/29/16 0301 01/29/16 0330 01/29/16 0430  BP: 141/85  138/83 122/70  Pulse: 74 86 84 92  Temp:      TempSrc:      Resp: 24 19 20 18   SpO2: 100% 100% 98% 96%     Constitutional: Vital signs reviewed.  Patient is a mild distress appears uncomfortable lying in hospital bed. Head: Normocephalic and atraumatic  Ear: TM normal bilaterally  Mouth: no erythema or exudates, MMM  Eyes: PERRL, EOMI, conjunctivae normal, No scleral icterus.  Neck: Supple, Trachea midline normal ROM, No JVD, mass, thyromegaly, or carotid bruit present.  Cardiovascular: RRR, S1 normal, S2 normal, no MRG, pulses symmetric and intact bilaterally  Pulmonary/Chest: Decreased aeration with no rhonchi, rales, or wheezing appreciated Abdominal: Distended with generalized tenderness to palpation with hypoactive bowel sounds GU: no CVA tenderness Musculoskeletal: No joint deformities, erythema, or stiffness, ROM full and no nontender Ext: no edema and no cyanosis, pulses palpable bilaterally (DP and PT)  Hematology: no cervical, inginal, or axillary adenopathy.  Neurological: A&O x3, Strenght is normal and symmetric bilaterally, cranial nerve II-XII are grossly intact, no focal motor deficit, sensory intact to light touch bilaterally.  Skin: Warm, dry and intact. No rash, cyanosis, or clubbing.  Psychiatric: Normal mood and affect. speech and behavior is normal. Judgment and thought content normal. Cognition and memory are normal.      Data Review   Micro Results Recent Results (from the past 240 hour(s))  Wet prep, genital     Status: Abnormal   Collection Time: 01/29/16 12:11 AM  Result Value Ref Range Status   Yeast Wet Prep HPF POC NONE SEEN NONE SEEN Final   Trich, Wet Prep NONE SEEN NONE SEEN Final   Clue Cells Wet Prep HPF POC PRESENT (A) NONE SEEN Final   WBC, Wet Prep HPF POC MANY (A) NONE SEEN Final   Sperm NONE SEEN  Final    Radiology Reports Dg Abd 1 View  01/10/2016  CLINICAL DATA:  Abdominal pain, constipation for 4 days EXAM: ABDOMEN - 1 VIEW COMPARISON:  01/09/2016 FINDINGS: Mild gaseous distention of large and small bowel. Decreasing distention of small bowel. Favor mild ileus. No organomegaly or  free air. No suspicious calcification. No acute bony abnormality. IMPRESSION: Improving bowel gas pattern with decreasing gaseous distention of small bowel. Mild gaseous distention of large and small bowel loops likely reflect ileus. Electronically Signed   By: Rolm Baptise M.D.   On: 01/10/2016 07:09   Mr Pelvis W Wo Contrast  01/06/2016  CLINICAL DATA:  Uterine fibroids, pre  uterine artery embolization. EXAM: MRI PELVIS WITHOUT AND WITH CONTRAST TECHNIQUE: Multiplanar multisequence MR imaging of the pelvis was performed both before and after administration of intravenous contrast. CONTRAST:  25mL MULTIHANCE GADOBENATE DIMEGLUMINE 529 MG/ML IV SOLN COMPARISON:  12/29/2015 and 07/14/2015 pelvic ultrasound. CT of 04/30/2015. FINDINGS: Portions of exam are mildly motion degraded. Normal pelvic bowel loops. No pelvic adenopathy. Normal urinary bladder. Both ovaries are normal, including on image 17/ series 5 and image 19/ series 5. Small volume cul-de-sac fluid, slightly greater than typically seen physiologically. Cesarean section defect within the anterior uterine body on image 15/series 6. The endometrium is normal. Given the above described motion, no uterine fibroids are identified. No correlate for the ultrasound abnormality of last August within the uterine fundus. IMPRESSION: 1. No evidence of a uterine fibroids. 2. Motion degraded exam. 3. Small volume pelvic fluid, greater than typically seen  physiologically. This is of indeterminate etiology. Electronically Signed   By: Abigail Miyamoto M.D.   On: 01/06/2016 09:27   Ct Abdomen Pelvis W Contrast  01/29/2016  CLINICAL DATA:  Lower abdominal pain, nausea and vomiting, and diarrhea for 2 weeks. EXAM: CT ABDOMEN AND PELVIS WITH CONTRAST TECHNIQUE: Multidetector CT imaging of the abdomen and pelvis was performed using the standard protocol following bolus administration of intravenous contrast. CONTRAST:  181mL OMNIPAQUE IOHEXOL 300 MG/ML  SOLN COMPARISON:   01/08/2016 FINDINGS: Mild dependent changes in the lung bases. The liver, spleen, gallbladder, pancreas, adrenal glands, abdominal aorta, inferior vena cava, and retroperitoneal lymph nodes are unremarkable. Cyst in the right kidney. Renal nephrograms are symmetrical. No hydronephrosis. Stomach is decompressed. Dilated fluid-filled small bowel with decompressed terminal ileum. Transition zone is difficult to localize but appears to be in the left lower abdomen. Cause is indeterminate. The colon is decompressed but there is evidence of edema and stranding in the fat around the colon which may indicate changes of colitis. No free air in the abdomen. No pneumatosis or portal venous gas. Pelvis: The uterus is enlarged suggesting recent postpartum state. Anterior defect in the lower uterine wall suggesting a C-section scar. Small amount of free fluid in the pelvis is likely reactive. Bladder wall is mildly thickened suggesting possible cystitis. Mild degenerative changes in the spine. No destructive bone lesions. IMPRESSION: Dilated fluid-filled small bowel with decompressed distal small bowel and transition zone probably in the left abdomen. Changes are consistent with small bowel obstruction of nonspecific etiology. Colon is decompressed but there is evidence of fluid and infiltration around the colon suggesting colitis. Small amount of free fluid in the pelvis is likely reactive. Bladder wall thickening suggests cystitis. Presumed recent postpartum changes in the uterus consistent with C-section. Electronically Signed   By: Lucienne Capers M.D.   On: 01/29/2016 04:08   Ct Abdomen Pelvis W Contrast  01/08/2016  CLINICAL DATA:  Abdominal pain and distention. No bowel movement for 1 week. EXAM: CT ABDOMEN AND PELVIS WITH CONTRAST TECHNIQUE: Multidetector CT imaging of the abdomen and pelvis was performed using the standard protocol following bolus administration of intravenous contrast. CONTRAST:  61mL OMNIPAQUE  IOHEXOL 300 MG/ML SOLN, 159mL OMNIPAQUE IOHEXOL 300 MG/ML SOLN COMPARISON:  MRI pelvis 01/06/2016. CT abdomen and pelvis 04/30/2015. FINDINGS: Airspace infiltrative changes in both lung bases, greater on the right. This may represent atelectasis or multifocal pneumonia. The liver, spleen, gallbladder, pancreas, adrenal glands, abdominal aorta, inferior vena cava, and retroperitoneal lymph nodes are unremarkable. Cyst in the anterior right kidney. No hydronephrosis in either kidney. Stomach is decompressed. Small bowel are decompressed. Contrast material flows through to the colon without evidence of bowel obstruction. No free air or free fluid in the abdomen. Colon is diffusely stool-filled. There does appear to be colonic wall thickening involving the transverse colon and hepatic flexure although this could just be due to adherent stool. Colitis is not excluded. Pelvis: Uterus and ovaries are not enlarged. Endometrial stripe is somewhat thickened and fluid-filled. Calcification in the anterior uterus possibly representing small fibroid. No evidence of diverticulitis. No free or loculated pelvic fluid collections. No pelvic mass or lymphadenopathy. Appendix is not identified. IMPRESSION: Diffusely stool-filled colon suggesting constipation. No evidence of bowel obstruction. Possible thickening of the wall of the right colon and hepatic flexure. This may be due to adherent stool but colitis is not excluded. Airspace infiltration in the lung bases suggest pneumonia or less likely atelectasis. Endometrial cavity is prominent and fluid-filled.  Possibly physiologic. Electronically Signed   By: Lucienne Capers M.D.   On: 01/08/2016 02:20   Dg Abd 2 Views  01/29/2016  CLINICAL DATA:  Lower abdominal pain, nausea, vomiting and diarrhea for 2 days. EXAM: ABDOMEN - 2 VIEW COMPARISON:  Abdominal radiographs January 12, 2016 FINDINGS: Scattered mildly gas distended small bowel air-fluid levels, paucity of large bowel gas.  No intra-abdominal mass effect or pathologic calcification. Phleboliths LEFT pelvis. Lung bases are clear. Lower thoracic dextroscoliosis. IMPRESSION: Small bowel air-fluid levels, paucity of large bowel gas concerning for small bowel obstruction. Electronically Signed   By: Elon Alas M.D.   On: 01/29/2016 01:42   Dg Abd 2 Views  01/12/2016  CLINICAL DATA:  Low abdominal pain and constipation for the last week. EXAM: ABDOMEN - 2 VIEW COMPARISON:  01/10/2016 FINDINGS: The bowel gas pattern is nonobstructive. Multiple gas fluid levels are seen within non pathologically distended small and large bowel loops. No radiographic evidence of organomegaly. There is no evidence of free air. No radio-opaque calculi or other significant radiographic abnormality is seen. IMPRESSION: Multiple gas fluid levels within non pathologically distended small and large bowel loops. Findings are suggestive of enteritis/colitis. Electronically Signed   By: Fidela Salisbury M.D.   On: 01/12/2016 07:40   Dg Abd Acute W/chest  01/09/2016  CLINICAL DATA:  40 year old female with right abdominal pain, nausea and vomiting for 2 days. EXAM: DG ABDOMEN ACUTE W/ 1V CHEST COMPARISON:  01/08/2016 CT and prior exams. FINDINGS: The cardiomediastinal silhouette is unremarkable. There is no evidence of airspace disease, pleural effusion or pneumothorax. Now identified are mildly distended loops of small bowel with air-fluid levels. There is no evidence of pneumoperitoneum. Small amount of gas in the colon is noted. IMPRESSION: New mildly distended small bowel loops with air-fluid levels. This may represent an ileus or possibly developing small bowel obstruction. No evidence of pneumoperitoneum. No evidence of acute cardiopulmonary disease. Electronically Signed   By: Margarette Canada M.D.   On: 01/09/2016 13:56     CBC  Recent Labs Lab 01/28/16 1944  WBC 11.1*  HGB 10.2*  HCT 30.7*  PLT 317  MCV 89.2  MCH 29.7  MCHC 33.2  RDW  16.1*  LYMPHSABS 1.1  MONOABS 0.2  EOSABS 0.0  BASOSABS 0.0    Chemistries   Recent Labs Lab 01/28/16 1944  NA 138  K 3.1*  CL 105  CO2 23  GLUCOSE 126*  BUN <5*  CREATININE 0.67  CALCIUM 8.9  AST 12*  ALT 10*  ALKPHOS 69  BILITOT 0.5   ------------------------------------------------------------------------------------------------------------------ CrCl cannot be calculated (Unknown ideal weight.). ------------------------------------------------------------------------------------------------------------------ No results for input(s): HGBA1C in the last 72 hours. ------------------------------------------------------------------------------------------------------------------ No results for input(s): CHOL, HDL, LDLCALC, TRIG, CHOLHDL, LDLDIRECT in the last 72 hours. ------------------------------------------------------------------------------------------------------------------ No results for input(s): TSH, T4TOTAL, T3FREE, THYROIDAB in the last 72 hours.  Invalid input(s): FREET3 ------------------------------------------------------------------------------------------------------------------ No results for input(s): VITAMINB12, FOLATE, FERRITIN, TIBC, IRON, RETICCTPCT in the last 72 hours.  Coagulation profile No results for input(s): INR, PROTIME in the last 168 hours.  No results for input(s): DDIMER in the last 72 hours.  Cardiac Enzymes No results for input(s): CKMB, TROPONINI, MYOGLOBIN in the last 168 hours.  Invalid input(s): CK ------------------------------------------------------------------------------------------------------------------ Invalid input(s): POCBNP   CBG:  Recent Labs Lab 01/29/16 0222  GLUCAP 130*     Assessment/Plan SBO (small bowel obstruction): Patient with complaints of constipation over the last month with intermittent diarrhea for the last 2 weeks. X-ray and CT scan  findings suggest small bowel obstruction. Surgery  consulted and recommended NG tube placement. - Admit to MedSurg bed - NPO - Continue Nasogastric tube placed to suction and bowel rest - IV fluids and normal saline at 100 mL per hour - Follow-up surgery recommendations in a.m.  Abdominal pain, nausea, vomiting  - Toradol/Morphine prn moderate to severe pain respectively - Zofran IV as needed  Possible colitis: As seen on CT scan - IV metronidazole and ciprofloxacin  Anemia: Hemoglobin 10.1 - Continued to monitor   Hypokalemia: Potassium initially 3.1 on admission patient given 40 mg potassium chloride by mouth. - Continue to monitor and replace as needed   Code Status:   full Family Communication: bedside Disposition Plan: admit   Total time spent 55 minutes.Greater than 50% of this time was spent in counseling, explanation of diagnosis, planning of further management, and coordination of care  [facility] Pager [phone]  If 7PM-7AM, please contact night-coverage [email or web address] Password [facility] 01/29/2016, 6:07 AM

## 2016-01-30 ENCOUNTER — Inpatient Hospital Stay (HOSPITAL_COMMUNITY): Payer: Medicaid Other

## 2016-01-30 LAB — MAGNESIUM: Magnesium: 2.1 mg/dL (ref 1.7–2.4)

## 2016-01-30 LAB — BASIC METABOLIC PANEL
Anion gap: 7 (ref 5–15)
BUN: 5 mg/dL — ABNORMAL LOW (ref 6–20)
CO2: 23 mmol/L (ref 22–32)
Calcium: 8.4 mg/dL — ABNORMAL LOW (ref 8.9–10.3)
Chloride: 109 mmol/L (ref 101–111)
Creatinine, Ser: 0.55 mg/dL (ref 0.44–1.00)
GFR calc Af Amer: >60 mL/min (ref >60)
GFR calc non Af Amer: >60 mL/min (ref >60)
Glucose, Bld: 88 mg/dL (ref 65–99)
Potassium: 4.2 mmol/L (ref 3.5–5.1)
Sodium: 139 mmol/L (ref 135–145)

## 2016-01-30 LAB — FERRITIN: Ferritin: 12 ng/mL (ref 11–307)

## 2016-01-30 LAB — IRON AND TIBC
Iron: 25 ug/dL — ABNORMAL LOW (ref 28–170)
Saturation Ratios: 9 % — ABNORMAL LOW (ref 10.4–31.8)
TIBC: 280 ug/dL (ref 250–450)
UIBC: 255 ug/dL

## 2016-01-30 LAB — CBC
HCT: 28.2 % — ABNORMAL LOW (ref 36.0–46.0)
Hemoglobin: 9 g/dL — ABNORMAL LOW (ref 12.0–15.0)
MCH: 29.1 pg (ref 26.0–34.0)
MCHC: 31.9 g/dL (ref 30.0–36.0)
MCV: 91.3 fL (ref 78.0–100.0)
Platelets: 258 10*3/uL (ref 150–400)
RBC: 3.09 MIL/uL — ABNORMAL LOW (ref 3.87–5.11)
RDW: 16.8 % — ABNORMAL HIGH (ref 11.5–15.5)
WBC: 7.3 10*3/uL (ref 4.0–10.5)

## 2016-01-30 LAB — VITAMIN B12: Vitamin B-12: 427 pg/mL (ref 180–914)

## 2016-01-30 LAB — FOLATE: Folate: 8.8 ng/mL (ref >5.9)

## 2016-01-30 MED ORDER — MORPHINE SULFATE (PF) 2 MG/ML IV SOLN
2.0000 mg | INTRAVENOUS | Status: DC | PRN
  Administered 2016-01-30 – 2016-02-02 (×15): 2 mg via INTRAVENOUS
  Filled 2016-01-30 (×15): qty 1

## 2016-01-30 MED ORDER — BISACODYL 10 MG RE SUPP
10.0000 mg | Freq: Once | RECTAL | Status: AC
  Administered 2016-01-30: 10 mg via RECTAL
  Filled 2016-01-30: qty 1

## 2016-01-30 MED ORDER — MENTHOL 3 MG MT LOZG
1.0000 | LOZENGE | OROMUCOSAL | Status: DC | PRN
  Administered 2016-01-30: 3 mg via ORAL
  Filled 2016-01-30: qty 9

## 2016-01-30 MED ORDER — KETOROLAC TROMETHAMINE 30 MG/ML IJ SOLN
30.0000 mg | Freq: Four times a day (QID) | INTRAMUSCULAR | Status: AC
  Administered 2016-01-30 – 2016-01-31 (×4): 30 mg via INTRAVENOUS
  Filled 2016-01-30 (×4): qty 1

## 2016-01-30 NOTE — Progress Notes (Signed)
[facility] PROGRESS NOTE  [name] [id number] DOB: 01/13/1976 DOA: 01/28/2016 PCP: [facility]  Assessment/Plan: 1. Small bowel obstruction -She has a history of severe constipation. To be secondary to narcotic analgesics. -Presented with complaints of abdominal pain, distention that was associated with nausea and vomiting. Further workup included a CT scan of abdomen and pelvis with IV contrast that showed findings suggestive of small bowel obstruction. -She was made nothing by mouth and NG tube placed. -Gen. surgery was consulted and recommended minimizing narcotic suspect this could be exacerbated small bowel obstruction. -Repeat abdominal film performed on 01/30/2016 showing persistent partial small bowel obstruction without significant interval change compared to prior study. -A physical exam bowel sounds are absent. -Gen. surgery recommending scheduled Toradol. 30 milligrams of Toradol scheduled every 6 hours  2.  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 Workup included a CT scan of abdomen and pelvis with IV contrast that revealed dilated fluid-filled small bowel with transition zone and left abdomen, findings suggestive of small bowel obstruction. General surgery was consulted. She was made nothing by mouth with placement of NG tube and started on IV fluids. Gen. surgery recommended minimizing narcotics suspecting that this could be exacerbating small bowel obstruction.    Objective: Filed Vitals:   01/30/16 0930 01/30/16 1300  BP: 135/85 135/66  Pulse: 61 60  Temp: 98.5 F (36.9 C) 98.1 F (36.7 C)  Resp: 17 18    Intake/Output Summary (Last 24 hours) at 01/30/16 1436 Last data filed at 01/30/16 1400  Gross per 24 hour  Intake 1975.83 ml  Output   1450 ml  Net 525.83 ml   Filed Weights   01/29/16 0619  Weight: 75.705 kg (166 lb 14.4 oz)    Exam:   General:  No acute distress awake and alert, reported ongoing abdominal pain  Cardiovascular: Regular rate and rhythm, normal S1-S2  Respiratory: Normal inspiratory effort  Abdomen: Distended, NG tube in place, having generalized tenderness to palpation  Musculoskeletal: No edema  Data Reviewed: Basic Metabolic Panel:  Recent Labs Lab 01/28/16 1944 01/29/16 0825 01/30/16 0449  NA 138 140 139  K 3.1* 3.5 4.2  CL 105 109 109  CO2 23 23 23   GLUCOSE 126* 103* 88  BUN <5* <5* 5*  CREATININE 0.67 0.54 0.55  CALCIUM 8.9 8.5* 8.4*  MG  --  1.6* 2.1   Liver Function Tests:  Recent Labs Lab 01/28/16 1944  AST 12*  ALT 10*  ALKPHOS 69  BILITOT 0.5  PROT 7.3  ALBUMIN 3.9    Recent Labs Lab 01/28/16 1944  LIPASE 12   No results for input(s): AMMONIA in the last 168 hours. CBC:  Recent Labs Lab 01/28/16 1944 01/29/16 0825 01/30/16 0449  WBC 11.1* 7.3 7.3  NEUTROABS 8.4*  --   --   HGB 10.2* 8.9* 9.0*  HCT 30.7* 27.3* 28.2*  MCV 89.2 89.2 91.3  PLT 317 280 258   Cardiac Enzymes: No results for input(s): CKTOTAL, CKMB, CKMBINDEX, TROPONINI in the last 168 hours. BNP (last 3  results) No results for input(s): BNP in the last 8760 hours.  ProBNP (last 3 results) No results for input(s): PROBNP in the last 8760 hours.  CBG:  Recent Labs Lab 01/29/16 0222  GLUCAP 130*    Recent Results (from the past 240 hour(s))  Wet prep, genital     Status: Abnormal   Collection Time: 01/29/16 12:11 AM  Result Value Ref Range Status   Yeast Wet Prep HPF POC NONE  SEEN NONE SEEN Final   Trich, Wet Prep NONE SEEN NONE SEEN Final   Clue Cells Wet Prep HPF POC PRESENT (A) NONE SEEN Final   WBC, Wet Prep HPF POC MANY (A) NONE SEEN Final   Sperm NONE SEEN  Final     Studies: Ct Abdomen Pelvis W Contrast  01/29/2016  CLINICAL DATA:  Lower abdominal pain, nausea and vomiting, and diarrhea for 2 weeks. EXAM: CT ABDOMEN AND PELVIS WITH CONTRAST TECHNIQUE: Multidetector CT imaging of the abdomen and pelvis was performed using the standard protocol following bolus administration of intravenous contrast. CONTRAST:  146mL OMNIPAQUE IOHEXOL 300 MG/ML  SOLN COMPARISON:  01/08/2016 FINDINGS: Mild dependent changes in the lung bases. The liver, spleen, gallbladder, pancreas, adrenal glands, abdominal aorta, inferior vena cava, and retroperitoneal lymph nodes are unremarkable. Cyst in the right kidney. Renal nephrograms are symmetrical. No hydronephrosis. Stomach is decompressed. Dilated fluid-filled small bowel with decompressed terminal ileum. Transition zone is difficult to localize but appears to be in the left lower abdomen. Cause is indeterminate. The colon is decompressed but there is evidence of edema and stranding in the fat around the colon which may indicate changes of colitis. No free air in the abdomen. No pneumatosis or portal venous gas. Pelvis: The uterus is enlarged suggesting recent postpartum state. Anterior defect in the lower uterine wall suggesting a C-section scar. Small amount of free fluid in the pelvis is likely reactive. Bladder wall is mildly thickened suggesting possible cystitis. Mild degenerative changes in the spine. No destructive bone lesions. IMPRESSION: Dilated fluid-filled small bowel with decompressed distal small bowel and transition zone probably in the left abdomen. Changes are consistent with small bowel obstruction of nonspecific etiology. Colon is decompressed but there is evidence of fluid and infiltration around the colon suggesting  colitis. Small amount of free fluid in the pelvis is likely reactive. Bladder wall thickening suggests cystitis. Presumed recent postpartum changes in the uterus consistent with C-section. Electronically Signed   By: Lucienne Capers M.D.   On: 01/29/2016 04:08   Dg Abd 2 Views  01/30/2016  CLINICAL DATA:  40 year old female with lower abdominal pain, nausea and low-grade fever for 1 week. Small bowel obstruction. EXAM: ABDOMEN - 2 VIEW COMPARISON:  Prior abdominal radiograph 01/29/2016 FINDINGS: Nasogastric tube remains in good position. Persistent diffuse dilatation of multiple loops of small bowel with air-fluid levels in the upper abdomen. There has been mild interval distal progression of contrast material into the ascending colon to the level of the hepatic flexure. The lung bases are clear. No evidence of free air. IMPRESSION: 1. Persistent partial small bowel obstruction without significant interval change. 2. Slight continued progression of contrast material into the ascending colon. Electronically Signed   By: Jacqulynn Cadet M.D.   On: 01/30/2016 09:23   Dg Abd 2 Views  01/29/2016  CLINICAL DATA:  Lower abdominal pain, nausea, vomiting and diarrhea for 2 days. EXAM: ABDOMEN - 2 VIEW COMPARISON:  Abdominal radiographs January 12, 2016 FINDINGS: Scattered mildly gas distended small bowel air-fluid levels,  paucity of large bowel gas. No intra-abdominal mass effect or pathologic calcification. Phleboliths LEFT pelvis. Lung bases are clear. Lower thoracic dextroscoliosis. IMPRESSION: Small bowel air-fluid levels, paucity of large bowel gas concerning for small bowel obstruction. Electronically Signed   By: Elon Alas M.D.   On: 01/29/2016 01:42   Dg Abd Portable 1v-small Bowel Obstruction Protocol-initial, 8 Hr Delay  01/29/2016  CLINICAL DATA:  Small bowel obstruction. Oral contrast given at 10:20 this morning via the patient's nasogastric tube. EXAM: PORTABLE ABDOMEN - 1 VIEW COMPARISON:   Abdomen radiographs and abdomen pelvis CT obtained earlier today. FINDINGS: Oral contrast in multiple mildly dilated small bowel loops. There is also oral contrast in normal caliber distal small bowel loops, right colon and hepatic flexure. Excreted contrast in the urinary bladder. Nasogastric tube tip in the distal stomach. Mild scoliosis. IMPRESSION: Stable partial small bowel obstruction. Electronically Signed   By: Claudie Revering M.D.   On: 01/29/2016 18:44    Scheduled Meds: . ciprofloxacin  400 mg Intravenous BID  . enoxaparin (LOVENOX) injection  40 mg Subcutaneous Q24H  . metronidazole  500 mg Intravenous 3 times per day   Continuous Infusions: . sodium chloride 0.9 % 1,000 mL with potassium chloride 40 mEq infusion 125 mL/hr at 01/30/16 0451    Principal Problem:   SBO (small bowel obstruction) (HCC) Active Problems:   Abdominal pain   Nausea with vomiting   Small bowel obstruction (HCC)   Hypokalemia   Anemia    Time spent: 25 min    Kelvin Cellar  [facility] Pager [phone]. If 7PM-7AM, please contact night-coverage at [email or web address], password [facility] 01/30/2016, 2:36 PM  LOS: 1 day

## 2016-01-30 NOTE — Progress Notes (Addendum)
Subjective: Still with pain, asking for stronger pain medication.  No flatus or BM.  Objective: Vital signs in last 24 hours: Temp:  [98.6 F (37 C)-99.5 F (37.5 C)] 98.6 F (37 C) (02/18 0733) Pulse Rate:  [63-85] 63 (02/18 0733) Resp:  [16-20] 20 (02/18 0733) BP: (136-149)/(55-83) 140/55 mmHg (02/18 0733) SpO2:  [98 %-100 %] 99 % (02/18 0733) Last BM Date: 01/28/16  Intake/Output from previous day: 02/17 0701 - 02/18 0700 In: 2105.8 [I.V.:1645.8; NG/GT:60; IV Piggyback:400] Out: 925 [Urine:450; Emesis/NG output:475] Intake/Output this shift:    PE: General- In NAD Abdomen-soft, distended, hypoactive bowel sounds  Lab Results:   Recent Labs  01/29/16 0825 01/30/16 0449  WBC 7.3 7.3  HGB 8.9* 9.0*  HCT 27.3* 28.2*  PLT 280 258   BMET  Recent Labs  01/29/16 0825 01/30/16 0449  NA 140 139  K 3.5 4.2  CL 109 109  CO2 23 23  GLUCOSE 103* 88  BUN <5* 5*  CREATININE 0.54 0.55  CALCIUM 8.5* 8.4*   PT/INR No results for input(s): LABPROT, INR in the last 72 hours. Comprehensive Metabolic Panel:    Component Value Date/Time   NA 139 01/30/2016 0449   NA 140 01/29/2016 0825   K 4.2 01/30/2016 0449   K 3.5 01/29/2016 0825   CL 109 01/30/2016 0449   CL 109 01/29/2016 0825   CO2 23 01/30/2016 0449   CO2 23 01/29/2016 0825   BUN 5* 01/30/2016 0449   BUN <5* 01/29/2016 0825   CREATININE 0.55 01/30/2016 0449   CREATININE 0.54 01/29/2016 0825   GLUCOSE 88 01/30/2016 0449   GLUCOSE 103* 01/29/2016 0825   CALCIUM 8.4* 01/30/2016 0449   CALCIUM 8.5* 01/29/2016 0825   AST 12* 01/28/2016 1944   AST 23 01/09/2016 1356   ALT 10* 01/28/2016 1944   ALT 27 01/09/2016 1356   ALKPHOS 69 01/28/2016 1944   ALKPHOS 77 01/09/2016 1356   BILITOT 0.5 01/28/2016 1944   BILITOT 1.1 01/09/2016 1356   PROT 7.3 01/28/2016 1944   PROT 8.2* 01/09/2016 1356   ALBUMIN 3.9 01/28/2016 1944   ALBUMIN 3.6 01/09/2016 1356     Studies/Results: Ct Abdomen Pelvis W  Contrast  01/29/2016  CLINICAL DATA:  Lower abdominal pain, nausea and vomiting, and diarrhea for 2 weeks. EXAM: CT ABDOMEN AND PELVIS WITH CONTRAST TECHNIQUE: Multidetector CT imaging of the abdomen and pelvis was performed using the standard protocol following bolus administration of intravenous contrast. CONTRAST:  171mL OMNIPAQUE IOHEXOL 300 MG/ML  SOLN COMPARISON:  01/08/2016 FINDINGS: Mild dependent changes in the lung bases. The liver, spleen, gallbladder, pancreas, adrenal glands, abdominal aorta, inferior vena cava, and retroperitoneal lymph nodes are unremarkable. Cyst in the right kidney. Renal nephrograms are symmetrical. No hydronephrosis. Stomach is decompressed. Dilated fluid-filled small bowel with decompressed terminal ileum. Transition zone is difficult to localize but appears to be in the left lower abdomen. Cause is indeterminate. The colon is decompressed but there is evidence of edema and stranding in the fat around the colon which may indicate changes of colitis. No free air in the abdomen. No pneumatosis or portal venous gas. Pelvis: The uterus is enlarged suggesting recent postpartum state. Anterior defect in the lower uterine wall suggesting a C-section scar. Small amount of free fluid in the pelvis is likely reactive. Bladder wall is mildly thickened suggesting possible cystitis. Mild degenerative changes in the spine. No destructive bone lesions. IMPRESSION: Dilated fluid-filled small bowel with decompressed distal small bowel and transition zone probably  in the left abdomen. Changes are consistent with small bowel obstruction of nonspecific etiology. Colon is decompressed but there is evidence of fluid and infiltration around the colon suggesting colitis. Small amount of free fluid in the pelvis is likely reactive. Bladder wall thickening suggests cystitis. Presumed recent postpartum changes in the uterus consistent with C-section. Electronically Signed   By: Lucienne Capers M.D.   On:  01/29/2016 04:08   Dg Abd 2 Views  01/30/2016  CLINICAL DATA:  40 year old female with lower abdominal pain, nausea and low-grade fever for 1 week. Small bowel obstruction. EXAM: ABDOMEN - 2 VIEW COMPARISON:  Prior abdominal radiograph 01/29/2016 FINDINGS: Nasogastric tube remains in good position. Persistent diffuse dilatation of multiple loops of small bowel with air-fluid levels in the upper abdomen. There has been mild interval distal progression of contrast material into the ascending colon to the level of the hepatic flexure. The lung bases are clear. No evidence of free air. IMPRESSION: 1. Persistent partial small bowel obstruction without significant interval change. 2. Slight continued progression of contrast material into the ascending colon. Electronically Signed   By: Jacqulynn Cadet M.D.   On: 01/30/2016 09:23   Dg Abd 2 Views  01/29/2016  CLINICAL DATA:  Lower abdominal pain, nausea, vomiting and diarrhea for 2 days. EXAM: ABDOMEN - 2 VIEW COMPARISON:  Abdominal radiographs January 12, 2016 FINDINGS: Scattered mildly gas distended small bowel air-fluid levels, paucity of large bowel gas. No intra-abdominal mass effect or pathologic calcification. Phleboliths LEFT pelvis. Lung bases are clear. Lower thoracic dextroscoliosis. IMPRESSION: Small bowel air-fluid levels, paucity of large bowel gas concerning for small bowel obstruction. Electronically Signed   By: Elon Alas M.D.   On: 01/29/2016 01:42   Dg Abd Portable 1v-small Bowel Obstruction Protocol-initial, 8 Hr Delay  01/29/2016  CLINICAL DATA:  Small bowel obstruction. Oral contrast given at 10:20 this morning via the patient's nasogastric tube. EXAM: PORTABLE ABDOMEN - 1 VIEW COMPARISON:  Abdomen radiographs and abdomen pelvis CT obtained earlier today. FINDINGS: Oral contrast in multiple mildly dilated small bowel loops. There is also oral contrast in normal caliber distal small bowel loops, right colon and hepatic flexure.  Excreted contrast in the urinary bladder. Nasogastric tube tip in the distal stomach. Mild scoliosis. IMPRESSION: Stable partial small bowel obstruction. Electronically Signed   By: Claudie Revering M.D.   On: 01/29/2016 18:44    Anti-infectives: Anti-infectives    Start     Dose/Rate Route Frequency Ordered Stop   01/29/16 0645  metroNIDAZOLE (FLAGYL) IVPB 500 mg     500 mg 100 mL/hr over 60 Minutes Intravenous 3 times per day 01/29/16 [id number]     01/29/16 0645  ciprofloxacin (CIPRO) IVPB 400 mg     400 mg 200 mL/hr over 60 Minutes Intravenous 2 times daily 01/29/16 [id number]        Assessment Principal Problem:   SBO (small bowel obstruction) (HCC)-functional (narcotic use) vs mechanical; SBO protocol demonstrates contrast passing into right colon; contrast at hepatic flexure on today's x-ray    LOS: 1 day   Plan: Give scheduled Toradol.  Minimize narcotics as they are likely exacerbating this situation.  Repeat x-ray tomorrow.  Ambulate. Dulcolax supp.   Odis Hollingshead 01/30/2016

## 2016-01-31 ENCOUNTER — Inpatient Hospital Stay (HOSPITAL_COMMUNITY): Payer: Medicaid Other

## 2016-01-31 NOTE — Progress Notes (Signed)
Dr Zella Richer notified re info in my previous note. Acknowledged info & said to leave her connected to suction. Shalicia Craghead, [employer]

## 2016-01-31 NOTE — Progress Notes (Signed)
[facility] PROGRESS NOTE  [name] [id number] DOB: 1976-10-07 DOA: 01/28/2016 PCP: [facility]  Assessment/Plan: 1. Small bowel obstruction -She has a history of severe constipation. To be secondary to narcotic analgesics. -Presented with complaints of abdominal pain, distention that was associated with nausea and vomiting. Further workup included a CT scan of abdomen and pelvis with IV contrast that showed findings suggestive of small bowel obstruction. -She was made nothing by mouth and NG tube placed. -Gen. surgery was consulted and recommended minimizing narcotic suspect this could be exacerbated small bowel obstruction. -Repeat abdominal film performed on 01/30/2016 showing persistent partial small bowel obstruction without significant interval change compared to prior study. -Gen. surgery recommending scheduled Toradol. 30 milligrams of Toradol scheduled every 6 hours -On 01/31/2016 she is showing clinical improvement, now having positive bowel sounds. Abdomen less distended. She reported having a BM overnight  2.  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 Workup included a CT scan of abdomen and pelvis with IV contrast that revealed dilated fluid-filled small bowel with transition zone and left abdomen, findings suggestive of small bowel obstruction. General surgery was consulted. She was made nothing by mouth with placement of NG tube and started on IV fluids. Gen. surgery  recommended minimizing narcotics suspecting that this could be exacerbating small bowel obstruction.   Objective: Filed Vitals:   01/30/16 2117 01/31/16 0537  BP: 151/95 139/71  Pulse: 71 85  Temp: 98.5 F (36.9 C) 98.5 F (36.9 C)  Resp: 17 17    Intake/Output Summary (Last 24 hours) at 01/31/16 0746 Last data filed at 01/31/16 0600  Gross per 24 hour  Intake   1760 ml  Output   2475 ml  Net   -715 ml   Filed Weights   01/29/16 0619  Weight: 75.705 kg (166 lb 14.4 oz)    Exam:   General:  No acute distress awake and alert, reported improvement to her pain  Cardiovascular: Regular rate and rhythm, normal S1-S2  Respiratory: Normal inspiratory effort  Abdomen: Less distended. Now having bowel sounds  Musculoskeletal: No edema  Data Reviewed: Basic Metabolic Panel:  Recent Labs Lab 01/28/16 1944 01/29/16 0825 01/30/16 0449  NA 138 140 139  K 3.1* 3.5 4.2  CL 105 109 109  CO2 23 23 23   GLUCOSE 126* 103* 88  BUN <5* <5* 5*  CREATININE 0.67 0.54 0.55  CALCIUM 8.9 8.5* 8.4*  MG  --  1.6* 2.1   Liver Function Tests:  Recent Labs Lab 01/28/16 1944  AST 12*  ALT 10*  ALKPHOS 69  BILITOT 0.5  PROT 7.3  ALBUMIN 3.9    Recent Labs Lab 01/28/16 1944  LIPASE 12   No results for input(s): AMMONIA in the last 168 hours. CBC:  Recent Labs Lab 01/28/16 1944 01/29/16 0825 01/30/16 0449  WBC 11.1* 7.3 7.3  NEUTROABS 8.4*  --   --   HGB 10.2* 8.9* 9.0*  HCT 30.7* 27.3* 28.2*  MCV 89.2 89.2 91.3  PLT 317 280 258   Cardiac Enzymes: No  results for input(s): CKTOTAL, CKMB, CKMBINDEX, TROPONINI in the last 168 hours. BNP (last 3 results) No results for input(s): BNP in the last 8760 hours.  ProBNP (last 3 results) No results for input(s): PROBNP in the last 8760 hours.  CBG:  Recent Labs Lab 01/29/16 0222  GLUCAP 130*    Recent Results (from the past 240 hour(s))  Wet prep, genital     Status: Abnormal   Collection Time: 01/29/16 12:11  AM  Result Value Ref Range Status   Yeast Wet Prep HPF POC NONE SEEN NONE SEEN Final   Trich, Wet Prep NONE SEEN NONE SEEN Final   Clue Cells Wet Prep HPF POC PRESENT (A) NONE SEEN Final   WBC, Wet Prep HPF POC MANY (A) NONE SEEN Final   Sperm NONE SEEN  Final     Studies: Dg Abd 2 Views  01/30/2016  CLINICAL DATA:  40 year old female with lower abdominal pain, nausea and low-grade fever for 1 week. Small bowel obstruction. EXAM: ABDOMEN - 2 VIEW COMPARISON:  Prior abdominal radiograph 01/29/2016 FINDINGS: Nasogastric tube remains in good position. Persistent diffuse dilatation of multiple loops of small bowel with air-fluid levels in the upper abdomen. There has been mild interval distal progression of contrast material into the ascending colon to the level of the hepatic flexure. The lung bases are clear. No evidence of free air. IMPRESSION: 1. Persistent partial small bowel obstruction without significant interval change. 2. Slight continued progression of contrast material into the ascending colon. Electronically Signed   By: Jacqulynn Cadet M.D.   On: 01/30/2016 09:23   Dg Abd Portable 1v-small Bowel Obstruction Protocol-initial, 8 Hr Delay  01/29/2016  CLINICAL DATA:  Small bowel obstruction. Oral contrast given at 10:20 this morning via the patient's nasogastric tube. EXAM: PORTABLE ABDOMEN - 1 VIEW COMPARISON:  Abdomen radiographs and abdomen pelvis CT obtained earlier today. FINDINGS: Oral contrast in multiple mildly dilated small bowel loops. There is also oral contrast in normal caliber distal small bowel loops, right colon and hepatic flexure. Excreted contrast in the urinary bladder. Nasogastric tube tip in the distal stomach. Mild scoliosis. IMPRESSION: Stable partial small bowel obstruction. Electronically Signed   By: Claudie Revering M.D.   On: 01/29/2016 18:44    Scheduled Meds: . ciprofloxacin  400 mg Intravenous BID  . enoxaparin (LOVENOX) injection  40 mg Subcutaneous Q24H  .  ketorolac  30 mg Intravenous 4 times per day  . metronidazole  500 mg Intravenous 3 times per day   Continuous Infusions: . sodium chloride 0.9 % 1,000 mL with potassium chloride 40 mEq infusion 125 mL/hr at 01/31/16 0231    Principal Problem:   SBO (small bowel obstruction) (HCC) Active Problems:   Abdominal pain   Nausea with vomiting   Small bowel obstruction (HCC)   Hypokalemia   Anemia    Time spent: 25 min    Kelvin Cellar  [facility] Pager [phone]. If 7PM-7AM, please contact night-coverage at [email or web address], password [facility] 01/31/2016, 7:46 AM  LOS: 2 days

## 2016-01-31 NOTE — Progress Notes (Signed)
Suction resumed to NGT due to pt's c/o nausea, cramping & feeling 'big knots' in her belly. Cheryll Keisler, [employer]

## 2016-01-31 NOTE — Progress Notes (Signed)
Subjective: Pain is better.  Less distended.  Had a BM.  No flatus.  Objective: Vital signs in last 24 hours: Temp:  [98.1 F (36.7 C)-98.5 F (36.9 C)] 98.5 F (36.9 C) (02/19 0537) Pulse Rate:  [60-85] 85 (02/19 0537) Resp:  [17-18] 17 (02/19 0537) BP: (135-151)/(66-95) 139/71 mmHg (02/19 0537) SpO2:  [99 %-100 %] 100 % (02/19 0537) Last BM Date: 01/26/16  Intake/Output from previous day: 02/18 0701 - 02/19 0700 In: 1760 [P.O.:60; I.V.:1500; IV Piggyback:200] Out: 2475 [Urine:1000; Emesis/NG output:1475] Intake/Output this shift:    PE: General- In NAD Abdomen-soft, no significant tenderness, less distended, hypoactive bowel sounds  Lab Results:   Recent Labs  01/29/16 0825 01/30/16 0449  WBC 7.3 7.3  HGB 8.9* 9.0*  HCT 27.3* 28.2*  PLT 280 258   BMET  Recent Labs  01/29/16 0825 01/30/16 0449  NA 140 139  K 3.5 4.2  CL 109 109  CO2 23 23  GLUCOSE 103* 88  BUN <5* 5*  CREATININE 0.54 0.55  CALCIUM 8.5* 8.4*   PT/INR No results for input(s): LABPROT, INR in the last 72 hours. Comprehensive Metabolic Panel:    Component Value Date/Time   NA 139 01/30/2016 0449   NA 140 01/29/2016 0825   K 4.2 01/30/2016 0449   K 3.5 01/29/2016 0825   CL 109 01/30/2016 0449   CL 109 01/29/2016 0825   CO2 23 01/30/2016 0449   CO2 23 01/29/2016 0825   BUN 5* 01/30/2016 0449   BUN <5* 01/29/2016 0825   CREATININE 0.55 01/30/2016 0449   CREATININE 0.54 01/29/2016 0825   GLUCOSE 88 01/30/2016 0449   GLUCOSE 103* 01/29/2016 0825   CALCIUM 8.4* 01/30/2016 0449   CALCIUM 8.5* 01/29/2016 0825   AST 12* 01/28/2016 1944   AST 23 01/09/2016 1356   ALT 10* 01/28/2016 1944   ALT 27 01/09/2016 1356   ALKPHOS 69 01/28/2016 1944   ALKPHOS 77 01/09/2016 1356   BILITOT 0.5 01/28/2016 1944   BILITOT 1.1 01/09/2016 1356   PROT 7.3 01/28/2016 1944   PROT 8.2* 01/09/2016 1356   ALBUMIN 3.9 01/28/2016 1944   ALBUMIN 3.6 01/09/2016 1356     Studies/Results: Dg Abd 2  Views  01/30/2016  CLINICAL DATA:  40 year old female with lower abdominal pain, nausea and low-grade fever for 1 week. Small bowel obstruction. EXAM: ABDOMEN - 2 VIEW COMPARISON:  Prior abdominal radiograph 01/29/2016 FINDINGS: Nasogastric tube remains in good position. Persistent diffuse dilatation of multiple loops of small bowel with air-fluid levels in the upper abdomen. There has been mild interval distal progression of contrast material into the ascending colon to the level of the hepatic flexure. The lung bases are clear. No evidence of free air. IMPRESSION: 1. Persistent partial small bowel obstruction without significant interval change. 2. Slight continued progression of contrast material into the ascending colon. Electronically Signed   By: Jacqulynn Cadet M.D.   On: 01/30/2016 09:23   Dg Abd Portable 1v-small Bowel Obstruction Protocol-initial, 8 Hr Delay  01/29/2016  CLINICAL DATA:  Small bowel obstruction. Oral contrast given at 10:20 this morning via the patient's nasogastric tube. EXAM: PORTABLE ABDOMEN - 1 VIEW COMPARISON:  Abdomen radiographs and abdomen pelvis CT obtained earlier today. FINDINGS: Oral contrast in multiple mildly dilated small bowel loops. There is also oral contrast in normal caliber distal small bowel loops, right colon and hepatic flexure. Excreted contrast in the urinary bladder. Nasogastric tube tip in the distal stomach. Mild scoliosis. IMPRESSION: Stable partial small  bowel obstruction. Electronically Signed   By: Claudie Revering M.D.   On: 01/29/2016 18:44    Anti-infectives: Anti-infectives    Start     Dose/Rate Route Frequency Ordered Stop   01/29/16 0645  metroNIDAZOLE (FLAGYL) IVPB 500 mg     500 mg 100 mL/hr over 60 Minutes Intravenous 3 times per day 01/29/16 [id number]     01/29/16 0645  ciprofloxacin (CIPRO) IVPB 400 mg     400 mg 200 mL/hr over 60 Minutes Intravenous 2 times daily 01/29/16 [id number]        Assessment Principal Problem:   Partial SBO  (small bowel obstruction) (HCC)-functional vs mechanical; SBO protocol demonstrates contrast passing into right colon in 8 hours; contrast throughout colon on today's x-ray    LOS: 2 days   Plan:  Clamp ng tube.  Start clear liquids.    Lavada Langsam J 01/31/2016

## 2016-01-31 NOTE — Progress Notes (Signed)
Suction suspended to NGT for trial with clear liquids. Norma Montemurro, [employer]

## 2016-02-01 ENCOUNTER — Inpatient Hospital Stay (HOSPITAL_COMMUNITY): Payer: Medicaid Other

## 2016-02-01 LAB — GC/CHLAMYDIA PROBE AMP (~~LOC~~) NOT AT ARMC
Chlamydia: NEGATIVE
Neisseria Gonorrhea: NEGATIVE

## 2016-02-01 NOTE — Progress Notes (Signed)
Patient ID: CLESTER CISEK, female   DOB: 01-Jan-1976, 40 y.o.   MRN: [id number]     [address] [place], [place] [id number]    Phone: [phone] FAX: [phone]     Subjective: No n/v.  C/o cramping pain. Having diarrhea.   Objective:  Vital signs:  Filed Vitals:   01/31/16 0537 01/31/16 1507 01/31/16 2134 02/01/16 0539  BP: 139/71 132/66 138/69 147/85  Pulse: 85 67 65   Temp: 98.5 F (36.9 C) 99.2 F (37.3 C) 98.7 F (37.1 C) 99.2 F (37.3 C)  TempSrc: Oral Oral Oral Oral  Resp: 17 16 16 16   Height:      Weight:      SpO2: 100% 96% 100% 100%    Last BM Date: 01/31/16  Intake/Output   Yesterday:  02/19 0701 - 02/20 0700 In: [id number] [P.O.:180; I.V.:2375; IV Piggyback:1000] Out: 1500 [Emesis/NG output:1500] This shift:     Physical Exam: General: Pt awake/alert/oriented x4 in no acute distress Abdomen: Soft.  Nondistended.  Non tender.  No evidence of peritonitis.  No incarcerated hernias.    Problem List:   Principal Problem:   SBO (small bowel obstruction) (HCC) Active Problems:   Abdominal pain   Nausea with vomiting   Small bowel obstruction (HCC)   Hypokalemia   Anemia    Results:   Labs: No results found for this or any previous visit (from the past 51 hour(s)).  Imaging / Studies: Dg Abd 2 Views  02/01/2016  CLINICAL DATA:  Small bowel obstruction EXAM: ABDOMEN - 2 VIEW COMPARISON:  01/31/2016 FINDINGS: NG tube remains in stable position. Oral contrast material noted within the colon. Dilated small bowel loops in the mid abdomen compatible with partial small bowel obstruction. No free air organomegaly. IMPRESSION: Continued partial small bowel obstruction pattern.  No real change. Electronically Signed   By: Rolm Baptise M.D.   On: 02/01/2016 09:03   Dg Abd 2 Views  01/31/2016  CLINICAL DATA:  PT C/O CONTINUED ABD PAIN, WEAKNESS, STATES SHE HAS HAD CONSTIPATION X 1 WK, PRIOR  SBO, PT HAD BM IN THE NIGHT. EXAM: ABDOMEN - 2 VIEW COMPARISON:  01/30/2016 FINDINGS: Nasogastric tube is in place, tip overlying the level of the distal stomach. Gush persistent mild dilatation of small bowel loops the dilatation has improved. There has been some progression of contrast from small bowel loops to the colon. Air-fluid levels versus within mildly dilated loops of small bowel, also slightly improved. IMPRESSION: 1. Decreased small bowel dilatation. 2. Significant residual contrast, now progressed into distal colon. Electronically Signed   By: Nolon Nations M.D.   On: 01/31/2016 10:38    Medications / Allergies:  Scheduled Meds: . ciprofloxacin  400 mg Intravenous BID  . enoxaparin (LOVENOX) injection  40 mg Subcutaneous Q24H  . metronidazole  500 mg Intravenous 3 times per day   Continuous Infusions: . sodium chloride 0.9 % 1,000 mL with potassium chloride 40 mEq infusion 125 mL/hr at 02/01/16 0519   PRN Meds:.menthol-cetylpyridinium, morphine injection, ondansetron (ZOFRAN) IV, prochlorperazine  Antibiotics: Anti-infectives    Start     Dose/Rate Route Frequency Ordered Stop   01/29/16 0645  metroNIDAZOLE (FLAGYL) IVPB 500 mg     500 mg 100 mL/hr over 60 Minutes Intravenous 3 times per day 01/29/16 0633     01/29/16 0645  ciprofloxacin (CIPRO) IVPB 400 mg     400 mg 200 mL/hr over  60 Minutes Intravenous 2 times daily 01/29/16 0633          Assessment/Plan PSBO-AXR shows PSBO, mechanical versus functional.  Contrast throughout the colon.  Having diarrhea.  NGT output 1583ml/24h.  Dr. Excell Seltzer to eval, continue ngt decompression v clamping trial  Erby Pian, [facility] Pager [phone]([id number]:30P)   02/01/2016 9:38 AM

## 2016-02-01 NOTE — Progress Notes (Signed)
Date: February 01, 2016 Chart reviewed for concurrent status and case management needs. Will continue to follow patient for changes and needs:  sbo, ngtube, npo, iv lfds at 125cc/hrs Velva Harman, BSN, [place], [place]

## 2016-02-01 NOTE — Progress Notes (Addendum)
[facility] PROGRESS NOTE  ERENDIRA MENDLOWITZ [id number] DOB: [birth year] DOA: 01/28/2016 PCP: [facility]  Assessment/Plan: 1. Small bowel obstruction -She has a history of severe constipation. To be secondary to narcotic analgesics. -Presented with complaints of abdominal pain, distention that was associated with nausea and vomiting. Further workup included a CT scan of abdomen and pelvis with IV contrast that showed findings suggestive of small bowel obstruction. -She was made nothing by mouth and NG tube placed. -Gen. surgery was consulted and recommended minimizing narcotic suspect this could be exacerbated small bowel obstruction. -Repeat abdominal film performed on 01/30/2016 showing persistent partial small bowel obstruction without significant interval change compared to prior study. -Gen. surgery recommending scheduled Toradol. 30 milligrams of Toradol scheduled every 6 hours -On 01/31/2016 she is showing clinical improvement, now having positive bowel sounds. Abdomen less distended. She reported having a BM  -On 02/01/2016 she reports ongoing improvement, he diet was advanced to clears which she is tolerating. A repeat abdominal film showed no change in partial SBO.    2.  Hypokalemia -Presenting with a potassium of 3.1, improved to 4.2 with replacement -Repeat labs in AM.    Code Status: Full Code Family Communication Disposition Plan: Anticipate discharge home in the next 24-48 hours.    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 Workup included a CT scan of abdomen and pelvis with IV contrast that revealed dilated fluid-filled small bowel with transition zone and  left abdomen, findings suggestive of small bowel obstruction. General surgery was consulted. She was made nothing by mouth with placement of NG tube and started on IV fluids. Gen. surgery recommended minimizing narcotics suspecting that this could be exacerbating small bowel obstruction.   Objective: Filed Vitals:   01/31/16 2134 02/01/16 0539  BP: 138/69 147/85  Pulse: 65   Temp: 98.7 F (37.1 C) 99.2 F (37.3 C)  Resp: 16 16    Intake/Output Summary (Last 24 hours) at 02/01/16 1453 Last data filed at 02/01/16 1000  Gross per 24 hour  Intake 2126.67 ml  Output   1500 ml  Net 626.67 ml   Filed Weights   01/29/16 0619  Weight: 75.705 kg (166 lb 14.4 oz)    Exam:   General:  No acute distress awake and alert, looks better  Cardiovascular: Regular rate and rhythm, normal S1-S2  Respiratory: Normal inspiratory effort  Abdomen: Less distended. Now having bowel sounds  Musculoskeletal: No edema  Data Reviewed: Basic Metabolic Panel:  Recent Labs Lab 01/28/16 1944 01/29/16 0825 01/30/16 0449  NA 138 140 139  K 3.1* 3.5 4.2  CL 105 109 109  CO2 23 23 23   GLUCOSE 126* 103* 88  BUN <5* <5* 5*  CREATININE 0.67 0.54 0.55  CALCIUM 8.9 8.5* 8.4*  MG  --  1.6* 2.1   Liver Function Tests:  Recent Labs Lab 01/28/16 1944  AST 12*  ALT 10*  ALKPHOS 69  BILITOT 0.5  PROT 7.3  ALBUMIN 3.9    Recent Labs Lab 01/28/16 1944  LIPASE 12   No results for input(s): AMMONIA in the last 168 hours. CBC:  Recent Labs Lab 01/28/16 1944 01/29/16 0825 01/30/16 0449  WBC 11.1* 7.3 7.3  NEUTROABS 8.4*  --   --  HGB 10.2* 8.9* 9.0*  HCT 30.7* 27.3* 28.2*  MCV 89.2 89.2 91.3  PLT 317 280 258   Cardiac Enzymes: No results for input(s): CKTOTAL, CKMB, CKMBINDEX, TROPONINI in the last 168 hours. BNP (last 3 results) No results for input(s): BNP in the last 8760 hours.  ProBNP (last 3 results) No results for input(s): PROBNP in the last 8760  hours.  CBG:  Recent Labs Lab 01/29/16 0222  GLUCAP 130*    Recent Results (from the past 240 hour(s))  Wet prep, genital     Status: Abnormal   Collection Time: 01/29/16 12:11 AM  Result Value Ref Range Status   Yeast Wet Prep HPF POC NONE SEEN NONE SEEN Final   Trich, Wet Prep NONE SEEN NONE SEEN Final   Clue Cells Wet Prep HPF POC PRESENT (A) NONE SEEN Final   WBC, Wet Prep HPF POC MANY (A) NONE SEEN Final   Sperm NONE SEEN  Final     Studies: Dg Abd 2 Views  02/01/2016  CLINICAL DATA:  Small bowel obstruction EXAM: ABDOMEN - 2 VIEW COMPARISON:  01/31/2016 FINDINGS: NG tube remains in stable position. Oral contrast material noted within the colon. Dilated small bowel loops in the mid abdomen compatible with partial small bowel obstruction. No free air organomegaly. IMPRESSION: Continued partial small bowel obstruction pattern.  No real change. Electronically Signed   By: Rolm Baptise M.D.   On: 02/01/2016 09:03   Dg Abd 2 Views  01/31/2016  CLINICAL DATA:  PT C/O CONTINUED ABD PAIN, WEAKNESS, STATES SHE HAS HAD CONSTIPATION X 1 WK, PRIOR SBO, PT HAD BM IN THE NIGHT. EXAM: ABDOMEN - 2 VIEW COMPARISON:  01/30/2016 FINDINGS: Nasogastric tube is in place, tip overlying the level of the distal stomach. Gush persistent mild dilatation of small bowel loops the dilatation has improved. There has been some progression of contrast from small bowel loops to the colon. Air-fluid levels versus within mildly dilated loops of small bowel, also slightly improved. IMPRESSION: 1. Decreased small bowel dilatation. 2. Significant residual contrast, now progressed into distal colon. Electronically Signed   By: Nolon Nations M.D.   On: 01/31/2016 10:38    Scheduled Meds: . ciprofloxacin  400 mg Intravenous BID  . enoxaparin (LOVENOX) injection  40 mg Subcutaneous Q24H  . metronidazole  500 mg Intravenous 3 times per day   Continuous Infusions:    Principal Problem:   SBO (small bowel  obstruction) (HCC) Active Problems:   Abdominal pain   Nausea with vomiting   Small bowel obstruction (HCC)   Hypokalemia   Anemia    Time spent: 15 min    Suleyman Ehrman, [facility] Pager [phone]. If 7PM-7AM, please contact night-coverage at [email or web address], password [facility] 02/01/2016, 2:53 PM  LOS: 3 days

## 2016-02-02 ENCOUNTER — Inpatient Hospital Stay (HOSPITAL_COMMUNITY): Payer: Medicaid Other

## 2016-02-02 LAB — BASIC METABOLIC PANEL
Anion gap: 13 (ref 5–15)
BUN: <5 mg/dL — ABNORMAL LOW (ref 6–20)
CO2: 24 mmol/L (ref 22–32)
Calcium: 9 mg/dL (ref 8.9–10.3)
Chloride: 102 mmol/L (ref 101–111)
Creatinine, Ser: 0.64 mg/dL (ref 0.44–1.00)
GFR calc Af Amer: >60 mL/min (ref >60)
GFR calc non Af Amer: >60 mL/min (ref >60)
Glucose, Bld: 91 mg/dL (ref 65–99)
Potassium: 3.5 mmol/L (ref 3.5–5.1)
Sodium: 139 mmol/L (ref 135–145)

## 2016-02-02 MED ORDER — POTASSIUM CHLORIDE CRYS ER 20 MEQ PO TBCR
40.0000 meq | EXTENDED_RELEASE_TABLET | Freq: Once | ORAL | Status: AC
  Administered 2016-02-02: 40 meq via ORAL
  Filled 2016-02-02: qty 2

## 2016-02-02 MED ORDER — MORPHINE SULFATE (PF) 2 MG/ML IV SOLN
2.0000 mg | INTRAVENOUS | Status: DC | PRN
  Administered 2016-02-02 – 2016-02-04 (×21): 2 mg via INTRAVENOUS
  Filled 2016-02-02 (×22): qty 1

## 2016-02-02 NOTE — Progress Notes (Signed)
Patient being transferred to [facility]. Report called and given to [facility].

## 2016-02-02 NOTE — Progress Notes (Signed)
Patient ID: [name], female   DOB: [birth year], 40 y.o.   MRN: [id number]    Subjective: Overall thinks she is feeling a little better. Has had several loose bowel movements yesterday and today. NG is bothering her. She has a fairly high NG output but is drinking clear liquids. Still has intermittent crampy abdominal pain and says that morphine is not strong enough. She has been having intermittent abdominal pain even before this presentation.  Objective: Vital signs in last 24 hours: Temp:  [98.5 F (36.9 C)-99.1 F (37.3 C)] 98.5 F (36.9 C) 02/13/2023 0504) Pulse Rate:  [56-62] 62 02/13/2023 0504) Resp:  [17] 17 2023/02/13 0504) BP: (137-150)/(72-81) 137/73 mmHg 2023/02/13 0504) SpO2:  [99 %-100 %] 100 % 13-Feb-2023 0504) Last BM Date: 01/31/16  Intake/Output from previous day: 02/20 0701 - 13-Feb-2023 0700 In: 2055 [P.O.:1320; I.V.:375; NG/GT:60; IV Piggyback:300] Out: 1700 [Emesis/NG output:1700] Intake/Output this shift:    General appearance: alert, cooperative and no distress GI: minimal lower abdominal tenderness without guarding. Mildly distended. Bowel sounds present.  Lab Results:  No results for input(s): WBC, HGB, HCT, PLT in the last 72 hours. BMET  Recent Labs  02/14/2016 0422  NA 139  K 3.5  CL 102  CO2 24  GLUCOSE 91  BUN <5*  CREATININE 0.64  CALCIUM 9.0     Studies/Results: Dg Abd 2 Views  02-14-2016  CLINICAL DATA:  Small-bowel obstruction and abdominal pain EXAM: ABDOMEN - 2 VIEW COMPARISON:  02/01/2016 FINDINGS: Scattered large and small bowel gas is noted. Contrast material is noted throughout the colon from a recent CT. Nasogastric catheter remains within the stomach. Persistent mild small bowel dilatation is seen. The overall appearance has improved slightly in the interval from the prior exam. No free air is seen. No other focal abnormality is noted. IMPRESSION: Slight improvement in the degree of small bowel dilatation when compared with the prior study.  Electronically Signed   By: Inez Catalina M.D.   On: 02-14-2016 08:28   Dg Abd 2 Views  02/01/2016  CLINICAL DATA:  Small bowel obstruction EXAM: ABDOMEN - 2 VIEW COMPARISON:  01/31/2016 FINDINGS: NG tube remains in stable position. Oral contrast material noted within the colon. Dilated small bowel loops in the mid abdomen compatible with partial small bowel obstruction. No free air organomegaly. IMPRESSION: Continued partial small bowel obstruction pattern.  No real change. Electronically Signed   By: Rolm Baptise M.D.   On: 02/01/2016 09:03    Anti-infectives: Anti-infectives    Start     Dose/Rate Route Frequency Ordered Stop   01/29/16 0645  metroNIDAZOLE (FLAGYL) IVPB 500 mg     500 mg 100 mL/hr over 60 Minutes Intravenous 3 times per day 01/29/16 0633     01/29/16 0645  ciprofloxacin (CIPRO) IVPB 400 mg     400 mg 200 mL/hr over 60 Minutes Intravenous 2 times daily 01/29/16 [id number]        Assessment/Plan: Somewhat puzzling situation. 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 On Cipro and Flagyl for colitis. Overall somewhat improved but still has some distention.Not clear if this is ileus related to her chronic narcotics and/or colitis or partial mechanical obstruction. She has only had C-sections and no really good reason to have a mechanical obstruction. Overall she is stable or slightly improved. We will discontinue NG tube and continue clear liquids and see how  she does. If she worsens with further small bowel distention she may require laparoscopy/laparotomy.    LOS: 4 days    Guillaume Weninger T 02/02/2016

## 2016-02-02 NOTE — Progress Notes (Signed)
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 Workup included a CT scan of abdomen and pelvis with IV contrast that revealed dilated fluid-filled small bowel with transition zone and left abdomen, findings suggestive of small bowel obstruction. General surgery was consulted. She was made nothing by mouth with placement of NG tube and started on IV fluids. Gen. surgery recommended minimizing narcotics suspecting that this could be exacerbating small bowel obstruction. She showed gradual clinical improvement over her hospital course, with removal of NG tube and advancement of diet. Repeat abdominal film on 02/02/2016 showing light improvement of SBO  Assessment/Plan: 1. Small bowel obstruction -She has a history of severe constipation secondary to narcotic analgesics. -Presented with complaints of abdominal pain, distention that was associated with nausea and vomiting. Further workup included a CT scan of abdomen and pelvis with IV contrast that showed findings suggestive of small bowel obstruction. -She was made nothing by mouth and NG tube placed. -Gen. surgery was consulted and recommended minimizing narcotic suspect this could be exacerbated small bowel obstruction. -Repeat abdominal film performed on 01/30/2016 showing persistent partial small bowel obstruction without significant interval change compared to prior study. -Gen. surgery recommending scheduled Toradol. 30 milligrams of Toradol scheduled every 6 hours -On 01/31/2016 she is showing clinical improvement, now having positive bowel sounds. Abdomen less distended. She reported having a BM   -On 02/01/2016 she reports ongoing improvement, he diet was advanced to clears which she is tolerating. A repeat abdominal film showed no change in partial SBO.   -On 02/02/2016 she is tolerating clears. Repeat abdominal film is showing slight improvement in degree of SBO compared to previous study. Will to watch how she does of the next 24 hours.  2.  Hypokalemia -Labs showing potassium of 3.5 -She has been getting replacement during this hospitalization    Code Status: Full Code Family Communication Disposition Plan: Anticipate discharge home in the next 24 hours   Consultants:  General Surgery  Antibiotics:  Cipro  Flagyl  HPI/Subjective: She states feeling a little better although continues to have crampy abdominal pain. Denies N/V/Fevers/Chills  Objective: Filed Vitals:   02/01/16 2044 02/02/16 0504  BP: 144/81 137/73  Pulse: 56 62  Temp: 99.1 F (37.3 C) 98.5 F (36.9 C)  Resp: 17 17    Intake/Output Summary (Last 24 hours) at 02/02/16 1406 Last data filed at 02/02/16 0554  Gross per 24 hour  Intake    840 ml  Output   1700 ml  Net   -860 ml   Filed Weights   01/29/16 0619  Weight: 75.705 kg (166 lb 14.4 oz)    Exam:   General:  No acute distress awake and alert, looks better, NG tube removed  Cardiovascular: Regular rate and rhythm, normal S1-S2  Respiratory: Normal inspiratory effort  Abdomen: Less distended. Now having bowel sounds  Musculoskeletal: No edema  Data Reviewed: Basic Metabolic Panel:  Recent Labs Lab 01/28/16 1944 01/29/16 0825 01/30/16 0449 02/02/16 0422  NA 138 140 139 139  K 3.1* 3.5 4.2 3.5  CL 105 109 109 102  CO2 23 23 23 24   GLUCOSE 126* 103* 88 91  BUN <5* <5* 5* <5*  CREATININE 0.67  0.54 0.55 0.64  CALCIUM 8.9 8.5* 8.4* 9.0  MG  --  1.6* 2.1  --    Liver Function Tests:  Recent Labs Lab 01/28/16 1944  AST 12*  ALT 10*  ALKPHOS 69  BILITOT 0.5  PROT 7.3  ALBUMIN 3.9    Recent Labs Lab  01/28/16 1944  LIPASE 12   No results for input(s): AMMONIA in the last 168 hours. CBC:  Recent Labs Lab 01/28/16 1944 01/29/16 0825 01/30/16 0449  WBC 11.1* 7.3 7.3  NEUTROABS 8.4*  --   --   HGB 10.2* 8.9* 9.0*  HCT 30.7* 27.3* 28.2*  MCV 89.2 89.2 91.3  PLT 317 280 258   Cardiac Enzymes: No results for input(s): CKTOTAL, CKMB, CKMBINDEX, TROPONINI in the last 168 hours. BNP (last 3 results) No results for input(s): BNP in the last 8760 hours.  ProBNP (last 3 results) No results for input(s): PROBNP in the last 8760 hours.  CBG:  Recent Labs Lab 01/29/16 0222  GLUCAP 130*    Recent Results (from the past 240 hour(s))  Wet prep, genital     Status: Abnormal   Collection Time: 01/29/16 12:11 AM  Result Value Ref Range Status   Yeast Wet Prep HPF POC NONE SEEN NONE SEEN Final   Trich, Wet Prep NONE SEEN NONE SEEN Final   Clue Cells Wet Prep HPF POC PRESENT (A) NONE SEEN Final   WBC, Wet Prep HPF POC MANY (A) NONE SEEN Final   Sperm NONE SEEN  Final     Studies: Dg Abd 2 Views  02/02/2016  CLINICAL DATA:  Small-bowel obstruction and abdominal pain EXAM: ABDOMEN - 2 VIEW COMPARISON:  02/01/2016 FINDINGS: Scattered large and small bowel gas is noted. Contrast material is noted throughout the colon from a recent CT. Nasogastric catheter remains within the stomach. Persistent mild small bowel dilatation is seen. The overall appearance has improved slightly in the interval from the prior exam. No free air is seen. No other focal abnormality is noted. IMPRESSION: Slight improvement in the degree of small bowel dilatation when compared with the prior study. Electronically Signed   By: Inez Catalina M.D.   On: 02/02/2016 08:28   Dg Abd 2 Views  02/01/2016  CLINICAL DATA:  Small bowel obstruction EXAM: ABDOMEN - 2 VIEW COMPARISON:  01/31/2016 FINDINGS: NG tube remains in stable position. Oral contrast material noted within the colon. Dilated small bowel loops in the mid abdomen  compatible with partial small bowel obstruction. No free air organomegaly. IMPRESSION: Continued partial small bowel obstruction pattern.  No real change. Electronically Signed   By: Rolm Baptise M.D.   On: 02/01/2016 09:03    Scheduled Meds: . ciprofloxacin  400 mg Intravenous BID  . enoxaparin (LOVENOX) injection  40 mg Subcutaneous Q24H  . metronidazole  500 mg Intravenous 3 times per day   Continuous Infusions:    Principal Problem:   SBO (small bowel obstruction) (HCC) Active Problems:   Abdominal pain   Nausea with vomiting   Small bowel obstruction (HCC)   Hypokalemia   Anemia    Time spent: 15 min    Prabhav Faulkenberry, [facility] Pager [phone]. If 7PM-7AM, please contact night-coverage at [email or web address], password [facility] 02/02/2016, 2:06 PM  LOS: 4 days

## 2016-02-03 ENCOUNTER — Inpatient Hospital Stay (HOSPITAL_COMMUNITY): Payer: Medicaid Other

## 2016-02-03 DIAGNOSIS — D509 Iron deficiency anemia, unspecified: Secondary | ICD-10-CM

## 2016-02-03 LAB — BASIC METABOLIC PANEL
Anion gap: 10 (ref 5–15)
BUN: <5 mg/dL — ABNORMAL LOW (ref 6–20)
CO2: 26 mmol/L (ref 22–32)
Calcium: 9.2 mg/dL (ref 8.9–10.3)
Chloride: 102 mmol/L (ref 101–111)
Creatinine, Ser: 0.65 mg/dL (ref 0.44–1.00)
GFR calc Af Amer: >60 mL/min (ref >60)
GFR calc non Af Amer: >60 mL/min (ref >60)
Glucose, Bld: 118 mg/dL — ABNORMAL HIGH (ref 65–99)
Potassium: 3.4 mmol/L — ABNORMAL LOW (ref 3.5–5.1)
Sodium: 138 mmol/L (ref 135–145)

## 2016-02-03 LAB — CBC
HCT: 30.1 % — ABNORMAL LOW (ref 36.0–46.0)
Hemoglobin: 9.5 g/dL — ABNORMAL LOW (ref 12.0–15.0)
MCH: 28.4 pg (ref 26.0–34.0)
MCHC: 31.6 g/dL (ref 30.0–36.0)
MCV: 90.1 fL (ref 78.0–100.0)
Platelets: 224 10*3/uL (ref 150–400)
RBC: 3.34 MIL/uL — ABNORMAL LOW (ref 3.87–5.11)
RDW: 16.3 % — ABNORMAL HIGH (ref 11.5–15.5)
WBC: 5.8 10*3/uL (ref 4.0–10.5)

## 2016-02-03 LAB — MAGNESIUM: Magnesium: 1.6 mg/dL — ABNORMAL LOW (ref 1.7–2.4)

## 2016-02-03 MED ORDER — MAGNESIUM SULFATE 4 GM/100ML IV SOLN
4.0000 g | Freq: Once | INTRAVENOUS | Status: AC
  Administered 2016-02-03: 4 g via INTRAVENOUS
  Filled 2016-02-03: qty 100

## 2016-02-03 MED ORDER — SODIUM CHLORIDE 0.45 % IV SOLN
INTRAVENOUS | Status: DC
  Administered 2016-02-03 – 2016-02-04 (×3): via INTRAVENOUS
  Filled 2016-02-03 (×7): qty 1000

## 2016-02-03 MED ORDER — CEFOXITIN SODIUM 2 G IV SOLR
2.0000 g | INTRAVENOUS | Status: DC
  Filled 2016-02-03: qty 2

## 2016-02-03 MED ORDER — KETOROLAC TROMETHAMINE 30 MG/ML IJ SOLN
30.0000 mg | Freq: Four times a day (QID) | INTRAMUSCULAR | Status: DC | PRN
  Administered 2016-02-03 – 2016-02-05 (×5): 30 mg via INTRAVENOUS
  Filled 2016-02-03 (×5): qty 1

## 2016-02-03 MED ORDER — POTASSIUM CHLORIDE CRYS ER 20 MEQ PO TBCR
40.0000 meq | EXTENDED_RELEASE_TABLET | ORAL | Status: AC
  Administered 2016-02-03 (×2): 40 meq via ORAL
  Filled 2016-02-03 (×2): qty 2

## 2016-02-03 NOTE — Progress Notes (Signed)
Patient ID: [name], female   DOB: January 30, 1976, 40 y.o.   MRN: [id number]     [place]      [address] [place], [place] [address]    Phone: [phone] FAX: [phone]     Subjective: No n/v. Tolerating clears.  No flatus or BM.   Objective:  Vital signs:  Filed Vitals:   02/02/16 0504 02/02/16 1500 02/02/16 2133 02/03/16 0557  BP: 137/73 169/68 136/72 126/61  Pulse: 62 62 75 58  Temp: 98.5 F (36.9 C) 98.1 F (36.7 C) 99.9 F (37.7 C) 98.9 F (37.2 C)  TempSrc: Oral Oral Oral Oral  Resp: 17 18 18 18   Height:      Weight:      SpO2: 100% 100% 99% 100%    Last BM Date: 01/31/16  Intake/Output   Yesterday:  02/21 0701 - 02/22 0700 In: 960 [P.O.:960] Out: -  This shift:  Total I/O In: 200 [IV Piggyback:200] Out: -    Physical Exam: General: Pt awake/alert/oriented x4 in no acute distress  Abdomen: Soft.  distended.  TTP RLQ.   No evidence of peritonitis.  No incarcerated hernias.    Problem List:   Principal Problem:   SBO (small bowel obstruction) (HCC) Active Problems:   Abdominal pain   Nausea with vomiting   Small bowel obstruction (HCC)   Hypokalemia   Anemia    Results:   Labs: Results for orders placed or performed during the hospital encounter of 01/28/16 (from the past 48 hour(s))  Basic metabolic panel     Status: Abnormal   Collection Time: 02/02/16  4:22 AM  Result Value Ref Range   Sodium 139 135 - 145 mmol/L   Potassium 3.5 3.5 - 5.1 mmol/L   Chloride 102 101 - 111 mmol/L   CO2 24 22 - 32 mmol/L   Glucose, Bld 91 65 - 99 mg/dL   BUN <5 (L) 6 - 20 mg/dL   Creatinine, Ser 0.64 0.44 - 1.00 mg/dL   Calcium 9.0 8.9 - 10.3 mg/dL   GFR calc non Af Amer >60 >60 mL/min   GFR calc Af Amer >60 >60 mL/min    Comment: (NOTE) The eGFR has been calculated using the CKD EPI equation. This calculation has not been validated in all clinical situations. eGFR's persistently <60  mL/min signify possible Chronic Kidney Disease.    Anion gap 13 5 - 15  CBC     Status: Abnormal   Collection Time: 02/03/16  4:43 AM  Result Value Ref Range   WBC 5.8 4.0 - 10.5 K/uL   RBC 3.34 (L) 3.87 - 5.11 MIL/uL   Hemoglobin 9.5 (L) 12.0 - 15.0 g/dL   HCT 30.1 (L) 36.0 - 46.0 %   MCV 90.1 78.0 - 100.0 fL   MCH 28.4 26.0 - 34.0 pg   MCHC 31.6 30.0 - 36.0 g/dL   RDW 16.3 (H) 11.5 - 15.5 %   Platelets 224 150 - 400 K/uL  Basic metabolic panel     Status: Abnormal   Collection Time: 02/03/16  4:43 AM  Result Value Ref Range   Sodium 138 135 - 145 mmol/L   Potassium 3.4 (L) 3.5 - 5.1 mmol/L   Chloride 102 101 - 111 mmol/L   CO2 26 22 - 32 mmol/L   Glucose, Bld 118 (H) 65 - 99 mg/dL   BUN <5 (L) 6 - 20 mg/dL   Creatinine, Ser 0.65 0.44 -  1.00 mg/dL   Calcium 9.2 8.9 - 10.3 mg/dL   GFR calc non Af Amer >60 >60 mL/min   GFR calc Af Amer >60 >60 mL/min    Comment: (NOTE) The eGFR has been calculated using the CKD EPI equation. This calculation has not been validated in all clinical situations. eGFR's persistently <60 mL/min signify possible Chronic Kidney Disease.    Anion gap 10 5 - 15    Imaging / Studies: Dg Abd 2 Views  02/03/2016  CLINICAL DATA:  Follow-up small bowel obstruction EXAM: ABDOMEN - 2 VIEW COMPARISON:  02/02/2016 FINDINGS: There is slight worsening of small bowel distension with air-fluid levels in upper abdomen consistent with partial small bowel obstruction. Again noted contrast material throughout the colon. NG tube has been removed. There is no evidence of free abdominal air. IMPRESSION: There is slight worsening small bowel distension with air-fluid levels in mid and upper abdomen consistent with at least partial small bowel obstruction. Again noted contrast material throughout the colon. NG tube has been removed. Electronically Signed   By: Lahoma Crocker M.D.   On: 02/03/2016 08:45   Dg Abd 2 Views  02/02/2016  CLINICAL DATA:  Small-bowel obstruction and  abdominal pain EXAM: ABDOMEN - 2 VIEW COMPARISON:  02/01/2016 FINDINGS: Scattered large and small bowel gas is noted. Contrast material is noted throughout the colon from a recent CT. Nasogastric catheter remains within the stomach. Persistent mild small bowel dilatation is seen. The overall appearance has improved slightly in the interval from the prior exam. No free air is seen. No other focal abnormality is noted. IMPRESSION: Slight improvement in the degree of small bowel dilatation when compared with the prior study. Electronically Signed   By: Inez Catalina M.D.   On: 02/02/2016 08:28    Medications / Allergies:  Scheduled Meds: . ciprofloxacin  400 mg Intravenous BID  . enoxaparin (LOVENOX) injection  40 mg Subcutaneous Q24H  . metronidazole  500 mg Intravenous 3 times per day  . potassium chloride  40 mEq Oral Q4H   Continuous Infusions:  PRN Meds:.menthol-cetylpyridinium, morphine injection, ondansetron (ZOFRAN) IV, prochlorperazine  Antibiotics: Anti-infectives    Start     Dose/Rate Route Frequency Ordered Stop   01/29/16 0645  metroNIDAZOLE (FLAGYL) IVPB 500 mg     500 mg 100 mL/hr over 60 Minutes Intravenous 3 times per day 01/29/16 0633     01/29/16 0645  ciprofloxacin (CIPRO) IVPB 400 mg     400 mg 200 mL/hr over 60 Minutes Intravenous 2 times daily 01/29/16 9826          Assessment/Plan PSBO-AXR worse, abdomen is distended, TTP RLQ, no flatus or BM.  No n/v and tolerating clears.  Make her NPO for now until Dr. Excell Seltzer can evaluate regarding proceeding with surgery.   Erby Pian, [facility] Pager [phone]([id number])   02/03/2016 9:13 AM

## 2016-02-03 NOTE — Progress Notes (Signed)
[facility] PROGRESS NOTE  [name] [id number] DOB: 04/24/1976 DOA: 01/28/2016 PCP: [facility]  Assessment/Plan: #1 small bowel obstruction Patient with abdominal distention and some abdominal pain with no flatus no bowel movements and worsening abdominal films. Patient currently nothing by mouth. Replete electrolytes tried to keep potassium greater than 4 and magnesium greater than 2. Try to minimize narcotics. Toradol as needed. Patient is being followed by general surgery and patient for possible laparoscopy tomorrow.  #2 hypokalemia/hypomagnesemia Replete. Keep magnesium greater than 2 and potassium greater than 4.  #3 chronic iron deficiency anemia Secondary to heavy menstrual periods and fibroids. Hemoglobin currently stable. Follow H&H. Will likely need oral iron supplementation on discharge.  #4 prophylaxis SCDs for DVT prophylaxis.  Code Status: Full Family Communication: Updated patient and family at bedside. Disposition Plan: Once small bowel obstruction has resolved. Per general surgery.   Consultants:  Gen. surgery: Dr. Alvino Blood 01/29/2016  Procedures:  Abdominal x-rays 01/30/2016, 01/31/2016, 02/01/2016, 02/02/2016, 02/03/2016  CT abdomen and pelvis 01/29/2016  Antibiotics: None  HPI/Subjective: Patient tolerating clear liquids. Patient with abdominal distention and abdominal pain. No bowel movement. No flatus.  Objective: Filed Vitals:   02/02/16 2133 02/03/16 0557  BP: 136/72 126/61  Pulse: 75 58  Temp: 99.9 F (37.7 C) 98.9 F (37.2 C)  Resp: 18 18    Intake/Output Summary (Last 24 hours) at 02/03/16 1207 Last data filed at 02/03/16 [id number]  Gross per 24 hour  Intake   1160 ml  Output      0 ml  Net   1160 ml   Filed Weights   01/29/16 0619  Weight: 75.705 kg (166 lb 14.4 oz)    Exam:   General:  NAD  Cardiovascular: RRR  Respiratory: CTAB  Abdomen: Soft, distended, some tenderness to palpation, positive bowel  sounds.  Musculoskeletal: No clubbing cyanosis or edema.  Data Reviewed: Basic Metabolic Panel:  Recent Labs Lab 01/28/16 1944 01/29/16 0825 01/30/16 0449 02/02/16 0422 02/03/16 0443  NA 138 140 139 139 138  K 3.1* 3.5 4.2 3.5 3.4*  CL 105 109 109 102 102  CO2 23 23 23 24 26   GLUCOSE 126* 103* 88 91 118*  BUN <5* <5* 5* <5* <5*  CREATININE 0.67 0.54 0.55 0.64 0.65  CALCIUM 8.9 8.5* 8.4* 9.0 9.2  MG  --  1.6* 2.1  --  1.6*   Liver Function Tests:  Recent Labs Lab 01/28/16 1944  AST 12*  ALT 10*  ALKPHOS 69  BILITOT 0.5  PROT 7.3  ALBUMIN 3.9    Recent Labs Lab 01/28/16 1944  LIPASE 12   No results for input(s): AMMONIA in the last 168 hours. CBC:  Recent Labs Lab 01/28/16 1944 01/29/16 0825 01/30/16 0449 02/03/16 0443  WBC 11.1* 7.3 7.3 5.8  NEUTROABS 8.4*  --   --   --   HGB 10.2* 8.9* 9.0* 9.5*  HCT 30.7* 27.3* 28.2* 30.1*  MCV 89.2 89.2 91.3 90.1  PLT 317 280 258 224   Cardiac Enzymes: No results for input(s): CKTOTAL, CKMB, CKMBINDEX, TROPONINI in the last 168 hours. BNP (last 3 results) No results for input(s): BNP in the last 8760 hours.  ProBNP (last 3 results) No results for input(s): PROBNP in the last 8760 hours.  CBG:  Recent Labs Lab 01/29/16 0222  GLUCAP 130*    Recent Results (from the past 240 hour(s))  Wet prep, genital     Status: Abnormal   Collection Time: 01/29/16 12:11 AM  Result Value Ref Range Status   Yeast Wet Prep HPF POC NONE SEEN NONE SEEN Final   Trich, Wet Prep NONE SEEN NONE SEEN Final   Clue Cells Wet Prep HPF POC PRESENT (A) NONE SEEN Final   WBC, Wet Prep HPF POC MANY (A) NONE SEEN Final   Sperm NONE SEEN  Final     Studies: Dg Abd 2 Views  02/03/2016  CLINICAL DATA:  Follow-up small bowel obstruction EXAM: ABDOMEN - 2 VIEW COMPARISON:  02/02/2016 FINDINGS: There is slight worsening of small bowel distension with air-fluid levels in upper abdomen consistent with partial small bowel obstruction.  Again noted contrast material throughout the colon. NG tube has been removed. There is no evidence of free abdominal air. IMPRESSION: There is slight worsening small bowel distension with air-fluid levels in mid and upper abdomen consistent with at least partial small bowel obstruction. Again noted contrast material throughout the colon. NG tube has been removed. Electronically Signed   By: Lahoma Crocker M.D.   On: 02/03/2016 08:45   Dg Abd 2 Views  02/02/2016  CLINICAL DATA:  Small-bowel obstruction and abdominal pain EXAM: ABDOMEN - 2 VIEW COMPARISON:  02/01/2016 FINDINGS: Scattered large and small bowel gas is noted. Contrast material is noted throughout the colon from a recent CT. Nasogastric catheter remains within the stomach. Persistent mild small bowel dilatation is seen. The overall appearance has improved slightly in the interval from the prior exam. No free air is seen. No other focal abnormality is noted. IMPRESSION: Slight improvement in the degree of small bowel dilatation when compared with the prior study. Electronically Signed   By: Inez Catalina M.D.   On: 02/02/2016 08:28    Scheduled Meds: . ciprofloxacin  400 mg Intravenous BID  . enoxaparin (LOVENOX) injection  40 mg Subcutaneous Q24H  . magnesium sulfate 1 - 4 g bolus IVPB  4 g Intravenous Once  . metronidazole  500 mg Intravenous 3 times per day  . potassium chloride  40 mEq Oral Q4H   Continuous Infusions:   Principal Problem:   SBO (small bowel obstruction) (HCC) Active Problems:   Abdominal pain   Nausea with vomiting   Small bowel obstruction (HCC)   Hypokalemia   Anemia    Time spent: 57 minutes    THOMPSON,DANIEL M.D. [facility] Pager [phone]. If 7PM-7AM, please contact night-coverage at [email or web address], password [facility] 02/03/2016, 12:07 PM  LOS: 5 days

## 2016-02-04 ENCOUNTER — Encounter (HOSPITAL_COMMUNITY): Payer: Self-pay | Admitting: Anesthesiology

## 2016-02-04 ENCOUNTER — Encounter (HOSPITAL_COMMUNITY)
Admission: EM | Disposition: A | Discharge status: Home / Self Care | Payer: Self-pay | Source: Home / Self Care | Attending: Internal Medicine

## 2016-02-04 ENCOUNTER — Inpatient Hospital Stay (HOSPITAL_COMMUNITY): Payer: Medicaid Other

## 2016-02-04 LAB — BASIC METABOLIC PANEL
Anion gap: 9 (ref 5–15)
BUN: <5 mg/dL — ABNORMAL LOW (ref 6–20)
CO2: 19 mmol/L — ABNORMAL LOW (ref 22–32)
Calcium: 8.8 mg/dL — ABNORMAL LOW (ref 8.9–10.3)
Chloride: 105 mmol/L (ref 101–111)
Creatinine, Ser: 0.63 mg/dL (ref 0.44–1.00)
GFR calc Af Amer: >60 mL/min (ref >60)
GFR calc non Af Amer: >60 mL/min (ref >60)
Glucose, Bld: 79 mg/dL (ref 65–99)
Potassium: 4.8 mmol/L (ref 3.5–5.1)
Sodium: 133 mmol/L — ABNORMAL LOW (ref 135–145)

## 2016-02-04 LAB — CBC
HCT: 31 % — ABNORMAL LOW (ref 36.0–46.0)
Hemoglobin: 9.9 g/dL — ABNORMAL LOW (ref 12.0–15.0)
MCH: 29 pg (ref 26.0–34.0)
MCHC: 31.9 g/dL (ref 30.0–36.0)
MCV: 90.9 fL (ref 78.0–100.0)
Platelets: 252 10*3/uL (ref 150–400)
RBC: 3.41 MIL/uL — ABNORMAL LOW (ref 3.87–5.11)
RDW: 16.4 % — ABNORMAL HIGH (ref 11.5–15.5)
WBC: 6 10*3/uL (ref 4.0–10.5)

## 2016-02-04 LAB — MRSA PCR SCREENING: MRSA by PCR: NEGATIVE

## 2016-02-04 LAB — MAGNESIUM: Magnesium: 2.2 mg/dL (ref 1.7–2.4)

## 2016-02-04 SURGERY — LAPAROSCOPY, DIAGNOSTIC
Anesthesia: General

## 2016-02-04 MED ORDER — MORPHINE SULFATE (PF) 2 MG/ML IV SOLN
1.0000 mg | INTRAVENOUS | Status: DC | PRN
  Administered 2016-02-04 – 2016-02-05 (×5): 2 mg via INTRAVENOUS
  Filled 2016-02-04 (×5): qty 1

## 2016-02-04 NOTE — Progress Notes (Addendum)
[facility] PROGRESS NOTE  [name] [id number] DOB: 1976/01/25 DOA: 01/28/2016 PCP: [facility]  Assessment/Plan: #1 small bowel obstruction Patient with less abdominal distention and less abdominal pain with + flatus and bowel movements, today. Abdominal films with some improvement. Patient has been started on clear liquids per general surgery. Replete electrolytes to keep potassium greater than 4 and magnesium greater than 2. Try to minimize narcotics. Toradol as needed. Patient is being followed by general surgery and surgery canceled for today patient to a monitored conservatively. Antibiotics have been discontinued per general surgery. Will advance to full liquid diet for supper.  #2 hypokalemia/hypomagnesemia Replete. Keep magnesium greater than 2 and potassium greater than 4.  #3 chronic iron deficiency anemia Secondary to heavy menstrual periods and fibroids. Hemoglobin currently stable. Follow H&H. Will likely need oral iron supplementation on discharge.  #4 prophylaxis SCDs for DVT prophylaxis.  Code Status: Full Family Communication: Updated patient and family at bedside. Disposition Plan: Per general surgery.   Consultants:  Gen. surgery: Dr. Alvino Blood 01/29/2016  Procedures:  Abdominal x-rays 01/30/2016, 01/31/2016, 02/01/2016, 02/02/2016, 02/03/2016, 02/04/2016  CT abdomen and pelvis 01/29/2016  Antibiotics: Ciprofloxacin Flagyl  HPI/Subjective: Patient with less abdominal distention and abdominal pain. Patient states with BM today, soft. Positive flatus.  Objective: Filed Vitals:   02/03/16 2122 02/04/16 0508  BP: 128/65 123/64  Pulse: 64 63  Temp: 98.4 F (36.9 C) 98.9 F (37.2 C)  Resp: 19 16    Intake/Output Summary (Last 24 hours) at 02/04/16 1411 Last data filed at 02/04/16 1046  Gross per 24 hour  Intake   1570 ml  Output      0 ml  Net   1570 ml   Filed Weights   01/29/16 0619  Weight: 75.705 kg (166 lb 14.4 oz)     Exam:   General:  NAD  Cardiovascular: RRR  Respiratory: CTAB  Abdomen: Soft, less distended, less tenderness to palpation, positive bowel sounds.  Musculoskeletal: No clubbing cyanosis or edema.  Data Reviewed: Basic Metabolic Panel:  Recent Labs Lab 01/29/16 0825 01/30/16 0449 02/02/16 0422 02/03/16 0443 02/04/16 0435  NA 140 139 139 138 133*  K 3.5 4.2 3.5 3.4* 4.8  CL 109 109 102 102 105  CO2 23 23 24 26  19*  GLUCOSE 103* 88 91 118* 79  BUN <5* 5* <5* <5* <5*  CREATININE 0.54 0.55 0.64 0.65 0.63  CALCIUM 8.5* 8.4* 9.0 9.2 8.8*  MG 1.6* 2.1  --  1.6* 2.2   Liver Function Tests:  Recent Labs Lab 01/28/16 1944  AST 12*  ALT 10*  ALKPHOS 69  BILITOT 0.5  PROT 7.3  ALBUMIN 3.9    Recent Labs Lab 01/28/16 1944  LIPASE 12   No results for input(s): AMMONIA in the last 168 hours. CBC:  Recent Labs Lab 01/28/16 1944 01/29/16 0825 01/30/16 0449 02/03/16 0443 02/04/16 0435  WBC 11.1* 7.3 7.3 5.8 6.0  NEUTROABS 8.4*  --   --   --   --   HGB 10.2* 8.9* 9.0* 9.5* 9.9*  HCT 30.7* 27.3* 28.2* 30.1* 31.0*  MCV 89.2 89.2 91.3 90.1 90.9  PLT 317 280 258 224 252   Cardiac Enzymes: No results for input(s): CKTOTAL, CKMB, CKMBINDEX, TROPONINI in the last 168 hours. BNP (last 3 results) No results for input(s): BNP in the last 8760 hours.  ProBNP (last 3 results) No results for input(s): PROBNP in the last 8760 hours.  CBG:  Recent Labs Lab 01/29/16 0222  GLUCAP 130*    Recent Results (from the past 240 hour(s))  Wet prep, genital     Status: Abnormal   Collection Time: 01/29/16 12:11 AM  Result Value Ref Range Status   Yeast Wet Prep HPF POC NONE SEEN NONE SEEN Final   Trich, Wet Prep NONE SEEN NONE SEEN Final   Clue Cells Wet Prep HPF POC PRESENT (A) NONE SEEN Final   WBC, Wet Prep HPF POC MANY (A) NONE SEEN Final   Sperm NONE SEEN  Final  MRSA PCR Screening     Status: None   Collection Time: 02/04/16  6:35 AM  Result Value Ref  Range Status   MRSA by PCR NEGATIVE NEGATIVE Final    Comment:        The GeneXpert MRSA Assay (FDA approved for NASAL specimens only), is one component of a comprehensive MRSA colonization surveillance program. It is not intended to diagnose MRSA infection nor to guide or monitor treatment for MRSA infections.      Studies: Dg Abd 2 Views  02/04/2016  CLINICAL DATA:  Small bowel obstruction. Possible surgery this morning. EXAM: ABDOMEN - 2 VIEW COMPARISON:  02/03/2016 FINDINGS: Unchanged bowel gas pattern with small more than large bowel distention. Small bowel distention is improved compared to yesterday. Oral contrast still seen in the proximal colon, with interval emptying of the distal colon and rectum. No concerning intra-abdominal mass effect or calcification. No pneumoperitoneum. Lung bases are clear. IMPRESSION: Partial small bowel obstruction pattern. Small bowel distention is improved from yesterday. Electronically Signed   By: Monte Fantasia M.D.   On: 02/04/2016 08:05   Dg Abd 2 Views  02/03/2016  CLINICAL DATA:  Follow-up small bowel obstruction EXAM: ABDOMEN - 2 VIEW COMPARISON:  02/02/2016 FINDINGS: There is slight worsening of small bowel distension with air-fluid levels in upper abdomen consistent with partial small bowel obstruction. Again noted contrast material throughout the colon. NG tube has been removed. There is no evidence of free abdominal air. IMPRESSION: There is slight worsening small bowel distension with air-fluid levels in mid and upper abdomen consistent with at least partial small bowel obstruction. Again noted contrast material throughout the colon. NG tube has been removed. Electronically Signed   By: Lahoma Crocker M.D.   On: 02/03/2016 08:45    Scheduled Meds: . enoxaparin (LOVENOX) injection  40 mg Subcutaneous Q24H   Continuous Infusions: . sodium chloride 0.45 % 1,000 mL with potassium chloride 40 mEq infusion 100 mL/hr at 02/04/16 1039     Principal Problem:   SBO (small bowel obstruction) (HCC) Active Problems:   Abdominal pain   Nausea with vomiting   Small bowel obstruction (HCC)   Hypokalemia   Anemia    Time spent: 14 minutes    Loyed Wilmes M.D. [facility] Pager [phone]. If 7PM-7AM, please contact night-coverage at [email or web address], password [facility] 02/04/2016, 2:11 PM  LOS: 6 days

## 2016-02-04 NOTE — Progress Notes (Signed)
Patient ID: [name], female   DOB: Nov 01, 1976, 40 y.o.   MRN: [id number]     [facility] [place], [place] [address]    Phone: [phone] FAX: [phone]     Subjective: Having diarrhea, frequent.  Still c/o lower abdominal pain. Morphine fairly consistently. Takes narcotics on chronic basis. AXR shows improved distention when compared to yesterday.   Objective:  Vital signs:  Filed Vitals:   02/03/16 0557 02/03/16 1428 02/03/16 2122 02/04/16 0508  BP: 126/61 127/66 128/65 123/64  Pulse: 58 65 64 63  Temp: 98.9 F (37.2 C) 98.3 F (36.8 C) 98.4 F (36.9 C) 98.9 F (37.2 C)  TempSrc: Oral Oral Oral Oral  Resp: _0 Height:      Weight:      SpO2: 100% 98% 98% 100%    Last BM Date: 01/31/16  Intake/Output   Yesterday:  02/22 0701 - 02/23 0700 In: 1320 [I.V.:1120; IV Piggyback:200] Out: -  This shift: I/O last 3 completed shifts: In: 2040 [P.O.:720; I.V.:1120; IV Piggyback:200] Out: -    Physical Exam: General: Pt awake/alert/oriented x4 in no acute distress  Abdomen: Soft. Non distended. No evidence of peritonitis. No incarcerated hernias.  Problem List:   Principal Problem:   SBO (small bowel obstruction) (HCC) Active Problems:   Abdominal pain   Nausea with vomiting   Small bowel obstruction (HCC)   Hypokalemia   Anemia    Results:   Labs: Results for orders placed or performed during the hospital encounter of 01/28/16 (from the past 48 hour(s))  CBC     Status: Abnormal   Collection Time: 02/03/16  4:43 AM  Result Value Ref Range   WBC 5.8 4.0 - 10.5 K/uL   RBC 3.34 (L) 3.87 - 5.11 MIL/uL   Hemoglobin 9.5 (L) 12.0 - 15.0 g/dL   HCT 30.1 (L) 36.0 - 46.0 %   MCV 90.1 78.0 - 100.0 fL   MCH 28.4 26.0 - 34.0 pg   MCHC 31.6 30.0 - 36.0 g/dL   RDW 16.3 (H) 11.5 - 15.5 %   Platelets 224 150 - 400 K/uL  Basic metabolic panel     Status: Abnormal   Collection  Time: 02/03/16  4:43 AM  Result Value Ref Range   Sodium 138 135 - 145 mmol/L   Potassium 3.4 (L) 3.5 - 5.1 mmol/L   Chloride 102 101 - 111 mmol/L   CO2 26 22 - 32 mmol/L   Glucose, Bld 118 (H) 65 - 99 mg/dL   BUN <5 (L) 6 - 20 mg/dL   Creatinine, Ser 0.65 0.44 - 1.00 mg/dL   Calcium 9.2 8.9 - 10.3 mg/dL   GFR calc non Af Amer >60 >60 mL/min   GFR calc Af Amer >60 >60 mL/min    Comment: (NOTE) The eGFR has been calculated using the CKD EPI equation. This calculation has not been validated in all clinical situations. eGFR's persistently <60 mL/min signify possible Chronic Kidney Disease.    Anion gap 10 5 - 15  Magnesium     Status: Abnormal   Collection Time: 02/03/16  4:43 AM  Result Value Ref Range   Magnesium 1.6 (L) 1.7 - 2.4 mg/dL  Magnesium     Status: None   Collection Time: 02/04/16  4:35 AM  Result Value Ref Range   Magnesium 2.2 1.7 - 2.4 mg/dL  CBC  Status: Abnormal   Collection Time: 02/04/16  4:35 AM  Result Value Ref Range   WBC 6.0 4.0 - 10.5 K/uL   RBC 3.41 (L) 3.87 - 5.11 MIL/uL   Hemoglobin 9.9 (L) 12.0 - 15.0 g/dL   HCT 31.0 (L) 36.0 - 46.0 %   MCV 90.9 78.0 - 100.0 fL   MCH 29.0 26.0 - 34.0 pg   MCHC 31.9 30.0 - 36.0 g/dL   RDW 16.4 (H) 11.5 - 15.5 %   Platelets 252 150 - 400 K/uL  Basic metabolic panel     Status: Abnormal   Collection Time: 02/04/16  4:35 AM  Result Value Ref Range   Sodium 133 (L) 135 - 145 mmol/L   Potassium 4.8 3.5 - 5.1 mmol/L    Comment: DELTA CHECK NOTED REPEATED TO VERIFY    Chloride 105 101 - 111 mmol/L   CO2 19 (L) 22 - 32 mmol/L   Glucose, Bld 79 65 - 99 mg/dL   BUN <5 (L) 6 - 20 mg/dL   Creatinine, Ser 0.63 0.44 - 1.00 mg/dL   Calcium 8.8 (L) 8.9 - 10.3 mg/dL   GFR calc non Af Amer >60 >60 mL/min   GFR calc Af Amer >60 >60 mL/min    Comment: (NOTE) The eGFR has been calculated using the CKD EPI equation. This calculation has not been validated in all clinical situations. eGFR's persistently <60 mL/min  signify possible Chronic Kidney Disease.    Anion gap 9 5 - 15    Imaging / Studies: Dg Abd 2 Views  02/04/2016  CLINICAL DATA:  Small bowel obstruction. Possible surgery this morning. EXAM: ABDOMEN - 2 VIEW COMPARISON:  02/03/2016 FINDINGS: Unchanged bowel gas pattern with small more than large bowel distention. Small bowel distention is improved compared to yesterday. Oral contrast still seen in the proximal colon, with interval emptying of the distal colon and rectum. No concerning intra-abdominal mass effect or calcification. No pneumoperitoneum. Lung bases are clear. IMPRESSION: Partial small bowel obstruction pattern. Small bowel distention is improved from yesterday. Electronically Signed   By: Monte Fantasia M.D.   On: 02/04/2016 08:05   Dg Abd 2 Views  02/03/2016  CLINICAL DATA:  Follow-up small bowel obstruction EXAM: ABDOMEN - 2 VIEW COMPARISON:  02/02/2016 FINDINGS: There is slight worsening of small bowel distension with air-fluid levels in upper abdomen consistent with partial small bowel obstruction. Again noted contrast material throughout the colon. NG tube has been removed. There is no evidence of free abdominal air. IMPRESSION: There is slight worsening small bowel distension with air-fluid levels in mid and upper abdomen consistent with at least partial small bowel obstruction. Again noted contrast material throughout the colon. NG tube has been removed. Electronically Signed   By: Lahoma Crocker M.D.   On: 02/03/2016 08:45   Dg Abd 2 Views  02/02/2016  CLINICAL DATA:  Small-bowel obstruction and abdominal pain EXAM: ABDOMEN - 2 VIEW COMPARISON:  02/01/2016 FINDINGS: Scattered large and small bowel gas is noted. Contrast material is noted throughout the colon from a recent CT. Nasogastric catheter remains within the stomach. Persistent mild small bowel dilatation is seen. The overall appearance has improved slightly in the interval from the prior exam. No free air is seen. No other  focal abnormality is noted. IMPRESSION: Slight improvement in the degree of small bowel dilatation when compared with the prior study. Electronically Signed   By: Inez Catalina M.D.   On: 02/02/2016 08:28    Medications / Allergies:  Scheduled Meds: . enoxaparin (LOVENOX) injection  40 mg Subcutaneous Q24H   Continuous Infusions: . sodium chloride 0.45 % 1,000 mL with potassium chloride 40 mEq infusion 100 mL/hr at 02/03/16 1848   PRN Meds:.ketorolac, menthol-cetylpyridinium, morphine injection, ondansetron (ZOFRAN) IV, prochlorperazine  Antibiotics: Anti-infectives    Start     Dose/Rate Route Frequency Ordered Stop   02/04/16 0600  cefOXitin (MEFOXIN) 2 g in dextrose 5 % 50 mL IVPB  Status:  Discontinued    Comments:  Pharmacy may adjust dosing strength, interval, or rate of medication as needed for optimal therapy for the patient Send with patient on call to the OR.  Anesthesia to complete antibiotic administration <48mn [facility]   2 g 100 mL/hr over 30 Minutes Intravenous On call to O.R. 02/03/16 1344 02/04/16 0812   01/29/16 0645  metroNIDAZOLE (FLAGYL) IVPB 500 mg  Status:  Discontinued     500 mg 100 mL/hr over 60 Minutes Intravenous 3 times per day 01/29/16 0633 02/03/16 1344   01/29/16 0645  ciprofloxacin (CIPRO) IVPB 400 mg  Status:  Discontinued     400 mg 200 mL/hr over 60 Minutes Intravenous 2 times daily 01/29/16 0583402/22/17 1344        Assessment/Plan Abdominal pain-still unclear whether she had a mechanical obstruction or whether this is a motility problem.  Abdominal xrays are much improved today compared to yesterday, significantly less distended and soft on exam, having bowel movements.  We will there hold off on surgery.  STRONGLY encouraged minimizing narcotic use as this may be the etiology of her problem.  Resume clears, AXR in AM, encourage mobilization.    EErby Pian [facility] Pager  [phone](7A-4:30P)   02/04/2016 8:20 AM

## 2016-02-05 ENCOUNTER — Inpatient Hospital Stay (HOSPITAL_COMMUNITY): Payer: Medicaid Other

## 2016-02-05 DIAGNOSIS — D509 Iron deficiency anemia, unspecified: Secondary | ICD-10-CM | POA: Insufficient documentation

## 2016-02-05 LAB — CBC
HCT: 30.9 % — ABNORMAL LOW (ref 36.0–46.0)
Hemoglobin: 9.8 g/dL — ABNORMAL LOW (ref 12.0–15.0)
MCH: 28.9 pg (ref 26.0–34.0)
MCHC: 31.7 g/dL (ref 30.0–36.0)
MCV: 91.2 fL (ref 78.0–100.0)
Platelets: 262 10*3/uL (ref 150–400)
RBC: 3.39 MIL/uL — ABNORMAL LOW (ref 3.87–5.11)
RDW: 16.5 % — ABNORMAL HIGH (ref 11.5–15.5)
WBC: 5.8 10*3/uL (ref 4.0–10.5)

## 2016-02-05 LAB — BASIC METABOLIC PANEL
Anion gap: 11 (ref 5–15)
BUN: <5 mg/dL — ABNORMAL LOW (ref 6–20)
CO2: 20 mmol/L — ABNORMAL LOW (ref 22–32)
Calcium: 9.1 mg/dL (ref 8.9–10.3)
Chloride: 107 mmol/L (ref 101–111)
Creatinine, Ser: 0.62 mg/dL (ref 0.44–1.00)
GFR calc Af Amer: >60 mL/min (ref >60)
GFR calc non Af Amer: >60 mL/min (ref >60)
Glucose, Bld: 79 mg/dL (ref 65–99)
Potassium: 5 mmol/L (ref 3.5–5.1)
Sodium: 138 mmol/L (ref 135–145)

## 2016-02-05 LAB — MAGNESIUM: Magnesium: 1.8 mg/dL (ref 1.7–2.4)

## 2016-02-05 MED ORDER — TRAMADOL HCL ER 100 MG PO TB24: 100.0000 mg | ORAL_TABLET | Freq: Four times a day (QID) | ORAL | Status: DC | PRN

## 2016-02-05 NOTE — Progress Notes (Signed)
Patient ID: [name], female   DOB: January 11, 1976, 40 y.o.   MRN: [id number]     [facility]      [address] [place], [place] [address]    Phone: [phone] FAX: [phone]     Subjective: No n/v.  Tolerating fulls.  Having diarrhea.   Objective:  Vital signs:  Filed Vitals:   02/04/16 0508 02/04/16 1435 02/04/16 2145 02/05/16 0630  BP: 123/64 127/63 142/74 145/78  Pulse: 63 63 67 67  Temp: 98.9 F (37.2 C) 98.9 F (37.2 C) 99.1 F (37.3 C) 99 F (37.2 C)  TempSrc: Oral Oral Oral Oral  Resp: 16 16 18 18   Height:      Weight:      SpO2: 100% 100% 100% 100%    Last BM Date: 02/05/16  Intake/Output   Yesterday:  02/23 0701 - 02/24 0700 In: 2570 [P.O.:180; I.V.:2390] Out: 0  This shift:       Physical Exam: General: Pt awake/alert/oriented x4 in no acute distress  Abdomen: Soft.  Mild distention. ttp rlq.  No evidence of peritonitis.  No incarcerated hernias.    Problem List:   Principal Problem:   SBO (small bowel obstruction) (HCC) Active Problems:   Abdominal pain   Nausea with vomiting   Small bowel obstruction (HCC)   Hypokalemia   Anemia    Results:   Labs: Results for orders placed or performed during the hospital encounter of 01/28/16 (from the past 48 hour(s))  Magnesium     Status: None   Collection Time: 02/04/16  4:35 AM  Result Value Ref Range   Magnesium 2.2 1.7 - 2.4 mg/dL  CBC     Status: Abnormal   Collection Time: 02/04/16  4:35 AM  Result Value Ref Range   WBC 6.0 4.0 - 10.5 K/uL   RBC 3.41 (L) 3.87 - 5.11 MIL/uL   Hemoglobin 9.9 (L) 12.0 - 15.0 g/dL   HCT 31.0 (L) 36.0 - 46.0 %   MCV 90.9 78.0 - 100.0 fL   MCH 29.0 26.0 - 34.0 pg   MCHC 31.9 30.0 - 36.0 g/dL   RDW 16.4 (H) 11.5 - 15.5 %   Platelets 252 150 - 400 K/uL  Basic metabolic panel     Status: Abnormal   Collection Time: 02/04/16  4:35 AM  Result Value Ref Range   Sodium 133 (L) 135 - 145 mmol/L   Potassium 4.8 3.5 - 5.1 mmol/L    Comment: DELTA CHECK NOTED REPEATED TO VERIFY    Chloride 105 101 - 111 mmol/L   CO2 19 (L) 22 - 32 mmol/L   Glucose, Bld 79 65 - 99 mg/dL   BUN <5 (L) 6 - 20 mg/dL   Creatinine, Ser 0.63 0.44 - 1.00 mg/dL   Calcium 8.8 (L) 8.9 - 10.3 mg/dL   GFR calc non Af Amer >60 >60 mL/min   GFR calc Af Amer >60 >60 mL/min    Comment: (NOTE) The eGFR has been calculated using the CKD EPI equation. This calculation has not been validated in all clinical situations. eGFR's persistently <60 mL/min signify possible Chronic Kidney Disease.    Anion gap 9 5 - 15  MRSA PCR Screening     Status: None   Collection Time: 02/04/16  6:35 AM  Result Value Ref Range   MRSA by PCR NEGATIVE NEGATIVE    Comment:        The GeneXpert MRSA Assay (FDA  approved for NASAL specimens only), is one component of a comprehensive MRSA colonization surveillance program. It is not intended to diagnose MRSA infection nor to guide or monitor treatment for MRSA infections.   CBC     Status: Abnormal   Collection Time: 02/05/16  4:38 AM  Result Value Ref Range   WBC 5.8 4.0 - 10.5 K/uL   RBC 3.39 (L) 3.87 - 5.11 MIL/uL   Hemoglobin 9.8 (L) 12.0 - 15.0 g/dL   HCT 30.9 (L) 36.0 - 46.0 %   MCV 91.2 78.0 - 100.0 fL   MCH 28.9 26.0 - 34.0 pg   MCHC 31.7 30.0 - 36.0 g/dL   RDW 16.5 (H) 11.5 - 15.5 %   Platelets 262 150 - 400 K/uL  Basic metabolic panel     Status: Abnormal   Collection Time: 02/05/16  4:38 AM  Result Value Ref Range   Sodium 138 135 - 145 mmol/L   Potassium 5.0 3.5 - 5.1 mmol/L   Chloride 107 101 - 111 mmol/L   CO2 20 (L) 22 - 32 mmol/L   Glucose, Bld 79 65 - 99 mg/dL   BUN <5 (L) 6 - 20 mg/dL   Creatinine, Ser 0.62 0.44 - 1.00 mg/dL   Calcium 9.1 8.9 - 10.3 mg/dL   GFR calc non Af Amer >60 >60 mL/min   GFR calc Af Amer >60 >60 mL/min    Comment: (NOTE) The eGFR has been calculated using the CKD EPI equation. This calculation has not been validated in all  clinical situations. eGFR's persistently <60 mL/min signify possible Chronic Kidney Disease.    Anion gap 11 5 - 15  Magnesium     Status: None   Collection Time: 02/05/16  4:38 AM  Result Value Ref Range   Magnesium 1.8 1.7 - 2.4 mg/dL    Imaging / Studies: Dg Abd 2 Views  02/05/2016  CLINICAL DATA:  Follow-up small bowel obstruction EXAM: ABDOMEN - 2 VIEW COMPARISON:  Supine and upright abdominal films of February 04, 2016 FINDINGS: There remain loops of fluid and gas-filled small bowel centered in the mid and left aspect of the abdomen. Contrast and gas are present within the ascending and proximal transverse colons as well as the rectosigmoid. No free extraluminal gas collections are observed. The bony structures exhibit no acute abnormality. There is stable curvature of the thoracic and upper lumbar spine with convexity toward the right. IMPRESSION: Persistent relatively high-grade mid to distal small bowel obstruction. Previously DIS administered oral contrast has transited more of the colon. There is no evidence of obstruction. Electronically Signed   By: David  Martinique M.D.   On: 02/05/2016 07:22   Dg Abd 2 Views  02/04/2016  CLINICAL DATA:  Small bowel obstruction. Possible surgery this morning. EXAM: ABDOMEN - 2 VIEW COMPARISON:  02/03/2016 FINDINGS: Unchanged bowel gas pattern with small more than large bowel distention. Small bowel distention is improved compared to yesterday. Oral contrast still seen in the proximal colon, with interval emptying of the distal colon and rectum. No concerning intra-abdominal mass effect or calcification. No pneumoperitoneum. Lung bases are clear. IMPRESSION: Partial small bowel obstruction pattern. Small bowel distention is improved from yesterday. Electronically Signed   By: Monte Fantasia M.D.   On: 02/04/2016 08:05    Medications / Allergies:  Scheduled Meds: . enoxaparin (LOVENOX) injection  40 mg Subcutaneous Q24H   Continuous Infusions:  PRN  Meds:.ketorolac, menthol-cetylpyridinium, morphine injection, ondansetron (ZOFRAN) IV, prochlorperazine  Antibiotics: Anti-infectives  Start     Dose/Rate Route Frequency Ordered Stop   02/04/16 0600  cefOXitin (MEFOXIN) 2 g in dextrose 5 % 50 mL IVPB  Status:  Discontinued    Comments:  Pharmacy may adjust dosing strength, interval, or rate of medication as needed for optimal therapy for the patient Send with patient on call to the OR.  Anesthesia to complete antibiotic administration <18mn [facility]   2 g 100 mL/hr over 30 Minutes Intravenous On call to O.R. 02/03/16 1344 02/04/16 0812   01/29/16 0645  metroNIDAZOLE (FLAGYL) IVPB 500 mg  Status:  Discontinued     500 mg 100 mL/hr over 60 Minutes Intravenous 3 times per day 01/29/16 0633 02/03/16 1344   01/29/16 0645  ciprofloxacin (CIPRO) IVPB 400 mg  Status:  Discontinued     400 mg 200 mL/hr over 60 Minutes Intravenous 2 times daily 01/29/16 0900902/22/17 1344        Assessment/Plan Abdominal pain-advance to a soft diet, if able to tolerate, discharge home. Strongly encourage minimizing narcotics as their was be at the root of her problem.  Discussed with Dr. HExcell Seltzer     EErby Pian [facility] Center[facility] Pager [phone](7A-4:30P)   02/05/2016 11:59 AM

## 2016-02-05 NOTE — Discharge Summary (Signed)
Patient discharged home with family, VVS, NAD

## 2016-02-05 NOTE — Discharge Summary (Signed)
Physician Discharge Summary  [name] [id number] DOB: Jun 01, 1976 DOA: 01/28/2016  PCP: [facility]  Admit date: 01/28/2016 Discharge date: 02/05/2016  Time spent: 65 minutes  Recommendations for Outpatient Follow-up:  1. Patient be discharged home. Patient is to follow-up with [facility] in 1-2 weeks. On follow-up patient will need a basic metabolic profile done to follow-up on electrolytes and renal function.   Discharge Diagnoses:  Principal Problem:   SBO (small bowel obstruction) (HCC) Active Problems:   Abdominal pain   Nausea with vomiting   Small bowel obstruction (HCC)   Hypokalemia   Anemia   Discharge Condition: Stable and improved.  Diet recommendation: Regular/soft diet.  Filed Weights   01/29/16 [id number]  Weight: 75.705 kg (166 lb 14.4 oz)    History of present illness:  Per Dr Tamala Julian Ms. [name] is a 40 year old female with a past medical history significant for C-section 4, iron deficiency anemia, uterine fibroids; who presented with complaints of abdominal pain for the last month. The pain is constant and achy in nature. She reported trying laxatives and other remedies without success. Associated symptoms include nausea, vomiting, shortness of breath, constipation, and diarrhea. Denied dysuria,, frequency, chest pain, chills, or fever. This is the patient's fifth evaluation for abdominal pain since 12/29/2015. Previously evaluated and discharged home with previous diagnosis being constipation.  Upon admission patient was evaluated with a abdominal x-ray which showed signs of small bowel obstruction tonight subsequent CT scan of the abdomen revealed also signs of a small bowel obstruction with possible signs of colitis.    Hospital Course:  #1 small bowel obstruction Patient was admitted with concerns for small bowel obstruction. Patient was placed on bowel rest, IV fluids, supportive care. General surgery was consulted and followed the patient  throughout the hospitalization. It was felt narcotics may have been playing a role in it was recommended that patient decrease narcotic dose use. Patient was subsequently started on clear liquids. Patient developed abdominal pain and distention.NG tube was placed and subsequently removed. Patient's abdominal distention had worsened 1 day prior to admission and patient placed back on bowel rest, as abdominal films showed worsening. Patient was followed. Patient improved clinically had less abdominal distention as abdominal pain and was passing gas and having bowel movements. Patient initially had been placed empirically on IV antibiotics due to concern for colitis. IV antibiotics were subsequently discontinued by general surgery. Patient improved clinically and patient will be discharged home in stable and improved condition will follow-up with PCP as outpatient.   #2 hypokalemia/hypomagnesemia General hospitalization patient was noted to be hypokalemic and hypomagnesemic. Patient's electrolytes were repleted and hypokalemia and hypomagnesemia had resolved by day of discharge.  #3 chronic iron deficiency anemia Secondary to heavy menstrual periods and fibroids. Hemoglobin remained stable. Outpatient follow-up.   Procedures:  Abdominal x-rays 01/30/2016, 01/31/2016, 02/01/2016, 02/02/2016, 02/03/2016, 02/04/2016  CT abdomen and pelvis 01/29/2016    Consultations:  Gen. surgery: Dr. Alvino Blood 01/29/2016  Discharge Exam: Filed Vitals:   02/04/16 2145 02/05/16 0630  BP: 142/74 145/78  Pulse: 67 67  Temp: 99.1 F (37.3 C) 99 F (37.2 C)  Resp: 18 18    General: NAD Cardiovascular: RRR Respiratory: CTAB  Discharge Instructions   Discharge Instructions    Diet general    Complete by:  As directed      Discharge instructions    Complete by:  As directed   Follow up with [facility] in 1-2 weeks. Minimize narcotic pain medications.  Increase activity slowly    Complete by:   As directed           Current Discharge Medication List    START taking these medications   Details  traMADol (ULTRAM-ER) 100 MG 24 hr tablet Take 1 tablet (100 mg total) by mouth every 6 (six) hours as needed for pain. Qty: 30 tablet, Refills: 0      CONTINUE these medications which have NOT CHANGED   Details  polyethylene glycol powder (GLYCOLAX/MIRALAX) powder 1 capful daily as needed for constipation Qty: 255 g, Refills: 0    saccharomyces boulardii (FLORASTOR) 250 MG capsule Take 1 capsule (250 mg total) by mouth 2 (two) times daily. Qty: 60 capsule, Refills: 2      STOP taking these medications     bisacodyl (DULCOLAX) 10 MG suppository      ibuprofen (ADVIL,MOTRIN) 800 MG tablet      levofloxacin (LEVAQUIN) 750 MG tablet      metroNIDAZOLE (FLAGYL) 500 MG tablet        No Known Allergies Follow-up Information    Follow up with [facility]. Schedule an appointment as soon as possible for a visit in 1 week.   Specialty:  Internal Medicine   Why:  f/u in 1-2 weeks.   Contact information:   [address] [place] [address] [phone]        The results of significant diagnostics from this hospitalization (including imaging, microbiology, ancillary and laboratory) are listed below for reference.    Significant Diagnostic Studies: Dg Abd 1 View  01/10/2016  CLINICAL DATA:  Abdominal pain, constipation for 4 days EXAM: ABDOMEN - 1 VIEW COMPARISON:  01/09/2016 FINDINGS: Mild gaseous distention of large and small bowel. Decreasing distention of small bowel. Favor mild ileus. No organomegaly or free air. No suspicious calcification. No acute bony abnormality. IMPRESSION: Improving bowel gas pattern with decreasing gaseous distention of small bowel. Mild gaseous distention of large and small bowel loops likely reflect ileus. Electronically Signed   By: Rolm Baptise M.D.   On: 01/10/2016 07:09   Ct Abdomen Pelvis W Contrast  01/29/2016  CLINICAL DATA:   Lower abdominal pain, nausea and vomiting, and diarrhea for 2 weeks. EXAM: CT ABDOMEN AND PELVIS WITH CONTRAST TECHNIQUE: Multidetector CT imaging of the abdomen and pelvis was performed using the standard protocol following bolus administration of intravenous contrast. CONTRAST:  116mL OMNIPAQUE IOHEXOL 300 MG/ML  SOLN COMPARISON:  01/08/2016 FINDINGS: Mild dependent changes in the lung bases. The liver, spleen, gallbladder, pancreas, adrenal glands, abdominal aorta, inferior vena cava, and retroperitoneal lymph nodes are unremarkable. Cyst in the right kidney. Renal nephrograms are symmetrical. No hydronephrosis. Stomach is decompressed. Dilated fluid-filled small bowel with decompressed terminal ileum. Transition zone is difficult to localize but appears to be in the left lower abdomen. Cause is indeterminate. The colon is decompressed but there is evidence of edema and stranding in the fat around the colon which may indicate changes of colitis. No free air in the abdomen. No pneumatosis or portal venous gas. Pelvis: The uterus is enlarged suggesting recent postpartum state. Anterior defect in the lower uterine wall suggesting a C-section scar. Small amount of free fluid in the pelvis is likely reactive. Bladder wall is mildly thickened suggesting possible cystitis. Mild degenerative changes in the spine. No destructive bone lesions. IMPRESSION: Dilated fluid-filled small bowel with decompressed distal small bowel and transition zone probably in the left abdomen. Changes are consistent with small bowel obstruction of nonspecific etiology.  Colon is decompressed but there is evidence of fluid and infiltration around the colon suggesting colitis. Small amount of free fluid in the pelvis is likely reactive. Bladder wall thickening suggests cystitis. Presumed recent postpartum changes in the uterus consistent with C-section. Electronically Signed   By: Lucienne Capers M.D.   On: 01/29/2016 04:08   Ct Abdomen Pelvis  W Contrast  01/08/2016  CLINICAL DATA:  Abdominal pain and distention. No bowel movement for 1 week. EXAM: CT ABDOMEN AND PELVIS WITH CONTRAST TECHNIQUE: Multidetector CT imaging of the abdomen and pelvis was performed using the standard protocol following bolus administration of intravenous contrast. CONTRAST:  86mL OMNIPAQUE IOHEXOL 300 MG/ML SOLN, 138mL OMNIPAQUE IOHEXOL 300 MG/ML SOLN COMPARISON:  MRI pelvis 01/06/2016. CT abdomen and pelvis 04/30/2015. FINDINGS: Airspace infiltrative changes in both lung bases, greater on the right. This may represent atelectasis or multifocal pneumonia. The liver, spleen, gallbladder, pancreas, adrenal glands, abdominal aorta, inferior vena cava, and retroperitoneal lymph nodes are unremarkable. Cyst in the anterior right kidney. No hydronephrosis in either kidney. Stomach is decompressed. Small bowel are decompressed. Contrast material flows through to the colon without evidence of bowel obstruction. No free air or free fluid in the abdomen. Colon is diffusely stool-filled. There does appear to be colonic wall thickening involving the transverse colon and hepatic flexure although this could just be due to adherent stool. Colitis is not excluded. Pelvis: Uterus and ovaries are not enlarged. Endometrial stripe is somewhat thickened and fluid-filled. Calcification in the anterior uterus possibly representing small fibroid. No evidence of diverticulitis. No free or loculated pelvic fluid collections. No pelvic mass or lymphadenopathy. Appendix is not identified. IMPRESSION: Diffusely stool-filled colon suggesting constipation. No evidence of bowel obstruction. Possible thickening of the wall of the right colon and hepatic flexure. This may be due to adherent stool but colitis is not excluded. Airspace infiltration in the lung bases suggest pneumonia or less likely atelectasis. Endometrial cavity is prominent and fluid-filled. Possibly physiologic. Electronically Signed   By:  Lucienne Capers M.D.   On: 01/08/2016 02:20   Dg Abd 2 Views  02/05/2016  CLINICAL DATA:  Follow-up small bowel obstruction EXAM: ABDOMEN - 2 VIEW COMPARISON:  Supine and upright abdominal films of February 04, 2016 FINDINGS: There remain loops of fluid and gas-filled small bowel centered in the mid and left aspect of the abdomen. Contrast and gas are present within the ascending and proximal transverse colons as well as the rectosigmoid. No free extraluminal gas collections are observed. The bony structures exhibit no acute abnormality. There is stable curvature of the thoracic and upper lumbar spine with convexity toward the right. IMPRESSION: Persistent relatively high-grade mid to distal small bowel obstruction. Previously DIS administered oral contrast has transited more of the colon. There is no evidence of obstruction. Electronically Signed   By: David  Martinique M.D.   On: 02/05/2016 07:22   Dg Abd 2 Views  02/04/2016  CLINICAL DATA:  Small bowel obstruction. Possible surgery this morning. EXAM: ABDOMEN - 2 VIEW COMPARISON:  02/03/2016 FINDINGS: Unchanged bowel gas pattern with small more than large bowel distention. Small bowel distention is improved compared to yesterday. Oral contrast still seen in the proximal colon, with interval emptying of the distal colon and rectum. No concerning intra-abdominal mass effect or calcification. No pneumoperitoneum. Lung bases are clear. IMPRESSION: Partial small bowel obstruction pattern. Small bowel distention is improved from yesterday. Electronically Signed   By: Monte Fantasia M.D.   On: 02/04/2016 08:05   Dg  Abd 2 Views  02/03/2016  CLINICAL DATA:  Follow-up small bowel obstruction EXAM: ABDOMEN - 2 VIEW COMPARISON:  02/02/2016 FINDINGS: There is slight worsening of small bowel distension with air-fluid levels in upper abdomen consistent with partial small bowel obstruction. Again noted contrast material throughout the colon. NG tube has been removed.  There is no evidence of free abdominal air. IMPRESSION: There is slight worsening small bowel distension with air-fluid levels in mid and upper abdomen consistent with at least partial small bowel obstruction. Again noted contrast material throughout the colon. NG tube has been removed. Electronically Signed   By: Lahoma Crocker M.D.   On: 02/03/2016 08:45   Dg Abd 2 Views  02/02/2016  CLINICAL DATA:  Small-bowel obstruction and abdominal pain EXAM: ABDOMEN - 2 VIEW COMPARISON:  02/01/2016 FINDINGS: Scattered large and small bowel gas is noted. Contrast material is noted throughout the colon from a recent CT. Nasogastric catheter remains within the stomach. Persistent mild small bowel dilatation is seen. The overall appearance has improved slightly in the interval from the prior exam. No free air is seen. No other focal abnormality is noted. IMPRESSION: Slight improvement in the degree of small bowel dilatation when compared with the prior study. Electronically Signed   By: Inez Catalina M.D.   On: 02/02/2016 08:28   Dg Abd 2 Views  02/01/2016  CLINICAL DATA:  Small bowel obstruction EXAM: ABDOMEN - 2 VIEW COMPARISON:  01/31/2016 FINDINGS: NG tube remains in stable position. Oral contrast material noted within the colon. Dilated small bowel loops in the mid abdomen compatible with partial small bowel obstruction. No free air organomegaly. IMPRESSION: Continued partial small bowel obstruction pattern.  No real change. Electronically Signed   By: Rolm Baptise M.D.   On: 02/01/2016 09:03   Dg Abd 2 Views  01/31/2016  CLINICAL DATA:  PT C/O CONTINUED ABD PAIN, WEAKNESS, STATES SHE HAS HAD CONSTIPATION X 1 WK, PRIOR SBO, PT HAD BM IN THE NIGHT. EXAM: ABDOMEN - 2 VIEW COMPARISON:  01/30/2016 FINDINGS: Nasogastric tube is in place, tip overlying the level of the distal stomach. Gush persistent mild dilatation of small bowel loops the dilatation has improved. There has been some progression of contrast from small bowel  loops to the colon. Air-fluid levels versus within mildly dilated loops of small bowel, also slightly improved. IMPRESSION: 1. Decreased small bowel dilatation. 2. Significant residual contrast, now progressed into distal colon. Electronically Signed   By: Nolon Nations M.D.   On: 01/31/2016 10:38   Dg Abd 2 Views  01/30/2016  CLINICAL DATA:  40 year old female with lower abdominal pain, nausea and low-grade fever for 1 week. Small bowel obstruction. EXAM: ABDOMEN - 2 VIEW COMPARISON:  Prior abdominal radiograph 01/29/2016 FINDINGS: Nasogastric tube remains in good position. Persistent diffuse dilatation of multiple loops of small bowel with air-fluid levels in the upper abdomen. There has been mild interval distal progression of contrast material into the ascending colon to the level of the hepatic flexure. The lung bases are clear. No evidence of free air. IMPRESSION: 1. Persistent partial small bowel obstruction without significant interval change. 2. Slight continued progression of contrast material into the ascending colon. Electronically Signed   By: Jacqulynn Cadet M.D.   On: 01/30/2016 09:23   Dg Abd 2 Views  01/29/2016  CLINICAL DATA:  Lower abdominal pain, nausea, vomiting and diarrhea for 2 days. EXAM: ABDOMEN - 2 VIEW COMPARISON:  Abdominal radiographs January 12, 2016 FINDINGS: Scattered mildly gas distended small bowel  air-fluid levels, paucity of large bowel gas. No intra-abdominal mass effect or pathologic calcification. Phleboliths LEFT pelvis. Lung bases are clear. Lower thoracic dextroscoliosis. IMPRESSION: Small bowel air-fluid levels, paucity of large bowel gas concerning for small bowel obstruction. Electronically Signed   By: Elon Alas M.D.   On: 01/29/2016 01:42   Dg Abd 2 Views  01/12/2016  CLINICAL DATA:  Low abdominal pain and constipation for the last week. EXAM: ABDOMEN - 2 VIEW COMPARISON:  01/10/2016 FINDINGS: The bowel gas pattern is nonobstructive. Multiple gas  fluid levels are seen within non pathologically distended small and large bowel loops. No radiographic evidence of organomegaly. There is no evidence of free air. No radio-opaque calculi or other significant radiographic abnormality is seen. IMPRESSION: Multiple gas fluid levels within non pathologically distended small and large bowel loops. Findings are suggestive of enteritis/colitis. Electronically Signed   By: Fidela Salisbury M.D.   On: 01/12/2016 07:40   Dg Abd Acute W/chest  01/09/2016  CLINICAL DATA:  40 year old female with right abdominal pain, nausea and vomiting for 2 days. EXAM: DG ABDOMEN ACUTE W/ 1V CHEST COMPARISON:  01/08/2016 CT and prior exams. FINDINGS: The cardiomediastinal silhouette is unremarkable. There is no evidence of airspace disease, pleural effusion or pneumothorax. Now identified are mildly distended loops of small bowel with air-fluid levels. There is no evidence of pneumoperitoneum. Small amount of gas in the colon is noted. IMPRESSION: New mildly distended small bowel loops with air-fluid levels. This may represent an ileus or possibly developing small bowel obstruction. No evidence of pneumoperitoneum. No evidence of acute cardiopulmonary disease. Electronically Signed   By: Margarette Canada M.D.   On: 01/09/2016 13:56   Dg Abd Portable 1v-small Bowel Obstruction Protocol-initial, 8 Hr Delay  01/29/2016  CLINICAL DATA:  Small bowel obstruction. Oral contrast given at 10:20 this morning via the patient's nasogastric tube. EXAM: PORTABLE ABDOMEN - 1 VIEW COMPARISON:  Abdomen radiographs and abdomen pelvis CT obtained earlier today. FINDINGS: Oral contrast in multiple mildly dilated small bowel loops. There is also oral contrast in normal caliber distal small bowel loops, right colon and hepatic flexure. Excreted contrast in the urinary bladder. Nasogastric tube tip in the distal stomach. Mild scoliosis. IMPRESSION: Stable partial small bowel obstruction. Electronically Signed    By: Claudie Revering M.D.   On: 01/29/2016 18:44    Microbiology: Recent Results (from the past 240 hour(s))  Wet prep, genital     Status: Abnormal   Collection Time: 01/29/16 12:11 AM  Result Value Ref Range Status   Yeast Wet Prep HPF POC NONE SEEN NONE SEEN Final   Trich, Wet Prep NONE SEEN NONE SEEN Final   Clue Cells Wet Prep HPF POC PRESENT (A) NONE SEEN Final   WBC, Wet Prep HPF POC MANY (A) NONE SEEN Final   Sperm NONE SEEN  Final  MRSA PCR Screening     Status: None   Collection Time: 02/04/16  6:35 AM  Result Value Ref Range Status   MRSA by PCR NEGATIVE NEGATIVE Final    Comment:        The GeneXpert MRSA Assay (FDA approved for NASAL specimens only), is one component of a comprehensive MRSA colonization surveillance program. It is not intended to diagnose MRSA infection nor to guide or monitor treatment for MRSA infections.      Labs: Basic Metabolic Panel:  Recent Labs Lab 01/30/16 0449 02/02/16 0422 02/03/16 0443 02/04/16 0435 02/05/16 0438  NA 139 139 138 133* 138  K  4.2 3.5 3.4* 4.8 5.0  CL 109 102 102 105 107  CO2 23 24 26  19* 20*  GLUCOSE 88 91 118* 79 79  BUN 5* <5* <5* <5* <5*  CREATININE 0.55 0.64 0.65 0.63 0.62  CALCIUM 8.4* 9.0 9.2 8.8* 9.1  MG 2.1  --  1.6* 2.2 1.8   Liver Function Tests: No results for input(s): AST, ALT, ALKPHOS, BILITOT, PROT, ALBUMIN in the last 168 hours. No results for input(s): LIPASE, AMYLASE in the last 168 hours. No results for input(s): AMMONIA in the last 168 hours. CBC:  Recent Labs Lab 01/30/16 0449 02/03/16 0443 02/04/16 0435 02/05/16 0438  WBC 7.3 5.8 6.0 5.8  HGB 9.0* 9.5* 9.9* 9.8*  HCT 28.2* 30.1* 31.0* 30.9*  MCV 91.3 90.1 90.9 91.2  PLT 258 224 252 262   Cardiac Enzymes: No results for input(s): CKTOTAL, CKMB, CKMBINDEX, TROPONINI in the last 168 hours. BNP: BNP (last 3 results) No results for input(s): BNP in the last 8760 hours.  ProBNP (last 3 results) No results for input(s):  PROBNP in the last 8760 hours.  CBG: No results for input(s): GLUCAP in the last 168 hours.     SignedIrine Seal MD.  [facility] 02/05/2016, 1:37 PM

## 2016-02-05 NOTE — Care Management Note (Signed)
Case Management Note  Patient Details  Name: [name] MRN: [id number] Date of Birth: Jan 19, 1976  Subjective/Objective:    Admitted with SBO                Action/Plan: Discharge planning, no HH needs identified  Expected Discharge Date:   (unknown)               Expected Discharge Plan:  Home/Self Care  In-House Referral:  NA  Discharge planning Services  CM Consult  Post Acute Care Choice:  NA Choice offered to:  NA  DME Arranged:  N/A DME Agency:  NA  HH Arranged:  NA HH Agency:  NA  Status of Service:  Completed, signed off  Medicare Important Message Given:    Date Medicare IM Given:    Medicare IM give by:    Date Additional Medicare IM Given:    Additional Medicare Important Message give by:     If discussed at [place] of Stay Meetings, dates discussed:    Additional Comments:  Guadalupe Maple, RN 02/05/2016, 10:34 AM [phone]

## 2016-02-07 ENCOUNTER — Emergency Department (HOSPITAL_COMMUNITY): Payer: Medicaid Other

## 2016-02-07 ENCOUNTER — Inpatient Hospital Stay (HOSPITAL_COMMUNITY)
Admission: EM | Admit: 2016-02-07 | Discharge: 2016-02-11 | DRG: 390 | Disposition: A | Discharge status: Home / Self Care | Payer: Medicaid Other | Attending: Internal Medicine | Admitting: Internal Medicine

## 2016-02-07 ENCOUNTER — Encounter (HOSPITAL_COMMUNITY): Payer: Self-pay | Admitting: Emergency Medicine

## 2016-02-07 DIAGNOSIS — D509 Iron deficiency anemia, unspecified: Secondary | ICD-10-CM | POA: Diagnosis present

## 2016-02-07 DIAGNOSIS — Z0189 Encounter for other specified special examinations: Secondary | ICD-10-CM

## 2016-02-07 DIAGNOSIS — K567 Ileus, unspecified: Secondary | ICD-10-CM | POA: Diagnosis present

## 2016-02-07 DIAGNOSIS — D259 Leiomyoma of uterus, unspecified: Secondary | ICD-10-CM | POA: Diagnosis present

## 2016-02-07 DIAGNOSIS — K5660 Unspecified intestinal obstruction: Principal | ICD-10-CM | POA: Diagnosis present

## 2016-02-07 DIAGNOSIS — T402X5A Adverse effect of other opioids, initial encounter: Secondary | ICD-10-CM

## 2016-02-07 DIAGNOSIS — F1190 Opioid use, unspecified, uncomplicated: Secondary | ICD-10-CM

## 2016-02-07 DIAGNOSIS — F17210 Nicotine dependence, cigarettes, uncomplicated: Secondary | ICD-10-CM | POA: Diagnosis present

## 2016-02-07 DIAGNOSIS — K56609 Unspecified intestinal obstruction, unspecified as to partial versus complete obstruction: Secondary | ICD-10-CM

## 2016-02-07 DIAGNOSIS — G8929 Other chronic pain: Secondary | ICD-10-CM | POA: Diagnosis present

## 2016-02-07 DIAGNOSIS — R509 Fever, unspecified: Secondary | ICD-10-CM

## 2016-02-07 DIAGNOSIS — Z720 Tobacco use: Secondary | ICD-10-CM | POA: Diagnosis present

## 2016-02-07 DIAGNOSIS — N920 Excessive and frequent menstruation with regular cycle: Secondary | ICD-10-CM | POA: Diagnosis present

## 2016-02-07 DIAGNOSIS — K5903 Drug induced constipation: Secondary | ICD-10-CM | POA: Diagnosis present

## 2016-02-07 DIAGNOSIS — F1110 Opioid abuse, uncomplicated: Secondary | ICD-10-CM | POA: Diagnosis present

## 2016-02-07 LAB — CBC WITH DIFFERENTIAL/PLATELET
Basophils Absolute: 0 10*3/uL (ref 0.0–0.1)
Basophils Relative: 0 %
Eosinophils Absolute: 0.1 10*3/uL (ref 0.0–0.7)
Eosinophils Relative: 1 %
HCT: 33.5 % — ABNORMAL LOW (ref 36.0–46.0)
Hemoglobin: 10.8 g/dL — ABNORMAL LOW (ref 12.0–15.0)
Lymphocytes Relative: 15 %
Lymphs Abs: 1.3 10*3/uL (ref 0.7–4.0)
MCH: 27.9 pg (ref 26.0–34.0)
MCHC: 32.2 g/dL (ref 30.0–36.0)
MCV: 86.6 fL (ref 78.0–100.0)
Monocytes Absolute: 0.7 10*3/uL (ref 0.1–1.0)
Monocytes Relative: 8 %
Neutro Abs: 7.1 10*3/uL (ref 1.7–7.7)
Neutrophils Relative %: 76 %
Platelets: 233 10*3/uL (ref 150–400)
RBC: 3.87 MIL/uL (ref 3.87–5.11)
RDW: 15.7 % — ABNORMAL HIGH (ref 11.5–15.5)
WBC: 9.2 10*3/uL (ref 4.0–10.5)

## 2016-02-07 LAB — COMPREHENSIVE METABOLIC PANEL
ALT: 9 U/L — ABNORMAL LOW (ref 14–54)
AST: 16 U/L (ref 15–41)
Albumin: 3.9 g/dL (ref 3.5–5.0)
Alkaline Phosphatase: 46 U/L (ref 38–126)
Anion gap: 11 (ref 5–15)
BUN: <5 mg/dL — ABNORMAL LOW (ref 6–20)
CO2: 24 mmol/L (ref 22–32)
Calcium: 9.4 mg/dL (ref 8.9–10.3)
Chloride: 101 mmol/L (ref 101–111)
Creatinine, Ser: 0.75 mg/dL (ref 0.44–1.00)
GFR calc Af Amer: >60 mL/min (ref >60)
GFR calc non Af Amer: >60 mL/min (ref >60)
Glucose, Bld: 113 mg/dL — ABNORMAL HIGH (ref 65–99)
Potassium: 3.6 mmol/L (ref 3.5–5.1)
Sodium: 136 mmol/L (ref 135–145)
Total Bilirubin: 0.2 mg/dL — ABNORMAL LOW (ref 0.3–1.2)
Total Protein: 7.6 g/dL (ref 6.5–8.1)

## 2016-02-07 LAB — LIPASE, BLOOD: Lipase: 16 U/L (ref 11–51)

## 2016-02-07 MED ORDER — LORAZEPAM 2 MG/ML IJ SOLN
1.0000 mg | Freq: Once | INTRAMUSCULAR | Status: AC
  Administered 2016-02-07: 1 mg via INTRAVENOUS
  Filled 2016-02-07: qty 1

## 2016-02-07 MED ORDER — SODIUM CHLORIDE 0.9 % IV BOLUS (SEPSIS)
1000.0000 mL | Freq: Once | INTRAVENOUS | Status: AC
  Administered 2016-02-07: 1000 mL via INTRAVENOUS

## 2016-02-07 MED ORDER — SODIUM CHLORIDE 0.9 % IV SOLN
INTRAVENOUS | Status: DC
  Administered 2016-02-07: 22:00:00 via INTRAVENOUS

## 2016-02-07 MED ORDER — ONDANSETRON HCL 4 MG/2ML IJ SOLN
4.0000 mg | Freq: Once | INTRAMUSCULAR | Status: AC
  Administered 2016-02-07: 4 mg via INTRAVENOUS
  Filled 2016-02-07: qty 2

## 2016-02-07 MED ORDER — HYDROMORPHONE HCL 1 MG/ML IJ SOLN
1.0000 mg | Freq: Once | INTRAMUSCULAR | Status: AC
  Administered 2016-02-07: 1 mg via INTRAVENOUS
  Filled 2016-02-07: qty 1

## 2016-02-07 NOTE — ED Provider Notes (Signed)
CSN: [id number]     Arrival date & time 02/07/16  2139 History   First MD Initiated Contact with Patient 02/07/16 2145     Chief Complaint  Patient presents with  . Abdominal Pain     (Consider location/radiation/quality/duration/timing/severity/associated sxs/prior Treatment) HPI Comments: Patient here complaining of abdominal pain with associated nonbilious emesis along with a vagal episode prior to arrival. Was discharged from the hospital 2 days ago after admission for small bowel obstruction. Denies any fever or chills. No diarrhea noted. Endorses diffuse abdominal pain as well as some distention. Denies any urinary symptoms. Called EMS was transported here. No treatment given prior to arrival  Patient is a 40 y.o. female presenting with abdominal pain. The history is provided by the patient and a relative.  Abdominal Pain   Past Medical History  Diagnosis Date  . Heart murmur   . Fibroids   . Anemia    Past Surgical History  Procedure Laterality Date  . Cesarean section    . Tubal ligation    . Ankle surgery     Family History  Problem Relation Age of Onset  . Diabetes Maternal Grandmother   . Hypertension Maternal Aunt    Social History  Substance Use Topics  . Smoking status: Current Every Day Smoker -- 0.50 packs/day    Types: Cigarettes  . Smokeless tobacco: Never Used  . Alcohol Use: No   OB History    Gravida Para Term Preterm AB TAB SAB Ectopic Multiple Living   4 4  4      4      Review of Systems  Gastrointestinal: Positive for abdominal pain.  All other systems reviewed and are negative.     Allergies  Review of patient's allergies indicates no known allergies.  Home Medications   Prior to Admission medications   Medication Sig Start Date End Date Taking? Authorizing Provider  polyethylene glycol powder (GLYCOLAX/MIRALAX) powder 1 capful daily as needed for constipation Patient not taking: Reported on 01/28/2016 01/06/16   Luvenia Redden, PA-C   saccharomyces boulardii (FLORASTOR) 250 MG capsule Take 1 capsule (250 mg total) by mouth 2 (two) times daily. Patient not taking: Reported on 01/28/2016 01/12/16   Oswald Hillock, MD  traMADol (ULTRAM-ER) 100 MG 24 hr tablet Take 1 tablet (100 mg total) by mouth every 6 (six) hours as needed for pain. 02/05/16   Eugenie Filler, MD   BP 116/74 mmHg  Pulse 82  Temp(Src) 98.1 F (36.7 C) (Oral)  Resp 18  SpO2 100%  LMP 01/24/2016 Physical Exam  Constitutional: She is oriented to person, place, and time. She appears well-developed and well-nourished.  Non-toxic appearance. No distress.  HENT:  Head: Normocephalic and atraumatic.  Eyes: Conjunctivae, EOM and lids are normal. Pupils are equal, round, and reactive to light.  Neck: Normal range of motion. Neck supple. No tracheal deviation present. No thyroid mass present.  Cardiovascular: Normal rate, regular rhythm and normal heart sounds.  Exam reveals no gallop.   No murmur heard. Pulmonary/Chest: Effort normal and breath sounds normal. No stridor. No respiratory distress. She has no decreased breath sounds. She has no wheezes. She has no rhonchi. She has no rales.  Abdominal: Soft. Normal appearance and bowel sounds are normal. She exhibits distension. There is generalized tenderness. There is guarding. There is no rigidity, no rebound and no CVA tenderness.  Musculoskeletal: Normal range of motion. She exhibits no edema or tenderness.  Neurological: She is alert and oriented to  person, place, and time. She has normal strength. No cranial nerve deficit or sensory deficit. GCS eye subscore is 4. GCS verbal subscore is 5. GCS motor subscore is 6.  Skin: Skin is warm and dry. No abrasion and no rash noted.  Psychiatric: She has a normal mood and affect. Her speech is normal and behavior is normal.  Nursing note and vitals reviewed.   ED Course  Procedures (including critical care time) Labs Review Labs Reviewed  CBC WITH  DIFFERENTIAL/PLATELET  COMPREHENSIVE METABOLIC PANEL  LIPASE, BLOOD    Imaging Review No results found. I have personally reviewed and evaluated these images and lab results as part of my medical decision-making.   EKG Interpretation None      MDM   Final diagnoses:  None   Patient given IV fluids and pain meds here. NG tube placed per nursing. Will be admitted to the medicine service     Lacretia Leigh, MD 02/08/16 0002

## 2016-02-07 NOTE — ED Notes (Addendum)
Patient arrived via [facility]. EMS reports: patient recently hospitalized for SBO at [facility]. Discharged 02/05/16. Patient began having abdominal pain this evening. Vomited x 2. EMS called. 18 gauge in Left AC. Zofran 4 mg administered by EMS. Abdomen tender to palpation. Pain 10/10. VSS. BP 131/79. Pulse 86, Resp 18, Pulse ox 100% on room air. CBG 128.

## 2016-02-07 NOTE — ED Notes (Signed)
Patient transported to X-ray 

## 2016-02-08 ENCOUNTER — Encounter (HOSPITAL_COMMUNITY): Payer: Self-pay | Admitting: *Deleted

## 2016-02-08 DIAGNOSIS — K567 Ileus, unspecified: Secondary | ICD-10-CM

## 2016-02-08 DIAGNOSIS — D259 Leiomyoma of uterus, unspecified: Secondary | ICD-10-CM | POA: Diagnosis present

## 2016-02-08 DIAGNOSIS — D509 Iron deficiency anemia, unspecified: Secondary | ICD-10-CM | POA: Diagnosis present

## 2016-02-08 DIAGNOSIS — K5669 Other intestinal obstruction: Secondary | ICD-10-CM | POA: Diagnosis not present

## 2016-02-08 DIAGNOSIS — G8929 Other chronic pain: Secondary | ICD-10-CM | POA: Diagnosis present

## 2016-02-08 DIAGNOSIS — K5660 Unspecified intestinal obstruction: Secondary | ICD-10-CM | POA: Diagnosis present

## 2016-02-08 DIAGNOSIS — F1190 Opioid use, unspecified, uncomplicated: Secondary | ICD-10-CM

## 2016-02-08 DIAGNOSIS — F17210 Nicotine dependence, cigarettes, uncomplicated: Secondary | ICD-10-CM | POA: Diagnosis present

## 2016-02-08 DIAGNOSIS — K5900 Constipation, unspecified: Secondary | ICD-10-CM | POA: Diagnosis not present

## 2016-02-08 DIAGNOSIS — F1110 Opioid abuse, uncomplicated: Secondary | ICD-10-CM | POA: Diagnosis present

## 2016-02-08 DIAGNOSIS — N920 Excessive and frequent menstruation with regular cycle: Secondary | ICD-10-CM | POA: Diagnosis present

## 2016-02-08 DIAGNOSIS — Z720 Tobacco use: Secondary | ICD-10-CM | POA: Diagnosis not present

## 2016-02-08 LAB — BASIC METABOLIC PANEL
Anion gap: 13 (ref 5–15)
BUN: <5 mg/dL — ABNORMAL LOW (ref 6–20)
CO2: 22 mmol/L (ref 22–32)
Calcium: 9 mg/dL (ref 8.9–10.3)
Chloride: 101 mmol/L (ref 101–111)
Creatinine, Ser: 0.66 mg/dL (ref 0.44–1.00)
GFR calc Af Amer: >60 mL/min (ref >60)
GFR calc non Af Amer: >60 mL/min (ref >60)
Glucose, Bld: 131 mg/dL — ABNORMAL HIGH (ref 65–99)
Potassium: 3.9 mmol/L (ref 3.5–5.1)
Sodium: 136 mmol/L (ref 135–145)

## 2016-02-08 LAB — CBC
HCT: 31.5 % — ABNORMAL LOW (ref 36.0–46.0)
Hemoglobin: 10.1 g/dL — ABNORMAL LOW (ref 12.0–15.0)
MCH: 27.9 pg (ref 26.0–34.0)
MCHC: 32.1 g/dL (ref 30.0–36.0)
MCV: 87 fL (ref 78.0–100.0)
Platelets: 265 10*3/uL (ref 150–400)
RBC: 3.62 MIL/uL — ABNORMAL LOW (ref 3.87–5.11)
RDW: 16 % — ABNORMAL HIGH (ref 11.5–15.5)
WBC: 10.8 10*3/uL — ABNORMAL HIGH (ref 4.0–10.5)

## 2016-02-08 MED ORDER — HYDROMORPHONE HCL 1 MG/ML IJ SOLN
1.0000 mg | INTRAMUSCULAR | Status: DC | PRN
  Administered 2016-02-08: 1 mg via INTRAVENOUS
  Filled 2016-02-08: qty 1

## 2016-02-08 MED ORDER — DIATRIZOATE MEGLUMINE & SODIUM 66-10 % PO SOLN
90.0000 mL | Freq: Once | ORAL | Status: AC
  Administered 2016-02-08: 90 mL via NASOGASTRIC

## 2016-02-08 MED ORDER — PROMETHAZINE HCL 25 MG/ML IJ SOLN
12.5000 mg | Freq: Four times a day (QID) | INTRAMUSCULAR | Status: DC | PRN
  Administered 2016-02-08 – 2016-02-10 (×4): 12.5 mg via INTRAVENOUS
  Filled 2016-02-08 (×4): qty 1

## 2016-02-08 MED ORDER — LIDOCAINE VISCOUS 2 % MT SOLN
15.0000 mL | Freq: Four times a day (QID) | OROMUCOSAL | Status: DC | PRN
  Administered 2016-02-08: 15 mL via OROMUCOSAL
  Filled 2016-02-08: qty 15

## 2016-02-08 MED ORDER — POTASSIUM CHLORIDE IN NACL 20-0.45 MEQ/L-% IV SOLN
INTRAVENOUS | Status: DC
  Administered 2016-02-08 – 2016-02-10 (×6): via INTRAVENOUS
  Filled 2016-02-08 (×12): qty 1000

## 2016-02-08 MED ORDER — DIATRIZOATE MEGLUMINE & SODIUM 66-10 % PO SOLN
ORAL | Status: AC
  Filled 2016-02-08: qty 90

## 2016-02-08 MED ORDER — ENOXAPARIN SODIUM 40 MG/0.4ML ~~LOC~~ SOLN
40.0000 mg | SUBCUTANEOUS | Status: DC
  Filled 2016-02-08 (×3): qty 0.4

## 2016-02-08 MED ORDER — PROMETHAZINE HCL 25 MG PO TABS: 12.5000 mg | ORAL_TABLET | Freq: Four times a day (QID) | ORAL | Status: DC | PRN

## 2016-02-08 MED ORDER — CETYLPYRIDINIUM CHLORIDE 0.05 % MT LIQD
7.0000 mL | Freq: Two times a day (BID) | OROMUCOSAL | Status: DC
  Administered 2016-02-08 – 2016-02-10 (×5): 7 mL via OROMUCOSAL

## 2016-02-08 MED ORDER — ACETAMINOPHEN 650 MG RE SUPP: 650.0000 mg | Freq: Four times a day (QID) | RECTAL | Status: DC | PRN

## 2016-02-08 MED ORDER — KETOROLAC TROMETHAMINE 15 MG/ML IJ SOLN
15.0000 mg | Freq: Four times a day (QID) | INTRAMUSCULAR | Status: DC | PRN
  Administered 2016-02-08 – 2016-02-11 (×8): 15 mg via INTRAVENOUS
  Filled 2016-02-08 (×8): qty 1

## 2016-02-08 MED ORDER — LORAZEPAM 2 MG/ML IJ SOLN
0.5000 mg | Freq: Once | INTRAMUSCULAR | Status: AC
  Administered 2016-02-08: 0.5 mg via INTRAVENOUS
  Filled 2016-02-08: qty 1

## 2016-02-08 MED ORDER — NICOTINE 14 MG/24HR TD PT24
14.0000 mg | MEDICATED_PATCH | Freq: Every day | TRANSDERMAL | Status: DC
  Administered 2016-02-08 – 2016-02-11 (×4): 14 mg via TRANSDERMAL
  Filled 2016-02-08 (×4): qty 1

## 2016-02-08 MED ORDER — ACETAMINOPHEN 325 MG PO TABS
650.0000 mg | ORAL_TABLET | Freq: Four times a day (QID) | ORAL | Status: DC | PRN
Start: 2016-02-08 — End: 2016-02-11

## 2016-02-08 MED ORDER — IPRATROPIUM-ALBUTEROL 0.5-2.5 (3) MG/3ML IN SOLN: 3.0000 mL | Freq: Four times a day (QID) | RESPIRATORY_TRACT | Status: DC | PRN

## 2016-02-08 NOTE — Progress Notes (Signed)
Subjective: Pt Followed by Dr. 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  Seen in the ER 12/29/15, for abdominal and shoulder pain/  Pain attributed to her fibroids.  Seen again at [facility] 01/06/16,with abdominal pain, she tried an enema without relief and then percocet.   She was apparently disimpacted and treated with enema.  MRI on 01/06/16 that showed no issue with her fibroids  Again diagnosed with chronic pelvic pain and constipation.  She returned 01/07/16 with abdominal pain, no BM since 01/01/16.  CT at this time shows:Diffusely stool-filled colon suggesting constipation. No evidence of bowel obstruction. Possible thickening of the wall of the right colon and hepatic flexure. This may be due to adherent stool but colitis is not excluded.  She was treated with Miralax, mag citrate, enemas and diet changes. Levaquin for UTI;   Follow up per PCP    Back to ED 01/09/16 and admitted for possible SBO and admitted to the hospitalist service. Seen on 1/30 by Dr. Collene Mares GI service.  "Chronic constipation with ileus secondary to narcotic abuse. We will try him a gallon of Miralax today and see how she responds to this. She should strongly be encouraged to stop the use of narcotic medications as these were both noncontributory chronicity of this problem. Repeat films in AM."  She had good results, but films showed distended SB and large bowel loops, she was treated with Levaquin and Flagyl for 5 days and discharged home on these medicines.  She was readmitted to [facility] 01/29/16, with chronic abdominal pain, PSBO and colitis, she has a hx of C sections and tubal ligation. CT scan 01/28/17 shows: Dilated fluid-filled small bowel with decompressed distal small bowel and transition zone probably in the left abdomen. Changes are consistent with small bowel obstruction of nonspecific etiology. Colon is decompressed but there is evidence of fluid and infiltration  around the colon suggesting colitis. Small amount of free fluid in the pelvis is likely reactive. Bladder wall thickening suggests cystitis.  She was placed on Flagyl and Cipro   NG was placed. She was seen by Dr. Kieth Brightly and followed by our service.  She was found to have contrast in colon 2/18, NG was clamped 2/19.  Film 2/20 shows mechanical vs function PSBO. She was close to going to the OR last week but then had diarrhea and was sent home on 02/05/16. She returns again with pain on 02/08/16, no BM since discharge and emesis yesterday. W/U at [facility]  shows a low grade fever 100.  WBC 10.8,BMP Ok, 3 way acute xray shows:  Scattered fluid-filled loops of small and large bowel noted within the abdomen, raising question for mild dysmotility. No free intra-abdominal air seen. .  [facility] and has a Soil scientist.  She has been on Percocet since 2009.  On average she take 4 -10 mg oxycodone per day. We are ask to see again.   Prior to Admission medications   Medication Sig Start Date End Date Taking? Authorizing Provider  oxyCODONE-acetaminophen (PERCOCET) 10-325 MG tablet Take 1 tablet by mouth every 4 (four) hours as needed for pain.   Yes Historical Provider, MD  traMADol (ULTRAM-ER) 100 MG 24 hr tablet Take 1 tablet (100 mg total) by mouth every 6 (six) hours as needed for pain. 02/05/16  Yes Eugenie Filler, MD  polyethylene glycol powder (GLYCOLAX/MIRALAX) powder 1 capful daily as needed for constipation Patient not taking:  Reported on 01/28/2016 01/06/16   Luvenia Redden, PA-C  saccharomyces boulardii (FLORASTOR) 250 MG capsule Take 1 capsule (250 mg total) by mouth 2 (two) times daily. Patient not taking: Reported on 01/28/2016 01/12/16   Oswald Hillock, MD        Objective: Vital signs in last 24 hours: Temp:  [98.1 F (36.7 C)-100.1 F (37.8 C)] 100.1 F (37.8 C) (02/27 0549) Pulse Rate:  [74-97] 84 (02/27 0549) Resp:  [16-18] 16 (02/27 0549) BP:  (110-136)/(61-85) 136/71 mmHg (02/27 0549) SpO2:  [98 %-100 %] 100 % (02/27 0549) Weight:  [68.085 kg (150 lb 1.6 oz)] 68.085 kg (150 lb 1.6 oz) (02/27 0209) Last BM Date: 02/05/16  Intake/Output from previous day: 02/26 0701 - 02/27 0700 In: 431.3 [I.V.:431.3] Out: -  Intake/Output this shift: Total I/O In: 0  Out: 1050 [Urine:1050]  General appearance: alert, cooperative and no distress Resp: clear to auscultation bilaterally Cardio: regular rate and rhythm, S1, S2 normal, no murmur, click, rub or gallop GI: soft, + BS, minimal distension, not tender to palpation. Extremities: extremities normal, atraumatic, no cyanosis or edema  Lab Results:   Recent Labs  02/07/16 2230 02/08/16 0546  WBC 9.2 10.8*  HGB 10.8* 10.1*  HCT 33.5* 31.5*  PLT 233 265    BMET  Recent Labs  02/07/16 2230 02/08/16 0546  NA 136 136  K 3.6 3.9  CL 101 101  CO2 24 22  GLUCOSE 113* 131*  BUN <5* <5*  CREATININE 0.75 0.66  CALCIUM 9.4 9.0   PT/INR No results for input(s): LABPROT, INR in the last 72 hours.   Recent Labs Lab 02/07/16 2230  AST 16  ALT 9*  ALKPHOS 46  BILITOT 0.2*  PROT 7.6  ALBUMIN 3.9     Lipase     Component Value Date/Time   LIPASE 16 02/07/2016 2230     Studies/Results: Dg Abd Acute W/chest  02/07/2016  CLINICAL DATA:  Acute onset of lower abdominal pain, nausea and vomiting. Initial encounter. EXAM: DG ABDOMEN ACUTE W/ 1V CHEST COMPARISON:  Abdominal radiograph performed 02/05/2016, and chest radiograph performed 01/09/2016 FINDINGS: The lungs are well-aerated and clear. There is no evidence of focal opacification, pleural effusion or pneumothorax. The cardiomediastinal silhouette is within normal limits. Bilateral nipple shadows are noted. The visualized bowel gas pattern is unremarkable. Scattered fluid and air are seen within the small and large bowel; there is no evidence of small bowel dilatation to suggest obstruction. No free intra-abdominal air  is identified on the provided upright view. No acute osseous abnormalities are seen; the sacroiliac joints are unremarkable in appearance. Foreign densities overlying the mid pelvis are thought to be associated with the patient's pants. IMPRESSION: 1. Scattered fluid-filled loops of small and large bowel noted within the abdomen, raising question for mild dysmotility. No free intra-abdominal air seen. 2. No acute cardiopulmonary process identified. Electronically Signed   By: Garald Balding M.D.   On: 02/07/2016 23:33    Medications: . antiseptic oral rinse  7 mL Mouth Rinse BID  . enoxaparin (LOVENOX) injection  40 mg Subcutaneous Q24H  . nicotine  14 mg Transdermal Daily    Assessment/Plan Partial SBO vs functional obstruction Possible colitis Chronic pain and narcotic use Fibroids Hx of C section/tubal ligation. Tobacco use  Plan:  NG placement and small bowel protocol. Follow with you. Lidocaine viscous for NG placement ordered.       LOS: 0 days    Mitzi Lilja 02/08/2016

## 2016-02-08 NOTE — Progress Notes (Signed)
Attempted to insert ngt this pm but both pt and her mom said not to go ahead until lidocaine administered as ordered. Med requested from pharmacy

## 2016-02-08 NOTE — Progress Notes (Signed)
Instructions to keep pt connected to low suction for 2 hrs given. Pt should also be at an angle of 30 degrees at least. To call IR on [phone] at the end of the 2hr period so as they can bring the gastrografin solution

## 2016-02-08 NOTE — Progress Notes (Signed)
61fr NGT inserted as ordered and connected to low intermittent suction

## 2016-02-08 NOTE — Progress Notes (Signed)
Pt asking for a dose of ativan before she can have ngt placed

## 2016-02-08 NOTE — Progress Notes (Addendum)
Patient seen and examined  40 year old female past medical history significant for C-section 4, iron deficiency anemia, uterine fibroids; who presented with complaints of abdominal pain  She had nausea and vomiting, no evidence of hematemesis. This is the patient's 6 th  evaluation for abdominal pain since 12/29/2015. Patient was recently discharged 2/16 to 2/24, for small bowel obstruction that resolved with supportive care. General surgery was consulted, they felt that narcotics were playing a role. Patient's diet was subsequently advanced. NG tube was discontinued and the patient was discharged home. Patient also found to have electrolyte abnormalities which were corrected.  Plan Recurrent small bowel obstruction Continue MedSurg Npo, patient now agreeable to ngt , requested Ativan prior to placement, Scattered fluid-filled loops of small and large bowel noted within the abdomen, raising question for mild dysmotility Discontinued IV narcotics given suspicion for exacerbation of his symptoms due to narcotic overuse Follow-up KUB Last seen by Dr. Excell Seltzer. , General surgery on 2/24 Reconsulted general surgery for their input

## 2016-02-08 NOTE — Progress Notes (Signed)
Gastrograffin administered via ngt as ordered. g tube currently clamped off

## 2016-02-08 NOTE — H&P (Signed)
PCP:   [facility]   Chief Complaint:  Abdominal pain  HPI: This is a 40 year old female who presents with complaints of abdominal pain. She had nausea and vomiting, no evidence of hematemesis. There is reports of generalized abdominal pain. There is reports a form preferred of loss of consciousness prior to 911's arrival. Here in the ER the patient is unable to give me any history as she is received Ativan and Dilaudid. Patient's mother is at bedside and provided history. Patient is sedated but arousable. No NG tube in place this patient refused NG tube.  This is the patient's third admission in a month. Patient's first admission was January 31 for abdominal pain and possible ileus, thought related to chronic narcotic use. She is treated and discharged. Per mother patient is other chronic narcotics but she does state that her daughter does not live with her therefore she is not fully aware of all of her medications. Per mom she was approximated for brief period of time. Patient second admission was for small bowel obstruction. Patient was seen and treated conservatively and discharged home. Today patient again developed nausea and vomiting and abdominal pain.  Review of Systems:  The patient denies anorexia, fever, weight loss,, vision loss, decreased hearing, hoarseness, chest pain, syncope, dyspnea on exertion, peripheral edema, balance deficits, hemoptysis, abdominal pain, melena, hematochezia, severe indigestion/heartburn, hematuria, incontinence, genital sores, muscle weakness, suspicious skin lesions, transient blindness, difficulty walking, depression, unusual weight change, abnormal bleeding, enlarged lymph nodes, angioedema, and breast masses.  Past Medical History: Past Medical History  Diagnosis Date  . Heart murmur   . Fibroids   . Anemia    Past Surgical History  Procedure Laterality Date  . Cesarean section    . Tubal ligation    . Ankle surgery       Medications: Prior to Admission medications   Medication Sig Start Date End Date Taking? Authorizing Provider  oxyCODONE-acetaminophen (PERCOCET) 10-325 MG tablet Take 1 tablet by mouth every 4 (four) hours as needed for pain.   Yes Historical Provider, MD  traMADol (ULTRAM-ER) 100 MG 24 hr tablet Take 1 tablet (100 mg total) by mouth every 6 (six) hours as needed for pain. 02/05/16  Yes Eugenie Filler, MD  polyethylene glycol powder [facility]) powder 1 capful daily as needed for constipation Patient not taking: Reported on 01/28/2016 01/06/16   Luvenia Redden, PA-C  saccharomyces boulardii (FLORASTOR) 250 MG capsule Take 1 capsule (250 mg total) by mouth 2 (two) times daily. Patient not taking: Reported on 01/28/2016 01/12/16   Oswald Hillock, MD    Allergies:  No Known Allergies  Social History:  reports that she has been smoking Cigarettes.  She has been smoking about 0.50 packs per day. She has never used smokeless tobacco. She reports that she does not drink alcohol or use illicit drugs.  Family History: Family History  Problem Relation Age of Onset  . Diabetes Maternal Grandmother   . Hypertension Maternal Aunt     Physical Exam: Filed Vitals:   02/07/16 2153 02/07/16 2200 02/07/16 2215 02/07/16 2230  BP: 116/74 123/81 129/78 135/85  Pulse: 82 74 75 95  Temp: 98.1 F (36.7 C)     TempSrc: Oral     Resp: 18     SpO2: 100% 100% 99% 100%    General:  Sedated female, arousable Eyes: PERRLA, pink conjunctiva, no scleral icterus ENT: Moist oral mucosa, neck supple, no thyromegaly Lungs: clear to ascultation, no wheeze, no  crackles, no use of accessory muscles Cardiovascular: regular rate and rhythm, no regurgitation, no gallops, no murmurs. No carotid bruits, no JVD Abdomen: soft,  generalized tenderness to palpation, no bowel sounds, non-distended, no organomegaly, not an acute abdomen GU: not examined Neuro: CN II - XII grossly intact, sensation  intact Musculoskeletal: strength 5/5 all extremities, no clubbing, cyanosis or edema Skin: no rash, no subcutaneous crepitation, no decubitus Psych: appropriate patient   Labs on Admission:   Recent Labs  02/05/16 0438 02/07/16 2230  NA 138 136  K 5.0 3.6  CL 107 101  CO2 20* 24  GLUCOSE 79 113*  BUN <5* <5*  CREATININE 0.62 0.75  CALCIUM 9.1 9.4  MG 1.8  --     Recent Labs  02/07/16 2230  AST 16  ALT 9*  ALKPHOS 46  BILITOT 0.2*  PROT 7.6  ALBUMIN 3.9    Recent Labs  02/07/16 2230  LIPASE 16    Recent Labs  02/05/16 0438 02/07/16 2230  WBC 5.8 9.2  NEUTROABS  --  7.1  HGB 9.8* 10.8*  HCT 30.9* 33.5*  MCV 91.2 86.6  PLT 262 233   No results for input(s): CKTOTAL, CKMB, CKMBINDEX, TROPONINI in the last 72 hours. Invalid input(s): POCBNP No results for input(s): DDIMER in the last 72 hours. No results for input(s): HGBA1C in the last 72 hours. No results for input(s): CHOL, HDL, LDLCALC, TRIG, CHOLHDL, LDLDIRECT in the last 72 hours. No results for input(s): TSH, T4TOTAL, T3FREE, THYROIDAB in the last 72 hours.  Invalid input(s): FREET3 No results for input(s): VITAMINB12, FOLATE, FERRITIN, TIBC, IRON, RETICCTPCT in the last 72 hours.  Micro Results: Recent Results (from the past 240 hour(s))  MRSA PCR Screening     Status: None   Collection Time: 02/04/16  6:35 AM  Result Value Ref Range Status   MRSA by PCR NEGATIVE NEGATIVE Final    Comment:        The GeneXpert MRSA Assay (FDA approved for NASAL specimens only), is one component of a comprehensive MRSA colonization surveillance program. It is not intended to diagnose MRSA infection nor to guide or monitor treatment for MRSA infections.      Radiological Exams on Admission: Dg Abd Acute W/chest  02/07/2016  CLINICAL DATA:  Acute onset of lower abdominal pain, nausea and vomiting. Initial encounter. EXAM: DG ABDOMEN ACUTE W/ 1V CHEST COMPARISON:  Abdominal radiograph performed  02/05/2016, and chest radiograph performed 01/09/2016 FINDINGS: The lungs are well-aerated and clear. There is no evidence of focal opacification, pleural effusion or pneumothorax. The cardiomediastinal silhouette is within normal limits. Bilateral nipple shadows are noted. The visualized bowel gas pattern is unremarkable. Scattered fluid and air are seen within the small and large bowel; there is no evidence of small bowel dilatation to suggest obstruction. No free intra-abdominal air is identified on the provided upright view. No acute osseous abnormalities are seen; the sacroiliac joints are unremarkable in appearance. Foreign densities overlying the mid pelvis are thought to be associated with the patient's pants. IMPRESSION: 1. Scattered fluid-filled loops of small and large bowel noted within the abdomen, raising question for mild dysmotility. No free intra-abdominal air seen. 2. No acute cardiopulmonary process identified. Electronically Signed   By: Garald Balding M.D.   On: 02/07/2016 23:33    Assessment/Plan Present on Admission:  . Ileus ([place]) . recent h/o SBO (small bowel obstruction) ([place]) -admit to MedSurg, nothing by mouth, IV fluid hydration -Patient refuses NG tube. One not placed  but if her nausea and vomiting returns or if ileus persists NG tube will be placed.  -Repeat KUB ordered for a.m. -Consult surgeon in a.m.  -Antiemetics and pain medications as needed  -Patient's only abdominal surgeries include C-section 4 and a tubal ligation.  . Tobacco abuse -Nicotine patch, nebulizers as needed   Genavie Boettger 02/08/2016, 12:45 AM

## 2016-02-09 ENCOUNTER — Inpatient Hospital Stay (HOSPITAL_COMMUNITY): Payer: Medicaid Other

## 2016-02-09 DIAGNOSIS — F1190 Opioid use, unspecified, uncomplicated: Secondary | ICD-10-CM

## 2016-02-09 DIAGNOSIS — K5900 Constipation, unspecified: Secondary | ICD-10-CM

## 2016-02-09 LAB — URINALYSIS, ROUTINE W REFLEX MICROSCOPIC
Glucose, UA: NEGATIVE mg/dL
Ketones, ur: >80 mg/dL — AB
Leukocytes, UA: NEGATIVE
Nitrite: NEGATIVE
Protein, ur: NEGATIVE mg/dL
Specific Gravity, Urine: 1.022 (ref 1.005–1.030)
pH: 6 (ref 5.0–8.0)

## 2016-02-09 LAB — COMPREHENSIVE METABOLIC PANEL
ALT: 9 U/L — ABNORMAL LOW (ref 14–54)
AST: 14 U/L — ABNORMAL LOW (ref 15–41)
Albumin: 2.8 g/dL — ABNORMAL LOW (ref 3.5–5.0)
Alkaline Phosphatase: 41 U/L (ref 38–126)
Anion gap: 5 (ref 5–15)
BUN: <5 mg/dL — ABNORMAL LOW (ref 6–20)
CO2: 25 mmol/L (ref 22–32)
Calcium: 8.4 mg/dL — ABNORMAL LOW (ref 8.9–10.3)
Chloride: 108 mmol/L (ref 101–111)
Creatinine, Ser: 0.69 mg/dL (ref 0.44–1.00)
GFR calc Af Amer: >60 mL/min (ref >60)
GFR calc non Af Amer: >60 mL/min (ref >60)
Glucose, Bld: 91 mg/dL (ref 65–99)
Potassium: 4 mmol/L (ref 3.5–5.1)
Sodium: 138 mmol/L (ref 135–145)
Total Bilirubin: 0.5 mg/dL (ref 0.3–1.2)
Total Protein: 5.8 g/dL — ABNORMAL LOW (ref 6.5–8.1)

## 2016-02-09 LAB — URINE MICROSCOPIC-ADD ON

## 2016-02-09 LAB — CBC
HCT: 28.6 % — ABNORMAL LOW (ref 36.0–46.0)
Hemoglobin: 9.3 g/dL — ABNORMAL LOW (ref 12.0–15.0)
MCH: 28.6 pg (ref 26.0–34.0)
MCHC: 32.5 g/dL (ref 30.0–36.0)
MCV: 88 fL (ref 78.0–100.0)
Platelets: 231 10*3/uL (ref 150–400)
RBC: 3.25 MIL/uL — ABNORMAL LOW (ref 3.87–5.11)
RDW: 16.3 % — ABNORMAL HIGH (ref 11.5–15.5)
WBC: 4.4 10*3/uL (ref 4.0–10.5)

## 2016-02-09 LAB — C DIFFICILE QUICK SCREEN W PCR REFLEX
C Diff antigen: NEGATIVE
C Diff interpretation: NEGATIVE
C Diff toxin: NEGATIVE

## 2016-02-09 MED ORDER — HYDROMORPHONE HCL 1 MG/ML IJ SOLN
1.0000 mg | Freq: Four times a day (QID) | INTRAMUSCULAR | Status: DC | PRN
  Administered 2016-02-09 – 2016-02-10 (×4): 1 mg via INTRAVENOUS
  Filled 2016-02-09 (×4): qty 1

## 2016-02-09 MED ORDER — PANTOPRAZOLE SODIUM 40 MG IV SOLR
40.0000 mg | Freq: Two times a day (BID) | INTRAVENOUS | Status: DC
  Administered 2016-02-09 – 2016-02-11 (×4): 40 mg via INTRAVENOUS
  Filled 2016-02-09 (×4): qty 40

## 2016-02-09 MED ORDER — METOCLOPRAMIDE HCL 5 MG/ML IJ SOLN
10.0000 mg | Freq: Three times a day (TID) | INTRAMUSCULAR | Status: DC
  Administered 2016-02-09 – 2016-02-11 (×6): 10 mg via INTRAVENOUS
  Filled 2016-02-09 (×6): qty 2

## 2016-02-09 NOTE — Progress Notes (Addendum)
[facility] PROGRESS NOTE  SHENY CALENDINE [id number] DOB: [birth year] DOA: 02/07/2016 PCP: [facility]  Length of stay: 1   Assessment/Plan: Principal Problem:   Ileus ([place]) Active Problems:   Tobacco abuse   Constipation   SBO (small bowel obstruction) ([place])   Chronic narcotic use    Brief summary 40 year old female past medical history significant for C-section 4, iron deficiency anemia, uterine fibroids; who presented with complaints of abdominal pain She had nausea and vomiting, no evidence of hematemesis. This is the patient's 6 th evaluation for abdominal pain since 12/29/2015. Patient was recently discharged 2/16 to 2/24, for small bowel obstruction that resolved with supportive care. General surgery was consulted, they felt that narcotics were playing a role. Patient's diet was subsequently advanced. NG tube was discontinued and the patient was discharged home. Patient also found to have electrolyte abnormalities which were corrected.She was close to going to the OR last week but then had diarrhea and was sent home on 02/05/16.  Assessment and plan Recurrent small bowel obstruction Continue MedSurg continue NG tube, output 750 since this morning Npo, trial of Reglan IV, suspect dysmotility, worsened by narcotic dependence, narcotics have been discontinued Small bowel follow-through showed contrast had progressed to the colon General surgery recommends further input from GI Patient sees Dr. Collene Mares Continue NG tube to suction Start Reglan, advance diet to clear liquids tomorrow after clamping NG tube Rule out C. difficile as the patient had diarrhea a few days ago   Chronic iron deficiency anemia secondary to heavy menstrual periods, hemoglobin stable Baseline 9-10  Fever Low-grade fever of 100.1, chest x-ray, UA X    DVT prophylaxsis Lovenox  Code Status:      Code Status Orders        Start     Ordered   02/08/16 0048  Full code    Continuous     02/08/16 0048     Family Communication: Discussed in detail with the patient, all imaging results, lab results explained to the patient   Disposition Plan:  Anticipate discharge in 2-3 days    Consultants:  General surgery    Procedures:  None  Antibiotics: Anti-infectives    None         HPI/Subjective: Denies any nausea vomiting this morning, low-grade fever off 100.1  Objective: Filed Vitals:   02/08/16 0549 02/08/16 1525 02/08/16 2226 02/09/16 0602  BP: 136/71 109/63 122/62 118/60  Pulse: 84 87 70 74  Temp: 100.1 F (37.8 C) 98.8 F (37.1 C) 99.2 F (37.3 C) 99.1 F (37.3 C)  TempSrc: Oral Oral Oral Oral  Resp: 16 16 16 16   Height:      Weight:      SpO2: 100% 100% 100% 99%    Intake/Output Summary (Last 24 hours) at 02/09/16 1308 Last data filed at 02/09/16 0800  Gross per 24 hour  Intake   3215 ml  Output   1250 ml  Net   1965 ml    Exam:  General: No acute respiratory distress Lungs: Clear to auscultation bilaterally without wheezes or crackles Cardiovascular: Regular rate and rhythm without murmur gallop or rub normal S1 and S2 Abdomen: Nontender, nondistended, soft, bowel sounds positive, no rebound, no ascites, no appreciable mass Extremities: No significant cyanosis, clubbing, or edema bilateral lower extremities     Data Review   Micro Results Recent Results (from the past 240 hour(s))  MRSA PCR Screening     Status: None  Collection Time: 02/04/16  6:35 AM  Result Value Ref Range Status   MRSA by PCR NEGATIVE NEGATIVE Final    Comment:        The GeneXpert MRSA Assay (FDA approved for NASAL specimens only), is one component of a comprehensive MRSA colonization surveillance program. It is not intended to diagnose MRSA infection nor to guide or monitor treatment for MRSA infections.     Radiology Reports Ct Abdomen Pelvis W Contrast  01/29/2016  CLINICAL DATA:  Lower abdominal pain, nausea and  vomiting, and diarrhea for 2 weeks. EXAM: CT ABDOMEN AND PELVIS WITH CONTRAST TECHNIQUE: Multidetector CT imaging of the abdomen and pelvis was performed using the standard protocol following bolus administration of intravenous contrast. CONTRAST:  19mL OMNIPAQUE IOHEXOL 300 MG/ML  SOLN COMPARISON:  01/08/2016 FINDINGS: Mild dependent changes in the lung bases. The liver, spleen, gallbladder, pancreas, adrenal glands, abdominal aorta, inferior vena cava, and retroperitoneal lymph nodes are unremarkable. Cyst in the right kidney. Renal nephrograms are symmetrical. No hydronephrosis. Stomach is decompressed. Dilated fluid-filled small bowel with decompressed terminal ileum. Transition zone is difficult to localize but appears to be in the left lower abdomen. Cause is indeterminate. The colon is decompressed but there is evidence of edema and stranding in the fat around the colon which may indicate changes of colitis. No free air in the abdomen. No pneumatosis or portal venous gas. Pelvis: The uterus is enlarged suggesting recent postpartum state. Anterior defect in the lower uterine wall suggesting a C-section scar. Small amount of free fluid in the pelvis is likely reactive. Bladder wall is mildly thickened suggesting possible cystitis. Mild degenerative changes in the spine. No destructive bone lesions. IMPRESSION: Dilated fluid-filled small bowel with decompressed distal small bowel and transition zone probably in the left abdomen. Changes are consistent with small bowel obstruction of nonspecific etiology. Colon is decompressed but there is evidence of fluid and infiltration around the colon suggesting colitis. Small amount of free fluid in the pelvis is likely reactive. Bladder wall thickening suggests cystitis. Presumed recent postpartum changes in the uterus consistent with C-section. Electronically Signed   By: Lucienne Capers M.D.   On: 01/29/2016 04:08   Dg Abd 2 Views  02/05/2016  CLINICAL DATA:   Follow-up small bowel obstruction EXAM: ABDOMEN - 2 VIEW COMPARISON:  Supine and upright abdominal films of February 04, 2016 FINDINGS: There remain loops of fluid and gas-filled small bowel centered in the mid and left aspect of the abdomen. Contrast and gas are present within the ascending and proximal transverse colons as well as the rectosigmoid. No free extraluminal gas collections are observed. The bony structures exhibit no acute abnormality. There is stable curvature of the thoracic and upper lumbar spine with convexity toward the right. IMPRESSION: Persistent relatively high-grade mid to distal small bowel obstruction. Previously DIS administered oral contrast has transited more of the colon. There is no evidence of obstruction. Electronically Signed   By: David  Martinique M.D.   On: 02/05/2016 07:22   Dg Abd 2 Views  02/04/2016  CLINICAL DATA:  Small bowel obstruction. Possible surgery this morning. EXAM: ABDOMEN - 2 VIEW COMPARISON:  02/03/2016 FINDINGS: Unchanged bowel gas pattern with small more than large bowel distention. Small bowel distention is improved compared to yesterday. Oral contrast still seen in the proximal colon, with interval emptying of the distal colon and rectum. No concerning intra-abdominal mass effect or calcification. No pneumoperitoneum. Lung bases are clear. IMPRESSION: Partial small bowel obstruction pattern. Small bowel distention is  improved from yesterday. Electronically Signed   By: Monte Fantasia M.D.   On: 02/04/2016 08:05   Dg Abd 2 Views  02/03/2016  CLINICAL DATA:  Follow-up small bowel obstruction EXAM: ABDOMEN - 2 VIEW COMPARISON:  02/02/2016 FINDINGS: There is slight worsening of small bowel distension with air-fluid levels in upper abdomen consistent with partial small bowel obstruction. Again noted contrast material throughout the colon. NG tube has been removed. There is no evidence of free abdominal air. IMPRESSION: There is slight worsening small bowel  distension with air-fluid levels in mid and upper abdomen consistent with at least partial small bowel obstruction. Again noted contrast material throughout the colon. NG tube has been removed. Electronically Signed   By: Lahoma Crocker M.D.   On: 02/03/2016 08:45   Dg Abd 2 Views  02/02/2016  CLINICAL DATA:  Small-bowel obstruction and abdominal pain EXAM: ABDOMEN - 2 VIEW COMPARISON:  02/01/2016 FINDINGS: Scattered large and small bowel gas is noted. Contrast material is noted throughout the colon from a recent CT. Nasogastric catheter remains within the stomach. Persistent mild small bowel dilatation is seen. The overall appearance has improved slightly in the interval from the prior exam. No free air is seen. No other focal abnormality is noted. IMPRESSION: Slight improvement in the degree of small bowel dilatation when compared with the prior study. Electronically Signed   By: Inez Catalina M.D.   On: 02/02/2016 08:28   Dg Abd 2 Views  02/01/2016  CLINICAL DATA:  Small bowel obstruction EXAM: ABDOMEN - 2 VIEW COMPARISON:  01/31/2016 FINDINGS: NG tube remains in stable position. Oral contrast material noted within the colon. Dilated small bowel loops in the mid abdomen compatible with partial small bowel obstruction. No free air organomegaly. IMPRESSION: Continued partial small bowel obstruction pattern.  No real change. Electronically Signed   By: Rolm Baptise M.D.   On: 02/01/2016 09:03   Dg Abd 2 Views  01/31/2016  CLINICAL DATA:  PT C/O CONTINUED ABD PAIN, WEAKNESS, STATES SHE HAS HAD CONSTIPATION X 1 WK, PRIOR SBO, PT HAD BM IN THE NIGHT. EXAM: ABDOMEN - 2 VIEW COMPARISON:  01/30/2016 FINDINGS: Nasogastric tube is in place, tip overlying the level of the distal stomach. Gush persistent mild dilatation of small bowel loops the dilatation has improved. There has been some progression of contrast from small bowel loops to the colon. Air-fluid levels versus within mildly dilated loops of small bowel, also  slightly improved. IMPRESSION: 1. Decreased small bowel dilatation. 2. Significant residual contrast, now progressed into distal colon. Electronically Signed   By: Nolon Nations M.D.   On: 01/31/2016 10:38   Dg Abd 2 Views  01/30/2016  CLINICAL DATA:  40 year old female with lower abdominal pain, nausea and low-grade fever for 1 week. Small bowel obstruction. EXAM: ABDOMEN - 2 VIEW COMPARISON:  Prior abdominal radiograph 01/29/2016 FINDINGS: Nasogastric tube remains in good position. Persistent diffuse dilatation of multiple loops of small bowel with air-fluid levels in the upper abdomen. There has been mild interval distal progression of contrast material into the ascending colon to the level of the hepatic flexure. The lung bases are clear. No evidence of free air. IMPRESSION: 1. Persistent partial small bowel obstruction without significant interval change. 2. Slight continued progression of contrast material into the ascending colon. Electronically Signed   By: Jacqulynn Cadet M.D.   On: 01/30/2016 09:23   Dg Abd 2 Views  01/29/2016  CLINICAL DATA:  Lower abdominal pain, nausea, vomiting and diarrhea for 2  days. EXAM: ABDOMEN - 2 VIEW COMPARISON:  Abdominal radiographs January 12, 2016 FINDINGS: Scattered mildly gas distended small bowel air-fluid levels, paucity of large bowel gas. No intra-abdominal mass effect or pathologic calcification. Phleboliths LEFT pelvis. Lung bases are clear. Lower thoracic dextroscoliosis. IMPRESSION: Small bowel air-fluid levels, paucity of large bowel gas concerning for small bowel obstruction. Electronically Signed   By: Elon Alas M.D.   On: 01/29/2016 01:42   Dg Abd 2 Views  01/12/2016  CLINICAL DATA:  Low abdominal pain and constipation for the last week. EXAM: ABDOMEN - 2 VIEW COMPARISON:  01/10/2016 FINDINGS: The bowel gas pattern is nonobstructive. Multiple gas fluid levels are seen within non pathologically distended small and large bowel loops. No  radiographic evidence of organomegaly. There is no evidence of free air. No radio-opaque calculi or other significant radiographic abnormality is seen. IMPRESSION: Multiple gas fluid levels within non pathologically distended small and large bowel loops. Findings are suggestive of enteritis/colitis. Electronically Signed   By: Fidela Salisbury M.D.   On: 01/12/2016 07:40   Dg Abd Acute W/chest  02/07/2016  CLINICAL DATA:  Acute onset of lower abdominal pain, nausea and vomiting. Initial encounter. EXAM: DG ABDOMEN ACUTE W/ 1V CHEST COMPARISON:  Abdominal radiograph performed 02/05/2016, and chest radiograph performed 01/09/2016 FINDINGS: The lungs are well-aerated and clear. There is no evidence of focal opacification, pleural effusion or pneumothorax. The cardiomediastinal silhouette is within normal limits. Bilateral nipple shadows are noted. The visualized bowel gas pattern is unremarkable. Scattered fluid and air are seen within the small and large bowel; there is no evidence of small bowel dilatation to suggest obstruction. No free intra-abdominal air is identified on the provided upright view. No acute osseous abnormalities are seen; the sacroiliac joints are unremarkable in appearance. Foreign densities overlying the mid pelvis are thought to be associated with the patient's pants. IMPRESSION: 1. Scattered fluid-filled loops of small and large bowel noted within the abdomen, raising question for mild dysmotility. No free intra-abdominal air seen. 2. No acute cardiopulmonary process identified. Electronically Signed   By: Garald Balding M.D.   On: 02/07/2016 23:33   Dg Abd Portable 1v-small Bowel Obstruction Protocol-initial, 8 Hr Delay  02/09/2016  CLINICAL DATA:  Assess progression of contrast through the bowel. Initial encounter. EXAM: PORTABLE ABDOMEN - 1 VIEW COMPARISON:  Abdominal radiograph performed 02/07/2016 FINDINGS: 9 hours status post administration of oral contrast, contrast has  progressed to the colon. There is no evidence for bowel obstruction. The finding on the prior radiograph again likely reflects mild bowel dysmotility. Distended loops of small bowel measure up to 4.0 cm in diameter. No free intra-abdominal air is seen, though evaluation for free air is limited on a single supine view. The appendix is filled with contrast. No acute osseous abnormalities are identified. IMPRESSION: Contrast has progressed to the colon. No evidence for bowel obstruction. Distended loops of small bowel again noted, likely reflecting mild bowel dysmotility. Electronically Signed   By: Garald Balding M.D.   On: 02/09/2016 04:38   Dg Abd Portable 1v-small Bowel Obstruction Protocol-initial, 8 Hr Delay  01/29/2016  CLINICAL DATA:  Small bowel obstruction. Oral contrast given at 10:20 this morning via the patient's nasogastric tube. EXAM: PORTABLE ABDOMEN - 1 VIEW COMPARISON:  Abdomen radiographs and abdomen pelvis CT obtained earlier today. FINDINGS: Oral contrast in multiple mildly dilated small bowel loops. There is also oral contrast in normal caliber distal small bowel loops, right colon and hepatic flexure. Excreted contrast in the urinary  bladder. Nasogastric tube tip in the distal stomach. Mild scoliosis. IMPRESSION: Stable partial small bowel obstruction. Electronically Signed   By: Claudie Revering M.D.   On: 01/29/2016 18:44     CBC  Recent Labs Lab 02/04/16 0435 02/05/16 0438 02/07/16 2230 02/08/16 0546 02/09/16 0646  WBC 6.0 5.8 9.2 10.8* 4.4  HGB 9.9* 9.8* 10.8* 10.1* 9.3*  HCT 31.0* 30.9* 33.5* 31.5* 28.6*  PLT 252 262 233 265 231  MCV 90.9 91.2 86.6 87.0 88.0  MCH 29.0 28.9 27.9 27.9 28.6  MCHC 31.9 31.7 32.2 32.1 32.5  RDW 16.4* 16.5* 15.7* 16.0* 16.3*  LYMPHSABS  --   --  1.3  --   --   MONOABS  --   --  0.7  --   --   EOSABS  --   --  0.1  --   --   BASOSABS  --   --  0.0  --   --     Chemistries   Recent Labs Lab 02/03/16 0443 02/04/16 0435 02/05/16 0438  02/07/16 2230 02/08/16 0546 02/09/16 0646  NA 138 133* 138 136 136 138  K 3.4* 4.8 5.0 3.6 3.9 4.0  CL 102 105 107 101 101 108  CO2 26 19* 20* 24 22 25   GLUCOSE 118* 79 79 113* 131* 91  BUN <5* <5* <5* <5* <5* <5*  CREATININE 0.65 0.63 0.62 0.75 0.66 0.69  CALCIUM 9.2 8.8* 9.1 9.4 9.0 8.4*  MG 1.6* 2.2 1.8  --   --   --   AST  --   --   --  16  --  14*  ALT  --   --   --  9*  --  9*  ALKPHOS  --   --   --  46  --  41  BILITOT  --   --   --  0.2*  --  0.5   ------------------------------------------------------------------------------------------------------------------ estimated creatinine clearance is 89.6 mL/min (by C-G formula based on Cr of 0.69). ------------------------------------------------------------------------------------------------------------------ No results for input(s): HGBA1C in the last 72 hours. ------------------------------------------------------------------------------------------------------------------ No results for input(s): CHOL, HDL, LDLCALC, TRIG, CHOLHDL, LDLDIRECT in the last 72 hours. ------------------------------------------------------------------------------------------------------------------ No results for input(s): TSH, T4TOTAL, T3FREE, THYROIDAB in the last 72 hours.  Invalid input(s): FREET3 ------------------------------------------------------------------------------------------------------------------ No results for input(s): VITAMINB12, FOLATE, FERRITIN, TIBC, IRON, RETICCTPCT in the last 72 hours.  Coagulation profile No results for input(s): INR, PROTIME in the last 168 hours.  No results for input(s): DDIMER in the last 72 hours.  Cardiac Enzymes No results for input(s): CKMB, TROPONINI, MYOGLOBIN in the last 168 hours.  Invalid input(s): CK ------------------------------------------------------------------------------------------------------------------ Invalid input(s): POCBNP   CBG: No results for input(s): GLUCAP in  the last 168 hours.     Studies: Dg Abd Acute W/chest  02/07/2016  CLINICAL DATA:  Acute onset of lower abdominal pain, nausea and vomiting. Initial encounter. EXAM: DG ABDOMEN ACUTE W/ 1V CHEST COMPARISON:  Abdominal radiograph performed 02/05/2016, and chest radiograph performed 01/09/2016 FINDINGS: The lungs are well-aerated and clear. There is no evidence of focal opacification, pleural effusion or pneumothorax. The cardiomediastinal silhouette is within normal limits. Bilateral nipple shadows are noted. The visualized bowel gas pattern is unremarkable. Scattered fluid and air are seen within the small and large bowel; there is no evidence of small bowel dilatation to suggest obstruction. No free intra-abdominal air is identified on the provided upright view. No acute osseous abnormalities are seen; the sacroiliac joints are unremarkable in appearance. Foreign densities overlying  the mid pelvis are thought to be associated with the patient's pants. IMPRESSION: 1. Scattered fluid-filled loops of small and large bowel noted within the abdomen, raising question for mild dysmotility. No free intra-abdominal air seen. 2. No acute cardiopulmonary process identified. Electronically Signed   By: Garald Balding M.D.   On: 02/07/2016 23:33   Dg Abd Portable 1v-small Bowel Obstruction Protocol-initial, 8 Hr Delay  02/09/2016  CLINICAL DATA:  Assess progression of contrast through the bowel. Initial encounter. EXAM: PORTABLE ABDOMEN - 1 VIEW COMPARISON:  Abdominal radiograph performed 02/07/2016 FINDINGS: 9 hours status post administration of oral contrast, contrast has progressed to the colon. There is no evidence for bowel obstruction. The finding on the prior radiograph again likely reflects mild bowel dysmotility. Distended loops of small bowel measure up to 4.0 cm in diameter. No free intra-abdominal air is seen, though evaluation for free air is limited on a single supine view. The appendix is filled with  contrast. No acute osseous abnormalities are identified. IMPRESSION: Contrast has progressed to the colon. No evidence for bowel obstruction. Distended loops of small bowel again noted, likely reflecting mild bowel dysmotility. Electronically Signed   By: Garald Balding M.D.   On: 02/09/2016 04:38      No results found for: HGBA1C Lab Results  Component Value Date   CREATININE 0.69 02/09/2016       Scheduled Meds: . antiseptic oral rinse  7 mL Mouth Rinse BID  . enoxaparin (LOVENOX) injection  40 mg Subcutaneous Q24H  . nicotine  14 mg Transdermal Daily   Continuous Infusions: . 0.45 % NaCl with KCl 20 mEq / L 125 mL/hr at 02/09/16 1306    Principal Problem:   Ileus ([place]) Active Problems:   Tobacco abuse   Constipation   SBO (small bowel obstruction) ([place])   Chronic narcotic use    Time spent: 45 minutes   [facility] Pager [phone]. If 7PM-7AM, please contact night-coverage at [email or web address], password [facility] 02/09/2016, 1:08 PM  LOS: 1 day

## 2016-02-09 NOTE — Progress Notes (Signed)
Subjective: She had a BM this AM and feels better.  Objective: Vital signs in last 24 hours: Temp:  [98.8 F (37.1 C)-99.2 F (37.3 C)] 99.1 F (37.3 C) (02/28 0602) Pulse Rate:  [70-87] 74 (02/28 0602) Resp:  [16] 16 (02/28 0602) BP: (109-122)/(60-63) 118/60 mmHg (02/28 0602) SpO2:  [99 %-100 %] 99 % (02/28 0602) Last BM Date: 02/05/16 NPO 450 from NG 1850 urine Afebrile, VSS Labs OK Film from SB protocol:  Contrast has progressed to the colon. No evidence for bowel obstruction. Distended loops of small bowel again noted, likely reflecting mild bowel dysmotility. Intake/Output from previous day: 02/27 0701 - 02/28 0700 In: 3215 [I.V.:3125; NG/GT:90] Out: 2300 [Urine:1850; Emesis/NG output:450] Intake/Output this shift:    General appearance: alert, cooperative and no distress GI: soft, non-tender; bowel sounds normal; no masses,  no organomegaly  Lab Results:   Recent Labs  02/08/16 0546 02/09/16 0646  WBC 10.8* 4.4  HGB 10.1* 9.3*  HCT 31.5* 28.6*  PLT 265 231    BMET  Recent Labs  02/08/16 0546 02/09/16 0646  NA 136 138  K 3.9 4.0  CL 101 108  CO2 22 25  GLUCOSE 131* 91  BUN <5* <5*  CREATININE 0.66 0.69  CALCIUM 9.0 8.4*   PT/INR No results for input(s): LABPROT, INR in the last 72 hours.   Recent Labs Lab 02/07/16 2230 02/09/16 0646  AST 16 14*  ALT 9* 9*  ALKPHOS 46 41  BILITOT 0.2* 0.5  PROT 7.6 5.8*  ALBUMIN 3.9 2.8*     Lipase     Component Value Date/Time   LIPASE 16 02/07/2016 2230     Studies/Results: Dg Abd Acute W/chest  02/07/2016  CLINICAL DATA:  Acute onset of lower abdominal pain, nausea and vomiting. Initial encounter. EXAM: DG ABDOMEN ACUTE W/ 1V CHEST COMPARISON:  Abdominal radiograph performed 02/05/2016, and chest radiograph performed 01/09/2016 FINDINGS: The lungs are well-aerated and clear. There is no evidence of focal opacification, pleural effusion or pneumothorax. The cardiomediastinal silhouette is  within normal limits. Bilateral nipple shadows are noted. The visualized bowel gas pattern is unremarkable. Scattered fluid and air are seen within the small and large bowel; there is no evidence of small bowel dilatation to suggest obstruction. No free intra-abdominal air is identified on the provided upright view. No acute osseous abnormalities are seen; the sacroiliac joints are unremarkable in appearance. Foreign densities overlying the mid pelvis are thought to be associated with the patient's pants. IMPRESSION: 1. Scattered fluid-filled loops of small and large bowel noted within the abdomen, raising question for mild dysmotility. No free intra-abdominal air seen. 2. No acute cardiopulmonary process identified. Electronically Signed   By: Garald Balding M.D.   On: 02/07/2016 23:33   Dg Abd Portable 1v-small Bowel Obstruction Protocol-initial, 8 Hr Delay  02/09/2016  CLINICAL DATA:  Assess progression of contrast through the bowel. Initial encounter. EXAM: PORTABLE ABDOMEN - 1 VIEW COMPARISON:  Abdominal radiograph performed 02/07/2016 FINDINGS: 9 hours status post administration of oral contrast, contrast has progressed to the colon. There is no evidence for bowel obstruction. The finding on the prior radiograph again likely reflects mild bowel dysmotility. Distended loops of small bowel measure up to 4.0 cm in diameter. No free intra-abdominal air is seen, though evaluation for free air is limited on a single supine view. The appendix is filled with contrast. No acute osseous abnormalities are identified. IMPRESSION: Contrast has progressed to the colon. No evidence for bowel obstruction. Distended loops of  small bowel again noted, likely reflecting mild bowel dysmotility. Electronically Signed   By: Garald Balding M.D.   On: 02/09/2016 04:38    Medications: . antiseptic oral rinse  7 mL Mouth Rinse BID  . enoxaparin (LOVENOX) injection  40 mg Subcutaneous Q24H  . nicotine  14 mg Transdermal Daily     Assessment/Plan Partial SBO vs functional obstruction Possible colitis Chronic pain and narcotic use Fibroids Hx of C section/tubal ligation. Tobacco use Antibiotics:  None DVT:  SCD/Lovenox    Plan:  She had the contrast go thru without any problems.  I do not think an obstruction is her issue.  I would refer her back go GI and work on her motility issues, and narcotic use.  We will see again as needed.   LOS: 1 day    [name] 02/09/2016

## 2016-02-09 NOTE — Progress Notes (Signed)
Utilization review completed. Kimber Fritts, RN, BSN. 

## 2016-02-10 DIAGNOSIS — Z720 Tobacco use: Secondary | ICD-10-CM

## 2016-02-10 DIAGNOSIS — K5669 Other intestinal obstruction: Secondary | ICD-10-CM

## 2016-02-10 LAB — CBC
HCT: 28.2 % — ABNORMAL LOW (ref 36.0–46.0)
Hemoglobin: 8.8 g/dL — ABNORMAL LOW (ref 12.0–15.0)
MCH: 27.4 pg (ref 26.0–34.0)
MCHC: 31.2 g/dL (ref 30.0–36.0)
MCV: 87.9 fL (ref 78.0–100.0)
Platelets: 238 10*3/uL (ref 150–400)
RBC: 3.21 MIL/uL — ABNORMAL LOW (ref 3.87–5.11)
RDW: 16.1 % — ABNORMAL HIGH (ref 11.5–15.5)
WBC: 5.2 10*3/uL (ref 4.0–10.5)

## 2016-02-10 LAB — COMPREHENSIVE METABOLIC PANEL
ALT: 9 U/L — ABNORMAL LOW (ref 14–54)
AST: 11 U/L — ABNORMAL LOW (ref 15–41)
Albumin: 3 g/dL — ABNORMAL LOW (ref 3.5–5.0)
Alkaline Phosphatase: 42 U/L (ref 38–126)
Anion gap: 9 (ref 5–15)
BUN: 5 mg/dL — ABNORMAL LOW (ref 6–20)
CO2: 25 mmol/L (ref 22–32)
Calcium: 8.5 mg/dL — ABNORMAL LOW (ref 8.9–10.3)
Chloride: 104 mmol/L (ref 101–111)
Creatinine, Ser: 0.69 mg/dL (ref 0.44–1.00)
GFR calc Af Amer: >60 mL/min (ref >60)
GFR calc non Af Amer: >60 mL/min (ref >60)
Glucose, Bld: 71 mg/dL (ref 65–99)
Potassium: 3.8 mmol/L (ref 3.5–5.1)
Sodium: 138 mmol/L (ref 135–145)
Total Bilirubin: 0.4 mg/dL (ref 0.3–1.2)
Total Protein: 6 g/dL — ABNORMAL LOW (ref 6.5–8.1)

## 2016-02-10 MED ORDER — OXYCODONE-ACETAMINOPHEN 10-325 MG PO TABS: 1.0000 | ORAL_TABLET | ORAL | Status: DC | PRN

## 2016-02-10 MED ORDER — TRAMADOL HCL 50 MG PO TABS
100.0000 mg | ORAL_TABLET | Freq: Four times a day (QID) | ORAL | Status: DC | PRN
  Administered 2016-02-11: 100 mg via ORAL
  Filled 2016-02-10: qty 2

## 2016-02-10 MED ORDER — OXYCODONE HCL 5 MG PO TABS
5.0000 mg | ORAL_TABLET | ORAL | Status: DC | PRN
  Administered 2016-02-10 – 2016-02-11 (×6): 5 mg via ORAL
  Filled 2016-02-10 (×6): qty 1

## 2016-02-10 MED ORDER — OXYCODONE-ACETAMINOPHEN 5-325 MG PO TABS
1.0000 | ORAL_TABLET | ORAL | Status: DC | PRN
  Administered 2016-02-10 – 2016-02-11 (×6): 1 via ORAL
  Filled 2016-02-10 (×6): qty 1

## 2016-02-10 NOTE — Progress Notes (Signed)
[facility] PROGRESS NOTE  [name] [id number] DOB: 06-19-1976 DOA: 02/07/2016 PCP: [facility]  HPI/Brief narrative 40 year old female past medical history significant for C-section 4, iron deficiency anemia, uterine fibroids; who presented with complaints of abdominal pain She had nausea and vomiting, no evidence of hematemesis.   Assessment/Plan: Recurrent small bowel obstruction Was initially continued on NG tube Pt had trial of Reglan IV, suspect dysmotility, worsened by narcotic dependence, narcotics have been discontinued Small bowel follow-through showed contrast had progressed to the colon General surgery recommended further input from GI Patient sees Dr. Collene Mares as outpatient Cdiff neg Pt tolerated clamping of NG tube and is now successfully advancing diet as tolerated  Chronic iron deficiency anemia secondary to heavy menstrual periods, hemoglobin stable Baseline 9-10  Fever Low-grade fever of 100.1, chest x-ray, UA X  DVT prophylaxsis Lovenox  Code Status: Full Family Communication: Pt in room Disposition Plan: Possible d/c in 1-2 days   Consultants:  General Surgery  Procedures:    Antibiotics: Anti-infectives    None      HPI/Subjective: Eager to eat  Objective: Filed Vitals:   02/09/16 1320 02/09/16 2153 02/10/16 0436 02/10/16 1336  BP: 114/66 119/62 119/63 127/68  Pulse: 72 72 63 70  Temp: 98.9 F (37.2 C) 98.9 F (37.2 C) 98.5 F (36.9 C) 98.7 F (37.1 C)  TempSrc: Oral Oral Oral Oral  Resp: 16 18 19 18   Height:      Weight:      SpO2: 100% 100% 100% 100%    Intake/Output Summary (Last 24 hours) at 02/10/16 1703 Last data filed at 02/10/16 1409  Gross per 24 hour  Intake 4212.08 ml  Output   1050 ml  Net 3162.08 ml   Filed Weights   02/08/16 0209  Weight: 68.085 kg (150 lb 1.6 oz)    Exam:   General:  Awake, in nad  Cardiovascular: regular, s1, s2  Respiratory: normal resp effort, no  wheezing  Abdomen: soft, nondistended  Musculoskeletal: perfused, no clubbing   Data Reviewed: Basic Metabolic Panel:  Recent Labs Lab 02/04/16 0435 02/05/16 0438 02/07/16 2230 02/08/16 0546 02/09/16 0646 02/10/16 0548  NA 133* 138 136 136 138 138  K 4.8 5.0 3.6 3.9 4.0 3.8  CL 105 107 101 101 108 104  CO2 19* 20* 24 22 25 25   GLUCOSE 79 79 113* 131* 91 71  BUN <5* <5* <5* <5* <5* 5*  CREATININE 0.63 0.62 0.75 0.66 0.69 0.69  CALCIUM 8.8* 9.1 9.4 9.0 8.4* 8.5*  MG 2.2 1.8  --   --   --   --    Liver Function Tests:  Recent Labs Lab 02/07/16 2230 02/09/16 0646 02/10/16 0548  AST 16 14* 11*  ALT 9* 9* 9*  ALKPHOS 46 41 42  BILITOT 0.2* 0.5 0.4  PROT 7.6 5.8* 6.0*  ALBUMIN 3.9 2.8* 3.0*    Recent Labs Lab 02/07/16 2230  LIPASE 16   No results for input(s): AMMONIA in the last 168 hours. CBC:  Recent Labs Lab 02/05/16 0438 02/07/16 2230 02/08/16 0546 02/09/16 0646 02/10/16 0548  WBC 5.8 9.2 10.8* 4.4 5.2  NEUTROABS  --  7.1  --   --   --   HGB 9.8* 10.8* 10.1* 9.3* 8.8*  HCT 30.9* 33.5* 31.5* 28.6* 28.2*  MCV 91.2 86.6 87.0 88.0 87.9  PLT 262 233 265 231 238   Cardiac Enzymes: No results for input(s): CKTOTAL, CKMB, CKMBINDEX, TROPONINI in the last 168 hours.  BNP (last 3 results) No results for input(s): BNP in the last 8760 hours.  ProBNP (last 3 results) No results for input(s): PROBNP in the last 8760 hours.  CBG: No results for input(s): GLUCAP in the last 168 hours.  Recent Results (from the past 240 hour(s))  MRSA PCR Screening     Status: None   Collection Time: 02/04/16  6:35 AM  Result Value Ref Range Status   MRSA by PCR NEGATIVE NEGATIVE Final    Comment:        The GeneXpert MRSA Assay (FDA approved for NASAL specimens only), is one component of a comprehensive MRSA colonization surveillance program. It is not intended to diagnose MRSA infection nor to guide or monitor treatment for MRSA infections.   C difficile quick  scan w PCR reflex     Status: None   Collection Time: 02/09/16  5:54 PM  Result Value Ref Range Status   C Diff antigen NEGATIVE NEGATIVE Final   C Diff toxin NEGATIVE NEGATIVE Final   C Diff interpretation Negative for toxigenic C. difficile  Final     Studies: Dg Chest 2 View  02/09/2016  CLINICAL DATA:  Fever. EXAM: CHEST  2 VIEW COMPARISON:  February 07, 2016. FINDINGS: The heart size and mediastinal contours are within normal limits. Both lungs are clear. Nasogastric tube tip is seen in proximal stomach. No pneumothorax or pleural effusion is noted. Mild dextroscoliosis of lower thoracic spine is noted. IMPRESSION: No active cardiopulmonary disease. Electronically Signed   By: Marijo Conception, M.D.   On: 02/09/2016 15:22   Dg Abd 1 View  02/09/2016  CLINICAL DATA:  History of small bowel obstruction. Assess NG tube position. EXAM: ABDOMEN - 1 VIEW COMPARISON:  02/09/2016 at 4:28 a.m. and 02/07/2016, 02/05/2016 FINDINGS: NG tube is not significantly changed with tip over the stomach in the left upper quadrant and side-port in the expected region of the gastroesophageal junction. Contrast and air are present throughout the colon. There is an air-filled loop of dilated small bowel and left mid abdomen measuring 3.5 cm in diameter slightly improved. Less dilated small bowel loops compared to 02/07/2016. Remainder of the exam is unchanged. IMPRESSION: Single air-filled dilated small bowel loop in the left mid abdomen with air and contrast throughout the colon compatible with resolving small bowel obstruction. Nasogastric tube unchanged with tip over the stomach in the left upper quadrant and side-port in the expected region of the gastroesophageal junction. Electronically Signed   By: Marin Olp M.D.   On: 02/09/2016 15:33   Dg Abd Portable 1v-small Bowel Obstruction Protocol-initial, 8 Hr Delay  02/09/2016  CLINICAL DATA:  Assess progression of contrast through the bowel. Initial encounter. EXAM:  PORTABLE ABDOMEN - 1 VIEW COMPARISON:  Abdominal radiograph performed 02/07/2016 FINDINGS: 9 hours status post administration of oral contrast, contrast has progressed to the colon. There is no evidence for bowel obstruction. The finding on the prior radiograph again likely reflects mild bowel dysmotility. Distended loops of small bowel measure up to 4.0 cm in diameter. No free intra-abdominal air is seen, though evaluation for free air is limited on a single supine view. The appendix is filled with contrast. No acute osseous abnormalities are identified. IMPRESSION: Contrast has progressed to the colon. No evidence for bowel obstruction. Distended loops of small bowel again noted, likely reflecting mild bowel dysmotility. Electronically Signed   By: Garald Balding M.D.   On: 02/09/2016 04:38    Scheduled Meds: . antiseptic oral rinse  7 mL Mouth Rinse BID  . enoxaparin (LOVENOX) injection  40 mg Subcutaneous Q24H  . metoCLOPramide (REGLAN) injection  10 mg Intravenous 3 times per day  . nicotine  14 mg Transdermal Daily  . pantoprazole (PROTONIX) IV  40 mg Intravenous Q12H   Continuous Infusions: . 0.45 % NaCl with KCl 20 mEq / L Stopped (02/10/16 1409)    Principal Problem:   Ileus ([place]) Active Problems:   Tobacco abuse   Constipation   SBO (small bowel obstruction) ([place])   Chronic narcotic use    CHIU, STEPHEN K  [facility] Pager [phone]. If 7PM-7AM, please contact night-coverage at [email or web address], password [facility] 02/10/2016, 5:03 PM  LOS: 2 days

## 2016-02-10 NOTE — Progress Notes (Addendum)
Patient asking when she can eat. Patient stated she had 3 bowel movements yesterday. Patient tolerated having her NG tube clamped for 30 minutes this am while ambulating around unit. Dr. Wyline Copas made aware and verbal order to clamp NG tube and advance diet to clears.   2:09 PM Verbal order to d/c NG tube and stop IV fluids.

## 2016-02-11 MED ORDER — PANTOPRAZOLE SODIUM 40 MG PO TBEC: 40.0000 mg | DELAYED_RELEASE_TABLET | Freq: Every day | ORAL | Status: DC

## 2016-02-11 MED ORDER — METOCLOPRAMIDE HCL 5 MG PO TABS: 5.0000 mg | ORAL_TABLET | Freq: Three times a day (TID) | ORAL | Status: DC

## 2016-02-11 NOTE — Discharge Summary (Signed)
Physician Discharge Summary  [name] [id number] DOB: [birth year] DOA: 02/07/2016  PCP: [facility]  Admit date: 02/07/2016 Discharge date: 02/11/2016  Time spent: 20 minutes  Recommendations for Outpatient Follow-up:  1. Follow up with PCP in 2-3 weeks   Discharge Diagnoses:  Principal Problem:   Ileus ([place]) Active Problems:   Tobacco abuse   Constipation   SBO (small bowel obstruction) ([place])   Chronic narcotic use   Discharge Condition: Improved  Diet recommendation: Soft, advance to regular as tolerated  Filed Weights   02/08/16 0209  Weight: 68.085 kg (150 lb 1.6 oz)    History of present illness:  Please review dictated H and P from 2/26 for details. Briefly, 40 year old female past medical history significant for C-section 4, iron deficiency anemia, uterine fibroids; who presented with complaints of abdominal pain She had nausea and vomiting, no evidence of hematemesis. This is the patient's 6th evaluation for abdominal pain since 12/29/2015. Patient was recently discharged 2/16 to 2/24, for small bowel obstruction that resolved with supportive care. General surgery was consulted, they felt that narcotics were playing a large role. Patient's diet was subsequently advanced. NG tube was discontinued and the patient was discharged home. Patient also found to have electrolyte abnormalities which were corrected.She was close to going to the OR last week but then had diarrhea and was sent home on 02/05/16.  Hospital Course:  Recurrent small bowel obstruction Was initially continued on NG tube Suspect dysmotility, worsened by narcotic dependence, narcotics were limited and trial of reglan started Small bowel follow-through showed contrast had progressed to the colon General surgery initially recommended further input from GI Patient sees Dr. Collene Mares as outpatient Cdiff neg Pt tolerated clamping of NG tube and is has since successfully advanced to a soft  diet  Chronic iron deficiency anemia secondary to heavy menstrual periods, hemoglobin stable Baseline 9-10  Fever Low-grade fever of 100.1 UA clear CXR unremarkable Patient has since remained afebrile with no source of infection  DVT prophylaxsis Lovenox while admitted  Consultations:  General Surgery  Discharge Exam: Filed Vitals:   02/10/16 1336 02/10/16 2309 02/11/16 0526 02/11/16 0533  BP: 127/68 117/68  113/59  Pulse: 70 52  64  Temp: 98.7 F (37.1 C) 98.1 F (36.7 C)  98 F (36.7 C)  TempSrc: Oral Oral Oral Oral  Resp: 18 18 18 18   Height:      Weight:      SpO2: 100% 98%  99%    General: Awake, in nad Cardiovascular: regular, s1, s2 Respiratory: normal resp effort, no wheezing  Discharge Instructions     Medication List    TAKE these medications        metoCLOPramide 5 MG tablet  Commonly known as:  REGLAN  Take 1 tablet (5 mg total) by mouth 3 (three) times daily before meals.     oxyCODONE-acetaminophen 10-325 MG tablet  Commonly known as:  PERCOCET  Take 1 tablet by mouth every 4 (four) hours as needed for pain.     pantoprazole 40 MG tablet  Commonly known as:  PROTONIX  Take 1 tablet (40 mg total) by mouth daily.     polyethylene glycol powder powder  Commonly known as:  GLYCOLAX/MIRALAX  1 capful daily as needed for constipation     saccharomyces boulardii 250 MG capsule  Commonly known as:  FLORASTOR  Take 1 capsule (250 mg total) by mouth 2 (two) times daily.     traMADol 100 MG 24 hr  tablet  Commonly known as:  ULTRAM-ER  Take 1 tablet (100 mg total) by mouth every 6 (six) hours as needed for pain.       No Known Allergies Follow-up Information    Follow up with [facility]. Schedule an appointment as soon as possible for a visit in 2 weeks.   Specialty:  Internal Medicine   Why:  Hospital follow up   Contact information:   [place] [address] [phone]       Schedule an appointment as soon  as possible for a visit with Juanita Craver, MD.   Specialty:  Gastroenterology   Why:  Hospital follow up   Contact information:   [address], [place] [address] [id number]        The results of significant diagnostics from this hospitalization (including imaging, microbiology, ancillary and laboratory) are listed below for reference.    Significant Diagnostic Studies: Dg Chest 2 View  02/09/2016  CLINICAL DATA:  Fever. EXAM: CHEST  2 VIEW COMPARISON:  February 07, 2016. FINDINGS: The heart size and mediastinal contours are within normal limits. Both lungs are clear. Nasogastric tube tip is seen in proximal stomach. No pneumothorax or pleural effusion is noted. Mild dextroscoliosis of lower thoracic spine is noted. IMPRESSION: No active cardiopulmonary disease. Electronically Signed   By: Marijo Conception, M.D.   On: 02/09/2016 15:22   Dg Abd 1 View  02/09/2016  CLINICAL DATA:  History of small bowel obstruction. Assess NG tube position. EXAM: ABDOMEN - 1 VIEW COMPARISON:  02/09/2016 at 4:28 a.m. and 02/07/2016, 02/05/2016 FINDINGS: NG tube is not significantly changed with tip over the stomach in the left upper quadrant and side-port in the expected region of the gastroesophageal junction. Contrast and air are present throughout the colon. There is an air-filled loop of dilated small bowel and left mid abdomen measuring 3.5 cm in diameter slightly improved. Less dilated small bowel loops compared to 02/07/2016. Remainder of the exam is unchanged. IMPRESSION: Single air-filled dilated small bowel loop in the left mid abdomen with air and contrast throughout the colon compatible with resolving small bowel obstruction. Nasogastric tube unchanged with tip over the stomach in the left upper quadrant and side-port in the expected region of the gastroesophageal junction. Electronically Signed   By: Marin Olp M.D.   On: 02/09/2016 15:33   Ct Abdomen Pelvis W  Contrast  01/29/2016  CLINICAL DATA:  Lower abdominal pain, nausea and vomiting, and diarrhea for 2 weeks. EXAM: CT ABDOMEN AND PELVIS WITH CONTRAST TECHNIQUE: Multidetector CT imaging of the abdomen and pelvis was performed using the standard protocol following bolus administration of intravenous contrast. CONTRAST:  155mL OMNIPAQUE IOHEXOL 300 MG/ML  SOLN COMPARISON:  01/08/2016 FINDINGS: Mild dependent changes in the lung bases. The liver, spleen, gallbladder, pancreas, adrenal glands, abdominal aorta, inferior vena cava, and retroperitoneal lymph nodes are unremarkable. Cyst in the right kidney. Renal nephrograms are symmetrical. No hydronephrosis. Stomach is decompressed. Dilated fluid-filled small bowel with decompressed terminal ileum. Transition zone is difficult to localize but appears to be in the left lower abdomen. Cause is indeterminate. The colon is decompressed but there is evidence of edema and stranding in the fat around the colon which may indicate changes of colitis. No free air in the abdomen. No pneumatosis or portal venous gas. Pelvis: The uterus is enlarged suggesting recent postpartum state. Anterior defect in the lower uterine wall suggesting a C-section scar. Small amount of free fluid  in the pelvis is likely reactive. Bladder wall is mildly thickened suggesting possible cystitis. Mild degenerative changes in the spine. No destructive bone lesions. IMPRESSION: Dilated fluid-filled small bowel with decompressed distal small bowel and transition zone probably in the left abdomen. Changes are consistent with small bowel obstruction of nonspecific etiology. Colon is decompressed but there is evidence of fluid and infiltration around the colon suggesting colitis. Small amount of free fluid in the pelvis is likely reactive. Bladder wall thickening suggests cystitis. Presumed recent postpartum changes in the uterus consistent with C-section. Electronically Signed   By: Lucienne Capers M.D.   On:  01/29/2016 04:08   Dg Abd 2 Views  02/05/2016  CLINICAL DATA:  Follow-up small bowel obstruction EXAM: ABDOMEN - 2 VIEW COMPARISON:  Supine and upright abdominal films of February 04, 2016 FINDINGS: There remain loops of fluid and gas-filled small bowel centered in the mid and left aspect of the abdomen. Contrast and gas are present within the ascending and proximal transverse colons as well as the rectosigmoid. No free extraluminal gas collections are observed. The bony structures exhibit no acute abnormality. There is stable curvature of the thoracic and upper lumbar spine with convexity toward the right. IMPRESSION: Persistent relatively high-grade mid to distal small bowel obstruction. Previously DIS administered oral contrast has transited more of the colon. There is no evidence of obstruction. Electronically Signed   By: David  Martinique M.D.   On: 02/05/2016 07:22   Dg Abd 2 Views  02/04/2016  CLINICAL DATA:  Small bowel obstruction. Possible surgery this morning. EXAM: ABDOMEN - 2 VIEW COMPARISON:  02/03/2016 FINDINGS: Unchanged bowel gas pattern with small more than large bowel distention. Small bowel distention is improved compared to yesterday. Oral contrast still seen in the proximal colon, with interval emptying of the distal colon and rectum. No concerning intra-abdominal mass effect or calcification. No pneumoperitoneum. Lung bases are clear. IMPRESSION: Partial small bowel obstruction pattern. Small bowel distention is improved from yesterday. Electronically Signed   By: Monte Fantasia M.D.   On: 02/04/2016 08:05   Dg Abd 2 Views  02/03/2016  CLINICAL DATA:  Follow-up small bowel obstruction EXAM: ABDOMEN - 2 VIEW COMPARISON:  02/02/2016 FINDINGS: There is slight worsening of small bowel distension with air-fluid levels in upper abdomen consistent with partial small bowel obstruction. Again noted contrast material throughout the colon. NG tube has been removed. There is no evidence of free  abdominal air. IMPRESSION: There is slight worsening small bowel distension with air-fluid levels in mid and upper abdomen consistent with at least partial small bowel obstruction. Again noted contrast material throughout the colon. NG tube has been removed. Electronically Signed   By: Lahoma Crocker M.D.   On: 02/03/2016 08:45   Dg Abd 2 Views  02/02/2016  CLINICAL DATA:  Small-bowel obstruction and abdominal pain EXAM: ABDOMEN - 2 VIEW COMPARISON:  02/01/2016 FINDINGS: Scattered large and small bowel gas is noted. Contrast material is noted throughout the colon from a recent CT. Nasogastric catheter remains within the stomach. Persistent mild small bowel dilatation is seen. The overall appearance has improved slightly in the interval from the prior exam. No free air is seen. No other focal abnormality is noted. IMPRESSION: Slight improvement in the degree of small bowel dilatation when compared with the prior study. Electronically Signed   By: Inez Catalina M.D.   On: 02/02/2016 08:28   Dg Abd 2 Views  02/01/2016  CLINICAL DATA:  Small bowel obstruction EXAM: ABDOMEN - 2  VIEW COMPARISON:  01/31/2016 FINDINGS: NG tube remains in stable position. Oral contrast material noted within the colon. Dilated small bowel loops in the mid abdomen compatible with partial small bowel obstruction. No free air organomegaly. IMPRESSION: Continued partial small bowel obstruction pattern.  No real change. Electronically Signed   By: Rolm Baptise M.D.   On: 02/01/2016 09:03   Dg Abd 2 Views  01/31/2016  CLINICAL DATA:  PT C/O CONTINUED ABD PAIN, WEAKNESS, STATES SHE HAS HAD CONSTIPATION X 1 WK, PRIOR SBO, PT HAD BM IN THE NIGHT. EXAM: ABDOMEN - 2 VIEW COMPARISON:  01/30/2016 FINDINGS: Nasogastric tube is in place, tip overlying the level of the distal stomach. Gush persistent mild dilatation of small bowel loops the dilatation has improved. There has been some progression of contrast from small bowel loops to the colon. Air-fluid  levels versus within mildly dilated loops of small bowel, also slightly improved. IMPRESSION: 1. Decreased small bowel dilatation. 2. Significant residual contrast, now progressed into distal colon. Electronically Signed   By: Nolon Nations M.D.   On: 01/31/2016 10:38   Dg Abd 2 Views  01/30/2016  CLINICAL DATA:  40 year old female with lower abdominal pain, nausea and low-grade fever for 1 week. Small bowel obstruction. EXAM: ABDOMEN - 2 VIEW COMPARISON:  Prior abdominal radiograph 01/29/2016 FINDINGS: Nasogastric tube remains in good position. Persistent diffuse dilatation of multiple loops of small bowel with air-fluid levels in the upper abdomen. There has been mild interval distal progression of contrast material into the ascending colon to the level of the hepatic flexure. The lung bases are clear. No evidence of free air. IMPRESSION: 1. Persistent partial small bowel obstruction without significant interval change. 2. Slight continued progression of contrast material into the ascending colon. Electronically Signed   By: Jacqulynn Cadet M.D.   On: 01/30/2016 09:23   Dg Abd 2 Views  01/29/2016  CLINICAL DATA:  Lower abdominal pain, nausea, vomiting and diarrhea for 2 days. EXAM: ABDOMEN - 2 VIEW COMPARISON:  Abdominal radiographs January 12, 2016 FINDINGS: Scattered mildly gas distended small bowel air-fluid levels, paucity of large bowel gas. No intra-abdominal mass effect or pathologic calcification. Phleboliths LEFT pelvis. Lung bases are clear. Lower thoracic dextroscoliosis. IMPRESSION: Small bowel air-fluid levels, paucity of large bowel gas concerning for small bowel obstruction. Electronically Signed   By: Elon Alas M.D.   On: 01/29/2016 01:42   Dg Abd Acute W/chest  02/07/2016  CLINICAL DATA:  Acute onset of lower abdominal pain, nausea and vomiting. Initial encounter. EXAM: DG ABDOMEN ACUTE W/ 1V CHEST COMPARISON:  Abdominal radiograph performed 02/05/2016, and chest radiograph  performed 01/09/2016 FINDINGS: The lungs are well-aerated and clear. There is no evidence of focal opacification, pleural effusion or pneumothorax. The cardiomediastinal silhouette is within normal limits. Bilateral nipple shadows are noted. The visualized bowel gas pattern is unremarkable. Scattered fluid and air are seen within the small and large bowel; there is no evidence of small bowel dilatation to suggest obstruction. No free intra-abdominal air is identified on the provided upright view. No acute osseous abnormalities are seen; the sacroiliac joints are unremarkable in appearance. Foreign densities overlying the mid pelvis are thought to be associated with the patient's pants. IMPRESSION: 1. Scattered fluid-filled loops of small and large bowel noted within the abdomen, raising question for mild dysmotility. No free intra-abdominal air seen. 2. No acute cardiopulmonary process identified. Electronically Signed   By: Garald Balding M.D.   On: 02/07/2016 23:33   Dg Abd Portable 1v-small  Bowel Obstruction Protocol-initial, 8 Hr Delay  02/09/2016  CLINICAL DATA:  Assess progression of contrast through the bowel. Initial encounter. EXAM: PORTABLE ABDOMEN - 1 VIEW COMPARISON:  Abdominal radiograph performed 02/07/2016 FINDINGS: 9 hours status post administration of oral contrast, contrast has progressed to the colon. There is no evidence for bowel obstruction. The finding on the prior radiograph again likely reflects mild bowel dysmotility. Distended loops of small bowel measure up to 4.0 cm in diameter. No free intra-abdominal air is seen, though evaluation for free air is limited on a single supine view. The appendix is filled with contrast. No acute osseous abnormalities are identified. IMPRESSION: Contrast has progressed to the colon. No evidence for bowel obstruction. Distended loops of small bowel again noted, likely reflecting mild bowel dysmotility. Electronically Signed   By: Garald Balding M.D.   On:  02/09/2016 04:38   Dg Abd Portable 1v-small Bowel Obstruction Protocol-initial, 8 Hr Delay  01/29/2016  CLINICAL DATA:  Small bowel obstruction. Oral contrast given at 10:20 this morning via the patient's nasogastric tube. EXAM: PORTABLE ABDOMEN - 1 VIEW COMPARISON:  Abdomen radiographs and abdomen pelvis CT obtained earlier today. FINDINGS: Oral contrast in multiple mildly dilated small bowel loops. There is also oral contrast in normal caliber distal small bowel loops, right colon and hepatic flexure. Excreted contrast in the urinary bladder. Nasogastric tube tip in the distal stomach. Mild scoliosis. IMPRESSION: Stable partial small bowel obstruction. Electronically Signed   By: Claudie Revering M.D.   On: 01/29/2016 18:44    Microbiology: Recent Results (from the past 240 hour(s))  MRSA PCR Screening     Status: None   Collection Time: 02/04/16  6:35 AM  Result Value Ref Range Status   MRSA by PCR NEGATIVE NEGATIVE Final    Comment:        The GeneXpert MRSA Assay (FDA approved for NASAL specimens only), is one component of a comprehensive MRSA colonization surveillance program. It is not intended to diagnose MRSA infection nor to guide or monitor treatment for MRSA infections.   C difficile quick scan w PCR reflex     Status: None   Collection Time: 02/09/16  5:54 PM  Result Value Ref Range Status   C Diff antigen NEGATIVE NEGATIVE Final   C Diff toxin NEGATIVE NEGATIVE Final   C Diff interpretation Negative for toxigenic C. difficile  Final     Labs: Basic Metabolic Panel:  Recent Labs Lab 02/05/16 0438 02/07/16 2230 02/08/16 0546 02/09/16 0646 02/10/16 0548  NA 138 136 136 138 138  K 5.0 3.6 3.9 4.0 3.8  CL 107 101 101 108 104  CO2 20* 24 22 25 25   GLUCOSE 79 113* 131* 91 71  BUN <5* <5* <5* <5* 5*  CREATININE 0.62 0.75 0.66 0.69 0.69  CALCIUM 9.1 9.4 9.0 8.4* 8.5*  MG 1.8  --   --   --   --    Liver Function Tests:  Recent Labs Lab 02/07/16 2230  02/09/16 0646 02/10/16 0548  AST 16 14* 11*  ALT 9* 9* 9*  ALKPHOS 46 41 42  BILITOT 0.2* 0.5 0.4  PROT 7.6 5.8* 6.0*  ALBUMIN 3.9 2.8* 3.0*    Recent Labs Lab 02/07/16 2230  LIPASE 16   No results for input(s): AMMONIA in the last 168 hours. CBC:  Recent Labs Lab 02/05/16 0438 02/07/16 2230 02/08/16 0546 02/09/16 0646 02/10/16 0548  WBC 5.8 9.2 10.8* 4.4 5.2  NEUTROABS  --  7.1  --   --   --  HGB 9.8* 10.8* 10.1* 9.3* 8.8*  HCT 30.9* 33.5* 31.5* 28.6* 28.2*  MCV 91.2 86.6 87.0 88.0 87.9  PLT 262 233 265 231 238   Cardiac Enzymes: No results for input(s): CKTOTAL, CKMB, CKMBINDEX, TROPONINI in the last 168 hours. BNP: BNP (last 3 results) No results for input(s): BNP in the last 8760 hours.  ProBNP (last 3 results) No results for input(s): PROBNP in the last 8760 hours.  CBG: No results for input(s): GLUCAP in the last 168 hours.   Signed:  CHIU, STEPHEN K  [facility] 02/11/2016, 1:05 PM

## 2016-02-11 NOTE — Progress Notes (Signed)
[name] to be D/C'd  per MD order. Discussed with the patient and all questions fully answered.  VSS, Skin clean, dry and intact without evidence of skin break down, no evidence of skin tears noted.  IV catheter discontinued intact. Site without signs and symptoms of complications. Dressing and pressure applied.  An After Visit Summary was printed and given to the patient. Patient received prescription.  D/c education completed with patient/family including follow up instructions, medication list, d/c activities limitations if indicated, with other d/c instructions as indicated by MD - patient able to verbalize understanding, all questions fully answered.   Patient instructed to return to ED, call 911, or call MD for any changes in condition.   Patient to be escorted via [place], and D/C home via private auto.

## 2016-02-16 ENCOUNTER — Inpatient Hospital Stay (HOSPITAL_COMMUNITY)
Admission: EM | Admit: 2016-02-16 | Discharge: 2016-02-19 | DRG: 389 | Disposition: A | Discharge status: Home / Self Care | Payer: Medicaid Other | Attending: Internal Medicine | Admitting: Internal Medicine

## 2016-02-16 ENCOUNTER — Emergency Department (HOSPITAL_COMMUNITY): Payer: Medicaid Other

## 2016-02-16 ENCOUNTER — Encounter (HOSPITAL_COMMUNITY): Payer: Self-pay | Admitting: Emergency Medicine

## 2016-02-16 DIAGNOSIS — N920 Excessive and frequent menstruation with regular cycle: Secondary | ICD-10-CM | POA: Diagnosis present

## 2016-02-16 DIAGNOSIS — K56609 Unspecified intestinal obstruction, unspecified as to partial versus complete obstruction: Secondary | ICD-10-CM

## 2016-02-16 DIAGNOSIS — F1120 Opioid dependence, uncomplicated: Secondary | ICD-10-CM | POA: Diagnosis present

## 2016-02-16 DIAGNOSIS — Z8249 Family history of ischemic heart disease and other diseases of the circulatory system: Secondary | ICD-10-CM | POA: Diagnosis not present

## 2016-02-16 DIAGNOSIS — R112 Nausea with vomiting, unspecified: Secondary | ICD-10-CM | POA: Diagnosis present

## 2016-02-16 DIAGNOSIS — D259 Leiomyoma of uterus, unspecified: Secondary | ICD-10-CM | POA: Diagnosis present

## 2016-02-16 DIAGNOSIS — K5909 Other constipation: Secondary | ICD-10-CM | POA: Diagnosis present

## 2016-02-16 DIAGNOSIS — F1190 Opioid use, unspecified, uncomplicated: Secondary | ICD-10-CM | POA: Diagnosis present

## 2016-02-16 DIAGNOSIS — R1084 Generalized abdominal pain: Secondary | ICD-10-CM | POA: Diagnosis not present

## 2016-02-16 DIAGNOSIS — G8929 Other chronic pain: Secondary | ICD-10-CM | POA: Diagnosis present

## 2016-02-16 DIAGNOSIS — F17210 Nicotine dependence, cigarettes, uncomplicated: Secondary | ICD-10-CM | POA: Diagnosis present

## 2016-02-16 DIAGNOSIS — Z833 Family history of diabetes mellitus: Secondary | ICD-10-CM | POA: Diagnosis not present

## 2016-02-16 DIAGNOSIS — K5660 Unspecified intestinal obstruction: Secondary | ICD-10-CM | POA: Diagnosis present

## 2016-02-16 DIAGNOSIS — K567 Ileus, unspecified: Secondary | ICD-10-CM | POA: Diagnosis present

## 2016-02-16 DIAGNOSIS — M549 Dorsalgia, unspecified: Secondary | ICD-10-CM | POA: Diagnosis present

## 2016-02-16 DIAGNOSIS — K5904 Chronic idiopathic constipation: Secondary | ICD-10-CM | POA: Diagnosis present

## 2016-02-16 DIAGNOSIS — D649 Anemia, unspecified: Secondary | ICD-10-CM | POA: Diagnosis present

## 2016-02-16 DIAGNOSIS — Z79899 Other long term (current) drug therapy: Secondary | ICD-10-CM

## 2016-02-16 DIAGNOSIS — K5669 Other intestinal obstruction: Secondary | ICD-10-CM | POA: Diagnosis not present

## 2016-02-16 LAB — CBC
HCT: 34.8 % — ABNORMAL LOW (ref 36.0–46.0)
Hemoglobin: 11.2 g/dL — ABNORMAL LOW (ref 12.0–15.0)
MCH: 28.6 pg (ref 26.0–34.0)
MCHC: 32.2 g/dL (ref 30.0–36.0)
MCV: 89 fL (ref 78.0–100.0)
Platelets: 334 10*3/uL (ref 150–400)
RBC: 3.91 MIL/uL (ref 3.87–5.11)
RDW: 16.4 % — ABNORMAL HIGH (ref 11.5–15.5)
WBC: 8.7 10*3/uL (ref 4.0–10.5)

## 2016-02-16 LAB — URINALYSIS, ROUTINE W REFLEX MICROSCOPIC
Bilirubin Urine: NEGATIVE
Glucose, UA: NEGATIVE mg/dL
Hgb urine dipstick: NEGATIVE
Ketones, ur: NEGATIVE mg/dL
Leukocytes, UA: NEGATIVE
Nitrite: NEGATIVE
Protein, ur: NEGATIVE mg/dL
Specific Gravity, Urine: 1.019 (ref 1.005–1.030)
pH: 8.5 — ABNORMAL HIGH (ref 5.0–8.0)

## 2016-02-16 LAB — COMPREHENSIVE METABOLIC PANEL
ALT: 11 U/L — ABNORMAL LOW (ref 14–54)
AST: 16 U/L (ref 15–41)
Albumin: 4.5 g/dL (ref 3.5–5.0)
Alkaline Phosphatase: 69 U/L (ref 38–126)
Anion gap: 11 (ref 5–15)
BUN: 5 mg/dL — ABNORMAL LOW (ref 6–20)
CO2: 24 mmol/L (ref 22–32)
Calcium: 10 mg/dL (ref 8.9–10.3)
Chloride: 103 mmol/L (ref 101–111)
Creatinine, Ser: 0.52 mg/dL (ref 0.44–1.00)
GFR calc Af Amer: >60 mL/min (ref >60)
GFR calc non Af Amer: >60 mL/min (ref >60)
Glucose, Bld: 114 mg/dL — ABNORMAL HIGH (ref 65–99)
Potassium: 4 mmol/L (ref 3.5–5.1)
Sodium: 138 mmol/L (ref 135–145)
Total Bilirubin: 0.7 mg/dL (ref 0.3–1.2)
Total Protein: 8.4 g/dL — ABNORMAL HIGH (ref 6.5–8.1)

## 2016-02-16 LAB — LIPASE, BLOOD: Lipase: 17 U/L (ref 11–51)

## 2016-02-16 MED ORDER — SODIUM CHLORIDE 0.9 % IV SOLN
INTRAVENOUS | Status: DC
Start: 2016-02-16 — End: 2016-02-17
  Administered 2016-02-16: 23:00:00 via INTRAVENOUS

## 2016-02-16 MED ORDER — HYDROMORPHONE HCL 1 MG/ML IJ SOLN
1.0000 mg | Freq: Once | INTRAMUSCULAR | Status: AC
  Administered 2016-02-16: 1 mg via INTRAVENOUS
  Filled 2016-02-16: qty 1

## 2016-02-16 MED ORDER — SODIUM CHLORIDE 0.9 % IV BOLUS (SEPSIS)
1000.0000 mL | Freq: Once | INTRAVENOUS | Status: AC
Start: 2016-02-16 — End: 2016-02-16
  Administered 2016-02-16: 1000 mL via INTRAVENOUS

## 2016-02-16 MED ORDER — MORPHINE SULFATE (PF) 2 MG/ML IV SOLN
1.0000 mg | INTRAVENOUS | Status: DC | PRN
  Administered 2016-02-16: 1 mg via INTRAVENOUS
  Filled 2016-02-16: qty 1

## 2016-02-16 MED ORDER — ONDANSETRON HCL 4 MG/2ML IJ SOLN
4.0000 mg | Freq: Once | INTRAMUSCULAR | Status: AC
  Administered 2016-02-16: 4 mg via INTRAVENOUS
  Filled 2016-02-16: qty 2

## 2016-02-16 NOTE — ED Notes (Signed)
Pt stated "I was d/c'd last Friday.  They were wanting to do surgery on me but my bowels unblocked themselves.  This has been going x 1 month."

## 2016-02-16 NOTE — H&P (Signed)
[facility] History and Physical  [name] [id number] DOB: 11-Feb-1976 DOA: 02/16/2016  Referring physician:  PCP: [facility]   Chief Complaint:   HPI: [name] is a 40 y.o. 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 She was recently admitted from 02/07/2016 through 02/11/2016 treated for small bowel obstruction. During that hospitalization an NG tube was placed as general surgery was following. She returns to the emergency department today with complaints of abdominal pain. Reports symptoms started yesterday evening having several episodes of nausea and vomiting. This was associated with generalized abdominal pain as well as some abdominal distention. She denies fevers, chills, bloody stools hematemesis. States her last bowel movement was yesterday. Workup in the emergency department included an abdominal film that revealed low-grade/partial small bowel obstruction versus ileus.                      Review of Systems:  Constitutional:  No weight loss, night sweats, Fevers, chills, fatigue.  HEENT:  No headaches, Difficulty swallowing,Tooth/dental problems,Sore throat,  No sneezing, itching, ear ache, nasal congestion, post nasal drip,  Cardio-vascular:  No chest pain, Orthopnea, PND, swelling in lower extremities, anasarca, dizziness, palpitations  GI:  No heartburn, indigestion, positive for abdominal pain, nausea, vomiting, denies diarrhea, change in bowel habits, loss of appetite  Resp:  No shortness of breath with exertion or at rest. No excess mucus, no productive cough, No non-productive cough, No coughing up of blood.No change in color of mucus.No wheezing.No chest wall deformity  Skin:  no rash or lesions.  GU:  no dysuria, change in color of urine, no urgency or frequency. No flank pain.  Musculoskeletal:    No joint pain or swelling. No decreased range of motion. No back pain.  Psych:  No change in mood or affect. No depression or anxiety. No memory loss.   Past Medical History  Diagnosis Date  . Heart murmur   . Fibroids   . Anemia    Past Surgical History  Procedure Laterality Date  . Cesarean section    . Tubal ligation    . Ankle surgery     Social History:  reports that she has been smoking Cigarettes.  She has been smoking about 0.50 packs per day. She has never used smokeless tobacco. She reports that she does not drink alcohol or use illicit drugs.  No Known Allergies  Family History  Problem Relation Age of Onset  . Diabetes Maternal Grandmother   . Hypertension Maternal Aunt      Prior to Admission medications   Medication Sig Start Date End Date Taking? Authorizing Provider  metoCLOPramide (REGLAN) 5 MG tablet Take 1 tablet (5 mg total) by mouth 3 (three) times daily before meals. 02/11/16  Yes Donne Hazel, MD  oxyCODONE-acetaminophen (PERCOCET) 10-325 MG tablet Take 1 tablet by mouth every 4 (four) hours as needed for pain.   Yes Historical Provider, MD  pantoprazole (PROTONIX) 40 MG tablet Take 1 tablet (40 mg total) by mouth daily. 02/11/16  Yes Donne Hazel, MD  polyethylene glycol powder [facility]) powder 1 capful daily as needed for constipation Patient not taking: Reported on 01/28/2016 01/06/16   Luvenia Redden, PA-C  saccharomyces boulardii (FLORASTOR) 250 MG capsule Take 1 capsule (250 mg total) by mouth 2 (two) times daily. Patient not taking: Reported on 01/28/2016 01/12/16   Frederich Chick  Toney Sang, MD  traMADol (ULTRAM-ER) 100 MG 24 hr tablet Take 1 tablet (100 mg total) by mouth every 6 (six) hours as needed for pain. Patient not taking: Reported on 02/16/2016 02/05/16   Eugenie Filler, MD   Physical Exam: Filed Vitals:   02/16/16 1213 02/16/16 1819  BP: 144/81 136/85  Pulse: 89 87  Temp: 98.7 F (37.1 C) 99.4 F (37.4 C)  TempSrc: Oral Oral  Resp: 18  18  Height: 5\' 4"  (1.626 m)   Weight: 68.04 kg (150 lb)   SpO2: 100% 100%    Wt Readings from Last 3 Encounters:  02/16/16 68.04 kg (150 lb)  02/08/16 68.085 kg (150 lb 1.6 oz)  01/29/16 75.705 kg (166 lb 14.4 oz)    General:  Patient is nontoxic appearing, awake and alert oriented in no acute distress Eyes: PERRL, normal lids, irises & conjunctiva ENT: grossly normal hearing, lips & tongue Neck: no LAD, masses or thyromegaly Cardiovascular: RRR, no m/r/g. No LE edema. Telemetry: SR, no arrhythmias  Respiratory: CTA bilaterally, no w/r/r. Normal respiratory effort. Abdomen: There may be mild distention of abdomen, hypoactive bowel sounds, there is mild generalized tenderness to palpation. Skin: no rash or induration seen on limited exam Musculoskeletal: grossly normal tone BUE/BLE Psychiatric: grossly normal mood and affect, speech fluent and appropriate Neurologic: grossly non-focal.          Labs on Admission:  Basic Metabolic Panel:  Recent Labs Lab 02/10/16 0548 02/16/16 1236  NA 138 138  K 3.8 4.0  CL 104 103  CO2 25 24  GLUCOSE 71 114*  BUN 5* 5*  CREATININE 0.69 0.52  CALCIUM 8.5* 10.0   Liver Function Tests:  Recent Labs Lab 02/10/16 0548 02/16/16 1236  AST 11* 16  ALT 9* 11*  ALKPHOS 42 69  BILITOT 0.4 0.7  PROT 6.0* 8.4*  ALBUMIN 3.0* 4.5    Recent Labs Lab 02/16/16 1236  LIPASE 17   No results for input(s): AMMONIA in the last 168 hours. CBC:  Recent Labs Lab 02/10/16 0548 02/16/16 1236  WBC 5.2 8.7  HGB 8.8* 11.2*  HCT 28.2* 34.8*  MCV 87.9 89.0  PLT 238 334   Cardiac Enzymes: No results for input(s): CKTOTAL, CKMB, CKMBINDEX, TROPONINI in the last 168 hours.  BNP (last 3 results) No results for input(s): BNP in the last 8760 hours.  ProBNP (last 3 results) No results for input(s): PROBNP in the last 8760 hours.  CBG: No results for input(s): GLUCAP in the last 168 hours.  Radiological Exams on Admission: Dg Abd Acute  W/chest  02/16/2016  CLINICAL DATA:  Abdominal pain and constipation. Previous history of bowel obstruction. EXAM: DG ABDOMEN ACUTE W/ 1V CHEST COMPARISON:  02/09/2016 FINDINGS: A few mildly dilated small bowel loops containing air-fluid levels are seen within the left lower quadrant. There is also gas and stool throughout the colon. No evidence of free air. Left pelvic phlebolith noted. Heart size and mediastinal contours are normal. Both lungs are clear. Mild thoracolumbar dextroscoliosis noted. IMPRESSION: Low-grade/partial small bowel obstruction versus ileus. Continued radiographic followup recommended. No active cardiopulmonary disease. Electronically Signed   By: Earle Gell M.D.   On: 02/16/2016 16:37    EKG: Independently reviewed.   Assessment/Plan Principal Problem:   SBO (small bowel obstruction) (HCC) Active Problems:   Nausea with vomiting   Ileus (HCC)   Chronic narcotic use   1. Recurrent small bowel obstruction vs Ileus. 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 On previous hospitalizations it has not been clear if this is ileus related to chronic narcotics or recurring partial mechanical obstruction. Her only surgeries have been cesarean sections. She does not have history of major abdominal surgery. Abdominal film performed in the emergency department again demonstrating partial SBO VS ileus.  She is currently not actively vomiting, reporting last episode of nausea vomiting was overnight. Hold off on NG tube placement until seen by general surgery. Make her nothing by mouth, provide IV fluid resuscitation, supportive care. Await further recommendations from general surgery. Will try to minimize narcotic analgesics as much as possible. Having morphine 1 mg IV every 4 hours as needed only for severe  breakthrough pain.     Code Status: Full code DVT Prophylaxis: Lovenox Family Communication: Family not present Disposition Plan: Will admit to MedSurg  Time spent: 55 min  Kelvin Cellar [facility] Pager [phone]

## 2016-02-16 NOTE — ED Provider Notes (Signed)
CSN: [id number]     Arrival date & time 02/16/16  1144 History   First MD Initiated Contact with Patient 02/16/16 1505     Chief Complaint  Patient presents with  . Abdominal Pain     (Consider location/radiation/quality/duration/timing/severity/associated sxs/prior Treatment) HPI Comments: Patient here with abdominal distention and nonbilious emesis 2 without fever or chills. Seen by me recently for similar symptoms and diagnosed with ileus and did have NG tube placed.. Denies any fever or chills.  The patient denies any urinary symptoms. Nothing makes her symptoms better or worse. No treatment use prior to arrival.  Patient is a 40 y.o. female presenting with abdominal pain. The history is provided by the patient.  Abdominal Pain   Past Medical History  Diagnosis Date  . Heart murmur   . Fibroids   . Anemia    Past Surgical History  Procedure Laterality Date  . Cesarean section    . Tubal ligation    . Ankle surgery     Family History  Problem Relation Age of Onset  . Diabetes Maternal Grandmother   . Hypertension Maternal Aunt    Social History  Substance Use Topics  . Smoking status: Current Every Day Smoker -- 0.50 packs/day    Types: Cigarettes  . Smokeless tobacco: Never Used  . Alcohol Use: No   OB History    Gravida Para Term Preterm AB TAB SAB Ectopic Multiple Living   4 4  4      4      Review of Systems  Gastrointestinal: Positive for abdominal pain.  All other systems reviewed and are negative.     Allergies  Review of patient's allergies indicates no known allergies.  Home Medications   Prior to Admission medications   Medication Sig Start Date End Date Taking? Authorizing Provider  metoCLOPramide (REGLAN) 5 MG tablet Take 1 tablet (5 mg total) by mouth 3 (three) times daily before meals. 02/11/16  Yes Donne Hazel, MD  oxyCODONE-acetaminophen (PERCOCET) 10-325 MG tablet Take 1 tablet by mouth every 4 (four) hours as needed for pain.   Yes  Historical Provider, MD  pantoprazole (PROTONIX) 40 MG tablet Take 1 tablet (40 mg total) by mouth daily. 02/11/16  Yes Donne Hazel, MD  polyethylene glycol powder [facility]) powder 1 capful daily as needed for constipation Patient not taking: Reported on 01/28/2016 01/06/16   Luvenia Redden, PA-C  saccharomyces boulardii (FLORASTOR) 250 MG capsule Take 1 capsule (250 mg total) by mouth 2 (two) times daily. Patient not taking: Reported on 01/28/2016 01/12/16   Oswald Hillock, MD  traMADol (ULTRAM-ER) 100 MG 24 hr tablet Take 1 tablet (100 mg total) by mouth every 6 (six) hours as needed for pain. Patient not taking: Reported on 02/16/2016 02/05/16   Eugenie Filler, MD   BP 144/81 mmHg  Pulse 89  Temp(Src) 98.7 F (37.1 C) (Oral)  Resp 18  Ht 5\' 4"  (1.626 m)  Wt 68.04 kg  BMI 25.73 kg/m2  SpO2 100%  LMP 01/24/2016 Physical Exam  Constitutional: She is oriented to person, place, and time. She appears well-developed and well-nourished.  Non-toxic appearance. No distress.  HENT:  Head: Normocephalic and atraumatic.  Eyes: Conjunctivae, EOM and lids are normal. Pupils are equal, round, and reactive to light.  Neck: Normal range of motion. Neck supple. No tracheal deviation present. No thyroid mass present.  Cardiovascular: Normal rate, regular rhythm and normal heart sounds.  Exam reveals no gallop.   No  murmur heard. Pulmonary/Chest: Effort normal and breath sounds normal. No stridor. No respiratory distress. She has no decreased breath sounds. She has no wheezes. She has no rhonchi. She has no rales.  Abdominal: Soft. Normal appearance and bowel sounds are normal. She exhibits distension. There is no tenderness. There is no rigidity, no rebound, no guarding and no CVA tenderness.  Musculoskeletal: Normal range of motion. She exhibits no edema or tenderness.  Neurological: She is alert and oriented to person, place, and time. She has normal strength. No cranial nerve deficit or sensory  deficit. GCS eye subscore is 4. GCS verbal subscore is 5. GCS motor subscore is 6.  Skin: Skin is warm and dry. No abrasion and no rash noted.  Psychiatric: She has a normal mood and affect. Her speech is normal and behavior is normal.  Nursing note and vitals reviewed.   ED Course  Procedures (including critical care time) Labs Review Labs Reviewed  COMPREHENSIVE METABOLIC PANEL - Abnormal; Notable for the following:    Glucose, Bld 114 (*)    BUN 5 (*)    Total Protein 8.4 (*)    ALT 11 (*)    All other components within normal limits  CBC - Abnormal; Notable for the following:    Hemoglobin 11.2 (*)    HCT 34.8 (*)    RDW 16.4 (*)    All other components within normal limits  URINALYSIS, ROUTINE W REFLEX MICROSCOPIC (NOT AT [facility]) - Abnormal; Notable for the following:    pH 8.5 (*)    All other components within normal limits  LIPASE, BLOOD    Imaging Review No results found. I have personally reviewed and evaluated these images and lab results as part of my medical decision-making.   EKG Interpretation None      MDM   Final diagnoses:  None    Pt given iv fluids, pain meds, and antiemetics-- Will admit to medicine   Lacretia Leigh, MD 02/16/16 [phone]

## 2016-02-16 NOTE — Progress Notes (Signed)
Patient recently admitted and discharged from the hospital from 02/26 to 03/02 for illeus.  Patient presents to the Ed this evening with abdominal pain, n/v, abdominal distention.  Abdominal xray revealed : Low-grade/partial small bowel obstruction versus ileus.  Patient is NPO, IV fluids infusing at 125cc/hr.  Patient with Medicaid insurance pcp listed at [place].  No further EDCm needs at this time.

## 2016-02-16 NOTE — ED Notes (Signed)
Received call from pt's mother stating "I want her to be checked for poisoning.  She didn't start having all these problems until she got back with her husband.  Can you tell the doctor?"  Informed mother, would pass request along.

## 2016-02-16 NOTE — ED Notes (Signed)
Pt states she was seen for constipation, but she has a history of intestinal blockage. Pt states she was hospitalized recently for this. Pt states her last bowel movement was yesterday and then her pain returned after that

## 2016-02-16 NOTE — ED Notes (Signed)
Report given to [facility]. Pt can go up at 2345

## 2016-02-16 NOTE — ED Notes (Signed)
Pt now c/o nausea & requesting the zofran initially refused.

## 2016-02-16 NOTE — ED Notes (Signed)
X-ray informed pt is willing to sign form to have x-ray without u-preg d/t hx of tubal ligation and recent period.

## 2016-02-16 NOTE — ED Notes (Signed)
Pt denies nausea @ this time.

## 2016-02-17 DIAGNOSIS — R112 Nausea with vomiting, unspecified: Secondary | ICD-10-CM

## 2016-02-17 DIAGNOSIS — K5669 Other intestinal obstruction: Secondary | ICD-10-CM

## 2016-02-17 DIAGNOSIS — K567 Ileus, unspecified: Principal | ICD-10-CM

## 2016-02-17 DIAGNOSIS — F1190 Opioid use, unspecified, uncomplicated: Secondary | ICD-10-CM

## 2016-02-17 LAB — CBC
HCT: 27.4 % — ABNORMAL LOW (ref 36.0–46.0)
Hemoglobin: 8.8 g/dL — ABNORMAL LOW (ref 12.0–15.0)
MCH: 28.9 pg (ref 26.0–34.0)
MCHC: 32.1 g/dL (ref 30.0–36.0)
MCV: 90.1 fL (ref 78.0–100.0)
Platelets: 290 10*3/uL (ref 150–400)
RBC: 3.04 MIL/uL — ABNORMAL LOW (ref 3.87–5.11)
RDW: 16.7 % — ABNORMAL HIGH (ref 11.5–15.5)
WBC: 5.5 10*3/uL (ref 4.0–10.5)

## 2016-02-17 LAB — COMPREHENSIVE METABOLIC PANEL
ALT: 8 U/L — ABNORMAL LOW (ref 14–54)
AST: 10 U/L — ABNORMAL LOW (ref 15–41)
Albumin: 3.1 g/dL — ABNORMAL LOW (ref 3.5–5.0)
Alkaline Phosphatase: 50 U/L (ref 38–126)
Anion gap: 8 (ref 5–15)
BUN: 6 mg/dL (ref 6–20)
CO2: 25 mmol/L (ref 22–32)
Calcium: 8.7 mg/dL — ABNORMAL LOW (ref 8.9–10.3)
Chloride: 107 mmol/L (ref 101–111)
Creatinine, Ser: 0.68 mg/dL (ref 0.44–1.00)
GFR calc Af Amer: >60 mL/min (ref >60)
GFR calc non Af Amer: >60 mL/min (ref >60)
Glucose, Bld: 94 mg/dL (ref 65–99)
Potassium: 3.7 mmol/L (ref 3.5–5.1)
Sodium: 140 mmol/L (ref 135–145)
Total Bilirubin: 0.9 mg/dL (ref 0.3–1.2)
Total Protein: 6 g/dL — ABNORMAL LOW (ref 6.5–8.1)

## 2016-02-17 MED ORDER — HYDROMORPHONE HCL 1 MG/ML IJ SOLN
1.0000 mg | Freq: Once | INTRAMUSCULAR | Status: AC
  Administered 2016-02-17: 1 mg via INTRAVENOUS

## 2016-02-17 MED ORDER — HYDROMORPHONE HCL 1 MG/ML IJ SOLN
1.0000 mg | Freq: Once | INTRAMUSCULAR | Status: AC
  Administered 2016-02-17: 1 mg via INTRAVENOUS
  Filled 2016-02-17: qty 1

## 2016-02-17 MED ORDER — ENOXAPARIN SODIUM 40 MG/0.4ML ~~LOC~~ SOLN
40.0000 mg | Freq: Every day | SUBCUTANEOUS | Status: DC
  Filled 2016-02-17 (×4): qty 0.4

## 2016-02-17 MED ORDER — HYDROMORPHONE HCL 1 MG/ML IJ SOLN
1.0000 mg | INTRAMUSCULAR | Status: AC | PRN
  Administered 2016-02-18 (×3): 1 mg via INTRAVENOUS
  Filled 2016-02-17 (×3): qty 1

## 2016-02-17 MED ORDER — PEG 3350-KCL-NA BICARB-NACL 420 G PO SOLR
2000.0000 mL | Freq: Once | ORAL | Status: AC
  Administered 2016-02-17: 2000 mL via ORAL

## 2016-02-17 MED ORDER — MORPHINE SULFATE (PF) 2 MG/ML IV SOLN
1.0000 mg | INTRAVENOUS | Status: DC | PRN
  Administered 2016-02-17: 1 mg via INTRAVENOUS
  Filled 2016-02-17: qty 1

## 2016-02-17 MED ORDER — ONDANSETRON HCL 4 MG PO TABS: 4.0000 mg | ORAL_TABLET | Freq: Four times a day (QID) | ORAL | Status: DC | PRN

## 2016-02-17 MED ORDER — ACETAMINOPHEN 325 MG PO TABS
650.0000 mg | ORAL_TABLET | Freq: Four times a day (QID) | ORAL | Status: DC | PRN
  Administered 2016-02-19: 650 mg via ORAL
  Filled 2016-02-17: qty 2

## 2016-02-17 MED ORDER — ACETAMINOPHEN 650 MG RE SUPP: 650.0000 mg | Freq: Four times a day (QID) | RECTAL | Status: DC | PRN

## 2016-02-17 MED ORDER — HYDROMORPHONE HCL 1 MG/ML IJ SOLN
1.0000 mg | INTRAMUSCULAR | Status: DC | PRN
  Administered 2016-02-17 (×3): 1 mg via INTRAVENOUS
  Filled 2016-02-17 (×4): qty 1

## 2016-02-17 MED ORDER — HYDROMORPHONE HCL 1 MG/ML IJ SOLN: 1.0000 mg | INTRAMUSCULAR | Status: DC | PRN

## 2016-02-17 MED ORDER — ONDANSETRON HCL 4 MG/2ML IJ SOLN
4.0000 mg | Freq: Four times a day (QID) | INTRAMUSCULAR | Status: DC | PRN
  Administered 2016-02-17 – 2016-02-19 (×7): 4 mg via INTRAVENOUS
  Filled 2016-02-17 (×7): qty 2

## 2016-02-17 MED ORDER — MORPHINE SULFATE (PF) 2 MG/ML IV SOLN
2.0000 mg | INTRAVENOUS | Status: DC | PRN
  Administered 2016-02-17 (×2): 2 mg via INTRAVENOUS
  Filled 2016-02-17 (×2): qty 1

## 2016-02-17 MED ORDER — SODIUM CHLORIDE 0.9 % IV SOLN
INTRAVENOUS | Status: DC
  Administered 2016-02-17 – 2016-02-19 (×6): via INTRAVENOUS

## 2016-02-17 NOTE — Progress Notes (Signed)
[facility] PROGRESS NOTE  [name] [id number] DOB: 1976-04-21 DOA: 02/16/2016 [facility]CP: [facility]  Assessment/Plan: 1. Ileus/Chronic constipation -4th admission in 2 months for same, always deemed to be due to narcotic use, pt reports that she wasn't prescribed any narcotics at discharge on 3/2 and didn't take any for 5days at home but back with same again -I wonder if she needs a colonoscopy, [facility] consulted last pm -i will cut down narcotics  -no vomiting so will hold off on NGT -trial of clears if ok with [facility], will start laxatives, nulytely pending surgical eval  2.  Chronic back pain/menorrhagia -taking narcotics for about 20years -reports not taking any since last discharge  DVT proph: lovenox  Code Status: Full Code Family Communication: none at bedside Disposition Plan: Home pending improvement   Consultants:  [facility]  HPI/Subjective: Complains of 10/10 abd pain  Objective: Filed Vitals:   02/17/16 0002 02/17/16 0536  BP: 111/57 119/54  Pulse: 63 61  Temp: 98.8 F (37.1 C) 98.5 F (36.9 C)  Resp:  16    Intake/Output Summary (Last 24 hours) at 02/17/16 0954 Last data filed at 02/17/16 0600  Gross per 24 hour  Intake    750 ml  Output      0 ml  Net    750 ml   Filed Weights   02/16/16 1213 02/17/16 0002  Weight: 68.04 kg (150 lb) 67.405 kg (148 lb 9.6 oz)    Exam:   General:  AAOx3  Cardiovascular: S1S2/RRR  Respiratory: CTAB  Abdomen: soft, distended, mild diffuse tenderness, BS present  Musculoskeletal: no ede,a    Data Reviewed: Basic Metabolic Panel:  Recent Labs Lab 02/16/16 1236 02/17/16 0421  NA 138 140  K 4.0 3.7  CL 103 107  CO2 24 25  GLUCOSE 114* 94  BUN 5* 6  CREATININE 0.52 0.68  CALCIUM 10.0 8.7*   Liver Function Tests:  Recent Labs Lab 02/16/16 1236 02/17/16 0421  AST 16 10*  ALT 11* 8*  ALKPHOS 69 50  BILITOT 0.7 0.9  PROT 8.4* 6.0*  ALBUMIN 4.5 3.1*    Recent Labs Lab 02/16/16 1236   LIPASE 17   No results for input(s): AMMONIA in the last 168 hours. CBC:  Recent Labs Lab 02/16/16 1236 02/17/16 0421  WBC 8.7 5.5  HGB 11.2* 8.8*  HCT 34.8* 27.4*  MCV 89.0 90.1  PLT 334 290   Cardiac Enzymes: No results for input(s): CKTOTAL, CKMB, CKMBINDEX, TROPONINI in the last 168 hours. BNP (last 3 results) No results for input(s): BNP in the last 8760 hours.  ProBNP (last 3 results) No results for input(s): PROBNP in the last 8760 hours.  CBG: No results for input(s): GLUCAP in the last 168 hours.  Recent Results (from the past 240 hour(s))  C difficile quick scan w PCR reflex     Status: None   Collection Time: 02/09/16  5:54 PM  Result Value Ref Range Status   C Diff antigen NEGATIVE NEGATIVE Final   C Diff toxin NEGATIVE NEGATIVE Final   C Diff interpretation Negative for toxigenic C. difficile  Final     Studies: Dg Abd Acute W/chest  02/16/2016  CLINICAL DATA:  Abdominal pain and constipation. Previous history of bowel obstruction. EXAM: DG ABDOMEN ACUTE W/ 1V CHEST COMPARISON:  02/09/2016 FINDINGS: A few mildly dilated small bowel loops containing air-fluid levels are seen within the left lower quadrant. There is also gas and stool throughout the colon. No evidence  of free air. Left pelvic phlebolith noted. Heart size and mediastinal contours are normal. Both lungs are clear. Mild thoracolumbar dextroscoliosis noted. IMPRESSION: Low-grade/partial small bowel obstruction versus ileus. Continued radiographic followup recommended. No active cardiopulmonary disease. Electronically Signed   By: Earle Gell M.D.   On: 02/16/2016 16:37    Scheduled Meds: . enoxaparin (LOVENOX) injection  40 mg Subcutaneous QHS   Continuous Infusions: . sodium chloride 125 mL/hr at 02/17/16 [id number]   Antibiotics Given (last 72 hours)    None      Principal Problem:   SBO (small bowel obstruction) (HCC) Active Problems:   Nausea with vomiting   Ileus ([place])   Chronic narcotic  use    Time spent: 1min    Ulus Hazen  [facility] Pager [phone]. If 7PM-7AM, please contact night-coverage at [email or web address], password [facility] 02/17/2016, 9:54 AM  LOS: 1 day

## 2016-02-17 NOTE — Consult Note (Signed)
Chief Complaint:  Recurrent "sbo"  History of Present Illness:  [name] is an 40 y.o. female who was seen by me in the ER last night and again this morning.  She is admitted for a small bowel obstruction and this has become a recurrent pattern.    He xray last night shows air in the descending colon and she is passing flatus and having some cramping abdominal pain.  She appears more of ileus possibly related to her chronic narcotic use.    Perhaps the best approach would be to clean her out with Miralax and obtain an old fashioned UGI with small bowel follow through since she has had so many CT scans.  This would see if in the clean bowel she could have a kink of bowel in her pelvis.  That would be operable.    Past Medical History  Diagnosis Date  . Heart murmur   . Fibroids   . Anemia     Past Surgical History  Procedure Laterality Date  . Cesarean section    . Tubal ligation    . Ankle surgery      Current Facility-Administered Medications  Medication Dose Route Frequency Provider Last Rate Last Dose  . 0.9 %  sodium chloride infusion   Intravenous Continuous Kelvin Cellar, MD 125 mL/hr at 02/17/16 [phone]    . acetaminophen (TYLENOL) tablet 650 mg  650 mg Oral Q6H PRN Kelvin Cellar, MD       Or  . acetaminophen (TYLENOL) suppository 650 mg  650 mg Rectal Q6H PRN Kelvin Cellar, MD      . enoxaparin (LOVENOX) injection 40 mg  40 mg Subcutaneous QHS Kelvin Cellar, MD   40 mg at 02/17/16 0138  . morphine 2 MG/ML injection 1 mg  1 mg Intravenous Q4H PRN Domenic Polite, MD      . ondansetron [facility]) tablet 4 mg  4 mg Oral Q6H PRN Kelvin Cellar, MD       Or  . ondansetron (ZOFRAN) injection 4 mg  4 mg Intravenous Q6H PRN Kelvin Cellar, MD   4 mg at 02/17/16 4944   Review of patient's allergies indicates no known allergies. Family History  Problem Relation Age of Onset  . Diabetes Maternal Grandmother   . Hypertension Maternal Aunt    Social History:   reports  that she has been smoking Cigarettes.  She has been smoking about 0.50 packs per day. She has never used smokeless tobacco. She reports that she does not drink alcohol or use illicit drugs.   REVIEW OF SYSTEMS : Negative except for see problem list  Physical Exam:   Blood pressure 119/54, pulse 61, temperature 98.5 F (36.9 C), temperature source Oral, resp. rate 16, height 5' 4"  (1.626 m), weight 67.405 kg (148 lb 9.6 oz), last menstrual period 01/24/2016, SpO2 99 %. Body mass index is 25.49 kg/(m^2).  Gen:  WDWN AAF NAD  Neurological: Alert and oriented to person, place, and time. Motor and sensory function is grossly intact  Head: Normocephalic and atraumatic.  Eyes: Conjunctivae are normal. Pupils are equal, round, and reactive to light. No scleral icterus.  Neck: Normal range of motion. Neck supple. No tracheal deviation or thyromegaly present.  Cardiovascular:  SR without murmurs or gallops.  No carotid bruits Breast:  Not examined Respiratory: Effort normal.  No respiratory distress. No chest wall tenderness. Breath sounds normal.  No wheezes, rales or rhonchi.  Abdomen:  Flat; cramping but not acute abdomen GU:  Not  examined Musculoskeletal: Normal range of motion. Extremities are nontender. No cyanosis, edema or clubbing noted Lymphadenopathy: No cervical, preauricular, postauricular or axillary adenopathy is present Skin: Skin is warm and dry. No rash noted. No diaphoresis. No erythema. No pallor. Pscyh: Normal mood and affect. Behavior is normal. Judgment and thought content normal.   LABORATORY RESULTS: Results for orders placed or performed during the hospital encounter of 02/16/16 (from the past 48 hour(s))  Lipase, blood     Status: None   Collection Time: 02/16/16 12:36 PM  Result Value Ref Range   Lipase 17 11 - 51 U/L  Comprehensive metabolic panel     Status: Abnormal   Collection Time: 02/16/16 12:36 PM  Result Value Ref Range   Sodium 138 135 - 145 mmol/L    Potassium 4.0 3.5 - 5.1 mmol/L   Chloride 103 101 - 111 mmol/L   CO2 24 22 - 32 mmol/L   Glucose, Bld 114 (H) 65 - 99 mg/dL   BUN 5 (L) 6 - 20 mg/dL   Creatinine, Ser 0.52 0.44 - 1.00 mg/dL   Calcium 10.0 8.9 - 10.3 mg/dL   Total Protein 8.4 (H) 6.5 - 8.1 g/dL   Albumin 4.5 3.5 - 5.0 g/dL   AST 16 15 - 41 U/L   ALT 11 (L) 14 - 54 U/L   Alkaline Phosphatase 69 38 - 126 U/L   Total Bilirubin 0.7 0.3 - 1.2 mg/dL   GFR calc non Af Amer >60 >60 mL/min   GFR calc Af Amer >60 >60 mL/min    Comment: (NOTE) The eGFR has been calculated using the CKD EPI equation. This calculation has not been validated in all clinical situations. eGFR's persistently <60 mL/min signify possible Chronic Kidney Disease.    Anion gap 11 5 - 15  CBC     Status: Abnormal   Collection Time: 02/16/16 12:36 PM  Result Value Ref Range   WBC 8.7 4.0 - 10.5 K/uL   RBC 3.91 3.87 - 5.11 MIL/uL   Hemoglobin 11.2 (L) 12.0 - 15.0 g/dL   HCT 34.8 (L) 36.0 - 46.0 %   MCV 89.0 78.0 - 100.0 fL   MCH 28.6 26.0 - 34.0 pg   MCHC 32.2 30.0 - 36.0 g/dL   RDW 16.4 (H) 11.5 - 15.5 %   Platelets 334 150 - 400 K/uL  Urinalysis, Routine w reflex microscopic (not at [facility])     Status: Abnormal   Collection Time: 02/16/16 12:42 PM  Result Value Ref Range   Color, Urine YELLOW YELLOW   APPearance CLEAR CLEAR   Specific Gravity, Urine 1.019 1.005 - 1.030   pH 8.5 (H) 5.0 - 8.0   Glucose, UA NEGATIVE NEGATIVE mg/dL   Hgb urine dipstick NEGATIVE NEGATIVE   Bilirubin Urine NEGATIVE NEGATIVE   Ketones, ur NEGATIVE NEGATIVE mg/dL   Protein, ur NEGATIVE NEGATIVE mg/dL   Nitrite NEGATIVE NEGATIVE   Leukocytes, UA NEGATIVE NEGATIVE    Comment: MICROSCOPIC NOT DONE ON URINES WITH NEGATIVE PROTEIN, BLOOD, LEUKOCYTES, NITRITE, OR GLUCOSE <1000 mg/dL.  Comprehensive metabolic panel     Status: Abnormal   Collection Time: 02/17/16  4:21 AM  Result Value Ref Range   Sodium 140 135 - 145 mmol/L   Potassium 3.7 3.5 - 5.1 mmol/L   Chloride  107 101 - 111 mmol/L   CO2 25 22 - 32 mmol/L   Glucose, Bld 94 65 - 99 mg/dL   BUN 6 6 - 20 mg/dL   Creatinine, Ser  0.68 0.44 - 1.00 mg/dL   Calcium 8.7 (L) 8.9 - 10.3 mg/dL   Total Protein 6.0 (L) 6.5 - 8.1 g/dL   Albumin 3.1 (L) 3.5 - 5.0 g/dL   AST 10 (L) 15 - 41 U/L   ALT 8 (L) 14 - 54 U/L   Alkaline Phosphatase 50 38 - 126 U/L   Total Bilirubin 0.9 0.3 - 1.2 mg/dL   GFR calc non Af Amer >60 >60 mL/min   GFR calc Af Amer >60 >60 mL/min    Comment: (NOTE) The eGFR has been calculated using the CKD EPI equation. This calculation has not been validated in all clinical situations. eGFR's persistently <60 mL/min signify possible Chronic Kidney Disease.    Anion gap 8 5 - 15  CBC     Status: Abnormal   Collection Time: 02/17/16  4:21 AM  Result Value Ref Range   WBC 5.5 4.0 - 10.5 K/uL   RBC 3.04 (L) 3.87 - 5.11 MIL/uL   Hemoglobin 8.8 (L) 12.0 - 15.0 g/dL    Comment: RESULT REPEATED AND VERIFIED DELTA CHECK NOTED    HCT 27.4 (L) 36.0 - 46.0 %   MCV 90.1 78.0 - 100.0 fL   MCH 28.9 26.0 - 34.0 pg   MCHC 32.1 30.0 - 36.0 g/dL   RDW 16.7 (H) 11.5 - 15.5 %   Platelets 290 150 - 400 K/uL     RADIOLOGY RESULTS: Dg Abd Acute W/chest  02/16/2016  CLINICAL DATA:  Abdominal pain and constipation. Previous history of bowel obstruction. EXAM: DG ABDOMEN ACUTE W/ 1V CHEST COMPARISON:  02/09/2016 FINDINGS: A few mildly dilated small bowel loops containing air-fluid levels are seen within the left lower quadrant. There is also gas and stool throughout the colon. No evidence of free air. Left pelvic phlebolith noted. Heart size and mediastinal contours are normal. Both lungs are clear. Mild thoracolumbar dextroscoliosis noted. IMPRESSION: Low-grade/partial small bowel obstruction versus ileus. Continued radiographic followup recommended. No active cardiopulmonary disease. Electronically Signed   By: Earle Gell M.D.   On: 02/16/2016 16:37    Problem List: Patient Active Problem List    Diagnosis Date Noted  . Ileus ([place]) 02/08/2016  . Chronic narcotic use 02/08/2016  . Anemia, iron deficiency   . SBO (small bowel obstruction) ([place]) 01/29/2016  . Small bowel obstruction ([place]) 01/29/2016  . Hypokalemia 01/29/2016  . Iron deficiency anemia due to chronic blood loss 01/29/2016  . Anemia 01/29/2016  . Abdominal pain 01/09/2016  . Generalized abdominal pain 01/09/2016  . Nausea with vomiting 01/09/2016  . Tobacco abuse 01/09/2016  . Abdominal pain in female   . Constipation     Assessment & Plan: Recurrent partial sbo vs ileus related to narcotic use.  Recommend clean out with miralax and small bowel follow through.      Matt B. Hassell Done, MD, [facility] [phone] beeper [phone]  02/17/2016 11:04 AM

## 2016-02-17 NOTE — Progress Notes (Signed)
  Subjective: Pt hospitalized on 2/26-02/11/16 with same issue.  We did the SB protocol and the contrast went straight thru.   "9 hours status post administration of oral contrast, contrast has progressed to the colon. There is no evidence for bowel obstruction. The finding on the prior radiograph again likely reflects mild bowel dysmotility."  She was referred to GI on discharge. She says she had her last BM on Monday 02/15/16.  She has not followed up with GI.   Objective: Vital signs in last 24 hours: Temp:  [98.5 F (36.9 C)-99.4 F (37.4 C)] 98.5 F (36.9 C) (03/08 0536) Pulse Rate:  [61-89] 61 (03/08 0536) Resp:  [16-18] 16 (03/08 0536) BP: (107-144)/(54-85) 119/54 mmHg (03/08 0536) SpO2:  [99 %-100 %] 99 % (03/08 0536) Weight:  [67.405 kg (148 lb 9.6 oz)-68.04 kg (150 lb)] 67.405 kg (148 lb 9.6 oz) (03/08 0002) Last BM Date: 02/16/16 750 IV Voided x 1 Afebrile, VSS Labs OK Intake/Output from previous day: 03/07 0701 - 03/08 0700 In: 750 [I.V.:750] Out: -  Intake/Output this shift: Total I/O In: -  Out: 300 [Urine:300]  General appearance: alert, cooperative and no distress GI: soft, non-tender; bowel sounds normal; no masses,  no organomegaly  Lab Results:   Recent Labs  02/16/16 1236 02/17/16 0421  WBC 8.7 5.5  HGB 11.2* 8.8*  HCT 34.8* 27.4*  PLT 334 290    BMET  Recent Labs  02/16/16 1236 02/17/16 0421  NA 138 140  K 4.0 3.7  CL 103 107  CO2 24 25  GLUCOSE 114* 94  BUN 5* 6  CREATININE 0.52 0.68  CALCIUM 10.0 8.7*   PT/INR No results for input(s): LABPROT, INR in the last 72 hours.   Recent Labs Lab 02/16/16 1236 02/17/16 0421  AST 16 10*  ALT 11* 8*  ALKPHOS 69 50  BILITOT 0.7 0.9  PROT 8.4* 6.0*  ALBUMIN 4.5 3.1*     Lipase     Component Value Date/Time   LIPASE 17 02/16/2016 1236     Studies/Results: Dg Abd Acute W/chest  02/16/2016  CLINICAL DATA:  Abdominal pain and constipation. Previous history of bowel obstruction.  EXAM: DG ABDOMEN ACUTE W/ 1V CHEST COMPARISON:  02/09/2016 FINDINGS: A few mildly dilated small bowel loops containing air-fluid levels are seen within the left lower quadrant. There is also gas and stool throughout the colon. No evidence of free air. Left pelvic phlebolith noted. Heart size and mediastinal contours are normal. Both lungs are clear. Mild thoracolumbar dextroscoliosis noted. IMPRESSION: Low-grade/partial small bowel obstruction versus ileus. Continued radiographic followup recommended. No active cardiopulmonary disease. Electronically Signed   By: Earle Gell M.D.   On: 02/16/2016 16:37    Medications: . enoxaparin (LOVENOX) injection  40 mg Subcutaneous QHS  . polyethylene glycol-electrolytes  2,000 mL Oral Once    Assessment/Plan Partial SBO vs functional obstruction Possible colitis Chronic pain and narcotic use Fibroids Hx of C section/tubal ligation. Tobacco use Antibiotics: None DVT: SCD/Lovenox  Plan:  We have not found her to be obstructed and believe this is functional.  Golytely is ordered we will follow as needed.      LOS: 1 day    Aveleen Nevers 02/17/2016

## 2016-02-17 NOTE — Progress Notes (Signed)
Megan Blinks, NP notified that pt states that ordered Morphine does not decrease pain level. Last dose of Morphine 1mg  given prior to transfer on  the floor. Morphine dose increased to 2mg  every 3 hours prn.

## 2016-02-18 ENCOUNTER — Inpatient Hospital Stay (HOSPITAL_COMMUNITY): Payer: Medicaid Other

## 2016-02-18 LAB — CBC
HCT: 25.5 % — ABNORMAL LOW (ref 36.0–46.0)
Hemoglobin: 8 g/dL — ABNORMAL LOW (ref 12.0–15.0)
MCH: 28.7 pg (ref 26.0–34.0)
MCHC: 31.4 g/dL (ref 30.0–36.0)
MCV: 91.4 fL (ref 78.0–100.0)
Platelets: 232 10*3/uL (ref 150–400)
RBC: 2.79 MIL/uL — ABNORMAL LOW (ref 3.87–5.11)
RDW: 16.4 % — ABNORMAL HIGH (ref 11.5–15.5)
WBC: 5 10*3/uL (ref 4.0–10.5)

## 2016-02-18 LAB — BASIC METABOLIC PANEL
Anion gap: 7 (ref 5–15)
BUN: 7 mg/dL (ref 6–20)
CO2: 25 mmol/L (ref 22–32)
Calcium: 8.5 mg/dL — ABNORMAL LOW (ref 8.9–10.3)
Chloride: 109 mmol/L (ref 101–111)
Creatinine, Ser: 0.68 mg/dL (ref 0.44–1.00)
GFR calc Af Amer: >60 mL/min (ref >60)
GFR calc non Af Amer: >60 mL/min (ref >60)
Glucose, Bld: 84 mg/dL (ref 65–99)
Potassium: 3.8 mmol/L (ref 3.5–5.1)
Sodium: 141 mmol/L (ref 135–145)

## 2016-02-18 MED ORDER — DIATRIZOATE MEGLUMINE & SODIUM 66-10 % PO SOLN
90.0000 mL | Freq: Once | ORAL | Status: AC
  Administered 2016-02-18: 90 mL via NASOGASTRIC
  Filled 2016-02-18: qty 90

## 2016-02-18 MED ORDER — HYDROMORPHONE HCL 1 MG/ML IJ SOLN
1.0000 mg | INTRAMUSCULAR | Status: DC | PRN
  Administered 2016-02-18 – 2016-02-19 (×8): 1 mg via INTRAVENOUS
  Filled 2016-02-18 (×8): qty 1

## 2016-02-18 NOTE — Progress Notes (Signed)
[facility] PROGRESS NOTE  GARNET SPAKES [id number] DOB: 1976/11/18 DOA: 02/16/2016 PCP: [facility]  Assessment/Plan: 1. Ileus/Chronic constipation -4th admission in 2 months for same, always deemed to be due to narcotic use, pt reports that she wasn't prescribed any narcotics at discharge on 3/2 and didn't take any for 5days at home but back with same again -[facility] consulting, Cut down IV narcotics -no vomiting so will held off on NGT -ordered nulytely drank very little, encouraged to drink this -FU KuB, no BM yet -CCS recommends small bowel follow through  2.  Chronic back pain/menorrhagia -taking narcotics for about 20years -reports not taking any since last discharge  DVT proph: lovenox  Code Status: Full Code Family Communication: none at bedside Disposition Plan: Home pending improvement   Consultants:  [facility]  HPI/Subjective: Reports feeling a little better, drank 1/3 of nulytely, no Bm eyt  Objective: Filed Vitals:   02/17/16 2115 02/18/16 0547  BP: 112/59 131/76  Pulse: 68 74  Temp: 98.6 F (37 C) 98.4 F (36.9 C)  Resp: 18 18    Intake/Output Summary (Last 24 hours) at 02/18/16 1303 Last data filed at 02/18/16 0200  Gross per 24 hour  Intake   1500 ml  Output      0 ml  Net   1500 ml   Filed Weights   02/16/16 1213 02/17/16 0002  Weight: 68.04 kg (150 lb) 67.405 kg (148 lb 9.6 oz)    Exam:   General:  AAOx3  Cardiovascular: S1S2/RRR  Respiratory: CTAB  Abdomen: soft, distended, mild diffuse tenderness, BS present  Musculoskeletal: no ede,a    Data Reviewed: Basic Metabolic Panel:  Recent Labs Lab 02/16/16 1236 02/17/16 0421 02/18/16 0412  NA 138 140 141  K 4.0 3.7 3.8  CL 103 107 109  CO2 24 25 25   GLUCOSE 114* 94 84  BUN 5* 6 7  CREATININE 0.52 0.68 0.68  CALCIUM 10.0 8.7* 8.5*   Liver Function Tests:  Recent Labs Lab 02/16/16 1236 02/17/16 0421  AST 16 10*  ALT 11* 8*  ALKPHOS 69 50  BILITOT 0.7 0.9   PROT 8.4* 6.0*  ALBUMIN 4.5 3.1*    Recent Labs Lab 02/16/16 1236  LIPASE 17   No results for input(s): AMMONIA in the last 168 hours. CBC:  Recent Labs Lab 02/16/16 1236 02/17/16 0421 02/18/16 0412  WBC 8.7 5.5 5.0  HGB 11.2* 8.8* 8.0*  HCT 34.8* 27.4* 25.5*  MCV 89.0 90.1 91.4  PLT 334 290 232   Cardiac Enzymes: No results for input(s): CKTOTAL, CKMB, CKMBINDEX, TROPONINI in the last 168 hours. BNP (last 3 results) No results for input(s): BNP in the last 8760 hours.  ProBNP (last 3 results) No results for input(s): PROBNP in the last 8760 hours.  CBG: No results for input(s): GLUCAP in the last 168 hours.  Recent Results (from the past 240 hour(s))  C difficile quick scan w PCR reflex     Status: None   Collection Time: 02/09/16  5:54 PM  Result Value Ref Range Status   C Diff antigen NEGATIVE NEGATIVE Final   C Diff toxin NEGATIVE NEGATIVE Final   C Diff interpretation Negative for toxigenic C. difficile  Final     Studies: Dg Abd 1 View  02/18/2016  CLINICAL DATA:  Nausea, vomiting and abdominal cramping. EXAM: ABDOMEN - 1 VIEW COMPARISON:  02/16/2016 FINDINGS: No significant bowel dilation is seen. Normal bowel gas pattern. No significant increase in stool. Soft tissues  and skeletal structures are unremarkable. IMPRESSION: 1. Normal supine abdomen radiograph. The small bowel obstruction suggests on the prior study is no longer evident. Electronically Signed   By: Lajean Manes M.D.   On: 02/18/2016 10:04   Dg Abd Acute W/chest  02/16/2016  CLINICAL DATA:  Abdominal pain and constipation. Previous history of bowel obstruction. EXAM: DG ABDOMEN ACUTE W/ 1V CHEST COMPARISON:  02/09/2016 FINDINGS: A few mildly dilated small bowel loops containing air-fluid levels are seen within the left lower quadrant. There is also gas and stool throughout the colon. No evidence of free air. Left pelvic phlebolith noted. Heart size and mediastinal contours are normal. Both lungs  are clear. Mild thoracolumbar dextroscoliosis noted. IMPRESSION: Low-grade/partial small bowel obstruction versus ileus. Continued radiographic followup recommended. No active cardiopulmonary disease. Electronically Signed   By: Earle Gell M.D.   On: 02/16/2016 16:37    Scheduled Meds: . enoxaparin (LOVENOX) injection  40 mg Subcutaneous QHS   Continuous Infusions: . sodium chloride 100 mL/hr at 02/18/16 0842   Antibiotics Given (last 72 hours)    None      Principal Problem:   SBO (small bowel obstruction) ([facility]) Active Problems:   Nausea with vomiting   Ileus ([place])   Chronic narcotic use    Time spent: 56min    Taelon Bendorf  [facility] Pager [phone]. If 7PM-7AM, please contact night-coverage at [email or web address], password [facility] 02/18/2016, 1:03 PM  LOS: 2 days

## 2016-02-18 NOTE — Progress Notes (Signed)
Subjective: Morphine 5 mg, 5 mg of dilaudid yesterday Dilaudid 4 mg so far today   Golytely  Action Reason Dose/Rate Route Site Linked Line Action Time Recorded Time      Given N/A 2,000 mL Oral   02/17/16 1253 02/17/16 1253       Administering User: Rebbeca Paul, RN    She has only taken about half of the Golytely, and no BM so far.  Abdomen is distended some.  She is asking about a diet.    Objective: Vital signs in last 24 hours: Temp:  [98.4 F (36.9 C)-98.7 F (37.1 C)] 98.4 F (36.9 C) (03/09 0547) Pulse Rate:  [61-74] 74 (03/09 0547) Resp:  [18] 18 (03/09 0547) BP: (112-131)/(59-76) 131/76 mmHg (03/09 0547) SpO2:  [97 %-99 %] 97 % (03/09 0547) Last BM Date: 02/16/16 Urine 300 recorded No Bm recorded Nothing PO recorded Afebrile, VSS Labs stable, she is anemic  Film this AM:  No significant bowel dilation is seen. Normal bowel gas pattern. No significant increase in stool.  Soft tissues and skeletal structures are unremarkable   Intake/Output from previous day: 03/08 0701 - 03/09 0700 In: 2000 [I.V.:2000] Out: 300 [Urine:300] Intake/Output this shift:    General appearance: alert, cooperative and no distress GI: soft, distended, few BS, not tender.  Lab Results:   Recent Labs  02/17/16 0421 02/18/16 0412  WBC 5.5 5.0  HGB 8.8* 8.0*  HCT 27.4* 25.5*  PLT 290 232    BMET  Recent Labs  02/17/16 0421 02/18/16 0412  NA 140 141  K 3.7 3.8  CL 107 109  CO2 25 25  GLUCOSE 94 84  BUN 6 7  CREATININE 0.68 0.68  CALCIUM 8.7* 8.5*   PT/INR No results for input(s): LABPROT, INR in the last 72 hours.   Recent Labs Lab 02/16/16 1236 02/17/16 0421  AST 16 10*  ALT 11* 8*  ALKPHOS 69 50  BILITOT 0.7 0.9  PROT 8.4* 6.0*  ALBUMIN 4.5 3.1*     Lipase     Component Value Date/Time   LIPASE 17 02/16/2016 1236     Studies/Results: Dg Abd 1 View  02/18/2016  CLINICAL DATA:  Nausea, vomiting and abdominal cramping. EXAM: ABDOMEN - 1  VIEW COMPARISON:  02/16/2016 FINDINGS: No significant bowel dilation is seen. Normal bowel gas pattern. No significant increase in stool. Soft tissues and skeletal structures are unremarkable. IMPRESSION: 1. Normal supine abdomen radiograph. The small bowel obstruction suggests on the prior study is no longer evident. Electronically Signed   By: Lajean Manes M.D.   On: 02/18/2016 10:04   Dg Abd Acute W/chest  02/16/2016  CLINICAL DATA:  Abdominal pain and constipation. Previous history of bowel obstruction. EXAM: DG ABDOMEN ACUTE W/ 1V CHEST COMPARISON:  02/09/2016 FINDINGS: A few mildly dilated small bowel loops containing air-fluid levels are seen within the left lower quadrant. There is also gas and stool throughout the colon. No evidence of free air. Left pelvic phlebolith noted. Heart size and mediastinal contours are normal. Both lungs are clear. Mild thoracolumbar dextroscoliosis noted. IMPRESSION: Low-grade/partial small bowel obstruction versus ileus. Continued radiographic followup recommended. No active cardiopulmonary disease. Electronically Signed   By: Earle Gell M.D.   On: 02/16/2016 16:37    Medications: . enoxaparin (LOVENOX) injection  40 mg Subcutaneous QHS    Assessment/Plan Partial SBO vs functional obstruction Possible colitis Chronic pain and narcotic use Fibroids Hx of C section/tubal ligation. Tobacco use Antibiotics: None DVT: SCD/Lovenox  Plan:  I think she should be encouraged to complete the Golytely. Her film is unremarkable.  She had the small bowel protocol on 02/09/16.   After 9 hours:  Contrast has progressed to the colon. No evidence for bowel obstruction. Distended loops of small bowel again noted, likely reflecting mild bowel dysmotility.  If she does not have a BM by this afternoon we will order SB protocol again without the NG.    LOS: 2 days    Blakelynn Scheeler 02/18/2016

## 2016-02-19 ENCOUNTER — Encounter: Payer: Self-pay | Admitting: Gastroenterology

## 2016-02-19 ENCOUNTER — Inpatient Hospital Stay (HOSPITAL_COMMUNITY): Payer: Medicaid Other

## 2016-02-19 DIAGNOSIS — F1120 Opioid dependence, uncomplicated: Secondary | ICD-10-CM | POA: Diagnosis present

## 2016-02-19 DIAGNOSIS — K5909 Other constipation: Secondary | ICD-10-CM | POA: Diagnosis present

## 2016-02-19 MED ORDER — SENNOSIDES-DOCUSATE SODIUM 8.6-50 MG PO TABS: 1.0000 | ORAL_TABLET | Freq: Two times a day (BID) | ORAL | Status: DC

## 2016-02-19 MED ORDER — SENNOSIDES-DOCUSATE SODIUM 8.6-50 MG PO TABS
1.0000 | ORAL_TABLET | Freq: Two times a day (BID) | ORAL | Status: DC
  Administered 2016-02-19: 1 via ORAL
  Filled 2016-02-19: qty 1

## 2016-02-19 MED ORDER — POLYETHYLENE GLYCOL 3350 17 G PO PACK: 17.0000 g | PACK | Freq: Two times a day (BID) | ORAL | Status: DC

## 2016-02-19 MED ORDER — POLYETHYLENE GLYCOL 3350 17 G PO PACK
17.0000 g | PACK | Freq: Two times a day (BID) | ORAL | Status: DC
  Administered 2016-02-19: 17 g via ORAL
  Filled 2016-02-19: qty 1

## 2016-02-19 NOTE — Progress Notes (Signed)
Subjective: BM last PM and this AM.  Feels better.    Objective: Vital signs in last 24 hours: Temp:  [98.2 F (36.8 C)-99.3 F (37.4 C)] 98.2 F (36.8 C) (03/10 0522) Pulse Rate:  [65-67] 65 (03/10 0522) Resp:  [18] 18 (03/10 0522) BP: (121-129)/(66-80) 129/80 mmHg (03/10 0522) SpO2:  [99 %-100 %] 100 % (03/10 0522) Last BM Date: 02/16/16 No BM recorded Urine 600 recorded Afebrile, VSS No labs today SB protocol last PM:  Oral contrast material was apparently given a 8 hours ago. Contrast material is demonstrated throughout the colon and in the rectum as well as in the few distal small bowel loops. This suggest no evidence of complete obstruction. Visualized small and large bowel are not abnormally distended. 8 mg dilaudid recorded yesterday.  3 so far today   Intake/Output from previous day: 03/09 0701 - 03/10 0700 In: 2167.5 [I.V.:2167.5] Out: 600 [Urine:600] Intake/Output this shift:    General appearance: alert, cooperative and no distress GI: soft, non-tender; bowel sounds normal; no masses,  no organomegaly  Lab Results:   Recent Labs  02/17/16 0421 02/18/16 0412  WBC 5.5 5.0  HGB 8.8* 8.0*  HCT 27.4* 25.5*  PLT 290 232    BMET  Recent Labs  02/17/16 0421 02/18/16 0412  NA 140 141  K 3.7 3.8  CL 107 109  CO2 25 25  GLUCOSE 94 84  BUN 6 7  CREATININE 0.68 0.68  CALCIUM 8.7* 8.5*   PT/INR No results for input(s): LABPROT, INR in the last 72 hours.   Recent Labs Lab 02/16/16 1236 02/17/16 0421  AST 16 10*  ALT 11* 8*  ALKPHOS 69 50  BILITOT 0.7 0.9  PROT 8.4* 6.0*  ALBUMIN 4.5 3.1*     Lipase     Component Value Date/Time   LIPASE 17 02/16/2016 1236     Studies/Results: Dg Abd 1 View  02/18/2016  CLINICAL DATA:  Nausea, vomiting and abdominal cramping. EXAM: ABDOMEN - 1 VIEW COMPARISON:  02/16/2016 FINDINGS: No significant bowel dilation is seen. Normal bowel gas pattern. No significant increase in stool. Soft tissues and skeletal  structures are unremarkable. IMPRESSION: 1. Normal supine abdomen radiograph. The small bowel obstruction suggests on the prior study is no longer evident. Electronically Signed   By: Lajean Manes M.D.   On: 02/18/2016 10:04   Dg Abd Portable 1v-small Bowel Obstruction Protocol-initial, 8 Hr Delay  02/19/2016  CLINICAL DATA:  Follow-up small bowel obstruction. 8 hour delayed film. EXAM: PORTABLE ABDOMEN - 1 VIEW COMPARISON:  02/18/2016 FINDINGS: Oral contrast material was apparently given a 8 hours ago. Contrast material is demonstrated throughout the colon and in the rectum as well as in the few distal small bowel loops. This suggest no evidence of complete obstruction. Visualized small and large bowel are not abnormally distended. IMPRESSION: Contrast material demonstrated throughout the colon suggesting no evidence of obstruction. Electronically Signed   By: Lucienne Capers M.D.   On: 02/19/2016 01:59    Medications: . enoxaparin (LOVENOX) injection  40 mg Subcutaneous QHS    Assessment/Plan Partial SBO vs functional obstruction Possible colitis Chronic pain and narcotic use Fibroids Hx of C section/tubal ligation. Tobacco use Antibiotics: None DVT: SCD/Lovenox   Plan:  This is not an issue of obstruction, but more a functional issue.  i would discontinue all narcotics, she needs to be on a bowel regime for her functional symptoms.  We will sign off.     LOS: 3 days  [name] 02/19/2016

## 2016-02-19 NOTE — Discharge Summary (Signed)
Physician Discharge Summary  [name] [id number] DOB: 03/31/1976 DOA: 02/16/2016  PCP: [facility]  Admit date: 02/16/2016 Discharge date: 02/19/2016  Time spent: 45 minutes  Recommendations for Outpatient Follow-up:  1. GI Dr.Armbruster on 5/3 at 9am for Evaluation for Colonoscopy 2. Please limit/refrain from using narcotics, without clear indication due to recurrent admissions for chronic functional constipation exacerbated/likely induced by this   Discharge Diagnoses:  Principal Problem:   Ileus   Chronic Functional constipation   Narcotic dependence   Nausea with vomiting   Ileus ([place])   Anemia   Menorrhagia  Discharge Condition: stable  Diet recommendation: Soft regular diet  Filed Weights   02/16/16 1213 02/17/16 0002  Weight: 68.04 kg (150 lb) 67.405 kg (148 lb 9.6 oz)    History of present illness:  [name] is a 40 y.o. 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 She was recently admitted from 02/07/2016 through 02/11/2016 treated for small bowel obstruction. During that hospitalization an NG tube was placed as general surgery was following. She returns to the emergency department today with complaints of abdominal pain. Reports symptoms recurred 3/6 evening having several episodes of nausea and vomiting. This was associated with generalized abdominal pain as well as some abdominal distention.  Hospital Course:  1. Ileus/Chronic constipation -4th admission in 2 months for same, always deemed to be due to narcotic use, pt reports that she wasn't prescribed any narcotics at discharge on 3/2 and didn't take any for 5days at home but back with same again -[facility]S consulted, treated with bowel rest, laxatives, cut down narcotics since this was felt to be contributing if not primary etiology -given nylytely and had a  small bowel follow through with gastrograffin yesterday, 8hours after contrast it was noted on imaging to be in the colon suggesting no obstruction, having multiple BMS today, tolerated regular diet -advised again and again on the importance of stopping narcotics, we have stopped her narcotics and advised her to avoid using this since this is felt to be the etiology for her chronic constipation/ileus if not then atleast directly contributing to it. -strong suspicion for Narcotic seeking behavior. -Seen by Dr.Mann [facility] last month, but she is unassigned per Dr.Mann and hence referred to [facility] for FU  2. Chronic back pain/menorrhagia -taking narcotics for about 20years -reports not taking any since last discharge  3. Chronic anemia -due to menorrhagia -did not prescribe iron due to ongoing GI issues, this should be considered upon FU   Consultations:  [facility]S  Discharge Exam: Filed Vitals:   02/18/16 2147 02/19/16 0522  BP: 129/67 129/80  Pulse: 67 65  Temp: 98.5 F (36.9 C) 98.2 F (36.8 C)  Resp: 18 18    General: AAOx3 Cardiovascular: S1S2/RRR Respiratory: CTAB  Discharge Instructions    Current Discharge Medication List    START taking these medications   Details  polyethylene glycol (MIRALAX / GLYCOLAX) packet Take 17 g by mouth 2 (two) times daily. Qty: 28 each, Refills: 0    senna-docusate (SENOKOT-S) 8.6-50 MG tablet Take 1 tablet by mouth 2 (two) times daily. Qty: 30 tablet, Refills: 0      CONTINUE these medications which have NOT CHANGED   Details  pantoprazole (PROTONIX) 40 MG tablet Take 1 tablet (40 mg total) by mouth daily. Qty: 30 tablet, Refills: 0      STOP taking these medications  metoCLOPramide (REGLAN) 5 MG tablet      oxyCODONE-acetaminophen (PERCOCET) 10-325 MG tablet      polyethylene glycol powder (GLYCOLAX/MIRALAX) powder      saccharomyces boulardii (FLORASTOR) 250 MG capsule      traMADol (ULTRAM-ER) 100 MG 24 hr tablet         No Known Allergies Follow-up Information    Follow up with [facility] In 1 week.   Specialty:  Internal Medicine   Contact information:   [address] [place] [address] [phone]        The results of significant diagnostics from this hospitalization (including imaging, microbiology, ancillary and laboratory) are listed below for reference.    Significant Diagnostic Studies: Dg Chest 2 View  02/09/2016  CLINICAL DATA:  Fever. EXAM: CHEST  2 VIEW COMPARISON:  February 07, 2016. FINDINGS: The heart size and mediastinal contours are within normal limits. Both lungs are clear. Nasogastric tube tip is seen in proximal stomach. No pneumothorax or pleural effusion is noted. Mild dextroscoliosis of lower thoracic spine is noted. IMPRESSION: No active cardiopulmonary disease. Electronically Signed   By: Marijo Conception, M.D.   On: 02/09/2016 15:22   Dg Abd 1 View  02/18/2016  CLINICAL DATA:  Nausea, vomiting and abdominal cramping. EXAM: ABDOMEN - 1 VIEW COMPARISON:  02/16/2016 FINDINGS: No significant bowel dilation is seen. Normal bowel gas pattern. No significant increase in stool. Soft tissues and skeletal structures are unremarkable. IMPRESSION: 1. Normal supine abdomen radiograph. The small bowel obstruction suggests on the prior study is no longer evident. Electronically Signed   By: Lajean Manes M.D.   On: 02/18/2016 10:04   Dg Abd 1 View  02/09/2016  CLINICAL DATA:  History of small bowel obstruction. Assess NG tube position. EXAM: ABDOMEN - 1 VIEW COMPARISON:  02/09/2016 at 4:28 a.m. and 02/07/2016, 02/05/2016 FINDINGS: NG tube is not significantly changed with tip over the stomach in the left upper quadrant and side-port in the expected region of the gastroesophageal junction. Contrast and air are present throughout the colon. There is an air-filled loop of dilated small bowel and left mid abdomen measuring 3.5 cm in diameter slightly improved. Less dilated small  bowel loops compared to 02/07/2016. Remainder of the exam is unchanged. IMPRESSION: Single air-filled dilated small bowel loop in the left mid abdomen with air and contrast throughout the colon compatible with resolving small bowel obstruction. Nasogastric tube unchanged with tip over the stomach in the left upper quadrant and side-port in the expected region of the gastroesophageal junction. Electronically Signed   By: Marin Olp M.D.   On: 02/09/2016 15:33   Ct Abdomen Pelvis W Contrast  01/29/2016  CLINICAL DATA:  Lower abdominal pain, nausea and vomiting, and diarrhea for 2 weeks. EXAM: CT ABDOMEN AND PELVIS WITH CONTRAST TECHNIQUE: Multidetector CT imaging of the abdomen and pelvis was performed using the standard protocol following bolus administration of intravenous contrast. CONTRAST:  160mL OMNIPAQUE IOHEXOL 300 MG/ML  SOLN COMPARISON:  01/08/2016 FINDINGS: Mild dependent changes in the lung bases. The liver, spleen, gallbladder, pancreas, adrenal glands, abdominal aorta, inferior vena cava, and retroperitoneal lymph nodes are unremarkable. Cyst in the right kidney. Renal nephrograms are symmetrical. No hydronephrosis. Stomach is decompressed. Dilated fluid-filled small bowel with decompressed terminal ileum. Transition zone is difficult to localize but appears to be in the left lower abdomen. Cause is indeterminate. The colon is decompressed but there is evidence of edema and stranding in the fat around the colon which  may indicate changes of colitis. No free air in the abdomen. No pneumatosis or portal venous gas. Pelvis: The uterus is enlarged suggesting recent postpartum state. Anterior defect in the lower uterine wall suggesting a C-section scar. Small amount of free fluid in the pelvis is likely reactive. Bladder wall is mildly thickened suggesting possible cystitis. Mild degenerative changes in the spine. No destructive bone lesions. IMPRESSION: Dilated fluid-filled small bowel with  decompressed distal small bowel and transition zone probably in the left abdomen. Changes are consistent with small bowel obstruction of nonspecific etiology. Colon is decompressed but there is evidence of fluid and infiltration around the colon suggesting colitis. Small amount of free fluid in the pelvis is likely reactive. Bladder wall thickening suggests cystitis. Presumed recent postpartum changes in the uterus consistent with C-section. Electronically Signed   By: Lucienne Capers M.D.   On: 01/29/2016 04:08   Dg Abd 2 Views  02/05/2016  CLINICAL DATA:  Follow-up small bowel obstruction EXAM: ABDOMEN - 2 VIEW COMPARISON:  Supine and upright abdominal films of February 04, 2016 FINDINGS: There remain loops of fluid and gas-filled small bowel centered in the mid and left aspect of the abdomen. Contrast and gas are present within the ascending and proximal transverse colons as well as the rectosigmoid. No free extraluminal gas collections are observed. The bony structures exhibit no acute abnormality. There is stable curvature of the thoracic and upper lumbar spine with convexity toward the right. IMPRESSION: Persistent relatively high-grade mid to distal small bowel obstruction. Previously DIS administered oral contrast has transited more of the colon. There is no evidence of obstruction. Electronically Signed   By: David  Martinique M.D.   On: 02/05/2016 07:22   Dg Abd 2 Views  02/04/2016  CLINICAL DATA:  Small bowel obstruction. Possible surgery this morning. EXAM: ABDOMEN - 2 VIEW COMPARISON:  02/03/2016 FINDINGS: Unchanged bowel gas pattern with small more than large bowel distention. Small bowel distention is improved compared to yesterday. Oral contrast still seen in the proximal colon, with interval emptying of the distal colon and rectum. No concerning intra-abdominal mass effect or calcification. No pneumoperitoneum. Lung bases are clear. IMPRESSION: Partial small bowel obstruction pattern. Small bowel  distention is improved from yesterday. Electronically Signed   By: Monte Fantasia M.D.   On: 02/04/2016 08:05   Dg Abd 2 Views  02/03/2016  CLINICAL DATA:  Follow-up small bowel obstruction EXAM: ABDOMEN - 2 VIEW COMPARISON:  02/02/2016 FINDINGS: There is slight worsening of small bowel distension with air-fluid levels in upper abdomen consistent with partial small bowel obstruction. Again noted contrast material throughout the colon. NG tube has been removed. There is no evidence of free abdominal air. IMPRESSION: There is slight worsening small bowel distension with air-fluid levels in mid and upper abdomen consistent with at least partial small bowel obstruction. Again noted contrast material throughout the colon. NG tube has been removed. Electronically Signed   By: Lahoma Crocker M.D.   On: 02/03/2016 08:45   Dg Abd 2 Views  02/02/2016  CLINICAL DATA:  Small-bowel obstruction and abdominal pain EXAM: ABDOMEN - 2 VIEW COMPARISON:  02/01/2016 FINDINGS: Scattered large and small bowel gas is noted. Contrast material is noted throughout the colon from a recent CT. Nasogastric catheter remains within the stomach. Persistent mild small bowel dilatation is seen. The overall appearance has improved slightly in the interval from the prior exam. No free air is seen. No other focal abnormality is noted. IMPRESSION: Slight improvement in the degree of  small bowel dilatation when compared with the prior study. Electronically Signed   By: Inez Catalina M.D.   On: 02/02/2016 08:28   Dg Abd 2 Views  02/01/2016  CLINICAL DATA:  Small bowel obstruction EXAM: ABDOMEN - 2 VIEW COMPARISON:  01/31/2016 FINDINGS: NG tube remains in stable position. Oral contrast material noted within the colon. Dilated small bowel loops in the mid abdomen compatible with partial small bowel obstruction. No free air organomegaly. IMPRESSION: Continued partial small bowel obstruction pattern.  No real change. Electronically Signed   By: Rolm Baptise M.D.   On: 02/01/2016 09:03   Dg Abd 2 Views  01/31/2016  CLINICAL DATA:  PT C/O CONTINUED ABD PAIN, WEAKNESS, STATES SHE HAS HAD CONSTIPATION X 1 WK, PRIOR SBO, PT HAD BM IN THE NIGHT. EXAM: ABDOMEN - 2 VIEW COMPARISON:  01/30/2016 FINDINGS: Nasogastric tube is in place, tip overlying the level of the distal stomach. Gush persistent mild dilatation of small bowel loops the dilatation has improved. There has been some progression of contrast from small bowel loops to the colon. Air-fluid levels versus within mildly dilated loops of small bowel, also slightly improved. IMPRESSION: 1. Decreased small bowel dilatation. 2. Significant residual contrast, now progressed into distal colon. Electronically Signed   By: Nolon Nations M.D.   On: 01/31/2016 10:38   Dg Abd 2 Views  01/30/2016  CLINICAL DATA:  40 year old female with lower abdominal pain, nausea and low-grade fever for 1 week. Small bowel obstruction. EXAM: ABDOMEN - 2 VIEW COMPARISON:  Prior abdominal radiograph 01/29/2016 FINDINGS: Nasogastric tube remains in good position. Persistent diffuse dilatation of multiple loops of small bowel with air-fluid levels in the upper abdomen. There has been mild interval distal progression of contrast material into the ascending colon to the level of the hepatic flexure. The lung bases are clear. No evidence of free air. IMPRESSION: 1. Persistent partial small bowel obstruction without significant interval change. 2. Slight continued progression of contrast material into the ascending colon. Electronically Signed   By: Jacqulynn Cadet M.D.   On: 01/30/2016 09:23   Dg Abd 2 Views  01/29/2016  CLINICAL DATA:  Lower abdominal pain, nausea, vomiting and diarrhea for 2 days. EXAM: ABDOMEN - 2 VIEW COMPARISON:  Abdominal radiographs January 12, 2016 FINDINGS: Scattered mildly gas distended small bowel air-fluid levels, paucity of large bowel gas. No intra-abdominal mass effect or pathologic calcification.  Phleboliths LEFT pelvis. Lung bases are clear. Lower thoracic dextroscoliosis. IMPRESSION: Small bowel air-fluid levels, paucity of large bowel gas concerning for small bowel obstruction. Electronically Signed   By: Elon Alas M.D.   On: 01/29/2016 01:42   Dg Abd Acute W/chest  02/16/2016  CLINICAL DATA:  Abdominal pain and constipation. Previous history of bowel obstruction. EXAM: DG ABDOMEN ACUTE W/ 1V CHEST COMPARISON:  02/09/2016 FINDINGS: A few mildly dilated small bowel loops containing air-fluid levels are seen within the left lower quadrant. There is also gas and stool throughout the colon. No evidence of free air. Left pelvic phlebolith noted. Heart size and mediastinal contours are normal. Both lungs are clear. Mild thoracolumbar dextroscoliosis noted. IMPRESSION: Low-grade/partial small bowel obstruction versus ileus. Continued radiographic followup recommended. No active cardiopulmonary disease. Electronically Signed   By: Earle Gell M.D.   On: 02/16/2016 16:37   Dg Abd Acute W/chest  02/07/2016  CLINICAL DATA:  Acute onset of lower abdominal pain, nausea and vomiting. Initial encounter. EXAM: DG ABDOMEN ACUTE W/ 1V CHEST COMPARISON:  Abdominal radiograph performed 02/05/2016, and  chest radiograph performed 01/09/2016 FINDINGS: The lungs are well-aerated and clear. There is no evidence of focal opacification, pleural effusion or pneumothorax. The cardiomediastinal silhouette is within normal limits. Bilateral nipple shadows are noted. The visualized bowel gas pattern is unremarkable. Scattered fluid and air are seen within the small and large bowel; there is no evidence of small bowel dilatation to suggest obstruction. No free intra-abdominal air is identified on the provided upright view. No acute osseous abnormalities are seen; the sacroiliac joints are unremarkable in appearance. Foreign densities overlying the mid pelvis are thought to be associated with the patient's pants. IMPRESSION:  1. Scattered fluid-filled loops of small and large bowel noted within the abdomen, raising question for mild dysmotility. No free intra-abdominal air seen. 2. No acute cardiopulmonary process identified. Electronically Signed   By: Garald Balding M.D.   On: 02/07/2016 23:33   Dg Abd Portable 1v-small Bowel Obstruction Protocol-initial, 8 Hr Delay  02/19/2016  CLINICAL DATA:  Follow-up small bowel obstruction. 8 hour delayed film. EXAM: PORTABLE ABDOMEN - 1 VIEW COMPARISON:  02/18/2016 FINDINGS: Oral contrast material was apparently given a 8 hours ago. Contrast material is demonstrated throughout the colon and in the rectum as well as in the few distal small bowel loops. This suggest no evidence of complete obstruction. Visualized small and large bowel are not abnormally distended. IMPRESSION: Contrast material demonstrated throughout the colon suggesting no evidence of obstruction. Electronically Signed   By: Lucienne Capers M.D.   On: 02/19/2016 01:59   Dg Abd Portable 1v-small Bowel Obstruction Protocol-initial, 8 Hr Delay  02/09/2016  CLINICAL DATA:  Assess progression of contrast through the bowel. Initial encounter. EXAM: PORTABLE ABDOMEN - 1 VIEW COMPARISON:  Abdominal radiograph performed 02/07/2016 FINDINGS: 9 hours status post administration of oral contrast, contrast has progressed to the colon. There is no evidence for bowel obstruction. The finding on the prior radiograph again likely reflects mild bowel dysmotility. Distended loops of small bowel measure up to 4.0 cm in diameter. No free intra-abdominal air is seen, though evaluation for free air is limited on a single supine view. The appendix is filled with contrast. No acute osseous abnormalities are identified. IMPRESSION: Contrast has progressed to the colon. No evidence for bowel obstruction. Distended loops of small bowel again noted, likely reflecting mild bowel dysmotility. Electronically Signed   By: Garald Balding M.D.   On:  02/09/2016 04:38   Dg Abd Portable 1v-small Bowel Obstruction Protocol-initial, 8 Hr Delay  01/29/2016  CLINICAL DATA:  Small bowel obstruction. Oral contrast given at 10:20 this morning via the patient's nasogastric tube. EXAM: PORTABLE ABDOMEN - 1 VIEW COMPARISON:  Abdomen radiographs and abdomen pelvis CT obtained earlier today. FINDINGS: Oral contrast in multiple mildly dilated small bowel loops. There is also oral contrast in normal caliber distal small bowel loops, right colon and hepatic flexure. Excreted contrast in the urinary bladder. Nasogastric tube tip in the distal stomach. Mild scoliosis. IMPRESSION: Stable partial small bowel obstruction. Electronically Signed   By: Claudie Revering M.D.   On: 01/29/2016 18:44    Microbiology: Recent Results (from the past 240 hour(s))  C difficile quick scan w PCR reflex     Status: None   Collection Time: 02/09/16  5:54 PM  Result Value Ref Range Status   C Diff antigen NEGATIVE NEGATIVE Final   C Diff toxin NEGATIVE NEGATIVE Final   C Diff interpretation Negative for toxigenic C. difficile  Final     Labs: Basic Metabolic Panel:  Recent  Labs Lab 02/16/16 1236 02/17/16 0421 02/18/16 0412  NA 138 140 141  K 4.0 3.7 3.8  CL 103 107 109  CO2 24 25 25   GLUCOSE 114* 94 84  BUN 5* 6 7  CREATININE 0.52 0.68 0.68  CALCIUM 10.0 8.7* 8.5*   Liver Function Tests:  Recent Labs Lab 02/16/16 1236 02/17/16 0421  AST 16 10*  ALT 11* 8*  ALKPHOS 69 50  BILITOT 0.7 0.9  PROT 8.4* 6.0*  ALBUMIN 4.5 3.1*    Recent Labs Lab 02/16/16 1236  LIPASE 17   No results for input(s): AMMONIA in the last 168 hours. CBC:  Recent Labs Lab 02/16/16 1236 02/17/16 0421 02/18/16 0412  WBC 8.7 5.5 5.0  HGB 11.2* 8.8* 8.0*  HCT 34.8* 27.4* 25.5*  MCV 89.0 90.1 91.4  PLT 334 290 232   Cardiac Enzymes: No results for input(s): CKTOTAL, CKMB, CKMBINDEX, TROPONINI in the last 168 hours. BNP: BNP (last 3 results) No results for input(s): BNP  in the last 8760 hours.  ProBNP (last 3 results) No results for input(s): PROBNP in the last 8760 hours.  CBG: No results for input(s): GLUCAP in the last 168 hours.     SignedDomenic Polite MD.  [facility] 02/19/2016, 11:40 AM

## 2016-02-29 ENCOUNTER — Encounter (HOSPITAL_COMMUNITY): Payer: Self-pay | Admitting: Emergency Medicine

## 2016-02-29 ENCOUNTER — Inpatient Hospital Stay (HOSPITAL_COMMUNITY)
Admission: EM | Admit: 2016-02-29 | Discharge: 2016-03-03 | DRG: 390 | Disposition: A | Discharge status: Home / Self Care | Payer: Medicaid Other | Attending: Internal Medicine | Admitting: Internal Medicine

## 2016-02-29 ENCOUNTER — Emergency Department (HOSPITAL_COMMUNITY): Payer: Medicaid Other

## 2016-02-29 DIAGNOSIS — Z8249 Family history of ischemic heart disease and other diseases of the circulatory system: Secondary | ICD-10-CM | POA: Diagnosis not present

## 2016-02-29 DIAGNOSIS — Z720 Tobacco use: Secondary | ICD-10-CM | POA: Diagnosis not present

## 2016-02-29 DIAGNOSIS — K56600 Partial intestinal obstruction, unspecified as to cause: Secondary | ICD-10-CM

## 2016-02-29 DIAGNOSIS — K5669 Other intestinal obstruction: Secondary | ICD-10-CM | POA: Diagnosis not present

## 2016-02-29 DIAGNOSIS — R109 Unspecified abdominal pain: Secondary | ICD-10-CM | POA: Diagnosis not present

## 2016-02-29 DIAGNOSIS — D649 Anemia, unspecified: Secondary | ICD-10-CM | POA: Diagnosis present

## 2016-02-29 DIAGNOSIS — F17210 Nicotine dependence, cigarettes, uncomplicated: Secondary | ICD-10-CM | POA: Diagnosis present

## 2016-02-29 DIAGNOSIS — G8929 Other chronic pain: Secondary | ICD-10-CM | POA: Diagnosis present

## 2016-02-29 DIAGNOSIS — K56609 Unspecified intestinal obstruction, unspecified as to partial versus complete obstruction: Secondary | ICD-10-CM

## 2016-02-29 DIAGNOSIS — R112 Nausea with vomiting, unspecified: Secondary | ICD-10-CM | POA: Diagnosis present

## 2016-02-29 DIAGNOSIS — K560 Paralytic ileus: Secondary | ICD-10-CM

## 2016-02-29 DIAGNOSIS — Z833 Family history of diabetes mellitus: Secondary | ICD-10-CM | POA: Diagnosis not present

## 2016-02-29 DIAGNOSIS — D259 Leiomyoma of uterus, unspecified: Secondary | ICD-10-CM | POA: Diagnosis present

## 2016-02-29 DIAGNOSIS — K567 Ileus, unspecified: Secondary | ICD-10-CM

## 2016-02-29 HISTORY — DX: Cervical disc disorder, unspecified, unspecified cervical region: M50.90

## 2016-02-29 HISTORY — DX: Low back pain, unspecified: M54.50

## 2016-02-29 HISTORY — DX: Low back pain: M54.5

## 2016-02-29 LAB — URINALYSIS, ROUTINE W REFLEX MICROSCOPIC
Bilirubin Urine: NEGATIVE
Glucose, UA: NEGATIVE mg/dL
Hgb urine dipstick: NEGATIVE
Ketones, ur: NEGATIVE mg/dL
Leukocytes, UA: NEGATIVE
Nitrite: NEGATIVE
Protein, ur: NEGATIVE mg/dL
Specific Gravity, Urine: 1.005 (ref 1.005–1.030)
pH: 6.5 (ref 5.0–8.0)

## 2016-02-29 LAB — RAPID URINE DRUG SCREEN, HOSP PERFORMED
Amphetamines: NOT DETECTED
Barbiturates: NOT DETECTED
Benzodiazepines: NOT DETECTED
Cocaine: NOT DETECTED
Opiates: NOT DETECTED
Tetrahydrocannabinol: NOT DETECTED

## 2016-02-29 LAB — COMPREHENSIVE METABOLIC PANEL
ALT: 9 U/L — ABNORMAL LOW (ref 14–54)
AST: 14 U/L — ABNORMAL LOW (ref 15–41)
Albumin: 3.6 g/dL (ref 3.5–5.0)
Alkaline Phosphatase: 73 U/L (ref 38–126)
Anion gap: 9 (ref 5–15)
BUN: 6 mg/dL (ref 6–20)
CO2: 25 mmol/L (ref 22–32)
Calcium: 9 mg/dL (ref 8.9–10.3)
Chloride: 108 mmol/L (ref 101–111)
Creatinine, Ser: 0.67 mg/dL (ref 0.44–1.00)
GFR calc Af Amer: >60 mL/min (ref >60)
GFR calc non Af Amer: >60 mL/min (ref >60)
Glucose, Bld: 114 mg/dL — ABNORMAL HIGH (ref 65–99)
Potassium: 4.2 mmol/L (ref 3.5–5.1)
Sodium: 142 mmol/L (ref 135–145)
Total Bilirubin: 0.4 mg/dL (ref 0.3–1.2)
Total Protein: 7.2 g/dL (ref 6.5–8.1)

## 2016-02-29 LAB — CBC
HCT: 31.3 % — ABNORMAL LOW (ref 36.0–46.0)
Hemoglobin: 10 g/dL — ABNORMAL LOW (ref 12.0–15.0)
MCH: 28.7 pg (ref 26.0–34.0)
MCHC: 31.9 g/dL (ref 30.0–36.0)
MCV: 89.9 fL (ref 78.0–100.0)
Platelets: 311 10*3/uL (ref 150–400)
RBC: 3.48 MIL/uL — ABNORMAL LOW (ref 3.87–5.11)
RDW: 16.5 % — ABNORMAL HIGH (ref 11.5–15.5)
WBC: 9.8 10*3/uL (ref 4.0–10.5)

## 2016-02-29 LAB — LIPASE, BLOOD: Lipase: 15 U/L (ref 11–51)

## 2016-02-29 MED ORDER — HYDROMORPHONE HCL 1 MG/ML IJ SOLN
1.0000 mg | INTRAMUSCULAR | Status: AC | PRN
  Administered 2016-02-29 – 2016-03-01 (×3): 1 mg via INTRAVENOUS
  Filled 2016-02-29 (×3): qty 1

## 2016-02-29 MED ORDER — LIP MEDEX EX OINT
TOPICAL_OINTMENT | CUTANEOUS | Status: DC | PRN
  Administered 2016-02-29: 21:00:00 via TOPICAL
  Filled 2016-02-29: qty 7

## 2016-02-29 MED ORDER — KETOROLAC TROMETHAMINE 30 MG/ML IJ SOLN
30.0000 mg | Freq: Once | INTRAMUSCULAR | Status: AC
  Administered 2016-02-29: 30 mg via INTRAVENOUS
  Filled 2016-02-29: qty 1

## 2016-02-29 MED ORDER — ENOXAPARIN SODIUM 40 MG/0.4ML ~~LOC~~ SOLN
40.0000 mg | SUBCUTANEOUS | Status: DC
  Filled 2016-02-29 (×4): qty 0.4

## 2016-02-29 MED ORDER — LORAZEPAM 2 MG/ML IJ SOLN
1.0000 mg | Freq: Once | INTRAMUSCULAR | Status: AC
  Administered 2016-02-29: 1 mg via INTRAVENOUS
  Filled 2016-02-29: qty 1

## 2016-02-29 MED ORDER — SODIUM CHLORIDE 0.9 % IV SOLN
INTRAVENOUS | Status: DC
  Administered 2016-02-29 – 2016-03-02 (×4): via INTRAVENOUS

## 2016-02-29 MED ORDER — IOHEXOL 300 MG/ML  SOLN
50.0000 mL | Freq: Once | INTRAMUSCULAR | Status: AC | PRN
  Administered 2016-02-29: 50 mL via ORAL

## 2016-02-29 MED ORDER — IOPAMIDOL (ISOVUE-300) INJECTION 61%
100.0000 mL | Freq: Once | INTRAVENOUS | Status: AC | PRN
  Administered 2016-02-29: 100 mL via INTRAVENOUS

## 2016-02-29 MED ORDER — HYDROMORPHONE HCL 1 MG/ML IJ SOLN
1.0000 mg | Freq: Once | INTRAMUSCULAR | Status: AC
  Administered 2016-02-29: 1 mg via INTRAVENOUS
  Filled 2016-02-29: qty 1

## 2016-02-29 MED ORDER — ONDANSETRON HCL 4 MG/2ML IJ SOLN
4.0000 mg | Freq: Once | INTRAMUSCULAR | Status: AC
  Administered 2016-02-29: 4 mg via INTRAVENOUS
  Filled 2016-02-29: qty 2

## 2016-02-29 MED ORDER — HYDROMORPHONE HCL 1 MG/ML IJ SOLN: 1.0000 mg | Freq: Once | INTRAMUSCULAR | Status: DC

## 2016-02-29 MED ORDER — LIDOCAINE VISCOUS 2 % MT SOLN
15.0000 mL | Freq: Once | OROMUCOSAL | Status: AC
  Administered 2016-02-29: 15 mL via OROMUCOSAL
  Filled 2016-02-29: qty 15

## 2016-02-29 MED ORDER — PANTOPRAZOLE SODIUM 40 MG IV SOLR
40.0000 mg | INTRAVENOUS | Status: DC
  Administered 2016-02-29 – 2016-03-02 (×3): 40 mg via INTRAVENOUS
  Filled 2016-02-29 (×4): qty 40

## 2016-02-29 MED ORDER — SODIUM CHLORIDE 0.9 % IV BOLUS (SEPSIS)
1000.0000 mL | Freq: Once | INTRAVENOUS | Status: AC
  Administered 2016-02-29: 1000 mL via INTRAVENOUS

## 2016-02-29 MED ORDER — ONDANSETRON HCL 4 MG/2ML IJ SOLN
4.0000 mg | Freq: Four times a day (QID) | INTRAMUSCULAR | Status: DC | PRN
  Administered 2016-03-01 (×2): 4 mg via INTRAVENOUS
  Filled 2016-02-29 (×2): qty 2

## 2016-02-29 MED ORDER — MORPHINE SULFATE (PF) 4 MG/ML IV SOLN
4.0000 mg | Freq: Once | INTRAVENOUS | Status: AC
  Administered 2016-02-29: 4 mg via INTRAVENOUS
  Filled 2016-02-29: qty 1

## 2016-02-29 MED ORDER — HYDROMORPHONE HCL 1 MG/ML IJ SOLN
0.5000 mg | INTRAMUSCULAR | Status: DC | PRN
  Administered 2016-02-29 (×3): 0.5 mg via INTRAVENOUS
  Filled 2016-02-29 (×3): qty 1

## 2016-02-29 NOTE — Progress Notes (Signed)
We are aware of patient's admission.  Dr. Cristina Gong, who has seen patient in past, will see patient in the morning.

## 2016-02-29 NOTE — H&P (Signed)
[facility] History and Physical  [name] [id number] DOB: 1976-05-05 DOA: 02/29/2016  PCP: [facility]   Chief Complaint: Recurrent abdominal pain, nausea, vomiting  HPI: [name] is a 40 y.o. woman with a recent history of constipation, ileus, and colitis who presents for the 5th time in the past 2 months for evaluation of recurrent abdominal pain with nausea and vomiting.  CT scan suggests high grade small bowel obstruction, though the patient reports that she has had a small amount of stool output since being in the ED.  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  The patient insists that she dramatically reduced her narcotic use (though she has needed IV narcotics for pain control during her hospital admissions), but the symptoms are still recurring.  She has been followed by general surgery during her previous admissions, but she has never had to go to the OR.  She has typically improved with bowel rest, IV fluids, and NG tube for decompression.  The patient and her family insist that she was still symptomatic at her last discharge.   She reports that she is currently using tramadol for pain and zofran for nausea.  She has had less vomiting since her most recent discharge on 3/10 because she has been using the Zofran.  No documented fever.  She has had light-headedness but no LOC.  She has chest pain and shortness of breath that she relates to the severity of her abdominal pain.  She has not had any evidence of blood in her urine or stool.  She has actually been back to work and tolerating a regular diet, despite her abdominal pain.  Of note, outpatient GI referral was suggested at her previous discharge, but she could not get an appointment until May.  Hospitalist asked to admit.  NG tube  has been placed by the ED physician.  General surgery consult has been requested from the ED.  Family is requesting [facility] consult as well.  Review of Systems: 12 systems reviewed and negative except as stated in the HPI.  Past Medical History  Diagnosis Date  . Heart murmur   . Fibroids   . Anemia   . Low back pain   . Cervical disc disease    Past Surgical History  Procedure Laterality Date  . Cesarean section    . Tubal ligation    . Ankle surgery    C/S x 4  Social History:  Social History   Social History Narrative  Active tobacco use.  She denies EtOH or illicit drug use.  No Known Allergies  Family History  Problem Relation Age of Onset  . Diabetes Maternal Grandmother   . Hypertension Maternal Aunt   . Bowel Disease Mother   Mother has required LOA for SBO; she has a history of hernia repair.  Prior to Admission medications   Medication Sig Start Date End Date Taking? Authorizing Provider  Bisacodyl (DULCOLAX PO) Take 1 tablet by mouth 3 (three) times daily as needed (constipation).   Yes Historical Provider, MD  Ondansetron HCl (ZOFRAN PO) Take 1 tablet by mouth 2 (two) times daily as needed (nausea).   Yes Historical Provider, MD  pantoprazole (PROTONIX) 40 MG tablet Take 1 tablet (40 mg total) by mouth daily. 02/11/16  Yes Donne Hazel, MD  polyethylene glycol (MIRALAX / GLYCOLAX) packet Take 17 g by mouth 2 (two) times daily. 02/19/16  Yes Domenic Polite, MD  senna-docusate (SENOKOT-S) 8.6-50 MG tablet Take 1 tablet by mouth 2 (two) times daily. 02/19/16  Yes Domenic Polite, MD   Physical Exam: Filed Vitals:   02/29/16 1115 02/29/16 1130 02/29/16 1200 02/29/16 1230  BP:  129/67 126/75 142/80  Pulse: 77 69 78 77  Temp:      TempSrc:      Resp:  20 20 18   SpO2: 99% 98% 99% 100%     General:  Awake and alert.  Oriented to person, place, time and situation.  NAD.  Eyes: PERRL bilaterally, conjunctiva are pink.  EOMI.  ENT: Moist mucous membranes.  No  nasal drainage.  NG tube in place.  Neck: Supple.   Cardiovascular: NR/RR.  Systolic murmur heard throughout precordium.  No LE edema.  Respiratory: CTA bilaterally.  Abdomen: Mildly distended but soft and compressible.  She is not guarding.  No significant tenderness.  Bowel sounds are hypoactive.  Skin: Warm and dry.  Musculoskeletal: Moves all four extremities spontaneously.  Psychiatric: Normal affect.  Neurologic: No focal deficits.   Labs on Admission:  Basic Metabolic Panel:  Recent Labs Lab 02/29/16 0854  NA 142  K 4.2  CL 108  CO2 25  GLUCOSE 114*  BUN 6  CREATININE 0.67  CALCIUM 9.0   Liver Function Tests:  Recent Labs Lab 02/29/16 0854  AST 14*  ALT 9*  ALKPHOS 73  BILITOT 0.4  PROT 7.2  ALBUMIN 3.6    Recent Labs Lab 02/29/16 0854  LIPASE 15   CBC:  Recent Labs Lab 02/29/16 0854  WBC 9.8  HGB 10.0*  HCT 31.3*  MCV 89.9  PLT 311   Radiological Exams on Admission: Ct Abdomen Pelvis W Contrast  02/29/2016  CLINICAL DATA:  Abdominal pain and nausea. History of small bowel obstruction. EXAM: CT ABDOMEN AND PELVIS WITH CONTRAST TECHNIQUE: Multidetector CT imaging of the abdomen and pelvis was performed using the standard protocol following bolus administration of intravenous contrast. CONTRAST:  151mL ISOVUE-300 IOPAMIDOL (ISOVUE-300) INJECTION 61% COMPARISON:  CT abdomen pelvis 01/29/2016 FINDINGS: Lower chest:  Lung bases clear without infiltrate or effusion Hepatobiliary: Normal liver.  Gallbladder and bile ducts normal. Pancreas: Negative Spleen: Negative Adrenals/Urinary Tract: 3 cm right renal cyst unchanged. No renal mass or obstruction. No ureteral calculi. Urinary bladder normal. Stomach/Bowel: Dilated small bowel with air-fluid levels consistent with small bowel obstruction. Proximal small bowel measures approximately 4 cm in diameter. Mid small bowel measures 4.8 cm in diameter. Small bowel dilatation has progressed since the prior CT.  Colon is decompressed. Terminal ileum decompressed. There is a multilocular cyst in the right pelvis measuring 6.8 x 6.6 cm. This is likely related to the right ovary and has developed since the prior study. There was a small cyst on the right ovary previously. It is unclear if this is related to the bowel obstruction. Small amount of free fluid in the pelvis likely related to cyst rupture. Vascular/Lymphatic: Aorta and IVC normal. Prominent gonadal vein bilaterally right greater than left. Hypervascular right adnexum with large gonadal vein similar to the prior study. Reproductive: Uterus mildly enlarged compatible with recent delivery. Multilocular right adnexal cyst measures 6.8 x 6.6 cm and has increased in size since the prior study. This is likely ovarian in nature. There is a small amount of free fluid. Hypervascularity right adnexum most consistent with enlarged gonadal vein extending down to  the cyst. Other: Negative for lymphadenopathy Musculoskeletal: No fracture or acute bony abnormality IMPRESSION: Moderate to high-grade small bowel obstruction, increased in dilatation of small bowel since 01/29/2016. Complex cyst right adnexa most consistent with ovarian cyst. It is unclear if this is related to the small bowel obstruction. There is a small amount of free fluid in the pelvis. Electronically Signed   By: Franchot Gallo M.D.   On: 02/29/2016 11:27    Assessment/Plan Principal Problem:   Small bowel obstruction (HCC) Active Problems:   Tobacco abuse   Recurrent abdominal pain   Chronic anemia  Admit to med surg General Surgery consult requested from the ED I am still waiting on a call back from [facility] Bowel rest NG tube to low intermittent suction NS at 100cc/hr Anti-emetics and analgesics as needed   Code Status: FULL Family Communication: Mother, son at bedside Disposition Plan: Inpatient status, expect her to be here at least two midnights  Time spent: 70  minutes  [employer] [facility]  02/29/2016, 12:52 PM

## 2016-02-29 NOTE — ED Notes (Signed)
Patient transported to CT 

## 2016-02-29 NOTE — ED Notes (Signed)
MD at bedside. 

## 2016-02-29 NOTE — ED Provider Notes (Addendum)
CSN: [id number]     Arrival date & time 02/29/16  0804 History   First MD Initiated Contact with Patient 02/29/16 [phone]     Chief Complaint  Patient presents with  . Abdominal Pain     HPI Pt presents to the ER with complaints of generalized abdominal pain. Moderate to severe in Severity.  She reports nausea and vomiting.  She states that she did have possibly one small diarrheal episode yesterday.  She's been trying laxatives there is prescribed after her last small bowel obstruction reports this does not seem to be improving her symptoms.  She's been seen several times this year for small bowel obstructions.  General surgery was much this may be functional from narcotics received in the hospital.  Patient denies use of narcotics outside the hospital.  She denies fevers and chills.  No urinary complaints.  She has not seen gastroenterology at this point.  Patient had a small bowel follow-through which demonstrated contrast that traversed the entire small bowel and colon.   Past Medical History  Diagnosis Date  . Heart murmur   . Fibroids   . Anemia    Past Surgical History  Procedure Laterality Date  . Cesarean section    . Tubal ligation    . Ankle surgery     Family History  Problem Relation Age of Onset  . Diabetes Maternal Grandmother   . Hypertension Maternal Aunt    Social History  Substance Use Topics  . Smoking status: Current Every Day Smoker -- 0.50 packs/day    Types: Cigarettes  . Smokeless tobacco: Never Used  . Alcohol Use: No   OB History    Gravida Para Term Preterm AB TAB SAB Ectopic Multiple Living   4 4  4      4      Review of Systems  All other systems reviewed and are negative.     Allergies  Review of patient's allergies indicates no known allergies.  Home Medications   Prior to Admission medications   Medication Sig Start Date End Date Taking? Authorizing Provider  Bisacodyl (DULCOLAX PO) Take 1 tablet by mouth 3 (three) times daily as  needed (constipation).   Yes Historical Provider, MD  Ondansetron HCl (ZOFRAN PO) Take 1 tablet by mouth 2 (two) times daily as needed (nausea).   Yes Historical Provider, MD  pantoprazole (PROTONIX) 40 MG tablet Take 1 tablet (40 mg total) by mouth daily. 02/11/16  Yes Donne Hazel, MD  polyethylene glycol [facility] Floria Raveling) packet Take 17 g by mouth 2 (two) times daily. 02/19/16  Yes Domenic Polite, MD  senna-docusate (SENOKOT-S) 8.6-50 MG tablet Take 1 tablet by mouth 2 (two) times daily. 02/19/16  Yes Domenic Polite, MD   BP 129/67 mmHg  Pulse 69  Temp(Src) 98.5 F (36.9 C) (Oral)  Resp 20  SpO2 98%  LMP 02/09/2016 (Approximate) Physical Exam  Constitutional: She is oriented to person, place, and time. She appears well-developed and well-nourished. No distress.  HENT:  Head: Normocephalic and atraumatic.  Eyes: EOM are normal.  Neck: Normal range of motion.  Cardiovascular: Normal rate, regular rhythm and normal heart sounds.   Pulmonary/Chest: Effort normal and breath sounds normal.  Abdominal: Soft. She exhibits no distension.  Mild generalized abdominal tenderness without focality.  Musculoskeletal: Normal range of motion.  Neurological: She is alert and oriented to person, place, and time.  Skin: Skin is warm and dry.  Psychiatric: She has a normal mood and affect. Judgment normal.  Nursing note and vitals reviewed.   ED Course  NG placement Performed by: Jola Schmidt Authorized by: Jola Schmidt Consent: Verbal consent obtained. Risks and benefits: risks, benefits and alternatives were discussed Consent given by: patient Patient identity confirmed: verbally with patient Time out: Immediately prior to procedure a "time out" was called to verify the correct patient, procedure, equipment, support staff and site/side marked as required. Local anesthesia used: yes Local anesthetic: topical anesthetic Patient tolerance: Patient tolerated the procedure well with no  immediate complications   (including critical care time) Labs Review Labs Reviewed  COMPREHENSIVE METABOLIC PANEL - Abnormal; Notable for the following:    Glucose, Bld 114 (*)    AST 14 (*)    ALT 9 (*)    All other components within normal limits  CBC - Abnormal; Notable for the following:    RBC 3.48 (*)    Hemoglobin 10.0 (*)    HCT 31.3 (*)    RDW 16.5 (*)    All other components within normal limits  LIPASE, BLOOD  URINALYSIS, ROUTINE W REFLEX MICROSCOPIC (NOT AT [facility])  URINE RAPID DRUG SCREEN, HOSP PERFORMED    Imaging Review Ct Abdomen Pelvis W Contrast  02/29/2016  CLINICAL DATA:  Abdominal pain and nausea. History of small bowel obstruction. EXAM: CT ABDOMEN AND PELVIS WITH CONTRAST TECHNIQUE: Multidetector CT imaging of the abdomen and pelvis was performed using the standard protocol following bolus administration of intravenous contrast. CONTRAST:  167mL ISOVUE-300 IOPAMIDOL (ISOVUE-300) INJECTION 61% COMPARISON:  CT abdomen pelvis 01/29/2016 FINDINGS: Lower chest:  Lung bases clear without infiltrate or effusion Hepatobiliary: Normal liver.  Gallbladder and bile ducts normal. Pancreas: Negative Spleen: Negative Adrenals/Urinary Tract: 3 cm right renal cyst unchanged. No renal mass or obstruction. No ureteral calculi. Urinary bladder normal. Stomach/Bowel: Dilated small bowel with air-fluid levels consistent with small bowel obstruction. Proximal small bowel measures approximately 4 cm in diameter. Mid small bowel measures 4.8 cm in diameter. Small bowel dilatation has progressed since the prior CT. Colon is decompressed. Terminal ileum decompressed. There is a multilocular cyst in the right pelvis measuring 6.8 x 6.6 cm. This is likely related to the right ovary and has developed since the prior study. There was a small cyst on the right ovary previously. It is unclear if this is related to the bowel obstruction. Small amount of free fluid in the pelvis likely related to cyst  rupture. Vascular/Lymphatic: Aorta and IVC normal. Prominent gonadal vein bilaterally right greater than left. Hypervascular right adnexum with large gonadal vein similar to the prior study. Reproductive: Uterus mildly enlarged compatible with recent delivery. Multilocular right adnexal cyst measures 6.8 x 6.6 cm and has increased in size since the prior study. This is likely ovarian in nature. There is a small amount of free fluid. Hypervascularity right adnexum most consistent with enlarged gonadal vein extending down to the cyst. Other: Negative for lymphadenopathy Musculoskeletal: No fracture or acute bony abnormality IMPRESSION: Moderate to high-grade small bowel obstruction, increased in dilatation of small bowel since 01/29/2016. Complex cyst right adnexa most consistent with ovarian cyst. It is unclear if this is related to the small bowel obstruction. There is a small amount of free fluid in the pelvis. Electronically Signed   By: Franchot Gallo M.D.   On: 02/29/2016 11:27   I have personally reviewed and evaluated these images and lab results as part of my medical decision-making.   EKG Interpretation None      MDM   Final diagnoses:  Small bowel obstruction (HCC)    Question of high-grade small bowel obstruction again.  NG tube to be placed.  General surgery consultation.  Hospitalist admission.    11:48 AM Spoke with Will Creig Hines, surgery, who will see the pt in the ER. NG tube now. hospitalist for admission. GSU suspect this is more functional in nature.   NG tube placed by myself as nursing staff was not comfortable with the procedure.   Jola Schmidt, MD 02/29/16 [place], MD 02/29/16 1400

## 2016-02-29 NOTE — Progress Notes (Signed)
Subjective: CC:  Abdominal pain and "not a normal bowel movement" Pt well know and hospitalized with "SBO."  02/16/16-02/19/16 and 02/07/16-02/11/16.  She had SB protocol on 02/09/16 and 02/19/16.  Contrast went thru on both admits with no issue. It was Dr. Rosendo Gros and Dr. Manon Hilding opinion this is functional.  She is back with pain more in the upper abdomen.  She reports using no narcotics since discharge.  Her first appointment time with Dr. Collene Mares is in May.   Objective: Vital signs in last 24 hours: Temp:  [98.5 F (36.9 C)] 98.5 F (36.9 C) (03/20 0820) Pulse Rate:  [64-82] 69 (03/20 1130) Resp:  [16-20] 20 (03/20 1130) BP: (122-129)/(67-79) 129/67 mmHg (03/20 1130) SpO2:  [98 %-100 %] 98 % (03/20 1130)   Afebrile, VSS Labs are normal UA is negative CT again show:  Moderate to high-grade small bowel obstruction, increased in dilatation of small bowel since 01/29/2016. Intake/Output from previous day:   Intake/Output this shift:    General appearance: alert, cooperative, no distress and NG in place, ongoing abdominal discomfort more upper abdomen today.   GI: soft, minimal distension, BS hypoacitve.  complains of pain with palpation uppper abdomen today.    Lab Results:   Recent Labs  02/29/16 0854  WBC 9.8  HGB 10.0*  HCT 31.3*  PLT 311    BMET  Recent Labs  02/29/16 0854  NA 142  K 4.2  CL 108  CO2 25  GLUCOSE 114*  BUN 6  CREATININE 0.67  CALCIUM 9.0   PT/INR No results for input(s): LABPROT, INR in the last 72 hours.   Recent Labs Lab 02/29/16 0854  AST 14*  ALT 9*  ALKPHOS 73  BILITOT 0.4  PROT 7.2  ALBUMIN 3.6     Lipase     Component Value Date/Time   LIPASE 15 02/29/2016 0854     Studies/Results: Ct Abdomen Pelvis W Contrast  02/29/2016  CLINICAL DATA:  Abdominal pain and nausea. History of small bowel obstruction. EXAM: CT ABDOMEN AND PELVIS WITH CONTRAST TECHNIQUE: Multidetector CT imaging of the abdomen and pelvis was performed using  the standard protocol following bolus administration of intravenous contrast. CONTRAST:  165mL ISOVUE-300 IOPAMIDOL (ISOVUE-300) INJECTION 61% COMPARISON:  CT abdomen pelvis 01/29/2016 FINDINGS: Lower chest:  Lung bases clear without infiltrate or effusion Hepatobiliary: Normal liver.  Gallbladder and bile ducts normal. Pancreas: Negative Spleen: Negative Adrenals/Urinary Tract: 3 cm right renal cyst unchanged. No renal mass or obstruction. No ureteral calculi. Urinary bladder normal. Stomach/Bowel: Dilated small bowel with air-fluid levels consistent with small bowel obstruction. Proximal small bowel measures approximately 4 cm in diameter. Mid small bowel measures 4.8 cm in diameter. Small bowel dilatation has progressed since the prior CT. Colon is decompressed. Terminal ileum decompressed. There is a multilocular cyst in the right pelvis measuring 6.8 x 6.6 cm. This is likely related to the right ovary and has developed since the prior study. There was a small cyst on the right ovary previously. It is unclear if this is related to the bowel obstruction. Small amount of free fluid in the pelvis likely related to cyst rupture. Vascular/Lymphatic: Aorta and IVC normal. Prominent gonadal vein bilaterally right greater than left. Hypervascular right adnexum with large gonadal vein similar to the prior study. Reproductive: Uterus mildly enlarged compatible with recent delivery. Multilocular right adnexal cyst measures 6.8 x 6.6 cm and has increased in size since the prior study. This is likely ovarian in nature. There is a small amount  of free fluid. Hypervascularity right adnexum most consistent with enlarged gonadal vein extending down to the cyst. Other: Negative for lymphadenopathy Musculoskeletal: No fracture or acute bony abnormality IMPRESSION: Moderate to high-grade small bowel obstruction, increased in dilatation of small bowel since 01/29/2016. Complex cyst right adnexa most consistent with ovarian cyst. It  is unclear if this is related to the small bowel obstruction. There is a small amount of free fluid in the pelvis. Electronically Signed   By: Franchot Gallo M.D.   On: 02/29/2016 11:27    Medications:   Assessment/Plan Partial SBO vs functional obstruction third admission since 02/07/16. Negative SB protocol with good movement of contrast to the colon 2/28 & 02/19/16 Possible colitis Chronic pain and narcotic use Fibroids Hx of C section/tubal ligation. Tobacco use Antibiotics: None DVT: awaiting admission orders  Plan:  Agree with decompression. IV Hydration, continue to keep her off narcotics if possible. Decompress with NG, I will order new films in AM.           Earnstine Regal 02/29/2016

## 2016-02-29 NOTE — ED Notes (Signed)
Patient stated that morphine does not work for her and is requesting dilaudid. Dr. Venora Maples made aware. Dr. Venora Maples states if patient does not want the morphine, he will come speak to the patient soon regarding her pain. Patient made aware and agreed to try morphine.

## 2016-02-29 NOTE — ED Notes (Signed)
Pt was d/c from [facility] on 3/10 after a small bowel obstruction. Pt presents to ED today stating "I think I have another obstruction." Pt c/o generalized abdominal pain similar to the pain she felt with the obstruction. Pt c/o N/V. Pt just has small "not normal" bowel movement a few minutes ago after taking a  laxative. A&Ox4 and ambulatory.

## 2016-02-29 NOTE — ED Notes (Signed)
PT aware of urine sample 

## 2016-02-29 NOTE — ED Notes (Signed)
Hospitalist at bedside 

## 2016-02-29 NOTE — ED Notes (Signed)
Patient made aware that urine sample is needed. Patient is encouraged to void when able.

## 2016-02-29 NOTE — Progress Notes (Addendum)
Pt states she did not get to follow up with her pcp after d/c  Pt drowsy Needing to have her name called x 3 prior to responding to Cm

## 2016-03-01 ENCOUNTER — Inpatient Hospital Stay (HOSPITAL_COMMUNITY): Payer: Medicaid Other

## 2016-03-01 LAB — BASIC METABOLIC PANEL
Anion gap: 6 (ref 5–15)
BUN: 7 mg/dL (ref 6–20)
CO2: 25 mmol/L (ref 22–32)
Calcium: 8.5 mg/dL — ABNORMAL LOW (ref 8.9–10.3)
Chloride: 111 mmol/L (ref 101–111)
Creatinine, Ser: 0.63 mg/dL (ref 0.44–1.00)
GFR calc Af Amer: >60 mL/min (ref >60)
GFR calc non Af Amer: >60 mL/min (ref >60)
Glucose, Bld: 91 mg/dL (ref 65–99)
Potassium: 3.7 mmol/L (ref 3.5–5.1)
Sodium: 142 mmol/L (ref 135–145)

## 2016-03-01 LAB — CBC
HCT: 27.6 % — ABNORMAL LOW (ref 36.0–46.0)
Hemoglobin: 8.7 g/dL — ABNORMAL LOW (ref 12.0–15.0)
MCH: 28.7 pg (ref 26.0–34.0)
MCHC: 31.5 g/dL (ref 30.0–36.0)
MCV: 91.1 fL (ref 78.0–100.0)
Platelets: 277 10*3/uL (ref 150–400)
RBC: 3.03 MIL/uL — ABNORMAL LOW (ref 3.87–5.11)
RDW: 16.7 % — ABNORMAL HIGH (ref 11.5–15.5)
WBC: 4.8 10*3/uL (ref 4.0–10.5)

## 2016-03-01 MED ORDER — KETOROLAC TROMETHAMINE 30 MG/ML IJ SOLN
30.0000 mg | Freq: Once | INTRAMUSCULAR | Status: AC
  Administered 2016-03-01: 30 mg via INTRAVENOUS
  Filled 2016-03-01: qty 1

## 2016-03-01 MED ORDER — KETOROLAC TROMETHAMINE 30 MG/ML IJ SOLN
30.0000 mg | Freq: Four times a day (QID) | INTRAMUSCULAR | Status: DC | PRN
  Administered 2016-03-01 – 2016-03-03 (×5): 30 mg via INTRAVENOUS
  Filled 2016-03-01 (×5): qty 1

## 2016-03-01 MED ORDER — HYDROMORPHONE HCL 1 MG/ML IJ SOLN
0.5000 mg | INTRAMUSCULAR | Status: DC | PRN
  Administered 2016-03-01 – 2016-03-03 (×8): 0.5 mg via INTRAVENOUS
  Filled 2016-03-01 (×8): qty 1

## 2016-03-01 MED ORDER — HYDROMORPHONE HCL 1 MG/ML IJ SOLN
1.0000 mg | Freq: Once | INTRAMUSCULAR | Status: AC
  Administered 2016-03-01: 1 mg via INTRAVENOUS
  Filled 2016-03-01: qty 1

## 2016-03-01 MED ORDER — LORAZEPAM 2 MG/ML IJ SOLN
0.5000 mg | Freq: Once | INTRAMUSCULAR | Status: AC
  Administered 2016-03-01: 0.5 mg via INTRAVENOUS
  Filled 2016-03-01: qty 1

## 2016-03-01 MED ORDER — MORPHINE SULFATE (PF) 2 MG/ML IV SOLN
2.0000 mg | Freq: Once | INTRAVENOUS | Status: AC
  Administered 2016-03-01: 2 mg via INTRAVENOUS
  Filled 2016-03-01: qty 1

## 2016-03-01 MED ORDER — LORAZEPAM 2 MG/ML IJ SOLN
0.5000 mg | Freq: Four times a day (QID) | INTRAMUSCULAR | Status: DC | PRN
  Administered 2016-03-02 – 2016-03-03 (×5): 0.5 mg via INTRAVENOUS
  Filled 2016-03-01 (×5): qty 1

## 2016-03-01 MED ORDER — MENTHOL 3 MG MT LOZG
1.0000 | LOZENGE | OROMUCOSAL | Status: DC | PRN
  Filled 2016-03-01: qty 9

## 2016-03-01 NOTE — Progress Notes (Signed)
[facility] PROGRESS NOTE  [name] [id number] DOB: 1976/02/02 DOA: 02/29/2016 PCP: [facility]  Assessment/Plan: 1. Recurrent partial small bowel obstruction -Unfortunately she has had multiple hospitalizations related to this over the past 2 months. -X-ray on admission revealing findings consistent with partial small bowel obstruction -Follow-up CT scan with contrast now showing contrast in colon and rectum, without evidence of intestinal obstruction. -She was seen and evaluated by general surgery who felt this was largely functional and did not recommend diagnostic laparoscopy with lysis at this time. -She continues to request Dilaudid, however, as recommended by GI  attempting to limit narcotic analgesics. She was given 1 dose of morphine 2 mg IV today. Will try to manage pain symptoms with Toradol. -Meanwhile general surgery recommending clamping NG and given trial of clears.  2.  Nausea/vomiting -Likely secondary to recurrent partial small bowel obstruction -That is post NG tube placement -Clamping to today, given trial of clears -Continue as needed IV antiemetic therapy  Code Status: Full code Family Communication:  Disposition Plan:    Consultants:  GI  General surgery  Procedures:  NG tube placement on 02/29/2016  HPI/Subjective: [name] is a 40 year old female having multiple previous hospitalizations for ileus/small bowel obstructions, recently admitted from 02/16/2016 through 02/19/2016 treated for ileus felt to be related to narcotic use. She was not discharged on narcotics on her last hospitalization. He presented with recurrent abdominal pain, nausea, vomiting. An NG tube was placed. Abdominal x-ray reported by radiology to reveal findings consistent with partial small bowel obstruction.  Objective: Filed Vitals:   03/01/16 0513 03/01/16 1000  BP: 102/58 115/58  Pulse: 58 60  Temp: 98.1 F (36.7 C) 98.4 F (36.9 C)  Resp: 18 18     Intake/Output Summary (Last 24 hours) at 03/01/16 1409 Last data filed at 03/01/16 0620  Gross per 24 hour  Intake      0 ml  Output    950 ml  Net   -950 ml   Filed Weights   02/29/16 1755  Weight: 70.761 kg (156 lb)    Exam:   General:  She is awake and alert, nontoxic-appearing, NG tube in place  Cardiovascular: Regular rate rhythm normal S1-S2 no murmurs rubs or gallops  Respiratory: Normal respiratory effort  Abdomen: Soft nondistended mild generalized tenderness to palpation  Musculoskeletal: No edema  Data Reviewed: Basic Metabolic Panel:  Recent Labs Lab 02/29/16 0854 03/01/16 0552  NA 142 142  K 4.2 3.7  CL 108 111  CO2 25 25  GLUCOSE 114* 91  BUN 6 7  CREATININE 0.67 0.63  CALCIUM 9.0 8.5*   Liver Function Tests:  Recent Labs Lab 02/29/16 0854  AST 14*  ALT 9*  ALKPHOS 73  BILITOT 0.4  PROT 7.2  ALBUMIN 3.6    Recent Labs Lab 02/29/16 0854  LIPASE 15   No results for input(s): AMMONIA in the last 168 hours. CBC:  Recent Labs Lab 02/29/16 0854 03/01/16 0552  WBC 9.8 4.8  HGB 10.0* 8.7*  HCT 31.3* 27.6*  MCV 89.9 91.1  PLT 311 277   Cardiac Enzymes: No results for input(s): CKTOTAL, CKMB, CKMBINDEX, TROPONINI in the last 168 hours. BNP (last 3 results) No results for input(s): BNP in the last 8760 hours.  ProBNP (last 3 results) No results for input(s): PROBNP in the last 8760 hours.  CBG: No results for input(s): GLUCAP in the last 168 hours.  No results found for this or any previous visit (from  the past 240 hour(s)).   Studies: Ct Abdomen Pelvis W Contrast  02/29/2016  CLINICAL DATA:  Abdominal pain and nausea. History of small bowel obstruction. EXAM: CT ABDOMEN AND PELVIS WITH CONTRAST TECHNIQUE: Multidetector CT imaging of the abdomen and pelvis was performed using the standard protocol following bolus administration of intravenous contrast. CONTRAST:  164mL ISOVUE-300 IOPAMIDOL (ISOVUE-300) INJECTION 61%  COMPARISON:  CT abdomen pelvis 01/29/2016 FINDINGS: Lower chest:  Lung bases clear without infiltrate or effusion Hepatobiliary: Normal liver.  Gallbladder and bile ducts normal. Pancreas: Negative Spleen: Negative Adrenals/Urinary Tract: 3 cm right renal cyst unchanged. No renal mass or obstruction. No ureteral calculi. Urinary bladder normal. Stomach/Bowel: Dilated small bowel with air-fluid levels consistent with small bowel obstruction. Proximal small bowel measures approximately 4 cm in diameter. Mid small bowel measures 4.8 cm in diameter. Small bowel dilatation has progressed since the prior CT. Colon is decompressed. Terminal ileum decompressed. There is a multilocular cyst in the right pelvis measuring 6.8 x 6.6 cm. This is likely related to the right ovary and has developed since the prior study. There was a small cyst on the right ovary previously. It is unclear if this is related to the bowel obstruction. Small amount of free fluid in the pelvis likely related to cyst rupture. Vascular/Lymphatic: Aorta and IVC normal. Prominent gonadal vein bilaterally right greater than left. Hypervascular right adnexum with large gonadal vein similar to the prior study. Reproductive: Uterus mildly enlarged compatible with recent delivery. Multilocular right adnexal cyst measures 6.8 x 6.6 cm and has increased in size since the prior study. This is likely ovarian in nature. There is a small amount of free fluid. Hypervascularity right adnexum most consistent with enlarged gonadal vein extending down to the cyst. Other: Negative for lymphadenopathy Musculoskeletal: No fracture or acute bony abnormality IMPRESSION: Moderate to high-grade small bowel obstruction, increased in dilatation of small bowel since 01/29/2016. Complex cyst right adnexa most consistent with ovarian cyst. It is unclear if this is related to the small bowel obstruction. There is a small amount of free fluid in the pelvis. Electronically Signed   By:  Franchot Gallo M.D.   On: 02/29/2016 11:27   Dg Abd 2 Views  03/01/2016  CLINICAL DATA:  Adynamic ileus. EXAM: ABDOMEN - 2 VIEW COMPARISON:  CT 02/29/2016.  Abdominal series 02/19/2016. FINDINGS: NG tube noted with tip in the stomach. Persistent dilated loops of small bowel noted. Oral contrast noted in the colon. Findings consistent with partial small bowel obstruction. No free air. Lumbar scoliosis. IMPRESSION: 1.  NG tube noted in stomach. 2. Prominently dilated loops of small bowel. Oral contrast from prior CT noted in the colon. Findings again consistent with partial small bowel obstruction. Electronically Signed   By: Marcello Moores  Register   On: 03/01/2016 08:36    Scheduled Meds: . enoxaparin (LOVENOX) injection  40 mg Subcutaneous Q24H  . pantoprazole (PROTONIX) IV  40 mg Intravenous Q24H   Continuous Infusions: . sodium chloride 100 mL/hr at 02/29/16 1817    Principal Problem:   Small bowel obstruction (HCC) Active Problems:   Tobacco abuse   Recurrent abdominal pain   Chronic anemia    Time spent: 25 min    Kelvin Cellar  [facility] Pager [phone]. If 7PM-7AM, please contact night-coverage at [email or web address], password [facility] 03/01/2016, 2:09 PM  LOS: 1 day

## 2016-03-01 NOTE — Progress Notes (Signed)
Subjective: Complains of pain, I can't tell if she is having flatus or not.  Taking ice chips.  Asking for more to drink.   Objective: Vital signs in last 24 hours: Temp:  [98.1 F (36.7 C)-99.1 F (37.3 C)] 98.1 F (36.7 C) (03/21 0513) Pulse Rate:  [58-81] 58 (03/21 0513) Resp:  [18-20] 18 (03/21 0513) BP: (102-142)/(52-80) 102/58 mmHg (03/21 0513) SpO2:  [98 %-100 %] 98 % (03/21 0513) Weight:  [70.761 kg (156 lb)] 70.761 kg (156 lb) (03/20 1755) Last BM Date: 02/19/16 NG 900 Afebrile, VSS Labs OK Film this Am show contrast in the colon  Intake/Output from previous day: 03/20 0701 - 03/21 0700 In: -  Out: 950 [Emesis/NG output:950] Intake/Output this shift:    General appearance: alert and lethargic and appear uncomfortable GI: soft, few BS, not really tender on palpation.  Lab Results:   Recent Labs  02/29/16 0854 03/01/16 0552  WBC 9.8 4.8  HGB 10.0* 8.7*  HCT 31.3* 27.6*  PLT 311 277    BMET  Recent Labs  02/29/16 0854 03/01/16 0552  NA 142 142  K 4.2 3.7  CL 108 111  CO2 25 25  GLUCOSE 114* 91  BUN 6 7  CREATININE 0.67 0.63  CALCIUM 9.0 8.5*   PT/INR No results for input(s): LABPROT, INR in the last 72 hours.   Recent Labs Lab 02/29/16 0854  AST 14*  ALT 9*  ALKPHOS 73  BILITOT 0.4  PROT 7.2  ALBUMIN 3.6     Lipase     Component Value Date/Time   LIPASE 15 02/29/2016 0854     Studies/Results: Ct Abdomen Pelvis W Contrast  02/29/2016  CLINICAL DATA:  Abdominal pain and nausea. History of small bowel obstruction. EXAM: CT ABDOMEN AND PELVIS WITH CONTRAST TECHNIQUE: Multidetector CT imaging of the abdomen and pelvis was performed using the standard protocol following bolus administration of intravenous contrast. CONTRAST:  169mL ISOVUE-300 IOPAMIDOL (ISOVUE-300) INJECTION 61% COMPARISON:  CT abdomen pelvis 01/29/2016 FINDINGS: Lower chest:  Lung bases clear without infiltrate or effusion Hepatobiliary: Normal liver.  Gallbladder  and bile ducts normal. Pancreas: Negative Spleen: Negative Adrenals/Urinary Tract: 3 cm right renal cyst unchanged. No renal mass or obstruction. No ureteral calculi. Urinary bladder normal. Stomach/Bowel: Dilated small bowel with air-fluid levels consistent with small bowel obstruction. Proximal small bowel measures approximately 4 cm in diameter. Mid small bowel measures 4.8 cm in diameter. Small bowel dilatation has progressed since the prior CT. Colon is decompressed. Terminal ileum decompressed. There is a multilocular cyst in the right pelvis measuring 6.8 x 6.6 cm. This is likely related to the right ovary and has developed since the prior study. There was a small cyst on the right ovary previously. It is unclear if this is related to the bowel obstruction. Small amount of free fluid in the pelvis likely related to cyst rupture. Vascular/Lymphatic: Aorta and IVC normal. Prominent gonadal vein bilaterally right greater than left. Hypervascular right adnexum with large gonadal vein similar to the prior study. Reproductive: Uterus mildly enlarged compatible with recent delivery. Multilocular right adnexal cyst measures 6.8 x 6.6 cm and has increased in size since the prior study. This is likely ovarian in nature. There is a small amount of free fluid. Hypervascularity right adnexum most consistent with enlarged gonadal vein extending down to the cyst. Other: Negative for lymphadenopathy Musculoskeletal: No fracture or acute bony abnormality IMPRESSION: Moderate to high-grade small bowel obstruction, increased in dilatation of small bowel since 01/29/2016. Complex cyst  right adnexa most consistent with ovarian cyst. It is unclear if this is related to the small bowel obstruction. There is a small amount of free fluid in the pelvis. Electronically Signed   By: Franchot Gallo M.D.   On: 02/29/2016 11:27   Dg Abd 2 Views  03/01/2016  CLINICAL DATA:  Adynamic ileus. EXAM: ABDOMEN - 2 VIEW COMPARISON:  CT  02/29/2016.  Abdominal series 02/19/2016. FINDINGS: NG tube noted with tip in the stomach. Persistent dilated loops of small bowel noted. Oral contrast noted in the colon. Findings consistent with partial small bowel obstruction. No free air. Lumbar scoliosis. IMPRESSION: 1.  NG tube noted in stomach. 2. Prominently dilated loops of small bowel. Oral contrast from prior CT noted in the colon. Findings again consistent with partial small bowel obstruction. Electronically Signed   By: Marcello Moores  Register   On: 03/01/2016 08:36    Medications: . enoxaparin (LOVENOX) injection  40 mg Subcutaneous Q24H  .  morphine injection  2 mg Intravenous Once  . pantoprazole (PROTONIX) IV  40 mg Intravenous Q24H    Assessment/Plan Partial SBO vs functional obstruction third admission since 02/07/16. Negative SB protocol with good movement of contrast to the colon 2/28 & 02/19/16 Possible colitis Chronic pain and narcotic use Fibroids Hx of C section/tubal ligation. Tobacco use Antibiotics: None DVT: Lovenox/SCD   Plan:  Clamp NG, and see how she does, contrast in the colon from yesterday.  This is in our opinion functional and not a mechanical issue.  We will see again as needed.    LOS: 1 day    Mamadou Breon 03/01/2016

## 2016-03-01 NOTE — Consult Note (Signed)
Referring Provider:  Dr. Coralyn Pear Primary Care Physician:  [facility] Primary Gastroenterologist:  Dr. Cristina Gong  Reason for Consultation:  Small bowel distention  HPI: [name] is a 40 y.o. female known to me from previous office evaluation for pelvic discomfort, last seen about 6 months ago. At that time, gynecologic evaluation was recommended, but she did not follow through on that as requested.  The current problem is several recent admissions in close succession because of abdominal pain and small intestinal distention, also associated with constipation and a questionable history of outpatient narcotic use. She was discharged from the hospital about 10 days ago and was out of the hospital for about a week but then had the fairly abrupt onset of pain in a new location, namely the epigastric area, as opposed to the lower abdominal region as had previously been the case.   Notably, the patient, who reportedly has been on narcotic pain medication going back as far as age 44, denies the use of outpatient narcotics prior to the current admission, and indeed her tox screen was negative for opiates at the time of this admission. She had been using Senokot to help maintain bowel movements since her last hospital discharge, and this was working quite well. However, then she skipped a day without a bowel movement and that is when the pain began. Notably, however, the patient has been requiring frequent Dilaudid injections since admission, now 1 mg every 3 hours.  On presentation to the emergency room, she underwent a CT scan that showed significant dilatation of the small bowel. Contrast did not initially pass through to the colon, but on this morning's follow-up KUB, approximately 24 hours later, there is a large amount of contrast in the midportion of the colon, although there is still at least one dilated loop of small bowel with contrast remaining in it.  The patient has been seen in  consultation by general surgery who does not feel the patient is likely to have a mechanical source for her small bowel dilatation. However, she has had previous pelvic surgery, specifically a tubal ligation and a C-section.  The patient reports that the pain that prompted this admission was a crampy pain that became increasingly frequent, to the point of being steady. She felt nauseated but did not vomit. She uses some anti-nausea medication which may have helped prevent vomiting. No associated symptoms such as fevers, diarrhea, skin rashes, joint pains, or urinary symptoms, just the transient absence of a bowel movement as noted above.   Past Medical History  Diagnosis Date  . Heart murmur   . Fibroids   . Anemia   . Low back pain   . Cervical disc disease     Past Surgical History  Procedure Laterality Date  . Cesarean section    . Tubal ligation    . Ankle surgery      Prior to Admission medications   Medication Sig Start Date End Date Taking? Authorizing Provider  Bisacodyl (DULCOLAX PO) Take 1 tablet by mouth 3 (three) times daily as needed (constipation).   Yes Historical Provider, MD  Ondansetron HCl (ZOFRAN PO) Take 1 tablet by mouth 2 (two) times daily as needed (nausea).   Yes Historical Provider, MD  pantoprazole (PROTONIX) 40 MG tablet Take 1 tablet (40 mg total) by mouth daily. 02/11/16  Yes Donne Hazel, MD  polyethylene glycol [facility] Floria Raveling) packet Take 17 g by mouth 2 (two) times daily. 02/19/16  Yes Domenic Polite, MD  senna-docusate (SENOKOT-S) 8.6-50 MG tablet Take 1 tablet by mouth 2 (two) times daily. 02/19/16  Yes Domenic Polite, MD    Current Facility-Administered Medications  Medication Dose Route Frequency Provider Last Rate Last Dose  . 0.9 %  sodium chloride infusion   Intravenous Continuous Lily Kocher, MD 100 mL/hr at 02/29/16 1817    . enoxaparin (LOVENOX) injection 40 mg  40 mg Subcutaneous Q24H Lily Kocher, MD      . ketorolac (TORADOL) 30  MG/ML injection 30 mg  30 mg Intravenous Once Kelvin Cellar, MD      . lip balm (CARMEX) ointment   Topical PRN Gardiner Barefoot, NP      . ondansetron [facility]) injection 4 mg  4 mg Intravenous Q6H PRN Lily Kocher, MD      . pantoprazole (PROTONIX) injection 40 mg  40 mg Intravenous Q24H Lily Kocher, MD   40 mg at 02/29/16 1605    Allergies as of 02/29/2016  . (No Known Allergies)    Family History  Problem Relation Age of Onset  . Diabetes Maternal Grandmother   . Hypertension Maternal Aunt   . Bowel Disease Mother     Social History   Social History  . Marital Status: Divorced    Spouse Name: N/A  . Number of Children: N/A  . Years of Education: N/A   Occupational History  . 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   Social History Main Topics  . Smoking status: Current Every Day Smoker -- 0.50 packs/day    Types: Cigarettes  . Smokeless tobacco: Never Used  . Alcohol Use: No  . Drug Use: No  . Sexual Activity: Yes     Comment: last sex 06 Jan 2016   Other Topics Concern  . Not on file   Social History Narrative    Review of Systems: Please see history of present illness  Physical Exam: Vital signs in last 24 hours: Temp:  [98.1 F (36.7 C)-99.1 F (37.3 C)] 98.1 F (36.7 C) (03/21 0513) Pulse Rate:  [58-82] 58 (03/21 0513) Resp:  [16-20] 18 (03/21 0513) BP: (102-142)/(52-80) 102/58 mmHg (03/21 0513) SpO2:  [98 %-100 %] 98 % (03/21 0513) Weight:  [70.761 kg (156 lb)] 70.761 kg (156 lb) (03/20 1755) Last BM Date: 02/19/16 General:   Alert,  Well-developed, well-nourished, pleasant and cooperative in NAD. Multiple tattoos and body piercing Head:  Normocephalic and atraumatic. Eyes:  Sclera clear, no icterus.   Conjunctiva pale. Mouth:   No ulcerations or lesions.  Oropharynx pink & moist. Neck:   No masses or thyromegaly. Lungs:  Clear throughout to auscultation.   No wheezes, crackles, or rhonchi. No evident  respiratory distress. Heart:   Regular rate and rhythm; no murmurs, clicks, rubs,  or gallops. Abdomen:  Soft, nontender, minimally tympanitic, and nondistended. No masses, hepatosplenomegaly or ventral hernias noted. Quiet bowel sounds, without bruits, guarding, or rebound.   Msk:   Symmetrical without gross deformities. Pulses:  Normal radial pulse is noted. Extremities:   Without clubbing, cyanosis, or edema. Neurologic:  Alert and coherent;  grossly normal neurologically. Skin:  Intact without significant lesions or rashes. Multiple tattoos. Cervical Nodes:  No significant cervical adenopathy. Psych:   Alert and cooperative. Normal mood and affect, but somewhat quiet.  Intake/Output from previous day: 03/20 0701 - 03/21 0700 In: -  Out: 950 [Emesis/NG output:950] Intake/Output this shift:    Lab Results:  Recent Labs  02/29/16 0854 03/01/16 0552  WBC 9.8 4.8  HGB 10.0* 8.7*  HCT 31.3* 27.6*  PLT 311 277   BMET  Recent Labs  02/29/16 0854 03/01/16 0552  NA 142 142  K 4.2 3.7  CL 108 111  CO2 25 25  GLUCOSE 114* 91  BUN 6 7  CREATININE 0.67 0.63  CALCIUM 9.0 8.5*   LFT  Recent Labs  02/29/16 0854  PROT 7.2  ALBUMIN 3.6  AST 14*  ALT 9*  ALKPHOS 73  BILITOT 0.4   PT/INR No results for input(s): LABPROT, INR in the last 72 hours.  Studies/Results: Ct Abdomen Pelvis W Contrast  02/29/2016  CLINICAL DATA:  Abdominal pain and nausea. History of small bowel obstruction. EXAM: CT ABDOMEN AND PELVIS WITH CONTRAST TECHNIQUE: Multidetector CT imaging of the abdomen and pelvis was performed using the standard protocol following bolus administration of intravenous contrast. CONTRAST:  183mL ISOVUE-300 IOPAMIDOL (ISOVUE-300) INJECTION 61% COMPARISON:  CT abdomen pelvis 01/29/2016 FINDINGS: Lower chest:  Lung bases clear without infiltrate or effusion Hepatobiliary: Normal liver.  Gallbladder and bile ducts normal. Pancreas: Negative Spleen: Negative Adrenals/Urinary  Tract: 3 cm right renal cyst unchanged. No renal mass or obstruction. No ureteral calculi. Urinary bladder normal. Stomach/Bowel: Dilated small bowel with air-fluid levels consistent with small bowel obstruction. Proximal small bowel measures approximately 4 cm in diameter. Mid small bowel measures 4.8 cm in diameter. Small bowel dilatation has progressed since the prior CT. Colon is decompressed. Terminal ileum decompressed. There is a multilocular cyst in the right pelvis measuring 6.8 x 6.6 cm. This is likely related to the right ovary and has developed since the prior study. There was a small cyst on the right ovary previously. It is unclear if this is related to the bowel obstruction. Small amount of free fluid in the pelvis likely related to cyst rupture. Vascular/Lymphatic: Aorta and IVC normal. Prominent gonadal vein bilaterally right greater than left. Hypervascular right adnexum with large gonadal vein similar to the prior study. Reproductive: Uterus mildly enlarged compatible with recent delivery. Multilocular right adnexal cyst measures 6.8 x 6.6 cm and has increased in size since the prior study. This is likely ovarian in nature. There is a small amount of free fluid. Hypervascularity right adnexum most consistent with enlarged gonadal vein extending down to the cyst. Other: Negative for lymphadenopathy Musculoskeletal: No fracture or acute bony abnormality IMPRESSION: Moderate to high-grade small bowel obstruction, increased in dilatation of small bowel since 01/29/2016. Complex cyst right adnexa most consistent with ovarian cyst. It is unclear if this is related to the small bowel obstruction. There is a small amount of free fluid in the pelvis. Electronically Signed   By: Franchot Gallo M.D.   On: 02/29/2016 11:27   Dg Abd 2 Views  03/01/2016  CLINICAL DATA:  Adynamic ileus. EXAM: ABDOMEN - 2 VIEW COMPARISON:  CT 02/29/2016.  Abdominal series 02/19/2016. FINDINGS: NG tube noted with tip in the  stomach. Persistent dilated loops of small bowel noted. Oral contrast noted in the colon. Findings consistent with partial small bowel obstruction. No free air. Lumbar scoliosis. IMPRESSION: 1.  NG tube noted in stomach. 2. Prominently dilated loops of small bowel. Oral contrast from prior CT noted in the colon. Findings again consistent with partial small bowel obstruction. Electronically Signed   By: Marcello Moores  Register   On: 03/01/2016 08:36    Impression: Unclear whether the patient's symptoms reflect a partial small bowel obstruction, or an ileus.  Plan: Agree with current  management of NG suction. Would recommend heating pad and minimization of narcotic analgesics. Have ordered follow-up films for tomorrow, hopefully can D/C NG tube at that time. Given the recurrent nature of this problem, the patient may benefit from a diagnostic laparoscopy to confirm the absence of intra-abdominal adhesions.   LOS: 1 day   Manilla Strieter V  03/01/2016, 9:54 AM   Pager [phone] If no answer or after 5 PM call [phone]

## 2016-03-01 NOTE — Progress Notes (Signed)
TELEPHONE ENCOUNTER--  Spoke w/ pt's mom by telephone--she was concerned that pt is in pain and not getting any pain medicine (apparently none currently active, per my review of orders).  I explained that we won't necessarily make the pt pain-free but we could try to gradually ratchet down her pn meds.   Spoke w/ Dr. Coralyn Pear and we have come up w/ a plan:  Low-dose Dilaudid, Toradol, and Ativan.    Called pt and explained our plan and its rationale; she seems agreeable.  Cleotis Nipper, M.D. Pager [phone] If no answer or after 5 PM call [phone]

## 2016-03-02 ENCOUNTER — Inpatient Hospital Stay (HOSPITAL_COMMUNITY): Payer: Medicaid Other

## 2016-03-02 DIAGNOSIS — R109 Unspecified abdominal pain: Secondary | ICD-10-CM

## 2016-03-02 DIAGNOSIS — K5669 Other intestinal obstruction: Secondary | ICD-10-CM

## 2016-03-02 DIAGNOSIS — Z720 Tobacco use: Secondary | ICD-10-CM

## 2016-03-02 LAB — CBC
HCT: 27 % — ABNORMAL LOW (ref 36.0–46.0)
Hemoglobin: 8.6 g/dL — ABNORMAL LOW (ref 12.0–15.0)
MCH: 28.8 pg (ref 26.0–34.0)
MCHC: 31.9 g/dL (ref 30.0–36.0)
MCV: 90.3 fL (ref 78.0–100.0)
Platelets: 245 10*3/uL (ref 150–400)
RBC: 2.99 MIL/uL — ABNORMAL LOW (ref 3.87–5.11)
RDW: 16.2 % — ABNORMAL HIGH (ref 11.5–15.5)
WBC: 5.6 10*3/uL (ref 4.0–10.5)

## 2016-03-02 LAB — BASIC METABOLIC PANEL
Anion gap: 7 (ref 5–15)
BUN: 7 mg/dL (ref 6–20)
CO2: 23 mmol/L (ref 22–32)
Calcium: 8.4 mg/dL — ABNORMAL LOW (ref 8.9–10.3)
Chloride: 109 mmol/L (ref 101–111)
Creatinine, Ser: 0.59 mg/dL (ref 0.44–1.00)
GFR calc Af Amer: >60 mL/min (ref >60)
GFR calc non Af Amer: >60 mL/min (ref >60)
Glucose, Bld: 83 mg/dL (ref 65–99)
Potassium: 3.6 mmol/L (ref 3.5–5.1)
Sodium: 139 mmol/L (ref 135–145)

## 2016-03-02 NOTE — Progress Notes (Signed)
[facility] PROGRESS NOTE  AMARILIS FEES [id number] DOB: Sep 30, 1976 DOA: 02/29/2016 PCP: [facility]  Assessment/Plan: Recurrent partial small bowel obstruction D/c NG Clr liq diet Repeat films in am; if pt and films ok, grad adv to solid diet  Outpt SBFT to help look for matted/angulated bowel  F/u at surgeons' office after SBFT to discuss laparoscopy -limit narcotics   Nausea/vomiting -resolved, patient eating  Code Status: Full code Family Communication: mother at bedside Disposition Plan:    Consultants:  GI  General surgery  Procedures:  NG tube placement on 02/29/2016  HPI/Subjective: Mrs Siedlecki is a 40 year old female having multiple previous hospitalizations for ileus/small bowel obstructions, recently admitted from 02/16/2016 through 02/19/2016 treated for ileus felt to be related to narcotic use. She was not discharged on narcotics on her last hospitalization. He presented with recurrent abdominal pain, nausea, vomiting. An NG tube was placed. Abdominal x-ray reported by radiology to reveal findings consistent with partial small bowel obstruction.  Tolerating clears currently  Objective: Filed Vitals:   03/01/16 2246 03/02/16 0436  BP: 117/74 129/59  Pulse: 51 52  Temp: 98.3 F (36.8 C) 98.5 F (36.9 C)  Resp: 18 18    Intake/Output Summary (Last 24 hours) at 03/02/16 1257 Last data filed at 03/01/16 1900  Gross per 24 hour  Intake    800 ml  Output    400 ml  Net    400 ml   Filed Weights   02/29/16 1755  Weight: 70.761 kg (156 lb)    Exam:   General:  She is awake and alert, nontoxic-appearing- little blood from left nare  Cardiovascular: Regular rate rhythm normal S1-S2 no murmurs rubs or gallops  Respiratory: Normal respiratory effort  Abdomen: Soft nondistended mild generalized tenderness to palpation  Musculoskeletal: No edema  Data Reviewed: Basic Metabolic Panel:  Recent Labs Lab 02/29/16 0854 03/01/16 0552  03/02/16 0526  NA 142 142 139  K 4.2 3.7 3.6  CL 108 111 109  CO2 25 25 23   GLUCOSE 114* 91 83  BUN 6 7 7   CREATININE 0.67 0.63 0.59  CALCIUM 9.0 8.5* 8.4*   Liver Function Tests:  Recent Labs Lab 02/29/16 0854  AST 14*  ALT 9*  ALKPHOS 73  BILITOT 0.4  PROT 7.2  ALBUMIN 3.6    Recent Labs Lab 02/29/16 0854  LIPASE 15   No results for input(s): AMMONIA in the last 168 hours. CBC:  Recent Labs Lab 02/29/16 0854 03/01/16 0552 03/02/16 0526  WBC 9.8 4.8 5.6  HGB 10.0* 8.7* 8.6*  HCT 31.3* 27.6* 27.0*  MCV 89.9 91.1 90.3  PLT 311 277 245   Cardiac Enzymes: No results for input(s): CKTOTAL, CKMB, CKMBINDEX, TROPONINI in the last 168 hours. BNP (last 3 results) No results for input(s): BNP in the last 8760 hours.  ProBNP (last 3 results) No results for input(s): PROBNP in the last 8760 hours.  CBG: No results for input(s): GLUCAP in the last 168 hours.  No results found for this or any previous visit (from the past 240 hour(s)).   Studies: Dg Abd 2 Views  03/02/2016  CLINICAL DATA:  40 year old female with small bowel obstruction versus ileus. Abdominal pain and nausea. Initial encounter. EXAM: ABDOMEN - 2 VIEW COMPARISON:  03/01/2016.  CT Abdomen and Pelvis 02/29/2016. FINDINGS: No pneumoperitoneum. Enteric tube remains in place, tip at the level of the proximal duodenum. Oral contrast administered on 02/29/2016 is now seen throughout nondilated colon to the rectum. Regressed  small bowel dilatation, residual mid abdominal small bowel loop up to 37 mm diameter. Incidental contrast filling of the appendix. Stable abdominal and pelvic visceral contours. Previously-seen excreted contrast in the urinary bladder does not persist. Stable visualized osseous structures. Thoracic scoliosis. IMPRESSION: 1. Improved bowel gas pattern. Minimal residual mid abdominal small bowel distention. No free air. 2. Enteric tube in place, tip at the level the proximal duodenum.  Electronically Signed   By: Genevie Ann M.D.   On: 03/02/2016 08:41   Dg Abd 2 Views  03/01/2016  CLINICAL DATA:  Adynamic ileus. EXAM: ABDOMEN - 2 VIEW COMPARISON:  CT 02/29/2016.  Abdominal series 02/19/2016. FINDINGS: NG tube noted with tip in the stomach. Persistent dilated loops of small bowel noted. Oral contrast noted in the colon. Findings consistent with partial small bowel obstruction. No free air. Lumbar scoliosis. IMPRESSION: 1.  NG tube noted in stomach. 2. Prominently dilated loops of small bowel. Oral contrast from prior CT noted in the colon. Findings again consistent with partial small bowel obstruction. Electronically Signed   By: Marcello Moores  Register   On: 03/01/2016 08:36    Scheduled Meds: . enoxaparin (LOVENOX) injection  40 mg Subcutaneous Q24H  . pantoprazole (PROTONIX) IV  40 mg Intravenous Q24H   Continuous Infusions: . sodium chloride 100 mL/hr at 03/01/16 1600    Principal Problem:   Small bowel obstruction (HCC) Active Problems:   Tobacco abuse   Recurrent abdominal pain   Chronic anemia    Time spent: 25 min    [facility] Pager [phone]. If 7PM-7AM, please contact night-coverage at [email or web address], password [facility] 03/02/2016, 12:57 PM  LOS: 2 days

## 2016-03-02 NOTE — Progress Notes (Signed)
Pt feels better.  Exam:  slt drowsy.  Excellent, non-obstr BS's.  Abd NT.  NG clamped (no output when hooked up earlier, per pt).  Labs stable.  Abd films:  resol of SB dil; contrast throughout colon (reviewed).  IMPR:  resol of ileus vs SBO  PLAN:  Spoke at length w/ pt's mother, pt, and Dr. Harlow Asa.  1.  D/c NG 2.  Clr liq diet 3.  Repeat films in am; if pt and films ok, grad adv to solid diet 4.  Outpt SBFT to help look for matted/angulated bowel 5.  F/u at surgeons' office after SBFT to discuss laparoscopy  [name], M.D. Pager [phone] If no answer or after 5 PM call [phone]

## 2016-03-03 ENCOUNTER — Inpatient Hospital Stay (HOSPITAL_COMMUNITY): Payer: Medicaid Other

## 2016-03-03 DIAGNOSIS — D649 Anemia, unspecified: Secondary | ICD-10-CM

## 2016-03-03 LAB — BASIC METABOLIC PANEL
Anion gap: 5 (ref 5–15)
BUN: <5 mg/dL — ABNORMAL LOW (ref 6–20)
CO2: 24 mmol/L (ref 22–32)
Calcium: 8.6 mg/dL — ABNORMAL LOW (ref 8.9–10.3)
Chloride: 109 mmol/L (ref 101–111)
Creatinine, Ser: 0.56 mg/dL (ref 0.44–1.00)
GFR calc Af Amer: >60 mL/min (ref >60)
GFR calc non Af Amer: >60 mL/min (ref >60)
Glucose, Bld: 91 mg/dL (ref 65–99)
Potassium: 3.9 mmol/L (ref 3.5–5.1)
Sodium: 138 mmol/L (ref 135–145)

## 2016-03-03 LAB — CBC
HCT: 26.6 % — ABNORMAL LOW (ref 36.0–46.0)
Hemoglobin: 8.7 g/dL — ABNORMAL LOW (ref 12.0–15.0)
MCH: 28.2 pg (ref 26.0–34.0)
MCHC: 32.7 g/dL (ref 30.0–36.0)
MCV: 86.4 fL (ref 78.0–100.0)
Platelets: 252 10*3/uL (ref 150–400)
RBC: 3.08 MIL/uL — ABNORMAL LOW (ref 3.87–5.11)
RDW: 16.1 % — ABNORMAL HIGH (ref 11.5–15.5)
WBC: 5.7 10*3/uL (ref 4.0–10.5)

## 2016-03-03 MED ORDER — LORAZEPAM 0.5 MG PO TABS
0.5000 mg | ORAL_TABLET | Freq: Four times a day (QID) | ORAL | Status: DC | PRN
  Administered 2016-03-03: 0.5 mg via ORAL
  Filled 2016-03-03: qty 1

## 2016-03-03 MED ORDER — OXYCODONE-ACETAMINOPHEN 5-325 MG PO TABS
1.0000 | ORAL_TABLET | Freq: Four times a day (QID) | ORAL | Status: DC | PRN
  Administered 2016-03-03: 2 via ORAL
  Filled 2016-03-03: qty 2

## 2016-03-03 MED ORDER — OXYCODONE-ACETAMINOPHEN 5-325 MG PO TABS: 1.0000 | ORAL_TABLET | Freq: Four times a day (QID) | ORAL | Status: DC | PRN

## 2016-03-03 MED ORDER — HYDROCODONE-ACETAMINOPHEN 5-325 MG PO TABS
1.0000 | ORAL_TABLET | Freq: Four times a day (QID) | ORAL | Status: DC | PRN
  Administered 2016-03-03: 2 via ORAL
  Filled 2016-03-03: qty 2

## 2016-03-03 NOTE — Progress Notes (Signed)
[facility] PROGRESS NOTE  [name] [id number] DOB: 27-Apr-1976 DOA: 02/29/2016 PCP: [facility]  Assessment/Plan: Recurrent partial small bowel obstruction X ray continues to improve-- advance diet per GI  Outpt SBFT to help look for matted/angulated bowel  F/u at surgeons' office after SBFT to discuss laparoscopy -change to PO narcotics   Nausea/vomiting -resolved, patient eating  Code Status: Full code Family Communication: mother at bedside 3/22 Disposition Plan:    Consultants:  GI  General surgery  Procedures:  NG tube placement on 02/29/2016  HPI/Subjective: [name] is a 40 year old female having multiple previous hospitalizations for ileus/small bowel obstructions, recently admitted from 02/16/2016 through 02/19/2016 treated for ileus felt to be related to narcotic use. She was not discharged on narcotics on her last hospitalization. He presented with recurrent abdominal pain, nausea, vomiting. An NG tube was placed. Abdominal x-ray reported by radiology to reveal findings consistent with partial small bowel obstruction.   Eating her full liquid diet  Objective: Filed Vitals:   03/03/16 0508 03/03/16 0800  BP: 127/61 123/61  Pulse: 58 143  Temp: 98.5 F (36.9 C) 98.4 F (36.9 C)  Resp: 18 20    Intake/Output Summary (Last 24 hours) at 03/03/16 1254 Last data filed at 03/02/16 1424  Gross per 24 hour  Intake    800 ml  Output      0 ml  Net    800 ml   Filed Weights   02/29/16 1755  Weight: 70.761 kg (156 lb)    Exam:   General:  She is awake and alert, nontoxic-appearing-  Cardiovascular: Regular rate rhythm normal S1-S2 no murmurs rubs or gallops  Respiratory: Normal respiratory effort  Abdomen: Soft nondistended mild generalized tenderness to palpation  Musculoskeletal: No edema  Data Reviewed: Basic Metabolic Panel:  Recent Labs Lab 02/29/16 0854 03/01/16 0552 03/02/16 0526 03/03/16 0519  NA 142 142 139  138  K 4.2 3.7 3.6 3.9  CL 108 111 109 109  CO2 25 25 23 24   GLUCOSE 114* 91 83 91  BUN 6 7 7  <5*  CREATININE 0.67 0.63 0.59 0.56  CALCIUM 9.0 8.5* 8.4* 8.6*   Liver Function Tests:  Recent Labs Lab 02/29/16 0854  AST 14*  ALT 9*  ALKPHOS 73  BILITOT 0.4  PROT 7.2  ALBUMIN 3.6    Recent Labs Lab 02/29/16 0854  LIPASE 15   No results for input(s): AMMONIA in the last 168 hours. CBC:  Recent Labs Lab 02/29/16 0854 03/01/16 0552 03/02/16 0526 03/03/16 0519  WBC 9.8 4.8 5.6 5.7  HGB 10.0* 8.7* 8.6* 8.7*  HCT 31.3* 27.6* 27.0* 26.6*  MCV 89.9 91.1 90.3 86.4  PLT 311 277 245 252   Cardiac Enzymes: No results for input(s): CKTOTAL, CKMB, CKMBINDEX, TROPONINI in the last 168 hours. BNP (last 3 results) No results for input(s): BNP in the last 8760 hours.  ProBNP (last 3 results) No results for input(s): PROBNP in the last 8760 hours.  CBG: No results for input(s): GLUCAP in the last 168 hours.  No results found for this or any previous visit (from the past 240 hour(s)).   Studies: Dg Abd 2 Views  03/03/2016  CLINICAL DATA:  Ileus EXAM: ABDOMEN - 2 VIEW COMPARISON:  Abdominal radiographs from 1 day prior FINDINGS: There are a few residual mildly dilated small bowel loops in the central abdomen, which appear decreased. Retained oral contrast is seen throughout the colon, which is normal caliber. No evidence of pneumatosis or  pneumoperitoneum. IMPRESSION: A few mildly dilated small bowel loops remain in the central abdomen, decreased, in keeping with improving ileus. Electronically Signed   By: Ilona Sorrel M.D.   On: 03/03/2016 09:48   Dg Abd 2 Views  03/02/2016  CLINICAL DATA:  40 year old female with small bowel obstruction versus ileus. Abdominal pain and nausea. Initial encounter. EXAM: ABDOMEN - 2 VIEW COMPARISON:  03/01/2016.  CT Abdomen and Pelvis 02/29/2016. FINDINGS: No pneumoperitoneum. Enteric tube remains in place, tip at the level of the proximal  duodenum. Oral contrast administered on 02/29/2016 is now seen throughout nondilated colon to the rectum. Regressed small bowel dilatation, residual mid abdominal small bowel loop up to 37 mm diameter. Incidental contrast filling of the appendix. Stable abdominal and pelvic visceral contours. Previously-seen excreted contrast in the urinary bladder does not persist. Stable visualized osseous structures. Thoracic scoliosis. IMPRESSION: 1. Improved bowel gas pattern. Minimal residual mid abdominal small bowel distention. No free air. 2. Enteric tube in place, tip at the level the proximal duodenum. Electronically Signed   By: Genevie Ann M.D.   On: 03/02/2016 08:41    Scheduled Meds: . enoxaparin (LOVENOX) injection  40 mg Subcutaneous Q24H  . pantoprazole (PROTONIX) IV  40 mg Intravenous Q24H   Continuous Infusions:    Principal Problem:   Small bowel obstruction (HCC) Active Problems:   Tobacco abuse   Recurrent abdominal pain   Chronic anemia    Time spent: 25 min    [facility] Pager [phone]. If 7PM-7AM, please contact night-coverage at [email or web address], password [facility] 03/03/2016, 12:54 PM  LOS: 3 days

## 2016-03-03 NOTE — Progress Notes (Signed)
Pt currently off floor for abd films.   Per RN, apparently she is doing somewhat better--feels ready to have her diet advanced.  Still getting Dilaudid in low doses (0.5 mg) every 6 hrs.  RECOMM:  1.  Await abd film results--if they do not show recurr obstrn, I think it is time to transition to oral pn meds in anticipation of dischg in the next 1-2 days  2.  Per pt preference, have advanced diet.   I have ordered full liq; hopefully will be able to advance to regular tomorrow.  3.  Call me prn to discuss.  [name], M.D. Pager [phone] If no answer or after 5 PM call [phone]

## 2016-03-03 NOTE — Discharge Summary (Signed)
Physician Discharge Summary  [name] [id number] DOB: 02/08/1976 DOA: 02/29/2016  PCP: [facility]  Admit date: 02/29/2016 Discharge date: 03/03/2016  Time spent: 35 minutes  Recommendations for Outpatient Follow-up:  1. Outpatient small bowel FT- ordered under ambulatory orders for next Wednesday 2. Stop smoking 3. Bowel regimen for daily BMs   Discharge Diagnoses:  Principal Problem:   Small bowel obstruction (HCC) Active Problems:   Tobacco abuse   Recurrent abdominal pain   Chronic anemia   Discharge Condition: improved  Diet recommendation: low residue  Filed Weights   02/29/16 1755  Weight: 70.761 kg (156 lb)    History of present illness:  [name] is a 40 y.o. woman with a recent history of constipation, ileus, and colitis who presents for the 5th time in the past 2 months for evaluation of recurrent abdominal pain with nausea and vomiting. CT scan suggests high grade small bowel obstruction, though the patient reports that she has had a small amount of stool output since being in the ED. 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 The patient insists that she dramatically reduced her narcotic use (though she has needed IV narcotics for pain control during her hospital admissions), but the symptoms are still recurring. She has been followed by general surgery during her previous admissions, but she has never had to go to the OR. She has typically improved with bowel rest, IV fluids, and NG tube for decompression. The patient and her family insist that she was still symptomatic at her last discharge.  She reports that she is currently using tramadol for pain and zofran for nausea. She has had less vomiting since her most recent discharge on 3/10 because she has been  using the Zofran. No documented fever. She has had light-headedness but no LOC. She has chest pain and shortness of breath that she relates to the severity of her abdominal pain. She has not had any evidence of blood in her urine or stool. She has actually been back to work and tolerating a regular diet, despite her abdominal pain. Of note, outpatient GI referral was suggested at her previous discharge, but she could not get an appointment until May.  Hospitalist asked to admit. NG tube has been placed by the ED physician. General surgery consult has been requested from the ED. Family is requesting [facility] consult as well.  Hospital Course:  Patient asking to go home  Recurrent partial small bowel obstruction X ray continues to improve-- advance diet per GI to low residue Outpt SBFT to help look for matted/angulated bowel- ordered for Wednesday F/u at surgeons' office after SBFT to discuss laparoscopy -change to PO narcotics  Nausea/vomiting -resolved, patient eating   Procedures:    Consultations:  GI  surgery  Discharge Exam: Filed Vitals:   03/03/16 0800 03/03/16 1300  BP: 123/61 122/62  Pulse: 143 61  Temp: 98.4 F (36.9 C) 98.6 F (37 C)  Resp: 20 18     Discharge Instructions   Discharge Instructions    Discharge instructions    Complete by:  As directed   Outpatient SBFT Low residue diet     Increase activity slowly    Complete by:  As directed           Current Discharge Medication List  START taking these medications   Details  oxyCODONE-acetaminophen (PERCOCET/ROXICET) 5-325 MG tablet Take 1-2 tablets by mouth every 6 (six) hours as needed for severe pain. Qty: 15 tablet, Refills: 0      CONTINUE these medications which have NOT CHANGED   Details  Ondansetron HCl (ZOFRAN PO) Take 1 tablet by mouth 2 (two) times daily as needed (nausea).    pantoprazole (PROTONIX) 40 MG tablet Take 1 tablet (40 mg total) by mouth daily. Qty: 30  tablet, Refills: 0    polyethylene glycol (MIRALAX / GLYCOLAX) packet Take 17 g by mouth 2 (two) times daily. Qty: 28 each, Refills: 0    senna-docusate (SENOKOT-S) 8.6-50 MG tablet Take 1 tablet by mouth 2 (two) times daily. Qty: 30 tablet, Refills: 0      STOP taking these medications     Bisacodyl (DULCOLAX PO)        No Known Allergies Follow-up Information    Follow up with [facility] In 1 week.   Specialty:  Internal Medicine   Contact information:   [address] [place] [address] [place] [address] [phone]        The results of significant diagnostics from this hospitalization (including imaging, microbiology, ancillary and laboratory) are listed below for reference.    Significant Diagnostic Studies: Dg Chest 2 View  02/09/2016  CLINICAL DATA:  Fever. EXAM: CHEST  2 VIEW COMPARISON:  February 07, 2016. FINDINGS: The heart size and mediastinal contours are within normal limits. Both lungs are clear. Nasogastric tube tip is seen in proximal stomach. No pneumothorax or pleural effusion is noted. Mild dextroscoliosis of lower thoracic spine is noted. IMPRESSION: No active cardiopulmonary disease. Electronically Signed   By: Marijo Conception, M.D.   On: 02/09/2016 15:22   Dg Abd 1 View  02/18/2016  CLINICAL DATA:  Nausea, vomiting and abdominal cramping. EXAM: ABDOMEN - 1 VIEW COMPARISON:  02/16/2016 FINDINGS: No significant bowel dilation is seen. Normal bowel gas pattern. No significant increase in stool. Soft tissues and skeletal structures are unremarkable. IMPRESSION: 1. Normal supine abdomen radiograph. The small bowel obstruction suggests on the prior study is no longer evident. Electronically Signed   By: Lajean Manes M.D.   On: 02/18/2016 10:04   Dg Abd 1 View  02/09/2016  CLINICAL DATA:  History of small bowel obstruction. Assess NG tube position. EXAM: ABDOMEN - 1 VIEW COMPARISON:  02/09/2016 at 4:28 a.m. and 02/07/2016, 02/05/2016 FINDINGS: NG tube is not  significantly changed with tip over the stomach in the left upper quadrant and side-port in the expected region of the gastroesophageal junction. Contrast and air are present throughout the colon. There is an air-filled loop of dilated small bowel and left mid abdomen measuring 3.5 cm in diameter slightly improved. Less dilated small bowel loops compared to 02/07/2016. Remainder of the exam is unchanged. IMPRESSION: Single air-filled dilated small bowel loop in the left mid abdomen with air and contrast throughout the colon compatible with resolving small bowel obstruction. Nasogastric tube unchanged with tip over the stomach in the left upper quadrant and side-port in the expected region of the gastroesophageal junction. Electronically Signed   By: Marin Olp M.D.   On: 02/09/2016 15:33   Ct Abdomen Pelvis W Contrast  02/29/2016  CLINICAL DATA:  Abdominal pain and nausea. History of small bowel obstruction. EXAM: CT ABDOMEN AND PELVIS WITH CONTRAST TECHNIQUE: Multidetector CT imaging of the abdomen and pelvis was performed using the standard protocol following bolus administration of intravenous contrast.  CONTRAST:  1100mL ISOVUE-300 IOPAMIDOL (ISOVUE-300) INJECTION 61% COMPARISON:  CT abdomen pelvis 01/29/2016 FINDINGS: Lower chest:  Lung bases clear without infiltrate or effusion Hepatobiliary: Normal liver.  Gallbladder and bile ducts normal. Pancreas: Negative Spleen: Negative Adrenals/Urinary Tract: 3 cm right renal cyst unchanged. No renal mass or obstruction. No ureteral calculi. Urinary bladder normal. Stomach/Bowel: Dilated small bowel with air-fluid levels consistent with small bowel obstruction. Proximal small bowel measures approximately 4 cm in diameter. Mid small bowel measures 4.8 cm in diameter. Small bowel dilatation has progressed since the prior CT. Colon is decompressed. Terminal ileum decompressed. There is a multilocular cyst in the right pelvis measuring 6.8 x 6.6 cm. This is likely  related to the right ovary and has developed since the prior study. There was a small cyst on the right ovary previously. It is unclear if this is related to the bowel obstruction. Small amount of free fluid in the pelvis likely related to cyst rupture. Vascular/Lymphatic: Aorta and IVC normal. Prominent gonadal vein bilaterally right greater than left. Hypervascular right adnexum with large gonadal vein similar to the prior study. Reproductive: Uterus mildly enlarged compatible with recent delivery. Multilocular right adnexal cyst measures 6.8 x 6.6 cm and has increased in size since the prior study. This is likely ovarian in nature. There is a small amount of free fluid. Hypervascularity right adnexum most consistent with enlarged gonadal vein extending down to the cyst. Other: Negative for lymphadenopathy Musculoskeletal: No fracture or acute bony abnormality IMPRESSION: Moderate to high-grade small bowel obstruction, increased in dilatation of small bowel since 01/29/2016. Complex cyst right adnexa most consistent with ovarian cyst. It is unclear if this is related to the small bowel obstruction. There is a small amount of free fluid in the pelvis. Electronically Signed   By: Franchot Gallo M.D.   On: 02/29/2016 11:27   Dg Abd 2 Views  03/03/2016  CLINICAL DATA:  Ileus EXAM: ABDOMEN - 2 VIEW COMPARISON:  Abdominal radiographs from 1 day prior FINDINGS: There are a few residual mildly dilated small bowel loops in the central abdomen, which appear decreased. Retained oral contrast is seen throughout the colon, which is normal caliber. No evidence of pneumatosis or pneumoperitoneum. IMPRESSION: A few mildly dilated small bowel loops remain in the central abdomen, decreased, in keeping with improving ileus. Electronically Signed   By: Ilona Sorrel M.D.   On: 03/03/2016 09:48   Dg Abd 2 Views  03/02/2016  CLINICAL DATA:  40 year old female with small bowel obstruction versus ileus. Abdominal pain and nausea.  Initial encounter. EXAM: ABDOMEN - 2 VIEW COMPARISON:  03/01/2016.  CT Abdomen and Pelvis 02/29/2016. FINDINGS: No pneumoperitoneum. Enteric tube remains in place, tip at the level of the proximal duodenum. Oral contrast administered on 02/29/2016 is now seen throughout nondilated colon to the rectum. Regressed small bowel dilatation, residual mid abdominal small bowel loop up to 37 mm diameter. Incidental contrast filling of the appendix. Stable abdominal and pelvic visceral contours. Previously-seen excreted contrast in the urinary bladder does not persist. Stable visualized osseous structures. Thoracic scoliosis. IMPRESSION: 1. Improved bowel gas pattern. Minimal residual mid abdominal small bowel distention. No free air. 2. Enteric tube in place, tip at the level the proximal duodenum. Electronically Signed   By: Genevie Ann M.D.   On: 03/02/2016 08:41   Dg Abd 2 Views  03/01/2016  CLINICAL DATA:  Adynamic ileus. EXAM: ABDOMEN - 2 VIEW COMPARISON:  CT 02/29/2016.  Abdominal series 02/19/2016. FINDINGS: NG tube noted with tip  in the stomach. Persistent dilated loops of small bowel noted. Oral contrast noted in the colon. Findings consistent with partial small bowel obstruction. No free air. Lumbar scoliosis. IMPRESSION: 1.  NG tube noted in stomach. 2. Prominently dilated loops of small bowel. Oral contrast from prior CT noted in the colon. Findings again consistent with partial small bowel obstruction. Electronically Signed   By: Marcello Moores  Register   On: 03/01/2016 08:36   Dg Abd 2 Views  02/05/2016  CLINICAL DATA:  Follow-up small bowel obstruction EXAM: ABDOMEN - 2 VIEW COMPARISON:  Supine and upright abdominal films of February 04, 2016 FINDINGS: There remain loops of fluid and gas-filled small bowel centered in the mid and left aspect of the abdomen. Contrast and gas are present within the ascending and proximal transverse colons as well as the rectosigmoid. No free extraluminal gas collections are observed.  The bony structures exhibit no acute abnormality. There is stable curvature of the thoracic and upper lumbar spine with convexity toward the right. IMPRESSION: Persistent relatively high-grade mid to distal small bowel obstruction. Previously DIS administered oral contrast has transited more of the colon. There is no evidence of obstruction. Electronically Signed   By: David  Martinique M.D.   On: 02/05/2016 07:22   Dg Abd 2 Views  02/04/2016  CLINICAL DATA:  Small bowel obstruction. Possible surgery this morning. EXAM: ABDOMEN - 2 VIEW COMPARISON:  02/03/2016 FINDINGS: Unchanged bowel gas pattern with small more than large bowel distention. Small bowel distention is improved compared to yesterday. Oral contrast still seen in the proximal colon, with interval emptying of the distal colon and rectum. No concerning intra-abdominal mass effect or calcification. No pneumoperitoneum. Lung bases are clear. IMPRESSION: Partial small bowel obstruction pattern. Small bowel distention is improved from yesterday. Electronically Signed   By: Monte Fantasia M.D.   On: 02/04/2016 08:05   Dg Abd 2 Views  02/03/2016  CLINICAL DATA:  Follow-up small bowel obstruction EXAM: ABDOMEN - 2 VIEW COMPARISON:  02/02/2016 FINDINGS: There is slight worsening of small bowel distension with air-fluid levels in upper abdomen consistent with partial small bowel obstruction. Again noted contrast material throughout the colon. NG tube has been removed. There is no evidence of free abdominal air. IMPRESSION: There is slight worsening small bowel distension with air-fluid levels in mid and upper abdomen consistent with at least partial small bowel obstruction. Again noted contrast material throughout the colon. NG tube has been removed. Electronically Signed   By: Lahoma Crocker M.D.   On: 02/03/2016 08:45   Dg Abd Acute W/chest  02/16/2016  CLINICAL DATA:  Abdominal pain and constipation. Previous history of bowel obstruction. EXAM: DG ABDOMEN  ACUTE W/ 1V CHEST COMPARISON:  02/09/2016 FINDINGS: A few mildly dilated small bowel loops containing air-fluid levels are seen within the left lower quadrant. There is also gas and stool throughout the colon. No evidence of free air. Left pelvic phlebolith noted. Heart size and mediastinal contours are normal. Both lungs are clear. Mild thoracolumbar dextroscoliosis noted. IMPRESSION: Low-grade/partial small bowel obstruction versus ileus. Continued radiographic followup recommended. No active cardiopulmonary disease. Electronically Signed   By: Earle Gell M.D.   On: 02/16/2016 16:37   Dg Abd Acute W/chest  02/07/2016  CLINICAL DATA:  Acute onset of lower abdominal pain, nausea and vomiting. Initial encounter. EXAM: DG ABDOMEN ACUTE W/ 1V CHEST COMPARISON:  Abdominal radiograph performed 02/05/2016, and chest radiograph performed 01/09/2016 FINDINGS: The lungs are well-aerated and clear. There is no evidence of focal opacification,  pleural effusion or pneumothorax. The cardiomediastinal silhouette is within normal limits. Bilateral nipple shadows are noted. The visualized bowel gas pattern is unremarkable. Scattered fluid and air are seen within the small and large bowel; there is no evidence of small bowel dilatation to suggest obstruction. No free intra-abdominal air is identified on the provided upright view. No acute osseous abnormalities are seen; the sacroiliac joints are unremarkable in appearance. Foreign densities overlying the mid pelvis are thought to be associated with the patient's pants. IMPRESSION: 1. Scattered fluid-filled loops of small and large bowel noted within the abdomen, raising question for mild dysmotility. No free intra-abdominal air seen. 2. No acute cardiopulmonary process identified. Electronically Signed   By: Garald Balding M.D.   On: 02/07/2016 23:33   Dg Abd Portable 1v-small Bowel Obstruction Protocol-initial, 8 Hr Delay  02/19/2016  CLINICAL DATA:  Follow-up small bowel  obstruction. 8 hour delayed film. EXAM: PORTABLE ABDOMEN - 1 VIEW COMPARISON:  02/18/2016 FINDINGS: Oral contrast material was apparently given a 8 hours ago. Contrast material is demonstrated throughout the colon and in the rectum as well as in the few distal small bowel loops. This suggest no evidence of complete obstruction. Visualized small and large bowel are not abnormally distended. IMPRESSION: Contrast material demonstrated throughout the colon suggesting no evidence of obstruction. Electronically Signed   By: Lucienne Capers M.D.   On: 02/19/2016 01:59   Dg Abd Portable 1v-small Bowel Obstruction Protocol-initial, 8 Hr Delay  02/09/2016  CLINICAL DATA:  Assess progression of contrast through the bowel. Initial encounter. EXAM: PORTABLE ABDOMEN - 1 VIEW COMPARISON:  Abdominal radiograph performed 02/07/2016 FINDINGS: 9 hours status post administration of oral contrast, contrast has progressed to the colon. There is no evidence for bowel obstruction. The finding on the prior radiograph again likely reflects mild bowel dysmotility. Distended loops of small bowel measure up to 4.0 cm in diameter. No free intra-abdominal air is seen, though evaluation for free air is limited on a single supine view. The appendix is filled with contrast. No acute osseous abnormalities are identified. IMPRESSION: Contrast has progressed to the colon. No evidence for bowel obstruction. Distended loops of small bowel again noted, likely reflecting mild bowel dysmotility. Electronically Signed   By: Garald Balding M.D.   On: 02/09/2016 04:38    Microbiology: No results found for this or any previous visit (from the past 240 hour(s)).   Labs: Basic Metabolic Panel:  Recent Labs Lab 02/29/16 0854 03/01/16 0552 03/02/16 0526 03/03/16 0519  NA 142 142 139 138  K 4.2 3.7 3.6 3.9  CL 108 111 109 109  CO2 25 25 23 24   GLUCOSE 114* 91 83 91  BUN 6 7 7  <5*  CREATININE 0.67 0.63 0.59 0.56  CALCIUM 9.0 8.5* 8.4* 8.6*    Liver Function Tests:  Recent Labs Lab 02/29/16 0854  AST 14*  ALT 9*  ALKPHOS 73  BILITOT 0.4  PROT 7.2  ALBUMIN 3.6    Recent Labs Lab 02/29/16 0854  LIPASE 15   No results for input(s): AMMONIA in the last 168 hours. CBC:  Recent Labs Lab 02/29/16 0854 03/01/16 0552 03/02/16 0526 03/03/16 0519  WBC 9.8 4.8 5.6 5.7  HGB 10.0* 8.7* 8.6* 8.7*  HCT 31.3* 27.6* 27.0* 26.6*  MCV 89.9 91.1 90.3 86.4  PLT 311 277 245 252   Cardiac Enzymes: No results for input(s): CKTOTAL, CKMB, CKMBINDEX, TROPONINI in the last 168 hours. BNP: BNP (last 3 results) No results for input(s): BNP in  the last 8760 hours.  ProBNP (last 3 results) No results for input(s): PROBNP in the last 8760 hours.  CBG: No results for input(s): GLUCAP in the last 168 hours.     Signed:  Geradine Girt DO.  [facility] 03/03/2016, 3:44 PM

## 2016-03-03 NOTE — Progress Notes (Signed)
Pt feeling somewhat better.  Still using pain med for occasion sharp pains (cramps).  Pt has had several small loose BM's today.  KUB from today shows no signif obstrn, resid contrast still present throughout colon.  Abd nondistended, soft, nontender, nl BS's.  Pt feels ready to go home w/out dietary progression (eg, having a couple of solid meals prior to dischg).  No objection to dischg at this time although ordinarily I would prefer to observe the pt on her anticipated home regimen (oral pn meds, solid food) for a day or so before discharging.  I did advise the pt to avoid fiber/peels/hulls (ie., low residue diet) and frequent small meals.  Recomm SBFT next week and outpt surg consultation shortly thereafter.  Hopefully we can get pt laparascoped before her next obstructive episode.  Will sign off; call me if questions.  Cleotis Nipper, M.D. Pager [phone] If no answer or after 5 PM call [phone]

## 2016-03-03 NOTE — Progress Notes (Signed)
Discharge instructions given to pt, verbalized understanding. Left the unit in stable condition. 

## 2016-03-18 ENCOUNTER — Other Ambulatory Visit: Payer: Self-pay | Admitting: Physician Assistant

## 2016-03-18 DIAGNOSIS — K56609 Unspecified intestinal obstruction, unspecified as to partial versus complete obstruction: Secondary | ICD-10-CM

## 2016-03-18 DIAGNOSIS — R1084 Generalized abdominal pain: Secondary | ICD-10-CM

## 2016-03-22 ENCOUNTER — Ambulatory Visit
Admission: RE | Admit: 2016-03-22 | Discharge: 2016-03-22 | Disposition: A | Discharge status: Home / Self Care | Payer: Medicaid Other | Source: Ambulatory Visit | Attending: Physician Assistant | Admitting: Physician Assistant

## 2016-03-22 DIAGNOSIS — K56609 Unspecified intestinal obstruction, unspecified as to partial versus complete obstruction: Secondary | ICD-10-CM

## 2016-03-22 DIAGNOSIS — R1084 Generalized abdominal pain: Secondary | ICD-10-CM

## 2016-04-13 ENCOUNTER — Ambulatory Visit: Payer: Medicaid Other | Admitting: Gastroenterology

## 2016-04-26 ENCOUNTER — Other Ambulatory Visit: Payer: Self-pay | Admitting: General Surgery

## 2016-04-26 DIAGNOSIS — K56609 Unspecified intestinal obstruction, unspecified as to partial versus complete obstruction: Secondary | ICD-10-CM

## 2016-04-29 ENCOUNTER — Other Ambulatory Visit: Payer: Medicaid Other

## 2016-05-11 ENCOUNTER — Ambulatory Visit
Admission: RE | Admit: 2016-05-11 | Discharge: 2016-05-11 | Disposition: A | Discharge status: Home / Self Care | Payer: Medicaid Other | Source: Ambulatory Visit | Attending: General Surgery | Admitting: General Surgery

## 2016-05-11 DIAGNOSIS — K56609 Unspecified intestinal obstruction, unspecified as to partial versus complete obstruction: Secondary | ICD-10-CM

## 2016-09-23 IMAGING — DX DG ABDOMEN 2V
2 series · 2 of 2 positions shown · non-contrast
Comparison: 01/10/2016

CLINICAL DATA: Low abdominal pain and constipation for the last
week.

EXAM:
ABDOMEN - 2 VIEW

[w abdomen upright]
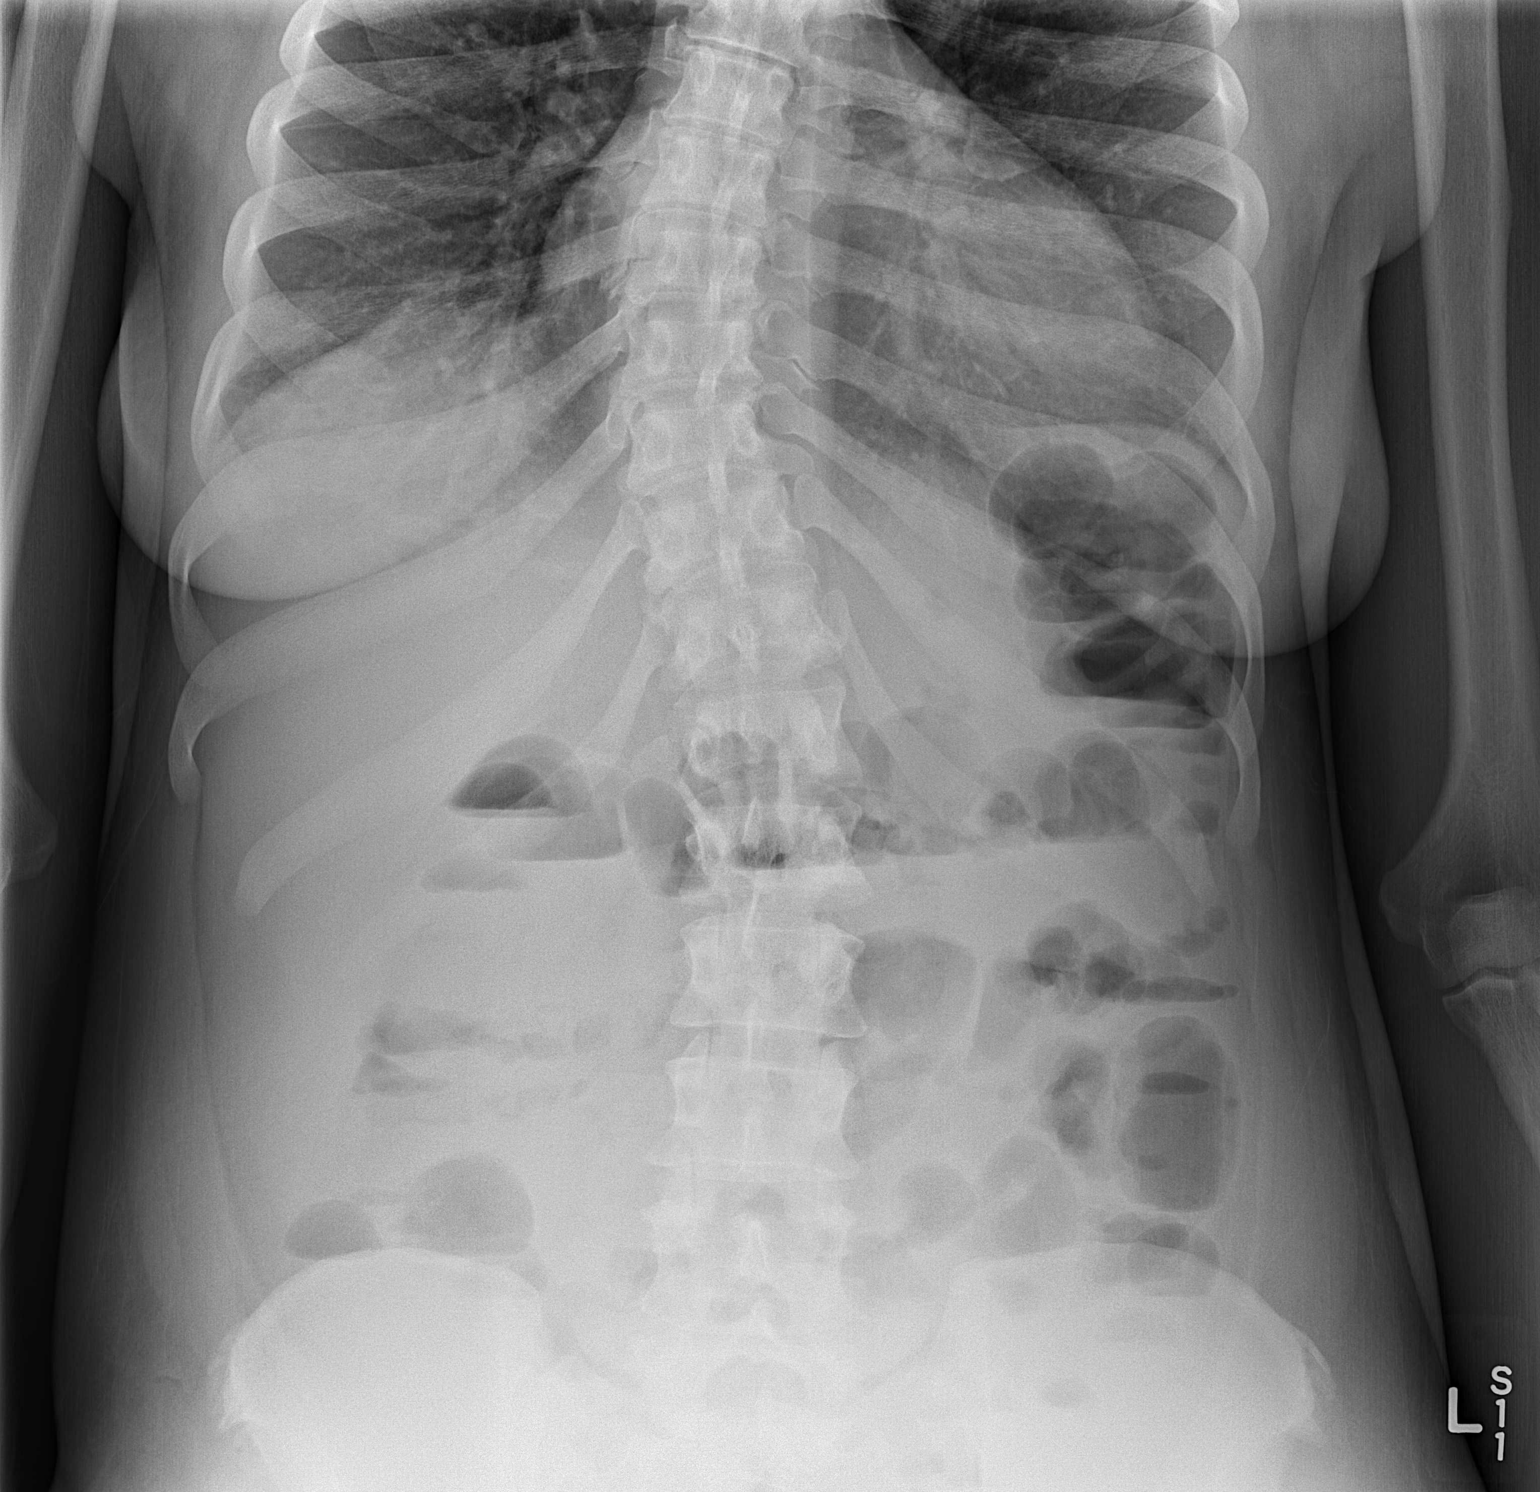

[t abdomen supine]
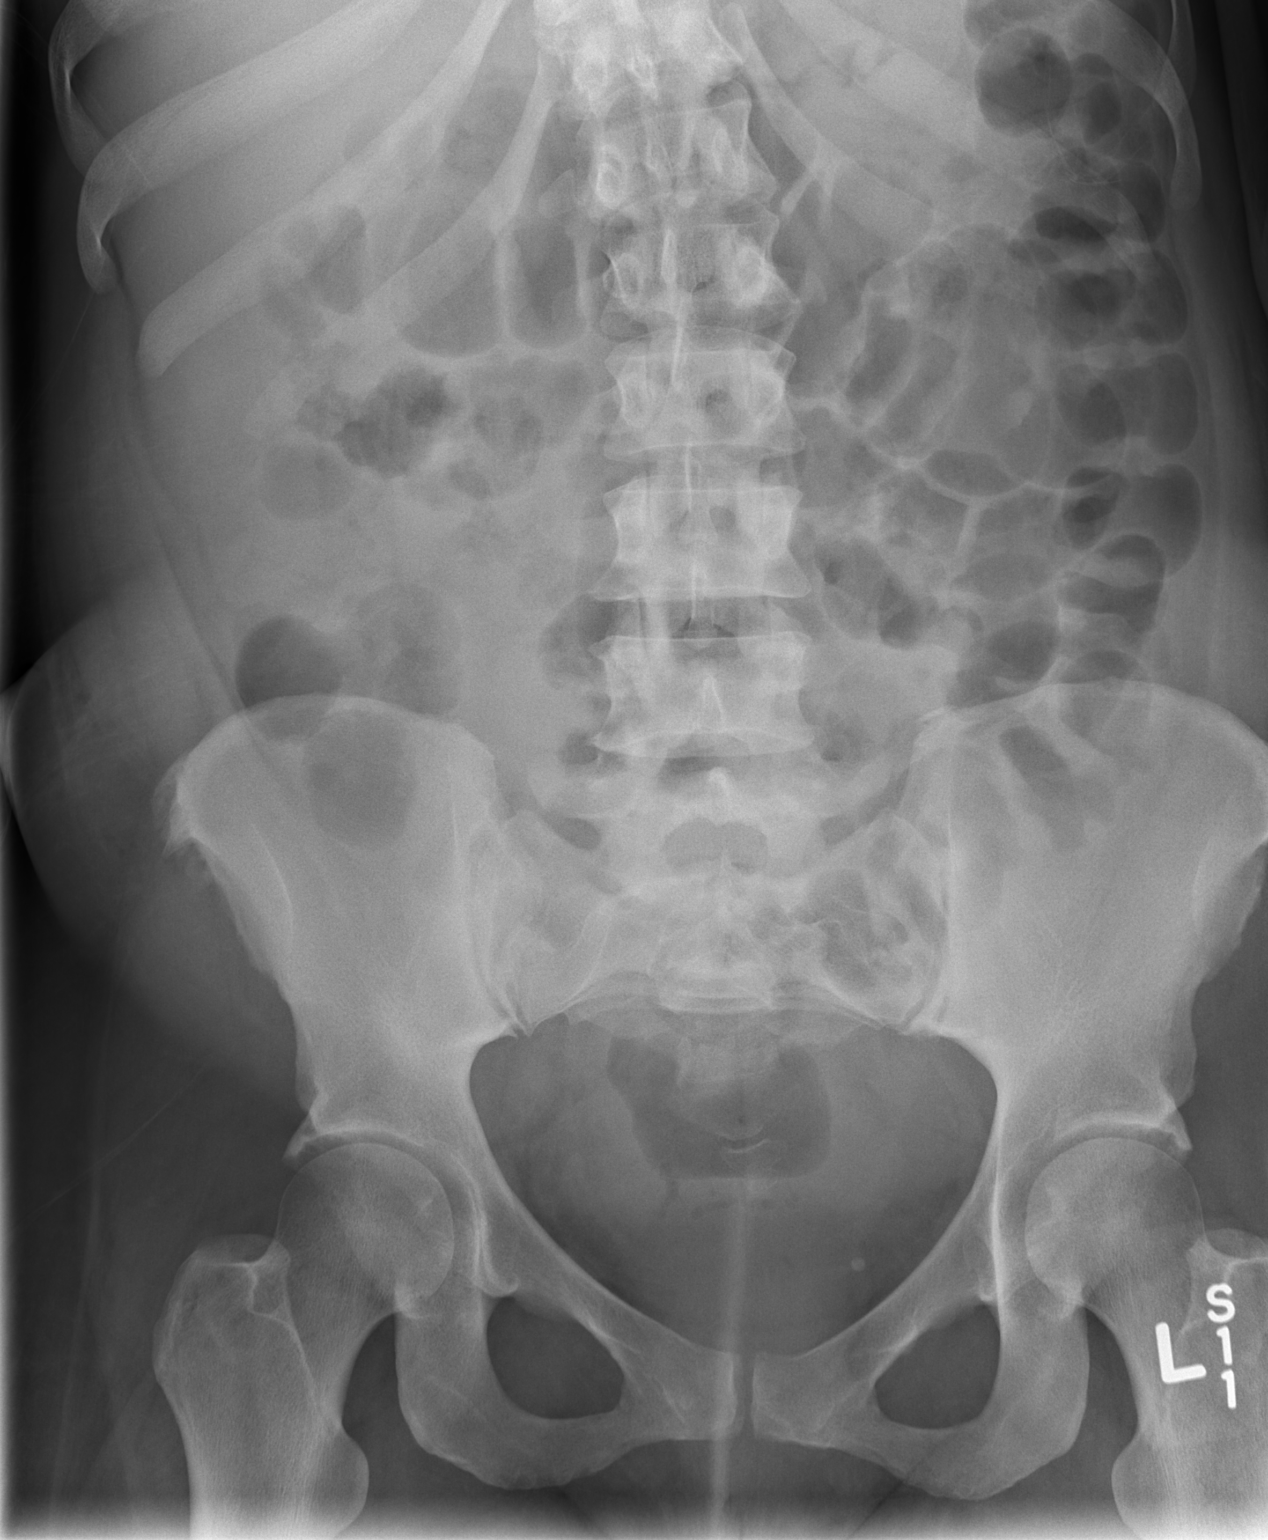

[2 of 2 positions shown; findings below may reference images not displayed]

FINDINGS: The bowel gas pattern is nonobstructive. Multiple gas fluid levels
are seen within non pathologically distended small and large bowel
loops. No radiographic evidence of organomegaly. There is no
evidence of free air. No radio-opaque calculi or other significant
radiographic abnormality is seen.
IMPRESSION: Multiple gas fluid levels within non pathologically distended small
and large bowel loops. Findings are suggestive of enteritis/colitis.

## 2016-10-10 IMAGING — CR DG ABDOMEN 2V
2 series · 2 of 2 positions shown · non-contrast
Comparison: Abdominal radiographs January 12, 2016

CLINICAL DATA: Lower abdominal pain, nausea, vomiting and diarrhea
for 2 days.

EXAM:
ABDOMEN - 2 VIEW

[w abdomen upright]
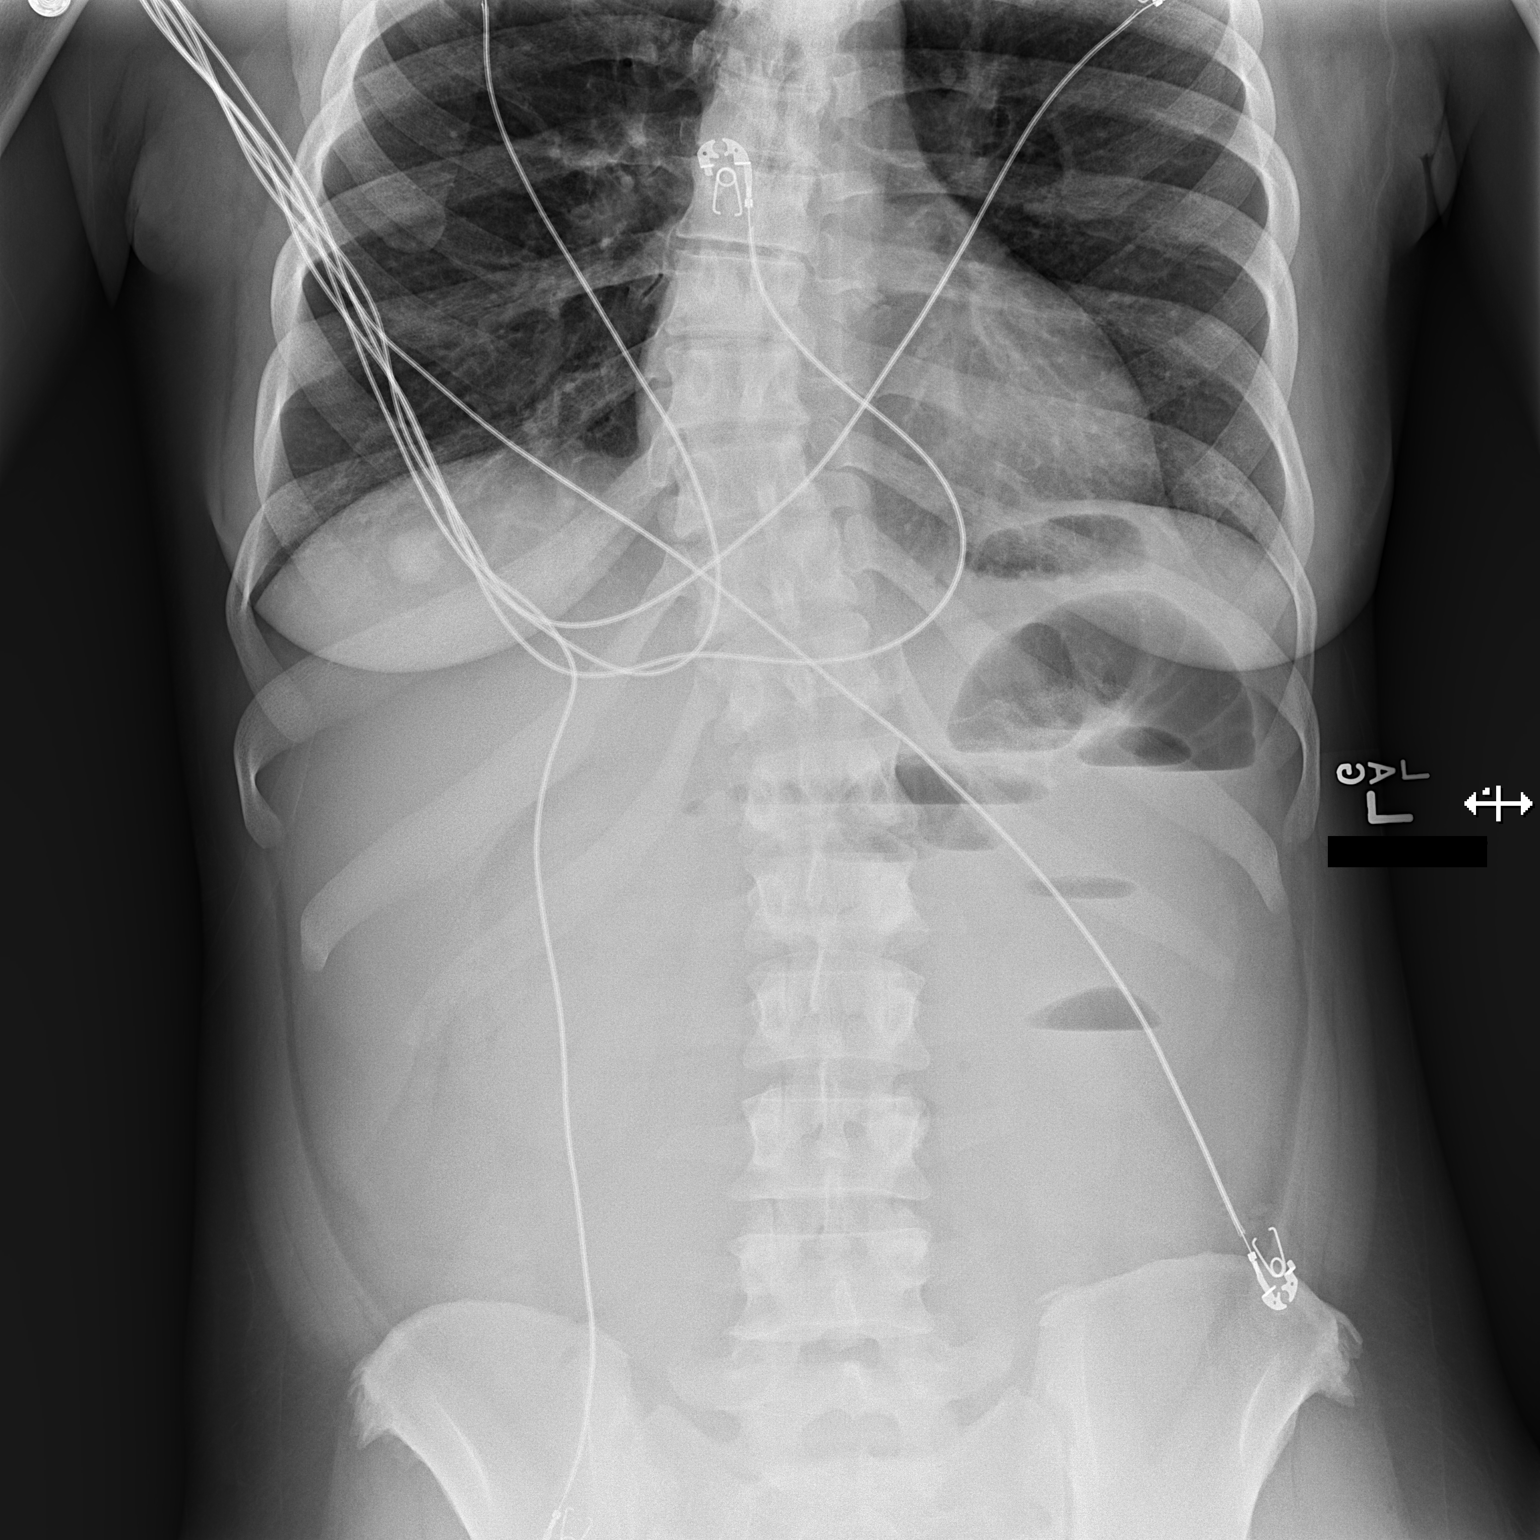

[t abdomen supine]
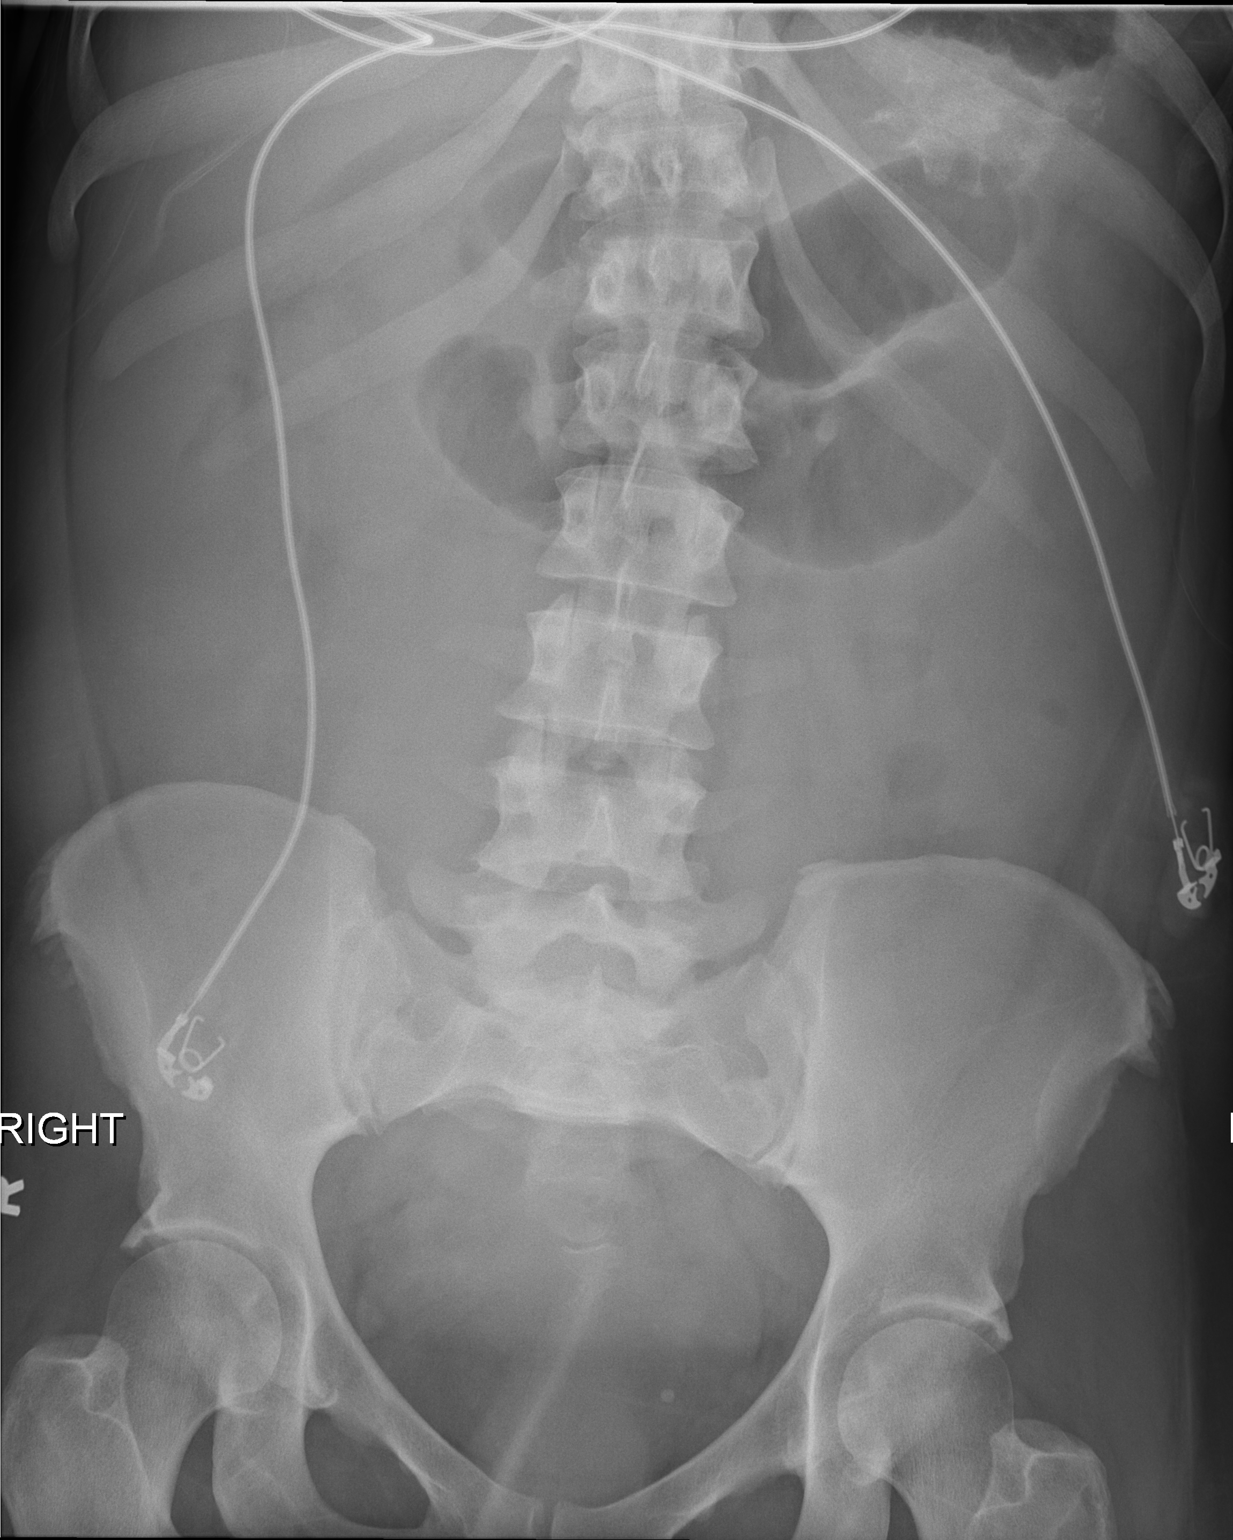

[2 of 2 positions shown; findings below may reference images not displayed]

FINDINGS: Scattered mildly gas distended small bowel air-fluid levels, paucity
of large bowel gas. No intra-abdominal mass effect or pathologic
calcification. Phleboliths LEFT pelvis. Lung bases are clear. Lower
thoracic dextroscoliosis.
IMPRESSION: Small bowel air-fluid levels, paucity of large bowel gas concerning
for small bowel obstruction.

## 2016-10-30 IMAGING — CR DG ABDOMEN 1V
1 series · 1 of 1 positions shown · non-contrast
Comparison: 02/16/2016

CLINICAL DATA: Nausea, vomiting and abdominal cramping.

EXAM:
ABDOMEN - 1 VIEW

[t abdomen supine]
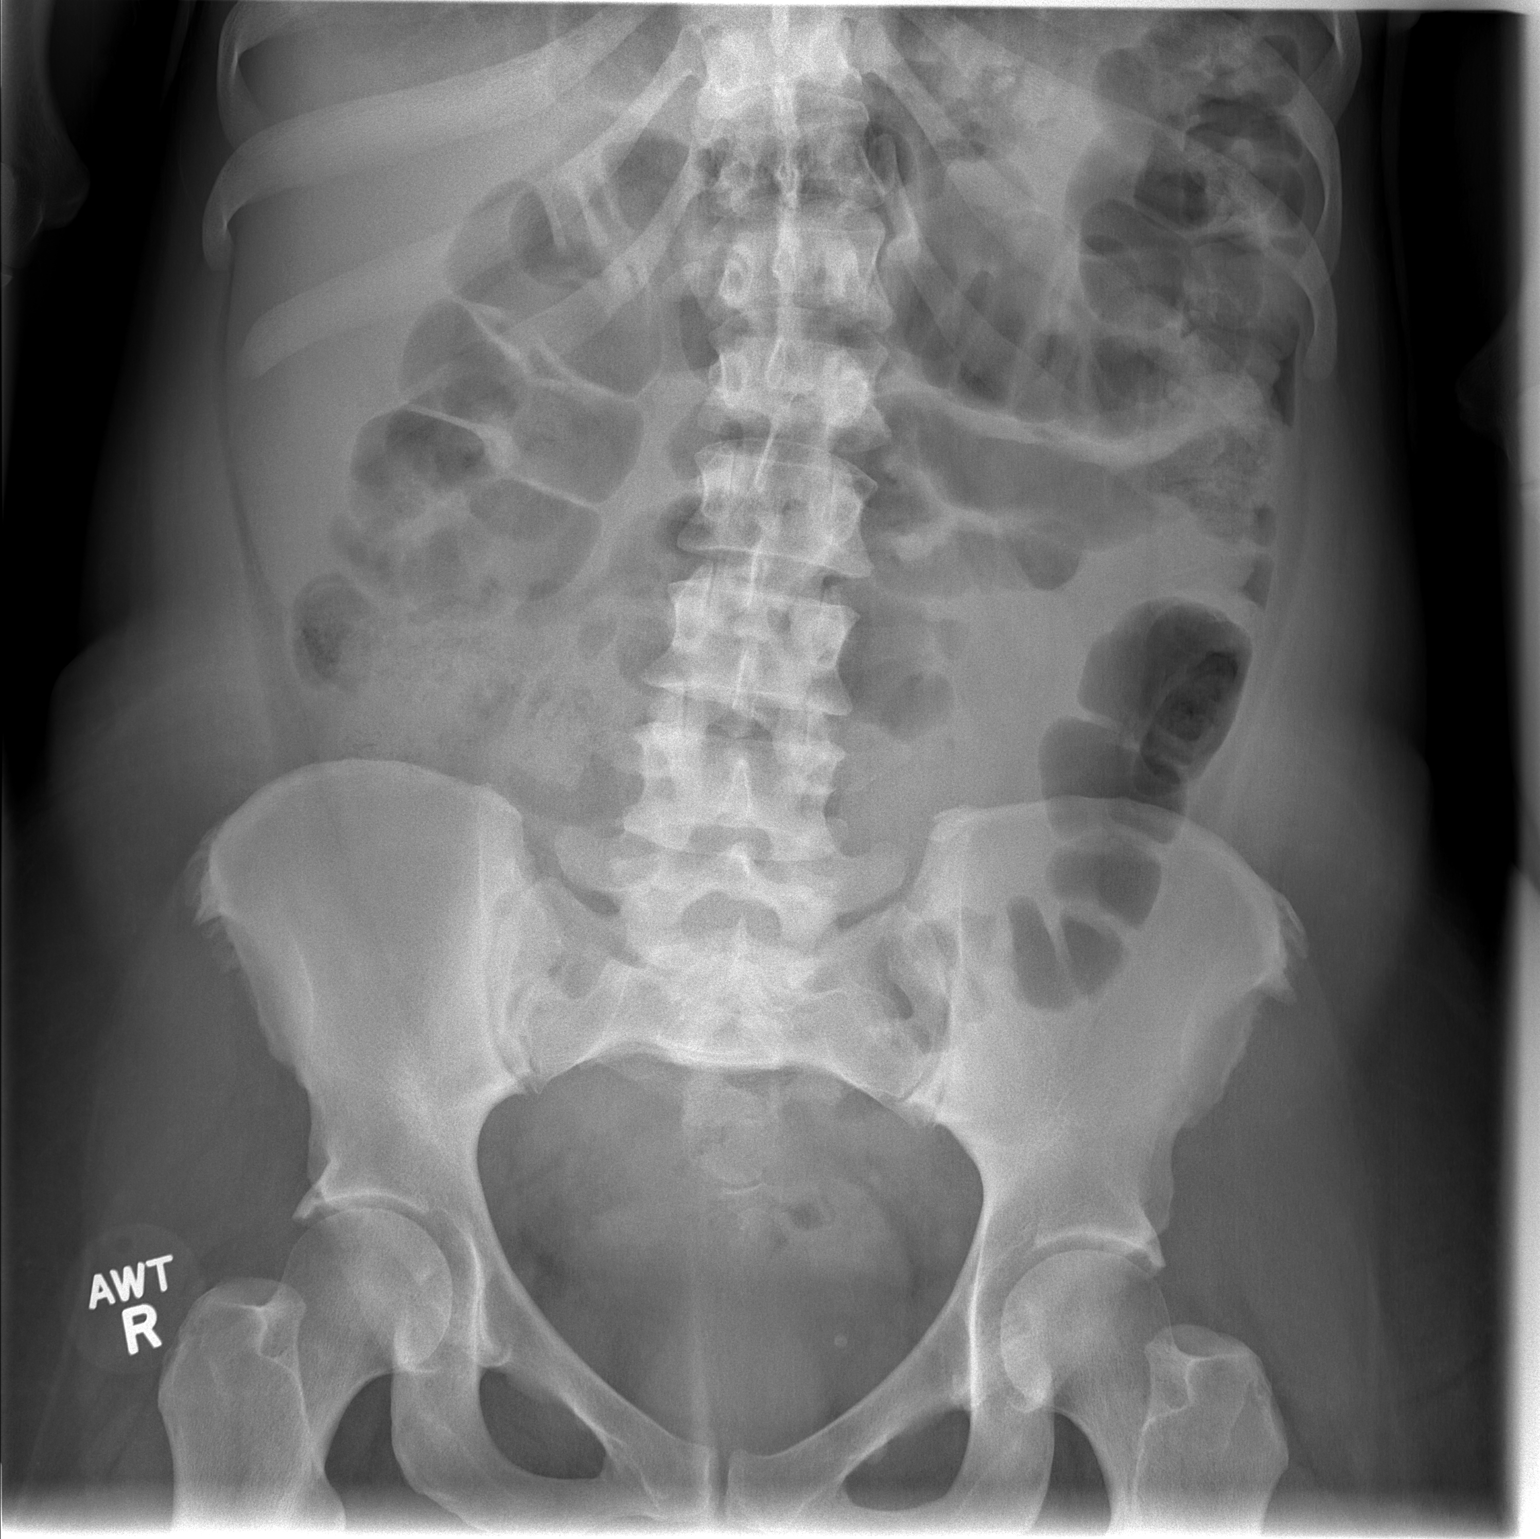

[1 of 1 positions shown; findings below may reference images not displayed]

FINDINGS: No significant bowel dilation is seen. Normal bowel gas pattern. No
significant increase in stool.

Soft tissues and skeletal structures are unremarkable.
IMPRESSION: 1. Normal supine abdomen radiograph. The small bowel obstruction
suggests on the prior study is no longer evident.

## 2017-08-22 ENCOUNTER — Other Ambulatory Visit: Payer: Self-pay | Admitting: Family

## 2017-08-22 DIAGNOSIS — D649 Anemia, unspecified: Secondary | ICD-10-CM

## 2017-08-23 ENCOUNTER — Ambulatory Visit: Payer: Self-pay | Admitting: Family

## 2017-08-23 ENCOUNTER — Other Ambulatory Visit: Payer: Self-pay

## 2017-09-18 ENCOUNTER — Other Ambulatory Visit: Payer: Self-pay | Admitting: Family

## 2017-09-19 ENCOUNTER — Other Ambulatory Visit: Payer: Self-pay

## 2017-09-19 ENCOUNTER — Ambulatory Visit: Payer: Self-pay | Admitting: Family

## 2017-09-19 ENCOUNTER — Other Ambulatory Visit: Payer: Medicaid Other

## 2017-09-20 ENCOUNTER — Other Ambulatory Visit: Payer: Medicaid Other

## 2017-09-20 ENCOUNTER — Ambulatory Visit: Payer: Self-pay | Admitting: Family

## 2018-02-01 ENCOUNTER — Encounter (HOSPITAL_COMMUNITY): Payer: Self-pay | Admitting: Emergency Medicine

## 2018-02-01 ENCOUNTER — Emergency Department (HOSPITAL_COMMUNITY): Payer: Medicaid Other

## 2018-02-01 ENCOUNTER — Inpatient Hospital Stay (HOSPITAL_COMMUNITY)
Admission: EM | Admit: 2018-02-01 | Discharge: 2018-02-06 | DRG: 372 | Disposition: A | Discharge status: Home / Self Care | Payer: Medicaid Other | Attending: Internal Medicine | Admitting: Internal Medicine

## 2018-02-01 ENCOUNTER — Other Ambulatory Visit: Payer: Self-pay

## 2018-02-01 DIAGNOSIS — N730 Acute parametritis and pelvic cellulitis: Secondary | ICD-10-CM | POA: Diagnosis not present

## 2018-02-01 DIAGNOSIS — R188 Other ascites: Secondary | ICD-10-CM | POA: Diagnosis present

## 2018-02-01 DIAGNOSIS — N739 Female pelvic inflammatory disease, unspecified: Secondary | ICD-10-CM | POA: Diagnosis present

## 2018-02-01 DIAGNOSIS — K651 Peritoneal abscess: Principal | ICD-10-CM | POA: Diagnosis present

## 2018-02-01 DIAGNOSIS — N7093 Salpingitis and oophoritis, unspecified: Secondary | ICD-10-CM | POA: Diagnosis present

## 2018-02-01 DIAGNOSIS — Z9114 Patient's other noncompliance with medication regimen: Secondary | ICD-10-CM

## 2018-02-01 DIAGNOSIS — G894 Chronic pain syndrome: Secondary | ICD-10-CM | POA: Diagnosis present

## 2018-02-01 DIAGNOSIS — N920 Excessive and frequent menstruation with regular cycle: Secondary | ICD-10-CM | POA: Diagnosis present

## 2018-02-01 DIAGNOSIS — D500 Iron deficiency anemia secondary to blood loss (chronic): Secondary | ICD-10-CM | POA: Diagnosis present

## 2018-02-01 DIAGNOSIS — Z765 Malingerer [conscious simulation]: Secondary | ICD-10-CM | POA: Diagnosis not present

## 2018-02-01 DIAGNOSIS — F17210 Nicotine dependence, cigarettes, uncomplicated: Secondary | ICD-10-CM | POA: Diagnosis present

## 2018-02-01 DIAGNOSIS — Z79891 Long term (current) use of opiate analgesic: Secondary | ICD-10-CM

## 2018-02-01 DIAGNOSIS — Z9851 Tubal ligation status: Secondary | ICD-10-CM | POA: Diagnosis not present

## 2018-02-01 DIAGNOSIS — K567 Ileus, unspecified: Secondary | ICD-10-CM | POA: Diagnosis present

## 2018-02-01 DIAGNOSIS — Z8249 Family history of ischemic heart disease and other diseases of the circulatory system: Secondary | ICD-10-CM | POA: Diagnosis not present

## 2018-02-01 DIAGNOSIS — Z833 Family history of diabetes mellitus: Secondary | ICD-10-CM | POA: Diagnosis not present

## 2018-02-01 DIAGNOSIS — E876 Hypokalemia: Secondary | ICD-10-CM | POA: Diagnosis present

## 2018-02-01 DIAGNOSIS — Z720 Tobacco use: Secondary | ICD-10-CM | POA: Diagnosis present

## 2018-02-01 DIAGNOSIS — F1190 Opioid use, unspecified, uncomplicated: Secondary | ICD-10-CM | POA: Diagnosis present

## 2018-02-01 LAB — URINALYSIS, ROUTINE W REFLEX MICROSCOPIC
Bacteria, UA: NONE SEEN
Bilirubin Urine: NEGATIVE
Glucose, UA: NEGATIVE mg/dL
Ketones, ur: NEGATIVE mg/dL
Leukocytes, UA: NEGATIVE
Nitrite: NEGATIVE
Protein, ur: NEGATIVE mg/dL
Specific Gravity, Urine: 1.017 (ref 1.005–1.030)
pH: 6 (ref 5.0–8.0)

## 2018-02-01 LAB — HCG, QUANTITATIVE, PREGNANCY: hCG, Beta Chain, Quant, S: <1 m[IU]/mL (ref <5)

## 2018-02-01 LAB — COMPREHENSIVE METABOLIC PANEL
ALT: 18 U/L (ref 14–54)
AST: 28 U/L (ref 15–41)
Albumin: 3.5 g/dL (ref 3.5–5.0)
Alkaline Phosphatase: 113 U/L (ref 38–126)
Anion gap: 9 (ref 5–15)
BUN: 7 mg/dL (ref 6–20)
CO2: 22 mmol/L (ref 22–32)
Calcium: 8.5 mg/dL — ABNORMAL LOW (ref 8.9–10.3)
Chloride: 105 mmol/L (ref 101–111)
Creatinine, Ser: 0.63 mg/dL (ref 0.44–1.00)
GFR calc Af Amer: >60 mL/min (ref >60)
GFR calc non Af Amer: >60 mL/min (ref >60)
Glucose, Bld: 94 mg/dL (ref 65–99)
Potassium: 3.4 mmol/L — ABNORMAL LOW (ref 3.5–5.1)
Sodium: 136 mmol/L (ref 135–145)
Total Bilirubin: 0.7 mg/dL (ref 0.3–1.2)
Total Protein: 7.5 g/dL (ref 6.5–8.1)

## 2018-02-01 LAB — CBC
HCT: 24.7 % — ABNORMAL LOW (ref 36.0–46.0)
Hemoglobin: 7.2 g/dL — ABNORMAL LOW (ref 12.0–15.0)
MCH: 21.7 pg — ABNORMAL LOW (ref 26.0–34.0)
MCHC: 29.1 g/dL — ABNORMAL LOW (ref 30.0–36.0)
MCV: 74.4 fL — ABNORMAL LOW (ref 78.0–100.0)
Platelets: 177 10*3/uL (ref 150–400)
RBC: 3.32 MIL/uL — ABNORMAL LOW (ref 3.87–5.11)
RDW: 18.1 % — ABNORMAL HIGH (ref 11.5–15.5)
WBC: 12.9 10*3/uL — ABNORMAL HIGH (ref 4.0–10.5)

## 2018-02-01 LAB — I-STAT BETA HCG BLOOD, ED (MC, WL, AP ONLY)
I-stat hCG, quantitative: 20.4 m[IU]/mL — ABNORMAL HIGH (ref <5)
I-stat hCG, quantitative: 22.4 m[IU]/mL — ABNORMAL HIGH (ref <5)

## 2018-02-01 LAB — LIPASE, BLOOD: Lipase: 20 U/L (ref 11–51)

## 2018-02-01 MED ORDER — PIPERACILLIN-TAZOBACTAM 3.375 G IVPB
3.3750 g | Freq: Three times a day (TID) | INTRAVENOUS | Status: DC
  Administered 2018-02-02 – 2018-02-06 (×13): 3.375 g via INTRAVENOUS
  Filled 2018-02-01 (×13): qty 50

## 2018-02-01 MED ORDER — KETOROLAC TROMETHAMINE 30 MG/ML IJ SOLN
30.0000 mg | Freq: Once | INTRAMUSCULAR | Status: AC
  Administered 2018-02-01: 30 mg via INTRAVENOUS
  Filled 2018-02-01: qty 1

## 2018-02-01 MED ORDER — SODIUM CHLORIDE 0.9 % IV SOLN
INTRAVENOUS | Status: DC
  Administered 2018-02-01: 21:00:00 via INTRAVENOUS

## 2018-02-01 MED ORDER — MORPHINE SULFATE (PF) 2 MG/ML IV SOLN
2.0000 mg | Freq: Four times a day (QID) | INTRAVENOUS | Status: DC | PRN
  Administered 2018-02-01 – 2018-02-02 (×2): 2 mg via INTRAVENOUS
  Filled 2018-02-01 (×2): qty 1

## 2018-02-01 MED ORDER — POTASSIUM CHLORIDE 10 MEQ/100ML IV SOLN
10.0000 meq | INTRAVENOUS | Status: AC
  Administered 2018-02-01 – 2018-02-02 (×4): 10 meq via INTRAVENOUS
  Filled 2018-02-01 (×4): qty 100

## 2018-02-01 MED ORDER — PIPERACILLIN-TAZOBACTAM 3.375 G IVPB 30 MIN
3.3750 g | Freq: Once | INTRAVENOUS | Status: AC
  Administered 2018-02-01: 3.375 g via INTRAVENOUS
  Filled 2018-02-01: qty 50

## 2018-02-01 MED ORDER — IOPAMIDOL (ISOVUE-300) INJECTION 61%
30.0000 mL | Freq: Once | INTRAVENOUS | Status: AC | PRN
  Administered 2018-02-01: 30 mL via ORAL

## 2018-02-01 MED ORDER — ONDANSETRON HCL 4 MG/2ML IJ SOLN
4.0000 mg | Freq: Once | INTRAMUSCULAR | Status: AC
Start: 2018-02-01 — End: 2018-02-01
  Administered 2018-02-01: 4 mg via INTRAVENOUS
  Filled 2018-02-01: qty 2

## 2018-02-01 MED ORDER — KETOROLAC TROMETHAMINE 30 MG/ML IJ SOLN
30.0000 mg | Freq: Once | INTRAMUSCULAR | Status: AC
  Administered 2018-02-01: 30 mg via INTRAVENOUS

## 2018-02-01 MED ORDER — IOPAMIDOL (ISOVUE-300) INJECTION 61%
100.0000 mL | Freq: Once | INTRAVENOUS | Status: AC | PRN
  Administered 2018-02-01: 80 mL via INTRAVENOUS

## 2018-02-01 MED ORDER — KETOROLAC TROMETHAMINE 30 MG/ML IJ SOLN
60.0000 mg | Freq: Once | INTRAMUSCULAR | Status: DC
  Filled 2018-02-01: qty 2

## 2018-02-01 MED ORDER — MORPHINE SULFATE (PF) 4 MG/ML IV SOLN
4.0000 mg | Freq: Once | INTRAVENOUS | Status: AC
  Administered 2018-02-01: 4 mg via INTRAVENOUS
  Filled 2018-02-01: qty 1

## 2018-02-01 MED ORDER — IOPAMIDOL (ISOVUE-300) INJECTION 61%
INTRAVENOUS | Status: AC
  Filled 2018-02-01: qty 100

## 2018-02-01 MED ORDER — ACETAMINOPHEN 325 MG PO TABS
650.0000 mg | ORAL_TABLET | Freq: Four times a day (QID) | ORAL | Status: DC | PRN
  Administered 2018-02-04 – 2018-02-06 (×7): 650 mg via ORAL
  Filled 2018-02-01 (×7): qty 2

## 2018-02-01 MED ORDER — KETOROLAC TROMETHAMINE 15 MG/ML IJ SOLN
15.0000 mg | Freq: Four times a day (QID) | INTRAMUSCULAR | Status: DC | PRN
  Administered 2018-02-01 – 2018-02-05 (×8): 15 mg via INTRAVENOUS
  Filled 2018-02-01 (×8): qty 1

## 2018-02-01 MED ORDER — FERROUS SULFATE 325 (65 FE) MG PO TABS
325.0000 mg | ORAL_TABLET | Freq: Every day | ORAL | Status: DC
  Filled 2018-02-01: qty 1

## 2018-02-01 MED ORDER — IOPAMIDOL (ISOVUE-300) INJECTION 61%
INTRAVENOUS | Status: AC
  Filled 2018-02-01: qty 30

## 2018-02-01 NOTE — ED Provider Notes (Signed)
[facility] DEPT Provider Note   CSN: [id number] Arrival date & time: 02/01/18  8938     History   Chief Complaint Chief Complaint  Patient presents with  . Abdominal Pain    HPI [name] is a 42 y.o. female with PMH of chronic opioid use for chronic pain, previous small bowel obstruction, and iron deficiency anemia presenting with abdominal pain and distention for 4 days duration.   Patient states she began having left sided abdominal pain 4 days ago that eventually progressed to diffuse abdominal pain. She describes the pain as burning in quality. She also complains of abdominal distention. Her last BM was about 3-4 days ago, which is not abnormal to her. She is not passing gas. She is nauseated but has been able to tolerate food and water without any episodes of vomiting.   Per the patient and chart review she had a high grade small bowel obstruction about 2 years ago. During her previous admissions, but she has never had to go to the OR.  She has typically improved with bowel rest, IV fluids, and NG tube for decompression The patient is on chronic opioids for chronic pain and has a history of constipation.    LMP beginning of this month. Patient states she noticed intermittent spotting and blood in the toilet when she would go to the bathroom. She believes it is vaginal bleeding and denies rectal bleeding.       Past Medical History:  Diagnosis Date  . Anemia   . Cervical disc disease   . Fibroids   . Heart murmur   . Low back pain     Patient Active Problem List   Diagnosis Date Noted  . Small bowel obstruction ([place]) 02/29/2016  . Recurrent abdominal pain 02/29/2016  . Chronic anemia 02/29/2016  . Chronic constipation 02/19/2016  . Narcotic dependence ([place]) 02/19/2016  . Ileus ([place]) 02/08/2016  . Chronic narcotic use 02/08/2016  . Anemia, iron deficiency   . Hypokalemia 01/29/2016  . Iron deficiency anemia due to chronic  blood loss 01/29/2016  . Anemia 01/29/2016  . Abdominal pain 01/09/2016  . Generalized abdominal pain 01/09/2016  . Nausea with vomiting 01/09/2016  . Tobacco abuse 01/09/2016  . Abdominal pain in female   . Constipation     Past Surgical History:  Procedure Laterality Date  . ANKLE SURGERY    . CESAREAN SECTION    . TUBAL LIGATION      OB History    Gravida Para Term Preterm AB Living   4 4   4   4    SAB TAB Ectopic Multiple Live Births                   Home Medications    Prior to Admission medications   Medication Sig Start Date End Date Taking? Authorizing Provider  naproxen (NAPROSYN) 500 MG tablet Take 500 mg by mouth 2 (two) times daily. 01/29/18 02/08/18 Yes [provider]  nitrofurantoin, macrocrystal-monohydrate, (MACROBID) 100 MG capsule Take 100 mg by mouth 2 (two) times daily. 01/29/18 02/03/18 Yes [provider]  potassium chloride (K-DUR) 10 MEQ tablet Take 10 mEq by mouth 2 (two) times daily. 01/29/18 02/08/18 Yes [provider]  Ondansetron HCl (ZOFRAN PO) Take 1 tablet by mouth 2 (two) times daily as needed (nausea).    [provider]  oxyCODONE-acetaminophen (PERCOCET/ROXICET) 5-325 MG tablet Take 1-2 tablets by mouth every 6 (six) hours as needed for severe  pain. Patient not taking: Reported on 02/01/2018 03/03/16   Geradine Girt, DO  pantoprazole (PROTONIX) 40 MG tablet Take 1 tablet (40 mg total) by mouth daily. Patient not taking: Reported on 02/01/2018 02/11/16   Donne Hazel, MD  polyethylene glycol [facility] / Floria Raveling) packet Take 17 g by mouth 2 (two) times daily. Patient not taking: Reported on 02/01/2018 02/19/16   Domenic Polite, MD  senna-docusate (SENOKOT-S) 8.6-50 MG tablet Take 1 tablet by mouth 2 (two) times daily. Patient not taking: Reported on 02/01/2018 02/19/16   Domenic Polite, MD    Family History Family History  Problem Relation Age of Onset  . Diabetes Maternal Grandmother   . Hypertension  Maternal Aunt   . Bowel Disease Mother     Social History Social History   Tobacco Use  . Smoking status: Current Every Day Smoker    Packs/day: 0.50    Types: Cigarettes  . Smokeless tobacco: Never Used  Substance Use Topics  . Alcohol use: No    Alcohol/week: 0.0 oz  . Drug use: No     Allergies   Patient has no known allergies.   Review of Systems Review of Systems  Constitutional: Negative for chills and fever.  HENT: Negative.   Respiratory: Positive for shortness of breath.   Cardiovascular: Negative for chest pain.  Gastrointestinal: Positive for abdominal distention, abdominal pain, constipation and nausea. Negative for diarrhea and vomiting.  Genitourinary: Positive for vaginal bleeding. Negative for dysuria and vaginal discharge.     Physical Exam Updated Vital Signs BP (!) 147/75   Pulse 80   Temp 99.7 F (37.6 C) (Oral)   Resp 18   LMP 01/16/2018   SpO2 100%   Physical Exam  Constitutional: She appears well-developed and well-nourished. She appears distressed.  HENT:  Mouth/Throat: Oropharynx is clear and moist.  Eyes: Conjunctivae are normal. No scleral icterus.  Neck: Normal range of motion. Neck supple.  Cardiovascular: Normal rate, regular rhythm, normal heart sounds and intact distal pulses.  Pulmonary/Chest: Effort normal and breath sounds normal. Tachypnea (mild 2/2 to abdominal pain) noted.  Abdominal: She exhibits distension. Bowel sounds are decreased. There is generalized tenderness (diffuse).  Lymphadenopathy:    She has no cervical adenopathy.  Skin: Skin is warm and dry.     ED Treatments / Results  Labs (all labs ordered are listed, but only abnormal results are displayed) Labs Reviewed  COMPREHENSIVE METABOLIC PANEL - Abnormal; Notable for the following components:      Result Value   Potassium 3.4 (*)    Calcium 8.5 (*)    All other components within normal limits  CBC - Abnormal; Notable for the following components:    WBC 12.9 (*)    RBC 3.32 (*)    Hemoglobin 7.2 (*)    HCT 24.7 (*)    MCV 74.4 (*)    MCH 21.7 (*)    MCHC 29.1 (*)    RDW 18.1 (*)    All other components within normal limits  I-STAT BETA HCG BLOOD, ED ([facility], [facility], [facility] ONLY) - Abnormal; Notable for the following components:   I-stat hCG, quantitative 20.4 (*)    All other components within normal limits  I-STAT BETA HCG BLOOD, ED ([facility], [facility], [facility] ONLY) - Abnormal; Notable for the following components:   I-stat hCG, quantitative 22.4 (*)    All other components within normal limits  LIPASE, BLOOD  HCG, QUANTITATIVE, PREGNANCY  URINALYSIS, ROUTINE W REFLEX MICROSCOPIC  EKG  EKG Interpretation None       Radiology Ct Abdomen Pelvis W Contrast  Result Date: 02/01/2018 CLINICAL DATA:  42 year old female with lower abdominal pain and distension. Vaginal spotting. Negative quantitative beta HCG. EXAM: CT ABDOMEN AND PELVIS WITH CONTRAST TECHNIQUE: Multidetector CT imaging of the abdomen and pelvis was performed using the standard protocol following bolus administration of intravenous contrast. CONTRAST:  63mL ISOVUE-300 IOPAMIDOL (ISOVUE-300) INJECTION 61% COMPARISON:  CT Abdomen and Pelvis 02/29/2016, and earlier. FINDINGS: Lower chest: Chronic mild cardiomegaly. No pericardial effusion. No pleural effusion. Increased dependent and bilateral posterior costophrenic angle opacity most resembles atelectasis. Hepatobiliary: Small volume of inferior perihepatic free fluid (series 2, image 39). The liver and gallbladder remain within normal limits. Pancreas: Negative. Spleen: Small volume of free fluid in the anterior left upper quadrant along the anterior aspect of the spleen (series 2, image 13). Splenic enhancement is normal. Adrenals/Urinary Tract: Normal adrenal glands. Both kidneys appear stable since 2017 and within normal limits. There is a right renal midpole simple cyst. Symmetric bilateral renal enhancement and contrast excretion. No  hydronephrosis or hydroureter. Unremarkable bladder.  Chronic left pelvic phlebolith is unchanged. Stomach/Bowel: No free air in the upper abdomen. Negative stomach and duodenum. Oral contrast was administered but has only reached the proximal to mid jejunum. There are distal fluid-filled but nondilated small bowel loops. There is retained low-density stool throughout the ascending colon. The transverse colon is distended with gas to the splenic flexure. The descending colon is more decompressed with a smaller volume of gas and retained stool. The sigmoid colon and rectum are largely decompressed. There is abnormal free fluid versus organizing fluid collection in the right lower quadrant inferior to the cecum and lateral to the appendix. The appendix does not contain much gas throughout its course, and contains hyperdense material instead, and measures at the upper limits of normal to mildly dilated, 7-8 millimeters. However, there is no phlegmon along the course of the appendix. Rather, the abnormal right lower quadrant fluid tracks along the right adnexa and into the cul-de-sac (series 2, image 69). There is associated peritoneal thickening or organization of the fluid. No extraluminal gas is identified. See additional uterus and ovary findings below. Vascular/Lymphatic: Mild Calcified aortic atherosclerosis. Major arterial structures in the abdomen and pelvis are patent. Portal venous system is patent. No lymphadenopathy. Reproductive: Mild uterine enlargement is stable since 2017. A small dystrophic calcification along the anterior fundus is unchanged, without definite associated uterine fibroid. Both adnexa regions are indistinct. The right adnexa region is inseparable from the abnormal right lower quadrant fluid which tracks into the cul-de-sac. On series 2, image 65 there is a 16 millimeter small rim enhancing area of fluid which does not seem related to bowel. Musculoskeletal: Dextroconvex thoracic  scoliosis. No acute osseous abnormality identified. IMPRESSION: 1. Scattered free fluid in the abdomen, the largest collection of which is in the right lower quadrant and appears to track along the right adnexa into the pelvic cul-de-sac. There is associated mild peritoneal or rim enhancement along the course of the dominant collection suggesting this is a developing Abscess. There is more free flowing/reactive appearing fluid in the upper abdomen adjacent to the liver and spleen. 2. The origin of #1 is unclear. Pelvic inflammatory disease (PID) is favored, with the secondary consideration being acute appendicitis. 3. Evidence of associated small and large bowel ileus, but no other discrete bowel inflammation. 4. Bilateral lung base opacity favored to be atelectasis although developing infection would be difficult  to exclude. No pleural effusion. 5. Chronic mild cardiomegaly. Electronically Signed   By: Genevie Ann M.D.   On: 02/01/2018 16:21    Procedures Procedures (including critical care time)  Medications Ordered in ED Medications  iopamidol (ISOVUE-300) 61 % injection (not administered)  iopamidol (ISOVUE-300) 61 % injection (not administered)  piperacillin-tazobactam (ZOSYN) IVPB 3.375 g (not administered)  piperacillin-tazobactam (ZOSYN) IVPB 3.375 g (not administered)  ondansetron (ZOFRAN) injection 4 mg (4 mg Intravenous Given 02/01/18 1215)  ketorolac (TORADOL) 30 MG/ML injection 30 mg (30 mg Intravenous Given 02/01/18 1309)  iopamidol (ISOVUE-300) 61 % injection 30 mL (30 mLs Oral Contrast Given 02/01/18 1511)  ketorolac (TORADOL) 30 MG/ML injection 30 mg (30 mg Intravenous Given 02/01/18 1629)  iopamidol (ISOVUE-300) 61 % injection 100 mL (80 mLs Intravenous Contrast Given 02/01/18 1542)     Initial Impression / Assessment and Plan / ED Course  I have reviewed the triage vital signs and the nursing notes.  Pertinent labs & imaging results that were available during my care of the patient  were reviewed by me and considered in my medical decision making (see chart for details).  42 yo female with PMH of SBO, chronic opioid use, and anemia presenting with abdominal pain and distention, as well as constipation for about 4 days duration. Patient has also had 4 caesarean sections in the past. Vital signs stable. Abdominal exam with distention and diffuse tenderness to palpation. Patients presentation consistent with bowel obstruction. Labs significant for WBC 12.9, Hgb 7.2 (decreased from patient's baseline of ~8); CMET within normal limits. I-stat hCG was 20, follow up serum quantitative hCG was negative.   CT abdomen and pelvis showed scattered free fluid in the abdomen, the largest collection of which is in the RLQ  and appears to track along the right adnexa into the pelvic cul-de-sac. There is associated mild peritoneal or rim enhancement along the course of the dominant collection suggesting this is a developing abscess, differentials include PID or acute appendicitis. Also with evidence of associated small and large bowel ileus. Started the patient on Zosyn.  General surgery consulted.   Patient signed out to Dr. Vanita Panda.       Final Clinical Impressions(s) / ED Diagnoses   Final diagnoses:  Intra-abdominal abscess ([place])  Ileus [facility])    ED Discharge Orders    None       Melanee Spry, MD 02/01/18 1650    Carmin Muskrat, MD 02/04/18 2040

## 2018-02-01 NOTE — ED Triage Notes (Signed)
Pt complaint of lower abdominal pain with associated constipation and vaginal spotting; denies GU symptom or n/v/d. Pt verbalizes last BM 4 days ago.

## 2018-02-01 NOTE — ED Notes (Signed)
Pt  Is upset that the pain medication she is getting is not effective and is requesting alternative pain medication

## 2018-02-01 NOTE — ED Notes (Signed)
ED TO INPATIENT HANDOFF REPORT  Name/Age/Gender [name] 42 y.o. female  Code Status    Code Status Orders  (From admission, onward)        Start     Ordered   02/01/18 1806  Full code  Continuous     02/01/18 1806    Code Status History    Date Active Date Inactive Code Status Order ID Comments User Context   02/29/2016 15:55 03/03/2016 20:40 Full Code [id number]  Lily Kocher, MD ED   02/17/2016 00:47 02/19/2016 17:55 Full Code [id number]  Kelvin Cellar, MD Inpatient   02/08/2016 00:48 02/11/2016 16:36 Full Code [id number]  Quintella Baton, MD ED   01/29/2016 06:38 02/05/2016 17:04 Full Code [id number]  Norval Morton, MD Inpatient   01/09/2016 17:40 01/12/2016 16:16 Full Code [id number]  Willia Craze, NP Inpatient   07/03/2012 23:33 07/04/2012 07:58 Full Code [id number]  Donzetta Matters., MD ED      Home/SNF/Other Home  Chief Complaint abd pain   Level of Care/Admitting Diagnosis ED Disposition    ED Disposition Condition [facility] Area: [facility] [[id number]]  Level of Care: Med-Surg [16]  Diagnosis: Intra-abdominal abscess [facility]) [[id number]]  Admitting Physician: Marene Lenz [[id number]]  Attending Physician: Jodie Echevaria, AMRIT [id number]  Estimated length of stay: past midnight tomorrow  Certification:: I certify this patient will need inpatient services for at least 2 midnights  PT Class (Do Not Modify): Inpatient [101]  PT Acc Code (Do Not Modify): Private [1]       Medical History Past Medical History:  Diagnosis Date  . Anemia   . Cervical disc disease   . Fibroids   . Heart murmur   . Low back pain     Allergies No Known Allergies  IV Location/Drains/Wounds Patient Lines/Drains/Airways Status   Active Line/Drains/Airways    Name:   Placement date:   Placement time:   Site:   Days:   Peripheral IV 02/01/18 Right Forearm   02/01/18    1238    Forearm   less than 1          Labs/Imaging Results for orders  placed or performed during the hospital encounter of 02/01/18 (from the past 48 hour(s))  Lipase, blood     Status: None   Collection Time: 02/01/18 10:35 AM  Result Value Ref Range   Lipase 20 11 - 51 U/L    Comment: Performed at [facility], [place] [address] [place], [place] [address]  Comprehensive metabolic panel     Status: Abnormal   Collection Time: 02/01/18 10:35 AM  Result Value Ref Range   Sodium 136 135 - 145 mmol/L   Potassium 3.4 (L) 3.5 - 5.1 mmol/L   Chloride 105 101 - 111 mmol/L   CO2 22 22 - 32 mmol/L   Glucose, Bld 94 65 - 99 mg/dL   BUN 7 6 - 20 mg/dL   Creatinine, Ser 0.63 0.44 - 1.00 mg/dL   Calcium 8.5 (L) 8.9 - 10.3 mg/dL   Total Protein 7.5 6.5 - 8.1 g/dL   Albumin 3.5 3.5 - 5.0 g/dL   AST 28 15 - 41 U/L   ALT 18 14 - 54 U/L   Alkaline Phosphatase 113 38 - 126 U/L   Total Bilirubin 0.7 0.3 - 1.2 mg/dL   GFR calc non Af Amer >60 >60 mL/min   GFR calc Af Amer >60 >60 mL/min    Comment: (  NOTE) The eGFR has been calculated using the CKD EPI equation. This calculation has not been validated in all clinical situations. eGFR's persistently <60 mL/min signify possible Chronic Kidney Disease.    Anion gap 9 5 - 15    Comment: Performed at [facility], [place] [address], [place], [place] [address]  CBC     Status: Abnormal   Collection Time: 02/01/18 10:35 AM  Result Value Ref Range   WBC 12.9 (H) 4.0 - 10.5 K/uL   RBC 3.32 (L) 3.87 - 5.11 MIL/uL   Hemoglobin 7.2 (L) 12.0 - 15.0 g/dL   HCT 24.7 (L) 36.0 - 46.0 %   MCV 74.4 (L) 78.0 - 100.0 fL   MCH 21.7 (L) 26.0 - 34.0 pg   MCHC 29.1 (L) 30.0 - 36.0 g/dL   RDW 18.1 (H) 11.5 - 15.5 %   Platelets 177 150 - 400 K/uL    Comment: Performed at [facility], [place] [address], [place], [place] [address]  hCG, quantitative, pregnancy     Status: None   Collection Time: 02/01/18 10:35 AM  Result Value Ref Range   hCG, Beta Chain, Quant, S <1 <5 mIU/mL     Comment: Performed at [facility], [place] [address], [place],  [address]  I-Stat beta hCG blood, ED     Status: Abnormal   Collection Time: 02/01/18 10:47 AM  Result Value Ref Range   I-stat hCG, quantitative 20.4 (H) <5 mIU/mL   Comment 3            Comment:   GEST. AGE      CONC.  (mIU/mL)   <=1 WEEK        5 - 50     2 WEEKS       50 - 500     3 WEEKS       100 - 10,000     4 WEEKS     1,000 - 30,000        FEMALE AND NON-PREGNANT FEMALE:     LESS THAN 5 mIU/mL   I-Stat beta hCG blood, ED ([facility], [facility], [facility] only)     Status: Abnormal   Collection Time: 02/01/18 12:17 PM  Result Value Ref Range   I-stat hCG, quantitative 22.4 (H) <5 mIU/mL   Comment 3            Comment:   GEST. AGE      CONC.  (mIU/mL)   <=1 WEEK        5 - 50     2 WEEKS       50 - 500     3 WEEKS       100 - 10,000     4 WEEKS     1,000 - 30,000        FEMALE AND NON-PREGNANT FEMALE:     LESS THAN 5 mIU/mL   Urinalysis, Routine w reflex microscopic     Status: Abnormal   Collection Time: 02/01/18  4:17 PM  Result Value Ref Range   Color, Urine YELLOW YELLOW   APPearance CLEAR CLEAR   Specific Gravity, Urine 1.017 1.005 - 1.030   pH 6.0 5.0 - 8.0   Glucose, UA NEGATIVE NEGATIVE mg/dL   Hgb urine dipstick MODERATE (A) NEGATIVE   Bilirubin Urine NEGATIVE NEGATIVE   Ketones, ur NEGATIVE NEGATIVE mg/dL   Protein, ur NEGATIVE NEGATIVE mg/dL   Nitrite NEGATIVE NEGATIVE   Leukocytes, UA NEGATIVE  NEGATIVE   RBC / HPF 0-5 0 - 5 RBC/hpf   WBC, UA 0-5 0 - 5 WBC/hpf   Bacteria, UA NONE SEEN NONE SEEN   Squamous Epithelial / LPF 0-5 (A) NONE SEEN    Comment: Performed at [facility], [place] [address] [place], [place] [address]   Ct Abdomen Pelvis W Contrast  Result Date: 02/01/2018 CLINICAL DATA:  42 year old female with lower abdominal pain and distension. Vaginal spotting. Negative quantitative beta HCG. EXAM: CT ABDOMEN AND PELVIS WITH CONTRAST TECHNIQUE: Multidetector CT  imaging of the abdomen and pelvis was performed using the standard protocol following bolus administration of intravenous contrast. CONTRAST:  63m ISOVUE-300 IOPAMIDOL (ISOVUE-300) INJECTION 61% COMPARISON:  CT Abdomen and Pelvis 02/29/2016, and earlier. FINDINGS: Lower chest: Chronic mild cardiomegaly. No pericardial effusion. No pleural effusion. Increased dependent and bilateral posterior costophrenic angle opacity most resembles atelectasis. Hepatobiliary: Small volume of inferior perihepatic free fluid (series 2, image 39). The liver and gallbladder remain within normal limits. Pancreas: Negative. Spleen: Small volume of free fluid in the anterior left upper quadrant along the anterior aspect of the spleen (series 2, image 13). Splenic enhancement is normal. Adrenals/Urinary Tract: Normal adrenal glands. Both kidneys appear stable since 2017 and within normal limits. There is a right renal midpole simple cyst. Symmetric bilateral renal enhancement and contrast excretion. No hydronephrosis or hydroureter. Unremarkable bladder.  Chronic left pelvic phlebolith is unchanged. Stomach/Bowel: No free air in the upper abdomen. Negative stomach and duodenum. Oral contrast was administered but has only reached the proximal to mid jejunum. There are distal fluid-filled but nondilated small bowel loops. There is retained low-density stool throughout the ascending colon. The transverse colon is distended with gas to the splenic flexure. The descending colon is more decompressed with a smaller volume of gas and retained stool. The sigmoid colon and rectum are largely decompressed. There is abnormal free fluid versus organizing fluid collection in the right lower quadrant inferior to the cecum and lateral to the appendix. The appendix does not contain much gas throughout its course, and contains hyperdense material instead, and measures at the upper limits of normal to mildly dilated, 7-8 millimeters. However, there is no  phlegmon along the course of the appendix. Rather, the abnormal right lower quadrant fluid tracks along the right adnexa and into the cul-de-sac (series 2, image 69). There is associated peritoneal thickening or organization of the fluid. No extraluminal gas is identified. See additional uterus and ovary findings below. Vascular/Lymphatic: Mild Calcified aortic atherosclerosis. Major arterial structures in the abdomen and pelvis are patent. Portal venous system is patent. No lymphadenopathy. Reproductive: Mild uterine enlargement is stable since 2017. A small dystrophic calcification along the anterior fundus is unchanged, without definite associated uterine fibroid. Both adnexa regions are indistinct. The right adnexa region is inseparable from the abnormal right lower quadrant fluid which tracks into the cul-de-sac. On series 2, image 65 there is a 16 millimeter small rim enhancing area of fluid which does not seem related to bowel. Musculoskeletal: Dextroconvex thoracic scoliosis. No acute osseous abnormality identified. IMPRESSION: 1. Scattered free fluid in the abdomen, the largest collection of which is in the right lower quadrant and appears to track along the right adnexa into the pelvic cul-de-sac. There is associated mild peritoneal or rim enhancement along the course of the dominant collection suggesting this is a developing Abscess. There is more free flowing/reactive appearing fluid in the upper abdomen adjacent to the liver and spleen. 2. The origin of #1  is unclear. Pelvic inflammatory disease (PID) is favored, with the secondary consideration being acute appendicitis. 3. Evidence of associated small and large bowel ileus, but no other discrete bowel inflammation. 4. Bilateral lung base opacity favored to be atelectasis although developing infection would be difficult to exclude. No pleural effusion. 5. Chronic mild cardiomegaly. Electronically Signed   By: Genevie Ann M.D.   On: 02/01/2018 16:21     Pending Labs Unresulted Labs (From admission, onward)   Start     Ordered   02/02/18 0500  HIV antibody  Tomorrow morning,   R     02/01/18 1801   02/02/18 1173  Basic metabolic panel  Tomorrow morning,   R     02/01/18 1806   02/02/18 0500  CBC  Tomorrow morning,   R     02/01/18 1806   02/02/18 0500  Iron and TIBC  Tomorrow morning,   R     02/01/18 1830   02/02/18 0500  Ferritin  Tomorrow morning,   R     02/01/18 1830   02/01/18 1807  Culture, blood (routine x 2)  BLOOD CULTURE X 2,   R     02/01/18 1806   02/01/18 1806  Urine culture  Once,   R     02/01/18 1806   02/01/18 1800  Occult blood card to lab, stool  Once,   R     02/01/18 1759      Vitals/Pain Today's Vitals   02/01/18 1617 02/01/18 1630 02/01/18 1705 02/01/18 1838  BP: (!) 187/149 (!) 147/75  120/72  Pulse: (!) 119 80  79  Resp: 18   15  Temp:      TempSrc:      SpO2: 93% 100%  100%  PainSc:   10-Worst pain ever     Isolation Precautions No active isolations  Medications Medications  iopamidol (ISOVUE-300) 61 % injection (not administered)  iopamidol (ISOVUE-300) 61 % injection (not administered)  piperacillin-tazobactam (ZOSYN) IVPB 3.375 g (not administered)  0.9 %  sodium chloride infusion (not administered)  acetaminophen (TYLENOL) tablet 650 mg (not administered)  morphine 2 MG/ML injection 2 mg (not administered)  ketorolac (TORADOL) 15 MG/ML injection 15 mg (not administered)  ferrous sulfate tablet 325 mg (not administered)  potassium chloride 10 mEq in 100 mL IVPB (not administered)  ondansetron (ZOFRAN) injection 4 mg (4 mg Intravenous Given 02/01/18 1215)  ketorolac (TORADOL) 30 MG/ML injection 30 mg (30 mg Intravenous Given 02/01/18 1309)  iopamidol (ISOVUE-300) 61 % injection 30 mL (30 mLs Oral Contrast Given 02/01/18 1511)  ketorolac (TORADOL) 30 MG/ML injection 30 mg (30 mg Intravenous Given 02/01/18 1629)  iopamidol (ISOVUE-300) 61 % injection 100 mL (80 mLs Intravenous Contrast  Given 02/01/18 1542)  piperacillin-tazobactam (ZOSYN) IVPB 3.375 g (0 g Intravenous Stopped 02/01/18 1839)  morphine 4 MG/ML injection 4 mg (4 mg Intravenous Given 02/01/18 1730)    Mobility walks

## 2018-02-01 NOTE — H&P (Addendum)
History and Physical    [name] SWN:[id number] DOB: Aug 09, 1976 DOA: 02/01/2018  PCP: [facility]   Patient coming from: Home    Chief Complaint: Abdominal pain  HPI: [name] is a 42 y.o. female with medical history significant of iron deficiency anemia, menorrhagia ,small bowel obstruction,4 cesarean sections,UTI, trichomoniasis, chronic pain syndrome who presented to the emergency department with complaints of abdominal pain and abdominal distention since last 3-4 days . Patient states that she started having abdomen pain on the left lower quadrant about 3 days ago.  It is started as burning pain which eventually progressed to constant and throbbing pain.  Since today, she has been having pain all over her abdomen.  She was feeling like something was moving from one side to other.  Patient denies any diarrhea or dysuria.  She denies any fever or chills.  Her last bowel movement was about 3-4 days ago.  She has been able to tolerate food and water without any episodes of nausea or vomiting. Patient had 4 cesarean sections altogether.  After that she had an episode of small bowel obstruction which was associated with adhesions.  Patient reports of having heavy menses and she has history of chronic iron deficiency anemia.  She was given iron supplements but she hasnot been taking it recently. Patient says she is not currently sexually active.  She states she was diagnosed with trichomonas and was treated along with her husband a year ago.  Patient was [facility] at [facility] a few days ago for UTI and was given antibiotics and potassium supplements.  ED Course: Patient underwent a CT abdomen/pelvis with contrast which showed scattered free fluid in the abdomen, associated mild peritoneal or rim enhancement along the course of the dominant collection suggesting  developing Abscess.  Patient was started on IV antibiotics and IV pain meds.  General surgery was consulted  .  Review of Systems: As per HPI otherwise 10 point review of systems negative.    Past Medical History:  Diagnosis Date  . Anemia   . Cervical disc disease   . Fibroids   . Heart murmur   . Low back pain     Past Surgical History:  Procedure Laterality Date  . ANKLE SURGERY    . CESAREAN SECTION    . TUBAL LIGATION       reports that she has been smoking cigarettes.  She has been smoking about 0.50 packs per day. she has never used smokeless tobacco. She reports that she does not drink alcohol or use drugs.  No Known Allergies  Family History  Problem Relation Age of Onset  . Diabetes Maternal Grandmother   . Hypertension Maternal Aunt   . Bowel Disease Mother      Prior to Admission medications   Medication Sig Start Date End Date Taking? Authorizing Provider  naproxen (NAPROSYN) 500 MG tablet Take 500 mg by mouth 2 (two) times daily. 01/29/18 02/08/18 Yes [provider]  nitrofurantoin, macrocrystal-monohydrate, (MACROBID) 100 MG capsule Take 100 mg by mouth 2 (two) times daily. 01/29/18 02/03/18 Yes [provider]  potassium chloride (K-DUR) 10 MEQ tablet Take 10 mEq by mouth 2 (two) times daily. 01/29/18 02/08/18 Yes [provider]    Physical Exam: Vitals:   02/01/18 1500 02/01/18 1530 02/01/18 1617 02/01/18 1630  BP: 127/76 132/75 (!) 187/149 (!) 147/75  Pulse: 74 88 (!) 119 80  Resp:   18   Temp:  TempSrc:      SpO2: 99% 100% 93% 100%    Constitutional: NAD, calm, comfortable Vitals:   02/01/18 1500 02/01/18 1530 02/01/18 1617 02/01/18 1630  BP: 127/76 132/75 (!) 187/149 (!) 147/75  Pulse: 74 88 (!) 119 80  Resp:   18   Temp:      TempSrc:      SpO2: 99% 100% 93% 100%   Eyes: PERRL, lids and conjunctivae normal ENMT: Mucous membranes are moist. Posterior pharynx clear of any exudate or lesions.Normal dentition.  Neck: normal, supple, no masses, no thyromegaly,tattos Respiratory: clear to auscultation bilaterally, no  wheezing, no crackles. Normal respiratory effort. No accessory muscle use.  Cardiovascular: Sinus tachycardia,Regular rate and rhythm, no murmurs / rubs / gallops. No extremity edema. 2+ pedal pulses. No carotid bruits.  Abdomen: Distended, tense , generalized tenderness  musculoskeletal: no clubbing / cyanosis. No joint deformity upper and lower extremities. Good ROM, no contractures. Normal muscle tone.  Skin: no rashes, lesions, ulcers. No induration Neurologic: CN 2-12 grossly intact. Sensation intact, DTR normal. Strength 5/5 in all 4.  Psychiatric: Normal judgment and insight. Alert and oriented x 3. Normal mood.   Foley Catheter:None  Labs on Admission: I have personally reviewed following labs and imaging studies  CBC: Recent Labs  Lab 02/01/18 1035  WBC 12.9*  HGB 7.2*  HCT 24.7*  MCV 74.4*  PLT 956   Basic Metabolic Panel: Recent Labs  Lab 02/01/18 1035  NA 136  K 3.4*  CL 105  CO2 22  GLUCOSE 94  BUN 7  CREATININE 0.63  CALCIUM 8.5*   GFR: CrCl cannot be calculated (Unknown ideal weight.). Liver Function Tests: Recent Labs  Lab 02/01/18 1035  AST 28  ALT 18  ALKPHOS 113  BILITOT 0.7  PROT 7.5  ALBUMIN 3.5   Recent Labs  Lab 02/01/18 1035  LIPASE 20   No results for input(s): AMMONIA in the last 168 hours. Coagulation Profile: No results for input(s): INR, PROTIME in the last 168 hours. Cardiac Enzymes: No results for input(s): CKTOTAL, CKMB, CKMBINDEX, TROPONINI in the last 168 hours. BNP (last 3 results) No results for input(s): PROBNP in the last 8760 hours. HbA1C: No results for input(s): HGBA1C in the last 72 hours. CBG: No results for input(s): GLUCAP in the last 168 hours. Lipid Profile: No results for input(s): CHOL, HDL, LDLCALC, TRIG, CHOLHDL, LDLDIRECT in the last 72 hours. Thyroid Function Tests: No results for input(s): TSH, T4TOTAL, FREET4, T3FREE, THYROIDAB in the last 72 hours. Anemia Panel: No results for input(s):  VITAMINB12, FOLATE, FERRITIN, TIBC, IRON, RETICCTPCT in the last 72 hours. Urine analysis:    Component Value Date/Time   COLORURINE YELLOW 02/01/2018 [place] 02/01/2018 1617   LABSPEC 1.017 02/01/2018 1617   PHURINE 6.0 02/01/2018 1617   GLUCOSEU NEGATIVE 02/01/2018 1617   HGBUR MODERATE (A) 02/01/2018 1617   BILIRUBINUR NEGATIVE 02/01/2018 1617   KETONESUR NEGATIVE 02/01/2018 1617   PROTEINUR NEGATIVE 02/01/2018 1617   UROBILINOGEN 0.2 08/25/2015 1220   NITRITE NEGATIVE 02/01/2018 1617   LEUKOCYTESUR NEGATIVE 02/01/2018 1617    Radiological Exams on Admission: Ct Abdomen Pelvis W Contrast  Result Date: 02/01/2018 CLINICAL DATA:  42 year old female with lower abdominal pain and distension. Vaginal spotting. Negative quantitative beta HCG. EXAM: CT ABDOMEN AND PELVIS WITH CONTRAST TECHNIQUE: Multidetector CT imaging of the abdomen and pelvis was performed using the standard protocol following bolus administration of intravenous contrast. CONTRAST:  31mL ISOVUE-300 IOPAMIDOL (ISOVUE-300) INJECTION 61%  COMPARISON:  CT Abdomen and Pelvis 02/29/2016, and earlier. FINDINGS: Lower chest: Chronic mild cardiomegaly. No pericardial effusion. No pleural effusion. Increased dependent and bilateral posterior costophrenic angle opacity most resembles atelectasis. Hepatobiliary: Small volume of inferior perihepatic free fluid (series 2, image 39). The liver and gallbladder remain within normal limits. Pancreas: Negative. Spleen: Small volume of free fluid in the anterior left upper quadrant along the anterior aspect of the spleen (series 2, image 13). Splenic enhancement is normal. Adrenals/Urinary Tract: Normal adrenal glands. Both kidneys appear stable since 2017 and within normal limits. There is a right renal midpole simple cyst. Symmetric bilateral renal enhancement and contrast excretion. No hydronephrosis or hydroureter. Unremarkable bladder.  Chronic left pelvic phlebolith is  unchanged. Stomach/Bowel: No free air in the upper abdomen. Negative stomach and duodenum. Oral contrast was administered but has only reached the proximal to mid jejunum. There are distal fluid-filled but nondilated small bowel loops. There is retained low-density stool throughout the ascending colon. The transverse colon is distended with gas to the splenic flexure. The descending colon is more decompressed with a smaller volume of gas and retained stool. The sigmoid colon and rectum are largely decompressed. There is abnormal free fluid versus organizing fluid collection in the right lower quadrant inferior to the cecum and lateral to the appendix. The appendix does not contain much gas throughout its course, and contains hyperdense material instead, and measures at the upper limits of normal to mildly dilated, 7-8 millimeters. However, there is no phlegmon along the course of the appendix. Rather, the abnormal right lower quadrant fluid tracks along the right adnexa and into the cul-de-sac (series 2, image 69). There is associated peritoneal thickening or organization of the fluid. No extraluminal gas is identified. See additional uterus and ovary findings below. Vascular/Lymphatic: Mild Calcified aortic atherosclerosis. Major arterial structures in the abdomen and pelvis are patent. Portal venous system is patent. No lymphadenopathy. Reproductive: Mild uterine enlargement is stable since 2017. A small dystrophic calcification along the anterior fundus is unchanged, without definite associated uterine fibroid. Both adnexa regions are indistinct. The right adnexa region is inseparable from the abnormal right lower quadrant fluid which tracks into the cul-de-sac. On series 2, image 65 there is a 16 millimeter small rim enhancing area of fluid which does not seem related to bowel. Musculoskeletal: Dextroconvex thoracic scoliosis. No acute osseous abnormality identified. IMPRESSION: 1. Scattered free fluid in the  abdomen, the largest collection of which is in the right lower quadrant and appears to track along the right adnexa into the pelvic cul-de-sac. There is associated mild peritoneal or rim enhancement along the course of the dominant collection suggesting this is a developing Abscess. There is more free flowing/reactive appearing fluid in the upper abdomen adjacent to the liver and spleen. 2. The origin of #1 is unclear. Pelvic inflammatory disease (PID) is favored, with the secondary consideration being acute appendicitis. 3. Evidence of associated small and large bowel ileus, but no other discrete bowel inflammation. 4. Bilateral lung base opacity favored to be atelectasis although developing infection would be difficult to exclude. No pleural effusion. 5. Chronic mild cardiomegaly. Electronically Signed   By: Genevie Ann M.D.   On: 02/01/2018 16:21     Assessment/Plan Principal Problem:   Intra-abdominal abscess (HCC) Active Problems:   Tobacco abuse   Hypokalemia   Iron deficiency anemia due to chronic blood loss   Chronic narcotic use  Intra-abdominal abscess: Unclear etiology.  Could also be associated with pelvic inflammatory disease.  General surgery consulted. Started on antibiotics.  On Zosyn. As per the ED and general surgery discussion ,abscess is not amenable for drainage at present.But we will follow up with surgery after their evaluation.Will keep her n.p.o. pending surgical evaluation. Patient has history of trichomoniasis in the past.We will check chlamydia, gonorrhea and HIV.  Leukocytosis: We will continue to monitor the trend.  Continue antibiotics.  Blood cultures have been sent.  We will follow-up.  Hypokalemia: On supplementation.  Check levels in the morning  Tobacco abuse: Currently smokes 1 pack a day.  Counseled for smoking cessation.  History of menorrhagia/iron deficiency anemia: Reports heavy menstrual bleeding.  Her hemoglobin ranges from 7-8.  Denies any rectal  bleeding or dark stools. Will start on iron supplementation.Will check iron studies. Check FOBT once.  H/O recent urinary tract infection: Was seen at [facility] and started on nitrofurantoin.  Denies any urinary symptoms at present    Severity of Illness:  I certify that at the point of admission it is my clinical judgment that the patient will require inpatient hospital care spanning beyond 2 midnights from the point of admission due to high intensity of service, high risk for further deterioration and high frequency of surveillance required.    DVT prophylaxis:  Code Status:  Family Communication:  Consults called:      Marene Lenz MD [facility] Pager [phone]  If 7PM-7AM, please contact night-coverage [email or web address] Password [facility]  02/01/2018, 6:17 PM

## 2018-02-01 NOTE — Consult Note (Signed)
Reason for Consult: Abdominal pain Referring Physician: Dr. Kathyrn Lass [name] is an 42 y.o. female.  HPI: The patient is a 42 year old black female who presents with 3-4 days of lower abdominal pain.  She has a low-grade fever in the emergency department.  She has had some nausea but no vomiting with it.  Her white count is slightly elevated.  She had a CT scan performed that shows rim-enhancing fluid in the pelvis.  The etiology is unclear.  The radiologist favor pelvic inflammatory disease but certainly appendicitis is also possible.    Past Medical History:  Diagnosis Date  . Anemia   . Cervical disc disease   . Fibroids   . Heart murmur   . Low back pain     Past Surgical History:  Procedure Laterality Date  . ANKLE SURGERY    . CESAREAN SECTION    . TUBAL LIGATION      Family History  Problem Relation Age of Onset  . Diabetes Maternal Grandmother   . Hypertension Maternal Aunt   . Bowel Disease Mother     Social History:  reports that she has been smoking cigarettes.  She has been smoking about 0.50 packs per day. she has never used smokeless tobacco. She reports that she does not drink alcohol or use drugs.  Allergies: No Known Allergies  Medications: I have reviewed the patient's current medications.  Results for orders placed or performed during the hospital encounter of 02/01/18 (from the past 48 hour(s))  Lipase, blood     Status: None   Collection Time: 02/01/18 10:35 AM  Result Value Ref Range   Lipase 20 11 - 51 U/L    Comment: Performed at [facility], [place] [address], [place], [place] [address]  Comprehensive metabolic panel     Status: Abnormal   Collection Time: 02/01/18 10:35 AM  Result Value Ref Range   Sodium 136 135 - 145 mmol/L   Potassium 3.4 (L) 3.5 - 5.1 mmol/L   Chloride 105 101 - 111 mmol/L   CO2 22 22 - 32 mmol/L   Glucose, Bld 94 65 - 99 mg/dL   BUN 7 6 - 20 mg/dL   Creatinine, Ser 0.63 0.44 - 1.00 mg/dL    Calcium 8.5 (L) 8.9 - 10.3 mg/dL   Total Protein 7.5 6.5 - 8.1 g/dL   Albumin 3.5 3.5 - 5.0 g/dL   AST 28 15 - 41 U/L   ALT 18 14 - 54 U/L   Alkaline Phosphatase 113 38 - 126 U/L   Total Bilirubin 0.7 0.3 - 1.2 mg/dL   GFR calc non Af Amer >60 >60 mL/min   GFR calc Af Amer >60 >60 mL/min    Comment: (NOTE) The eGFR has been calculated using the CKD EPI equation. This calculation has not been validated in all clinical situations. eGFR's persistently <60 mL/min signify possible Chronic Kidney Disease.    Anion gap 9 5 - 15    Comment: Performed at [facility], [place] [address], [place],  [address]  CBC     Status: Abnormal   Collection Time: 02/01/18 10:35 AM  Result Value Ref Range   WBC 12.9 (H) 4.0 - 10.5 K/uL   RBC 3.32 (L) 3.87 - 5.11 MIL/uL   Hemoglobin 7.2 (L) 12.0 - 15.0 g/dL   HCT 24.7 (L) 36.0 - 46.0 %   MCV 74.4 (L) 78.0 - 100.0 fL   MCH 21.7 (L) 26.0 - 34.0 pg  MCHC 29.1 (L) 30.0 - 36.0 g/dL   RDW 18.1 (H) 11.5 - 15.5 %   Platelets 177 150 - 400 K/uL    Comment: Performed at [facility], [place] [address], [place], [place] [address]  hCG, quantitative, pregnancy     Status: None   Collection Time: 02/01/18 10:35 AM  Result Value Ref Range   hCG, Beta Chain, Quant, S <1 <5 mIU/mL    Comment: Performed at [facility], [place] [address], [place], [place] [address]  I-Stat beta hCG blood, ED     Status: Abnormal   Collection Time: 02/01/18 10:47 AM  Result Value Ref Range   I-stat hCG, quantitative 20.4 (H) <5 mIU/mL   Comment 3            Comment:   GEST. AGE      CONC.  (mIU/mL)   <=1 WEEK        5 - 50     2 WEEKS       50 - 500     3 WEEKS       100 - 10,000     4 WEEKS     1,000 - 30,000        FEMALE AND NON-PREGNANT FEMALE:     LESS THAN 5 mIU/mL   I-Stat beta hCG blood, ED ([facility], [facility], [facility] only)     Status: Abnormal   Collection Time: 02/01/18 12:17 PM  Result Value Ref Range   I-stat hCG,  quantitative 22.4 (H) <5 mIU/mL   Comment 3            Comment:   GEST. AGE      CONC.  (mIU/mL)   <=1 WEEK        5 - 50     2 WEEKS       50 - 500     3 WEEKS       100 - 10,000     4 WEEKS     1,000 - 30,000        FEMALE AND NON-PREGNANT FEMALE:     LESS THAN 5 mIU/mL   Urinalysis, Routine w reflex microscopic     Status: Abnormal   Collection Time: 02/01/18  4:17 PM  Result Value Ref Range   Color, Urine YELLOW YELLOW   APPearance CLEAR CLEAR   Specific Gravity, Urine 1.017 1.005 - 1.030   pH 6.0 5.0 - 8.0   Glucose, UA NEGATIVE NEGATIVE mg/dL   Hgb urine dipstick MODERATE (A) NEGATIVE   Bilirubin Urine NEGATIVE NEGATIVE   Ketones, ur NEGATIVE NEGATIVE mg/dL   Protein, ur NEGATIVE NEGATIVE mg/dL   Nitrite NEGATIVE NEGATIVE   Leukocytes, UA NEGATIVE NEGATIVE   RBC / HPF 0-5 0 - 5 RBC/hpf   WBC, UA 0-5 0 - 5 WBC/hpf   Bacteria, UA NONE SEEN NONE SEEN   Squamous Epithelial / LPF 0-5 (A) NONE SEEN    Comment: Performed at [facility], [place] [address] [place], [place] [address]    Ct Abdomen Pelvis W Contrast  Result Date: 02/01/2018 CLINICAL DATA:  42 year old female with lower abdominal pain and distension. Vaginal spotting. Negative quantitative beta HCG. EXAM: CT ABDOMEN AND PELVIS WITH CONTRAST TECHNIQUE: Multidetector CT imaging of the abdomen and pelvis was performed using the standard protocol following bolus administration of intravenous contrast. CONTRAST:  22m ISOVUE-300 IOPAMIDOL (ISOVUE-300) INJECTION 61% COMPARISON:  CT Abdomen and Pelvis 02/29/2016, and earlier. FINDINGS: Lower chest:  Chronic mild cardiomegaly. No pericardial effusion. No pleural effusion. Increased dependent and bilateral posterior costophrenic angle opacity most resembles atelectasis. Hepatobiliary: Small volume of inferior perihepatic free fluid (series 2, image 39). The liver and gallbladder remain within normal limits. Pancreas: Negative. Spleen: Small volume of free fluid in  the anterior left upper quadrant along the anterior aspect of the spleen (series 2, image 13). Splenic enhancement is normal. Adrenals/Urinary Tract: Normal adrenal glands. Both kidneys appear stable since 2017 and within normal limits. There is a right renal midpole simple cyst. Symmetric bilateral renal enhancement and contrast excretion. No hydronephrosis or hydroureter. Unremarkable bladder.  Chronic left pelvic phlebolith is unchanged. Stomach/Bowel: No free air in the upper abdomen. Negative stomach and duodenum. Oral contrast was administered but has only reached the proximal to mid jejunum. There are distal fluid-filled but nondilated small bowel loops. There is retained low-density stool throughout the ascending colon. The transverse colon is distended with gas to the splenic flexure. The descending colon is more decompressed with a smaller volume of gas and retained stool. The sigmoid colon and rectum are largely decompressed. There is abnormal free fluid versus organizing fluid collection in the right lower quadrant inferior to the cecum and lateral to the appendix. The appendix does not contain much gas throughout its course, and contains hyperdense material instead, and measures at the upper limits of normal to mildly dilated, 7-8 millimeters. However, there is no phlegmon along the course of the appendix. Rather, the abnormal right lower quadrant fluid tracks along the right adnexa and into the cul-de-sac (series 2, image 69). There is associated peritoneal thickening or organization of the fluid. No extraluminal gas is identified. See additional uterus and ovary findings below. Vascular/Lymphatic: Mild Calcified aortic atherosclerosis. Major arterial structures in the abdomen and pelvis are patent. Portal venous system is patent. No lymphadenopathy. Reproductive: Mild uterine enlargement is stable since 2017. A small dystrophic calcification along the anterior fundus is unchanged, without definite  associated uterine fibroid. Both adnexa regions are indistinct. The right adnexa region is inseparable from the abnormal right lower quadrant fluid which tracks into the cul-de-sac. On series 2, image 65 there is a 16 millimeter small rim enhancing area of fluid which does not seem related to bowel. Musculoskeletal: Dextroconvex thoracic scoliosis. No acute osseous abnormality identified. IMPRESSION: 1. Scattered free fluid in the abdomen, the largest collection of which is in the right lower quadrant and appears to track along the right adnexa into the pelvic cul-de-sac. There is associated mild peritoneal or rim enhancement along the course of the dominant collection suggesting this is a developing Abscess. There is more free flowing/reactive appearing fluid in the upper abdomen adjacent to the liver and spleen. 2. The origin of #1 is unclear. Pelvic inflammatory disease (PID) is favored, with the secondary consideration being acute appendicitis. 3. Evidence of associated small and large bowel ileus, but no other discrete bowel inflammation. 4. Bilateral lung base opacity favored to be atelectasis although developing infection would be difficult to exclude. No pleural effusion. 5. Chronic mild cardiomegaly. Electronically Signed   By: Genevie Ann M.D.   On: 02/01/2018 16:21    Review of Systems  Constitutional: Positive for fever.  HENT: Negative.   Eyes: Negative.   Respiratory: Negative.   Cardiovascular: Negative.   Gastrointestinal: Positive for abdominal pain and nausea. Negative for vomiting.  Genitourinary: Negative.   Musculoskeletal: Negative.   Skin: Negative.   Neurological: Negative.   Endo/Heme/Allergies: Negative.   Psychiatric/Behavioral: Negative.  Blood pressure 115/86, pulse 95, temperature 99.9 F (37.7 C), temperature source Oral, resp. rate 16, last menstrual period 01/16/2018, SpO2 99 %. Physical Exam  Constitutional: She is oriented to person, place, and time. She appears  well-developed and well-nourished. No distress.  HENT:  Head: Normocephalic and atraumatic.  Mouth/Throat: No oropharyngeal exudate.  Eyes: Conjunctivae and EOM are normal. Pupils are equal, round, and reactive to light.  Neck: Normal range of motion. Neck supple.  Cardiovascular: Normal rate, regular rhythm and normal heart sounds.  Respiratory: Effort normal and breath sounds normal. No stridor.  GI: Soft. She exhibits distension. There is tenderness.  Musculoskeletal: Normal range of motion. She exhibits no edema or tenderness.  Neurological: She is alert and oriented to person, place, and time. Coordination normal.  Skin: Skin is warm and dry. No erythema.  Psychiatric: She has a normal mood and affect. Her behavior is normal. Thought content normal.    Assessment/Plan: The patient appears to have a small abscess in the pelvis with an unclear etiology.  At this point I would recommend bowel rest and IV antibiotics.  I would recommend consulting with interventional radiology to see if the abscess could be either aspirated or drained.  If it can be aspirated it will help with culture and narrowing down the antibiotics.  I would also recommend consulting GYN as this could be pelvic inflammatory disease.  We will follow her closely with you.  TOTH III,PAUL S 02/01/2018, 8:29 PM

## 2018-02-02 DIAGNOSIS — K651 Peritoneal abscess: Principal | ICD-10-CM

## 2018-02-02 DIAGNOSIS — K567 Ileus, unspecified: Secondary | ICD-10-CM

## 2018-02-02 DIAGNOSIS — D500 Iron deficiency anemia secondary to blood loss (chronic): Secondary | ICD-10-CM

## 2018-02-02 LAB — IRON AND TIBC
Iron: 8 ug/dL — ABNORMAL LOW (ref 28–170)
Saturation Ratios: 3 % — ABNORMAL LOW (ref 10.4–31.8)
TIBC: 276 ug/dL (ref 250–450)
UIBC: 268 ug/dL

## 2018-02-02 LAB — CBC
HCT: 20.2 % — ABNORMAL LOW (ref 36.0–46.0)
Hemoglobin: 6.2 g/dL — CL (ref 12.0–15.0)
MCH: 22.3 pg — ABNORMAL LOW (ref 26.0–34.0)
MCHC: 30.7 g/dL (ref 30.0–36.0)
MCV: 72.7 fL — ABNORMAL LOW (ref 78.0–100.0)
Platelets: 147 10*3/uL — ABNORMAL LOW (ref 150–400)
RBC: 2.78 MIL/uL — ABNORMAL LOW (ref 3.87–5.11)
RDW: 17.9 % — ABNORMAL HIGH (ref 11.5–15.5)
WBC: 10.3 10*3/uL (ref 4.0–10.5)

## 2018-02-02 LAB — BASIC METABOLIC PANEL
Anion gap: 9 (ref 5–15)
BUN: 6 mg/dL (ref 6–20)
CO2: 23 mmol/L (ref 22–32)
Calcium: 8.3 mg/dL — ABNORMAL LOW (ref 8.9–10.3)
Chloride: 107 mmol/L (ref 101–111)
Creatinine, Ser: 0.65 mg/dL (ref 0.44–1.00)
GFR calc Af Amer: >60 mL/min (ref >60)
GFR calc non Af Amer: >60 mL/min (ref >60)
Glucose, Bld: 95 mg/dL (ref 65–99)
Potassium: 4 mmol/L (ref 3.5–5.1)
Sodium: 139 mmol/L (ref 135–145)

## 2018-02-02 LAB — ABO/RH: ABO/RH(D): O POS

## 2018-02-02 LAB — HIV ANTIBODY (ROUTINE TESTING W REFLEX): HIV Screen 4th Generation wRfx: NONREACTIVE

## 2018-02-02 LAB — FERRITIN: Ferritin: 71 ng/mL (ref 11–307)

## 2018-02-02 LAB — PREPARE RBC (CROSSMATCH)

## 2018-02-02 MED ORDER — AZITHROMYCIN 250 MG PO TABS
1000.0000 mg | ORAL_TABLET | Freq: Once | ORAL | Status: AC
  Administered 2018-02-03: 1000 mg via ORAL
  Filled 2018-02-02: qty 4

## 2018-02-02 MED ORDER — LIDOCAINE HCL 1 % IJ SOLN
INTRAMUSCULAR | Status: AC
Start: 2018-02-02 — End: 2018-02-03
  Filled 2018-02-02: qty 20

## 2018-02-02 MED ORDER — CEFTRIAXONE SODIUM 250 MG IJ SOLR
250.0000 mg | Freq: Once | INTRAMUSCULAR | Status: AC
  Administered 2018-02-02: 250 mg via INTRAMUSCULAR
  Filled 2018-02-02 (×2): qty 250

## 2018-02-02 MED ORDER — METRONIDAZOLE IN NACL 5-0.79 MG/ML-% IV SOLN
500.0000 mg | Freq: Three times a day (TID) | INTRAVENOUS | Status: DC
  Administered 2018-02-02 – 2018-02-06 (×11): 500 mg via INTRAVENOUS
  Filled 2018-02-02 (×13): qty 100

## 2018-02-02 MED ORDER — SODIUM CHLORIDE 0.9 % IV SOLN: Freq: Once | INTRAVENOUS | Status: DC

## 2018-02-02 MED ORDER — HYDROMORPHONE HCL 1 MG/ML IJ SOLN
1.0000 mg | INTRAMUSCULAR | Status: DC | PRN
  Administered 2018-02-02 (×3): 2 mg via INTRAVENOUS
  Administered 2018-02-02: 1 mg via INTRAVENOUS
  Administered 2018-02-02 – 2018-02-03 (×5): 2 mg via INTRAVENOUS
  Administered 2018-02-03: 1 mg via INTRAVENOUS
  Administered 2018-02-03 – 2018-02-05 (×15): 2 mg via INTRAVENOUS
  Administered 2018-02-05: 1 mg via INTRAVENOUS
  Administered 2018-02-05 – 2018-02-06 (×6): 2 mg via INTRAVENOUS
  Filled 2018-02-02 (×17): qty 2
  Filled 2018-02-02: qty 1
  Filled 2018-02-02 (×16): qty 2

## 2018-02-02 NOTE — Progress Notes (Signed)
CSW met with pt.  Pt's son left with family (a cousin) earlier in the day at approx 4pm and will stay with cousin until pt D/C's.    CSW provided pt with resources for [employer] and actively listened and provided validation for pt's concerns for her son.  Pt was appreciative and thanked the CSW.  Please reconsult if future social work needs arise.  CSW signing off, as social work intervention is no longer needed.  Alphonse Guild. Jonathon Castelo, LCSW, LCAS, CSI Clinical Social Worker Ph: [phone]

## 2018-02-02 NOTE — Progress Notes (Signed)
Patient's son was dropped off around 9:30p and is in patient's room. Patient says she doesn't have anyone to look after her son. The house supervisor was called, he said her son could sleep here but to tell her to call someone incase she has to have surgery or a procedure so someone could pick the boy. Patient was notified and she said she will call someone.

## 2018-02-02 NOTE — Progress Notes (Signed)
Patient has orders to have RBC's transfused. Night nurse requested patient to sign consent form and she refused "until I speak to a doctor." MD paged and aware. Will continue to monitor.

## 2018-02-02 NOTE — Progress Notes (Signed)
LCSW attempted to met with patient regarding childcare.   Law enforcement was entering the room when LCSW attempted to met with patient.   LCSW will follow up later.   Carolin Coy [place]

## 2018-02-02 NOTE — Progress Notes (Signed)
Spoke to pharmacy about scheduling of Flagyl in combination of the 2 units of blood. Pushed back administration time of Flagyl per pharmacy recommendation.

## 2018-02-02 NOTE — Progress Notes (Signed)
Intra-abdominal abscess (HCC)  Subjective: Pt with severe pelvic pain.    Objective: Vital signs in last 24 hours: Temp:  [99 F (37.2 C)-99.9 F (37.7 C)] 99.1 F (37.3 C) (02/22 0630) Pulse Rate:  [74-119] 83 (02/22 0630) Resp:  [15-18] 18 (02/22 0630) BP: (113-187)/(64-149) 132/73 (02/22 0630) SpO2:  [93 %-100 %] 99 % (02/22 0630) Weight:  [67.4 kg (148 lb 8 oz)] 67.4 kg (148 lb 8 oz) (02/21 2151) Last BM Date: 01/29/18  Intake/Output from previous day: 02/21 0701 - 02/22 0700 In: 1211.3 [I.V.:711.3; IV Piggyback:500] Out: 350 [Urine:350] Intake/Output this shift: No intake/output data recorded.  General appearance: alert and cooperative GI: TTP suprapubically  Lab Results:  Results for orders placed or performed during the hospital encounter of 02/01/18 (from the past 24 hour(s))  Lipase, blood     Status: None   Collection Time: 02/01/18 10:35 AM  Result Value Ref Range   Lipase 20 11 - 51 U/L  Comprehensive metabolic panel     Status: Abnormal   Collection Time: 02/01/18 10:35 AM  Result Value Ref Range   Sodium 136 135 - 145 mmol/L   Potassium 3.4 (L) 3.5 - 5.1 mmol/L   Chloride 105 101 - 111 mmol/L   CO2 22 22 - 32 mmol/L   Glucose, Bld 94 65 - 99 mg/dL   BUN 7 6 - 20 mg/dL   Creatinine, Ser 0.63 0.44 - 1.00 mg/dL   Calcium 8.5 (L) 8.9 - 10.3 mg/dL   Total Protein 7.5 6.5 - 8.1 g/dL   Albumin 3.5 3.5 - 5.0 g/dL   AST 28 15 - 41 U/L   ALT 18 14 - 54 U/L   Alkaline Phosphatase 113 38 - 126 U/L   Total Bilirubin 0.7 0.3 - 1.2 mg/dL   GFR calc non Af Amer >60 >60 mL/min   GFR calc Af Amer >60 >60 mL/min   Anion gap 9 5 - 15  CBC     Status: Abnormal   Collection Time: 02/01/18 10:35 AM  Result Value Ref Range   WBC 12.9 (H) 4.0 - 10.5 K/uL   RBC 3.32 (L) 3.87 - 5.11 MIL/uL   Hemoglobin 7.2 (L) 12.0 - 15.0 g/dL   HCT 24.7 (L) 36.0 - 46.0 %   MCV 74.4 (L) 78.0 - 100.0 fL   MCH 21.7 (L) 26.0 - 34.0 pg   MCHC 29.1 (L) 30.0 - 36.0 g/dL   RDW 18.1 (H)  11.5 - 15.5 %   Platelets 177 150 - 400 K/uL  hCG, quantitative, pregnancy     Status: None   Collection Time: 02/01/18 10:35 AM  Result Value Ref Range   hCG, Beta Chain, Quant, S <1 <5 mIU/mL  I-Stat beta hCG blood, ED     Status: Abnormal   Collection Time: 02/01/18 10:47 AM  Result Value Ref Range   I-stat hCG, quantitative 20.4 (H) <5 mIU/mL   Comment 3          I-Stat beta hCG blood, ED ([facility], [facility], [facility] only)     Status: Abnormal   Collection Time: 02/01/18 12:17 PM  Result Value Ref Range   I-stat hCG, quantitative 22.4 (H) <5 mIU/mL   Comment 3          Urinalysis, Routine w reflex microscopic     Status: Abnormal   Collection Time: 02/01/18  4:17 PM  Result Value Ref Range   Color, Urine YELLOW YELLOW   APPearance CLEAR CLEAR   Specific  Gravity, Urine 1.017 1.005 - 1.030   pH 6.0 5.0 - 8.0   Glucose, UA NEGATIVE NEGATIVE mg/dL   Hgb urine dipstick MODERATE (A) NEGATIVE   Bilirubin Urine NEGATIVE NEGATIVE   Ketones, ur NEGATIVE NEGATIVE mg/dL   Protein, ur NEGATIVE NEGATIVE mg/dL   Nitrite NEGATIVE NEGATIVE   Leukocytes, UA NEGATIVE NEGATIVE   RBC / HPF 0-5 0 - 5 RBC/hpf   WBC, UA 0-5 0 - 5 WBC/hpf   Bacteria, UA NONE SEEN NONE SEEN   Squamous Epithelial / LPF 0-5 (A) NONE SEEN  Basic metabolic panel     Status: Abnormal   Collection Time: 02/02/18  4:39 AM  Result Value Ref Range   Sodium 139 135 - 145 mmol/L   Potassium 4.0 3.5 - 5.1 mmol/L   Chloride 107 101 - 111 mmol/L   CO2 23 22 - 32 mmol/L   Glucose, Bld 95 65 - 99 mg/dL   BUN 6 6 - 20 mg/dL   Creatinine, Ser 0.65 0.44 - 1.00 mg/dL   Calcium 8.3 (L) 8.9 - 10.3 mg/dL   GFR calc non Af Amer >60 >60 mL/min   GFR calc Af Amer >60 >60 mL/min   Anion gap 9 5 - 15  CBC     Status: Abnormal   Collection Time: 02/02/18  4:39 AM  Result Value Ref Range   WBC 10.3 4.0 - 10.5 K/uL   RBC 2.78 (L) 3.87 - 5.11 MIL/uL   Hemoglobin 6.2 (LL) 12.0 - 15.0 g/dL   HCT 20.2 (L) 36.0 - 46.0 %   MCV 72.7 (L) 78.0 - 100.0 fL    MCH 22.3 (L) 26.0 - 34.0 pg   MCHC 30.7 30.0 - 36.0 g/dL   RDW 17.9 (H) 11.5 - 15.5 %   Platelets 147 (L) 150 - 400 K/uL  Iron and TIBC     Status: Abnormal   Collection Time: 02/02/18  4:39 AM  Result Value Ref Range   Iron 8 (L) 28 - 170 ug/dL   TIBC 276 250 - 450 ug/dL   Saturation Ratios 3 (L) 10.4 - 31.8 %   UIBC 268 ug/dL  Ferritin     Status: None   Collection Time: 02/02/18  4:39 AM  Result Value Ref Range   Ferritin 71 11 - 307 ng/mL  Type and screen [place]     Status: None (Preliminary result)   Collection Time: 02/02/18  6:51 AM  Result Value Ref Range   ABO/RH(D) O POS    Antibody Screen NEG    Sample Expiration      02/05/2018 Performed at [facility], [place] [address], [place], [place] [address]    Unit Number [id number]    Blood Component Type RED CELLS,LR    Unit division 00    Status of Unit ALLOCATED    Transfusion Status OK TO TRANSFUSE    Crossmatch Result Compatible    Unit Number [id number]    Blood Component Type RED CELLS,LR    Unit division 00    Status of Unit ALLOCATED    Transfusion Status OK TO TRANSFUSE    Crossmatch Result Compatible   ABO/Rh     Status: None   Collection Time: 02/02/18  6:51 AM  Result Value Ref Range   ABO/RH(D)      O POS Performed at [facility], [place] [address] [place], [place] [address]   Prepare RBC     Status: None  Collection Time: 02/02/18  6:58 AM  Result Value Ref Range   Order Confirmation      ORDER PROCESSED BY BLOOD BANK Performed at [facility], [place] [address] [place], [place] [address]      Studies/Results Radiology     MEDS, Scheduled . ferrous sulfate  325 mg Oral Q breakfast     Assessment: Intra-abdominal abscess (HCC) CT and history not convincing for appendicitis.    Plan: COnt IV Abx Will await Gyn consult   LOS: 1 day    Rosario Adie, MD [facility],  [place]   02/02/2018 9:08 AM

## 2018-02-02 NOTE — Consult Note (Signed)
[facility] Faculty Practice OB/GYN Attending Consult Note   Consult Date: 02/02/2018  Reason for Consult: Pelvic Inflammatory Disease/Tuboovarian Abscess Referring Physician: Dr. Bonnielee Haff    Assessment/Plan: Patient with Pelvic Inflammatory Disease/Tuboovarian Abscess/Intraabdominal Abscess - No need for any GYN surgical intervention for now, agree with IR drainage as planned. - Continue Zosyn IV 3.375 mg q8h for now; added Flagyl 500 mg IV q8h to broaden anaerobic coverage (also cover possible Trichomonas infection as she has had this in the past) - Please follow up GC/Chlam as ordered by her primary team; this can be checked from urine sample. - Azithromycin 1000 mg po x 1 and Ceftriaxone 250 mg IM x 1 can be given for presumptive treatment of these pathogens (these orders were placed) - After IR drainage, she can continue IV antibiotics for about 24-48 hours and get discharged to home on a 14 day course of Doxycycline 100 mg po bid and Flagyl 500 mg po bid x 14 days - Follow up appointment has been made at [place] for [facility]) on February 14, 2018 at 3:15 pm. Appreciate care of Ebony Cargo by her primary team Please call [phone] [facility] GYN Attending on call) for worsening symptoms or for any gynecologic concerns. Thank you for involving [place] in the care of this patient.   Verita Schneiders, MD, [place] for [facility], [place] Phone: [phone]   History of Present Illness: Megan CLODFELTER is an 42 y.o. [phone] female with history of chronic pelvic pain, trichomonas infection who was admitted for worsening pelvic pain, leukocytosis.  She was diagnosed with an intraabdominal abscess, with concern about possible appendicitis or PID etiology.  She was started on Zosyn and General Surgery was consulted; they felt this was more  consistent with PID/TOA.  Patient is already scheduled to get drainage of this abscess; our service was consulted for any further input. No current vaginal bleeding or other GYN concerns.   Pertinent OB/GYN History: Patient's last menstrual period was 01/16/2018.  Patient Active Problem List   Diagnosis Date Noted  . Intra-abdominal abscess ([place]) 02/01/2018  . Small bowel obstruction ([place]) 02/29/2016  . Recurrent abdominal pain 02/29/2016  . Chronic anemia 02/29/2016  . Chronic constipation 02/19/2016  . Narcotic dependence ([place]) 02/19/2016  . Ileus ([place]) 02/08/2016  . Chronic narcotic use 02/08/2016  . Anemia, iron deficiency   . Hypokalemia 01/29/2016  . Iron deficiency anemia due to chronic blood loss 01/29/2016  . Anemia 01/29/2016  . Abdominal pain 01/09/2016  . Generalized abdominal pain 01/09/2016  . Nausea with vomiting 01/09/2016  . Tobacco abuse 01/09/2016  . Abdominal pain in female   . Constipation     Past Medical History:  Diagnosis Date  . Anemia   . Cervical disc disease   . Fibroids   . Heart murmur   . Low back pain     Past Surgical History:  Procedure Laterality Date  . ANKLE SURGERY    . CESAREAN SECTION    . TUBAL LIGATION      Family History  Problem Relation Age of Onset  . Diabetes Maternal Grandmother   . Hypertension Maternal Aunt   . Bowel Disease Mother     Social History:  reports that she has been smoking cigarettes.  She has been smoking about 0.50 packs per day. she has never used  smokeless tobacco. She reports that she does not drink alcohol or use drugs.  Allergies: No Known Allergies  Medications:  I have reviewed the patient's current medications. Prior to Admission:  Medications Prior to Admission  Medication Sig Dispense Refill Last Dose  . naproxen (NAPROSYN) 500 MG tablet Take 500 mg by mouth 2 (two) times daily.   02/01/2018 at Unknown time  . nitrofurantoin, macrocrystal-monohydrate, (MACROBID) 100 MG capsule Take  100 mg by mouth 2 (two) times daily.   02/01/2018 at Unknown time  . potassium chloride (K-DUR) 10 MEQ tablet Take 10 mEq by mouth 2 (two) times daily.   02/01/2018 at Unknown time   Scheduled: . [START ON 02/03/2018] azithromycin  1,000 mg Oral Once  . cefTRIAXone (ROCEPHIN) IM  250 mg Intramuscular Once   Continuous: . sodium chloride 75 mL/hr at 02/01/18 2031  . sodium chloride    . metronidazole    . piperacillin-tazobactam (ZOSYN)  IV 3.375 g (02/02/18 8338)   SNK:[id number], HYDROmorphone (DILAUDID) injection, ketorolac Anti-infectives (From admission, onward)   Start     Dose/Rate Route Frequency Ordered Stop   02/03/18 1000  azithromycin (ZITHROMAX) tablet 1,000 mg     1,000 mg Oral  Once 02/02/18 1101     02/02/18 1200  metroNIDAZOLE (FLAGYL) IVPB 500 mg     500 mg 100 mL/hr over 60 Minutes Intravenous Every 8 hours 02/02/18 1045     02/02/18 1115  cefTRIAXone (ROCEPHIN) injection 250 mg     250 mg Intramuscular  Once 02/02/18 1101     02/02/18 0000  piperacillin-tazobactam (ZOSYN) IVPB 3.375 g     3.375 g 12.5 mL/hr over 240 Minutes Intravenous Every 8 hours 02/01/18 1644     02/01/18 1700  piperacillin-tazobactam (ZOSYN) IVPB 3.375 g     3.375 g 100 mL/hr over 30 Minutes Intravenous  Once 02/01/18 1640 02/01/18 1839      Review of Systems: Pertinent items noted in HPI and remainder of comprehensive ROS otherwise negative.  Physical Examination BP 132/73 (BP Location: Right Arm)   Pulse 83   Temp 99.1 F (37.3 C) (Oral)   Resp 18   Ht 5' 4"  (1.626 m)   Wt 148 lb 8 oz (67.4 kg)   LMP 01/16/2018   SpO2 99%   BMI 25.49 kg/m  Deferred as consultation was done over the phone.  Results for orders placed or performed during the hospital encounter of 02/01/18 (from the past 72 hour(s))  Lipase, blood     Status: None   Collection Time: 02/01/18 10:35 AM  Result Value Ref Range   Lipase 20 11 - 51 U/L    Comment: Performed at [facility], [place] [address] [place], [place] [address]  Comprehensive metabolic panel     Status: Abnormal   Collection Time: 02/01/18 10:35 AM  Result Value Ref Range   Sodium 136 135 - 145 mmol/L   Potassium 3.4 (L) 3.5 - 5.1 mmol/L   Chloride 105 101 - 111 mmol/L   CO2 22 22 - 32 mmol/L   Glucose, Bld 94 65 - 99 mg/dL   BUN 7 6 - 20 mg/dL   Creatinine, Ser 0.63 0.44 - 1.00 mg/dL   Calcium 8.5 (L) 8.9 - 10.3 mg/dL   Total Protein 7.5 6.5 - 8.1 g/dL   Albumin 3.5 3.5 - 5.0 g/dL   AST 28 15 - 41 U/L   ALT 18 14 - 54 U/L   Alkaline Phosphatase 113 38 -  126 U/L   Total Bilirubin 0.7 0.3 - 1.2 mg/dL   GFR calc non Af Amer >60 >60 mL/min   GFR calc Af Amer >60 >60 mL/min    Comment: (NOTE) The eGFR has been calculated using the CKD EPI equation. This calculation has not been validated in all clinical situations. eGFR's persistently <60 mL/min signify possible Chronic Kidney Disease.    Anion gap 9 5 - 15    Comment: Performed at [facility], [place] [address], [place], [place] [address]  CBC     Status: Abnormal   Collection Time: 02/01/18 10:35 AM  Result Value Ref Range   WBC 12.9 (H) 4.0 - 10.5 K/uL   RBC 3.32 (L) 3.87 - 5.11 MIL/uL   Hemoglobin 7.2 (L) 12.0 - 15.0 g/dL   HCT 24.7 (L) 36.0 - 46.0 %   MCV 74.4 (L) 78.0 - 100.0 fL   MCH 21.7 (L) 26.0 - 34.0 pg   MCHC 29.1 (L) 30.0 - 36.0 g/dL   RDW 18.1 (H) 11.5 - 15.5 %   Platelets 177 150 - 400 K/uL    Comment: Performed at [facility], [place] [address] [place], [place] [address]  hCG, quantitative, pregnancy     Status: None   Collection Time: 02/01/18 10:35 AM  Result Value Ref Range   hCG, Beta Chain, Quant, S <1 <5 mIU/mL    Comment: Performed at [facility], [place] [address], [place], [place] [address]  I-Stat beta hCG blood, ED     Status: Abnormal   Collection Time: 02/01/18 10:47 AM  Result Value Ref Range   I-stat hCG, quantitative 20.4 (H) <5 mIU/mL   Comment 3              Comment:   GEST. AGE      CONC.  (mIU/mL)   <=1 WEEK        5 - 50     2 WEEKS       50 - 500     3 WEEKS       100 - 10,000     4 WEEKS     1,000 - 30,000        FEMALE AND NON-PREGNANT FEMALE:     LESS THAN 5 mIU/mL   I-Stat beta hCG blood, ED ([facility], [facility], [facility] only)     Status: Abnormal   Collection Time: 02/01/18 12:17 PM  Result Value Ref Range   I-stat hCG, quantitative 22.4 (H) <5 mIU/mL   Comment 3            Comment:   GEST. AGE      CONC.  (mIU/mL)   <=1 WEEK        5 - 50     2 WEEKS       50 - 500     3 WEEKS       100 - 10,000     4 WEEKS     1,000 - 30,000        FEMALE AND NON-PREGNANT FEMALE:     LESS THAN 5 mIU/mL   Urinalysis, Routine w reflex microscopic     Status: Abnormal   Collection Time: 02/01/18  4:17 PM  Result Value Ref Range   Color, Urine YELLOW YELLOW   APPearance CLEAR CLEAR   Specific Gravity, Urine 1.017 1.005 - 1.030   pH 6.0 5.0 - 8.0   Glucose, UA NEGATIVE NEGATIVE mg/dL   Hgb  urine dipstick MODERATE (A) NEGATIVE   Bilirubin Urine NEGATIVE NEGATIVE   Ketones, ur NEGATIVE NEGATIVE mg/dL   Protein, ur NEGATIVE NEGATIVE mg/dL   Nitrite NEGATIVE NEGATIVE   Leukocytes, UA NEGATIVE NEGATIVE   RBC / HPF 0-5 0 - 5 RBC/hpf   WBC, UA 0-5 0 - 5 WBC/hpf   Bacteria, UA NONE SEEN NONE SEEN   Squamous Epithelial / LPF 0-5 (A) NONE SEEN    Comment: Performed at [facility], [place] [address] [place], [place] [address]  Basic metabolic panel     Status: Abnormal   Collection Time: 02/02/18  4:39 AM  Result Value Ref Range   Sodium 139 135 - 145 mmol/L   Potassium 4.0 3.5 - 5.1 mmol/L   Chloride 107 101 - 111 mmol/L   CO2 23 22 - 32 mmol/L   Glucose, Bld 95 65 - 99 mg/dL   BUN 6 6 - 20 mg/dL   Creatinine, Ser 0.65 0.44 - 1.00 mg/dL   Calcium 8.3 (L) 8.9 - 10.3 mg/dL   GFR calc non Af Amer >60 >60 mL/min   GFR calc Af Amer >60 >60 mL/min    Comment: (NOTE) The eGFR has been calculated using the CKD EPI equation. This calculation  has not been validated in all clinical situations. eGFR's persistently <60 mL/min signify possible Chronic Kidney Disease.    Anion gap 9 5 - 15    Comment: Performed at [facility], [place] [address], [place], [place] [address]  CBC     Status: Abnormal   Collection Time: 02/02/18  4:39 AM  Result Value Ref Range   WBC 10.3 4.0 - 10.5 K/uL   RBC 2.78 (L) 3.87 - 5.11 MIL/uL   Hemoglobin 6.2 (LL) 12.0 - 15.0 g/dL    Comment: CRITICAL RESULT CALLED TO, READ BACK BY AND VERIFIED WITH: A PREZ,RN @0608  02/02/18 MKELLY    HCT 20.2 (L) 36.0 - 46.0 %   MCV 72.7 (L) 78.0 - 100.0 fL   MCH 22.3 (L) 26.0 - 34.0 pg   MCHC 30.7 30.0 - 36.0 g/dL   RDW 17.9 (H) 11.5 - 15.5 %   Platelets 147 (L) 150 - 400 K/uL    Comment: Performed at [facility], [place] [address], [place], [place] [address]  Iron and TIBC     Status: Abnormal   Collection Time: 02/02/18  4:39 AM  Result Value Ref Range   Iron 8 (L) 28 - 170 ug/dL   TIBC 276 250 - 450 ug/dL   Saturation Ratios 3 (L) 10.4 - 31.8 %   UIBC 268 ug/dL    Comment: Performed at [facility], [place] [address], [place], [place] [address]  Ferritin     Status: None   Collection Time: 02/02/18  4:39 AM  Result Value Ref Range   Ferritin 71 11 - 307 ng/mL    Comment: Performed at [place] [address] [place], [place] [address]  Type and screen [place]     Status: None (Preliminary result)   Collection Time: 02/02/18  6:51 AM  Result Value Ref Range   ABO/RH(D) O POS    Antibody Screen NEG    Sample Expiration      02/05/2018 Performed at [facility], [place] [address], [place], [place] [address]    Unit Number [id number]    Blood Component Type RED CELLS,LR    Unit division 00  Status of Unit ALLOCATED    Transfusion Status OK TO TRANSFUSE    Crossmatch Result Compatible    Unit Number [id number]    Blood Component Type RED CELLS,LR    Unit  division 00    Status of Unit ALLOCATED    Transfusion Status OK TO TRANSFUSE    Crossmatch Result Compatible   ABO/Rh     Status: None   Collection Time: 02/02/18  6:51 AM  Result Value Ref Range   ABO/RH(D)      O POS Performed at [facility], [place] [address], [place], [place] [address]   Prepare RBC     Status: None   Collection Time: 02/02/18  6:58 AM  Result Value Ref Range   Order Confirmation      ORDER PROCESSED BY BLOOD BANK Performed at [facility], [place] [address] [place],  [address]      Ct Abdomen Pelvis W Contrast  Result Date: 02/01/2018 CLINICAL DATA:  42 year old female with lower abdominal pain and distension. Vaginal spotting. Negative quantitative beta HCG. EXAM: CT ABDOMEN AND PELVIS WITH CONTRAST TECHNIQUE: Multidetector CT imaging of the abdomen and pelvis was performed using the standard protocol following bolus administration of intravenous contrast. CONTRAST:  81m ISOVUE-300 IOPAMIDOL (ISOVUE-300) INJECTION 61% COMPARISON:  CT Abdomen and Pelvis 02/29/2016, and earlier. FINDINGS: Lower chest: Chronic mild cardiomegaly. No pericardial effusion. No pleural effusion. Increased dependent and bilateral posterior costophrenic angle opacity most resembles atelectasis. Hepatobiliary: Small volume of inferior perihepatic free fluid (series 2, image 39). The liver and gallbladder remain within normal limits. Pancreas: Negative. Spleen: Small volume of free fluid in the anterior left upper quadrant along the anterior aspect of the spleen (series 2, image 13). Splenic enhancement is normal. Adrenals/Urinary Tract: Normal adrenal glands. Both kidneys appear stable since 2017 and within normal limits. There is a right renal midpole simple cyst. Symmetric bilateral renal enhancement and contrast excretion. No hydronephrosis or hydroureter. Unremarkable bladder.  Chronic left pelvic phlebolith is unchanged. Stomach/Bowel: No free air in  the upper abdomen. Negative stomach and duodenum. Oral contrast was administered but has only reached the proximal to mid jejunum. There are distal fluid-filled but nondilated small bowel loops. There is retained low-density stool throughout the ascending colon. The transverse colon is distended with gas to the splenic flexure. The descending colon is more decompressed with a smaller volume of gas and retained stool. The sigmoid colon and rectum are largely decompressed. There is abnormal free fluid versus organizing fluid collection in the right lower quadrant inferior to the cecum and lateral to the appendix. The appendix does not contain much gas throughout its course, and contains hyperdense material instead, and measures at the upper limits of normal to mildly dilated, 7-8 millimeters. However, there is no phlegmon along the course of the appendix. Rather, the abnormal right lower quadrant fluid tracks along the right adnexa and into the cul-de-sac (series 2, image 69). There is associated peritoneal thickening or organization of the fluid. No extraluminal gas is identified. See additional uterus and ovary findings below. Vascular/Lymphatic: Mild Calcified aortic atherosclerosis. Major arterial structures in the abdomen and pelvis are patent. Portal venous system is patent. No lymphadenopathy. Reproductive: Mild uterine enlargement is stable since 2017. A small dystrophic calcification along the anterior fundus is unchanged, without definite associated uterine fibroid. Both adnexa regions are indistinct. The right adnexa region is inseparable from the abnormal right lower quadrant fluid which tracks into the cul-de-sac. On series 2, image  65 there is a 16 millimeter small rim enhancing area of fluid which does not seem related to bowel. Musculoskeletal: Dextroconvex thoracic scoliosis. No acute osseous abnormality identified. IMPRESSION: 1. Scattered free fluid in the abdomen, the largest collection of which is  in the right lower quadrant and appears to track along the right adnexa into the pelvic cul-de-sac. There is associated mild peritoneal or rim enhancement along the course of the dominant collection suggesting this is a developing Abscess. There is more free flowing/reactive appearing fluid in the upper abdomen adjacent to the liver and spleen. 2. The origin of #1 is unclear. Pelvic inflammatory disease (PID) is favored, with the secondary consideration being acute appendicitis. 3. Evidence of associated small and large bowel ileus, but no other discrete bowel inflammation. 4. Bilateral lung base opacity favored to be atelectasis although developing infection would be difficult to exclude. No pleural effusion. 5. Chronic mild cardiomegaly. Electronically Signed   By: Genevie Ann M.D.   On: 02/01/2018 16:21

## 2018-02-02 NOTE — Progress Notes (Signed)
Gyn note reviewed.  Will defer to their management.  Will sign off.  Please call with any questions or concerns.    Rosario Adie, MD  Colorectal and [place] [facility]

## 2018-02-02 NOTE — Progress Notes (Signed)
[facility] PROGRESS NOTE  [name] EXB:[id number] DOB: 01/29/1976 DOA: 02/01/2018  PCP: [facility]  Brief History/Interval Summary: 42 year old female with a past medical history of iron deficiency anemia, menorrhagia, history of small bowel obstruction, history of PID in the past, history of trichomoniasis, chronic pain syndrome presented with abdominal pain and distention for the last 3-4 days.  She underwent CT scan of her abdomen and pelvis which raised concern for intra-abdominal abscess.  Patient was hospitalized for further management.  Reason for Visit: Intra-abdominal abscess  Consultants: General surgery.  GYN.  Interventional radiology  Procedures: None yet  Antibiotics: Zosyn Ceftriaxone Flagyl  Subjective/Interval History: Patient continues to have 8 out of 10 pain in the lower abdomen.  Some nausea but no vomiting.  Denies any chest pain or shortness of breath.  Admits to some vaginal spotting but no significant bleeding.  ROS: Denies any headaches.  Objective:  Vital Signs  Vitals:   02/02/18 0234 02/02/18 0630 02/02/18 1130 02/02/18 1159  BP:  132/73 (!) 116/58 (!) 116/59  Pulse:  83 69 66  Resp:  18 18 18   Temp: 99 F (37.2 C) 99.1 F (37.3 C) 99.4 F (37.4 C) 99.2 F (37.3 C)  TempSrc: Oral Oral Oral Oral  SpO2:  99% 100% 100%  Weight:      Height:        Intake/Output Summary (Last 24 hours) at 02/02/2018 1301 Last data filed at 02/02/2018 1144 Gross per 24 hour  Intake 1528.25 ml  Output 350 ml  Net 1178.25 ml   Filed Weights   02/01/18 2151  Weight: 67.4 kg (148 lb 8 oz)    General appearance: alert, cooperative, appears stated age and no distress Head: Normocephalic, without obvious abnormality, atraumatic Resp: clear to auscultation bilaterally Cardio: regular rate and rhythm, S1, S2 normal, no murmur, click, rub or gallop GI: Abdomen is soft.  Tender to palpation diffusely without any rebound rigidity.  Mild  guarding.  No masses or organ sounds are present. Extremities: extremities normal, atraumatic, no cyanosis or edema Neurologic: No focal deficits.  Lab Results:  Data Reviewed: I have personally reviewed following labs and imaging studies  CBC: Recent Labs  Lab 02/01/18 1035 02/02/18 0439  WBC 12.9* 10.3  HGB 7.2* 6.2*  HCT 24.7* 20.2*  MCV 74.4* 72.7*  PLT 177 147*    Basic Metabolic Panel: Recent Labs  Lab 02/01/18 1035 02/02/18 0439  NA 136 139  K 3.4* 4.0  CL 105 107  CO2 22 23  GLUCOSE 94 95  BUN 7 6  CREATININE 0.63 0.65  CALCIUM 8.5* 8.3*    GFR: Estimated Creatinine Clearance: 87.4 mL/min (by C-G formula based on SCr of 0.65 mg/dL).  Liver Function Tests: Recent Labs  Lab 02/01/18 1035  AST 28  ALT 18  ALKPHOS 113  BILITOT 0.7  PROT 7.5  ALBUMIN 3.5    Recent Labs  Lab 02/01/18 1035  LIPASE 20   Anemia Panel: Recent Labs    02/02/18 0439  FERRITIN 71  TIBC 276  IRON 8*      Radiology Studies: Ct Abdomen Pelvis W Contrast  Result Date: 02/01/2018 CLINICAL DATA:  42 year old female with lower abdominal pain and distension. Vaginal spotting. Negative quantitative beta HCG. EXAM: CT ABDOMEN AND PELVIS WITH CONTRAST TECHNIQUE: Multidetector CT imaging of the abdomen and pelvis was performed using the standard protocol following bolus administration of intravenous contrast. CONTRAST:  53mL ISOVUE-300 IOPAMIDOL (ISOVUE-300) INJECTION 61% COMPARISON:  CT  Abdomen and Pelvis 02/29/2016, and earlier. FINDINGS: Lower chest: Chronic mild cardiomegaly. No pericardial effusion. No pleural effusion. Increased dependent and bilateral posterior costophrenic angle opacity most resembles atelectasis. Hepatobiliary: Small volume of inferior perihepatic free fluid (series 2, image 39). The liver and gallbladder remain within normal limits. Pancreas: Negative. Spleen: Small volume of free fluid in the anterior left upper quadrant along the anterior aspect of the  spleen (series 2, image 13). Splenic enhancement is normal. Adrenals/Urinary Tract: Normal adrenal glands. Both kidneys appear stable since 2017 and within normal limits. There is a right renal midpole simple cyst. Symmetric bilateral renal enhancement and contrast excretion. No hydronephrosis or hydroureter. Unremarkable bladder.  Chronic left pelvic phlebolith is unchanged. Stomach/Bowel: No free air in the upper abdomen. Negative stomach and duodenum. Oral contrast was administered but has only reached the proximal to mid jejunum. There are distal fluid-filled but nondilated small bowel loops. There is retained low-density stool throughout the ascending colon. The transverse colon is distended with gas to the splenic flexure. The descending colon is more decompressed with a smaller volume of gas and retained stool. The sigmoid colon and rectum are largely decompressed. There is abnormal free fluid versus organizing fluid collection in the right lower quadrant inferior to the cecum and lateral to the appendix. The appendix does not contain much gas throughout its course, and contains hyperdense material instead, and measures at the upper limits of normal to mildly dilated, 7-8 millimeters. However, there is no phlegmon along the course of the appendix. Rather, the abnormal right lower quadrant fluid tracks along the right adnexa and into the cul-de-sac (series 2, image 69). There is associated peritoneal thickening or organization of the fluid. No extraluminal gas is identified. See additional uterus and ovary findings below. Vascular/Lymphatic: Mild Calcified aortic atherosclerosis. Major arterial structures in the abdomen and pelvis are patent. Portal venous system is patent. No lymphadenopathy. Reproductive: Mild uterine enlargement is stable since 2017. A small dystrophic calcification along the anterior fundus is unchanged, without definite associated uterine fibroid. Both adnexa regions are indistinct. The  right adnexa region is inseparable from the abnormal right lower quadrant fluid which tracks into the cul-de-sac. On series 2, image 65 there is a 16 millimeter small rim enhancing area of fluid which does not seem related to bowel. Musculoskeletal: Dextroconvex thoracic scoliosis. No acute osseous abnormality identified. IMPRESSION: 1. Scattered free fluid in the abdomen, the largest collection of which is in the right lower quadrant and appears to track along the right adnexa into the pelvic cul-de-sac. There is associated mild peritoneal or rim enhancement along the course of the dominant collection suggesting this is a developing Abscess. There is more free flowing/reactive appearing fluid in the upper abdomen adjacent to the liver and spleen. 2. The origin of #1 is unclear. Pelvic inflammatory disease (PID) is favored, with the secondary consideration being acute appendicitis. 3. Evidence of associated small and large bowel ileus, but no other discrete bowel inflammation. 4. Bilateral lung base opacity favored to be atelectasis although developing infection would be difficult to exclude. No pleural effusion. 5. Chronic mild cardiomegaly. Electronically Signed   By: Genevie Ann M.D.   On: 02/01/2018 16:21     Medications:  Scheduled: . [START ON 02/03/2018] azithromycin  1,000 mg Oral Once  . cefTRIAXone (ROCEPHIN) IM  250 mg Intramuscular Once   Continuous: . sodium chloride 75 mL/hr at 02/01/18 2031  . sodium chloride    . metronidazole    . piperacillin-tazobactam (ZOSYN)  IV Stopped (02/02/18 1027)   DGU:[id number], HYDROmorphone (DILAUDID) injection, ketorolac  Assessment/Plan:  Principal Problem:   Intra-abdominal abscess (HCC) Active Problems:   Tobacco abuse   Hypokalemia   Iron deficiency anemia due to chronic blood loss   Chronic narcotic use    Intra-abdominal abscess Etiology unclear but could be due to pelvic inflammatory disease.  She has had this previously as well.   She was found to have an abscess near the cervical area back in March 2018 based on care everywhere reports.  It appears that this was treated at the [facility].  Based on CT scan findings done at the time of admission general surgery was consulted.  Interventional radiology consulted for aspiration.  Also discussed with gynecology.  They made recommendations regarding antibiotics.  They did not recommend any kind of GYN procedures at this time.  Will wait for aspiration and if cultures are positive then she can get directed treatment.  Currently she is on Zosyn.  GYN has recommended ceftriaxone, azithromycin and Flagyl as well.  Follow-up on GC chlamydia.  Patient does have a history of trichomoniasis.  Possible pelvic inflammatory disease Discussion above.  Patient has been made an appointment at [facility] on March 6 at 3:15 PM.  Ileus Noted on CT scan.  Does not have any nausea vomiting currently.  Main symptom is pain.  Continue to leave n.p.o. for now make try clear liquids later today.  Iron deficiency anemia Hemoglobin noted to be 6.2 today.  2 units of blood has been ordered for transfusion.  Anemia panel shows iron of 8.  Ferritin 71.  She may benefit from iron infusions which can be given tomorrow.  She would benefit from oral iron supplementation.  Suspect she is noncompliant.  She does have a history of menorrhagia which is the reason for her anemia.  History of menorrhagia Some vaginal spotting has been noted but patient denies any significant vaginal bleeding.  Some spotting could be due to pelvic inflammatory disease.  We will continue to watch for now.  Serum beta-hCG less than 1.  Recent urinary tract infection Recently seen at another facility and started on nitrofurantoin.  UA done here in the hospital does not suggest infection.  Hypokalemia Corrected.   DVT Prophylaxis: SCDs    Code Status: Full code Family Communication: Discussed with the  patient Disposition Plan: Management as outlined above.    LOS: 1 day   [employer] [facility] Pager [phone] 02/02/2018, 1:01 PM  If 7PM-7AM, please contact night-coverage at [email or web address], password [facility]

## 2018-02-02 NOTE — Progress Notes (Signed)
CRITICAL VALUE ALERT  Critical Value:  Hgb 6.2  Date & Time Notied:  2/22  0630  Provider Notified: Bodenheimer  Orders Received/Actions taken: Waiting on orders

## 2018-02-02 NOTE — Progress Notes (Signed)
Patient ID: [name], female   DOB: 1976/04/02, 42 y.o.   MRN: [id number] Request received for poss aspiration of abd/pelvic fluid collection in pt. Imaging reviewed by Dr. Laurence Ferrari. Will start with paracentesis first and submit fluid for culture. If pt does not improve with additional antibiotic therapy, recommend f/u imaging next week and possible drain placement if necessary.

## 2018-02-02 NOTE — Progress Notes (Signed)
Spoke with patient regarding having 42 year old child at bedside.  Patient states that she had no adult to come take custody of the child, that someone may be available tomorrow.  Explained to patient that we are currently under flu restrictions, and that our concern is for his safety, especially in the case that her health was to decline.  Spoke with Aloha Gell with risk management who suggested to have social work come and speak with the patient about involving CPS.  Joelene Millin in social work made aware, and stated that someone will be sent to come and speak with the patient about this process.  Will continue to follow.

## 2018-02-03 ENCOUNTER — Inpatient Hospital Stay (HOSPITAL_COMMUNITY): Payer: Medicaid Other

## 2018-02-03 LAB — BASIC METABOLIC PANEL
Anion gap: 12 (ref 5–15)
BUN: 9 mg/dL (ref 6–20)
CO2: 21 mmol/L — ABNORMAL LOW (ref 22–32)
Calcium: 8.3 mg/dL — ABNORMAL LOW (ref 8.9–10.3)
Chloride: 105 mmol/L (ref 101–111)
Creatinine, Ser: 0.59 mg/dL (ref 0.44–1.00)
GFR calc Af Amer: >60 mL/min (ref >60)
GFR calc non Af Amer: >60 mL/min (ref >60)
Glucose, Bld: 105 mg/dL — ABNORMAL HIGH (ref 65–99)
Potassium: 3.6 mmol/L (ref 3.5–5.1)
Sodium: 138 mmol/L (ref 135–145)

## 2018-02-03 LAB — URINE CULTURE: Culture: <10000 — AB

## 2018-02-03 LAB — CBC
HCT: 25.5 % — ABNORMAL LOW (ref 36.0–46.0)
Hemoglobin: 8 g/dL — ABNORMAL LOW (ref 12.0–15.0)
MCH: 24 pg — ABNORMAL LOW (ref 26.0–34.0)
MCHC: 31.4 g/dL (ref 30.0–36.0)
MCV: 76.3 fL — ABNORMAL LOW (ref 78.0–100.0)
Platelets: 122 10*3/uL — ABNORMAL LOW (ref 150–400)
RBC: 3.34 MIL/uL — ABNORMAL LOW (ref 3.87–5.11)
RDW: 19.8 % — ABNORMAL HIGH (ref 11.5–15.5)
WBC: 6.4 10*3/uL (ref 4.0–10.5)

## 2018-02-03 LAB — TYPE AND SCREEN
ABO/RH(D): O POS
Antibody Screen: NEGATIVE
Unit division: 0
Unit division: 0

## 2018-02-03 LAB — BPAM RBC
Blood Product Expiration Date: 201903272359
Blood Product Expiration Date: 201903272359
ISSUE DATE / TIME: 201902221123
ISSUE DATE / TIME: 201902221923
Unit Type and Rh: 5100
Unit Type and Rh: 5100

## 2018-02-03 MED ORDER — ONDANSETRON HCL 4 MG/2ML IJ SOLN
INTRAMUSCULAR | Status: AC
  Filled 2018-02-03: qty 2

## 2018-02-03 MED ORDER — SODIUM CHLORIDE 0.9 % IJ SOLN
INTRAMUSCULAR | Status: AC
  Filled 2018-02-03: qty 50

## 2018-02-03 MED ORDER — ONDANSETRON HCL 4 MG/2ML IJ SOLN
4.0000 mg | Freq: Once | INTRAMUSCULAR | Status: AC
  Administered 2018-02-03: 4 mg via INTRAVENOUS

## 2018-02-03 MED ORDER — IOPAMIDOL (ISOVUE-300) INJECTION 61%
INTRAVENOUS | Status: AC
  Administered 2018-02-03: 100 mL
  Filled 2018-02-03: qty 100

## 2018-02-03 MED ORDER — LIDOCAINE HCL 1 % IJ SOLN
INTRAMUSCULAR | Status: AC
  Filled 2018-02-03: qty 20

## 2018-02-03 NOTE — Progress Notes (Signed)
[facility] PROGRESS NOTE  [name] HAL:[id number] DOB: October 07, 1976 DOA: 02/01/2018  PCP: [facility]  Brief History/Interval Summary: 42 year old female with a past medical history of iron deficiency anemia, menorrhagia, history of small bowel obstruction, history of PID in the past, history of trichomoniasis, chronic pain syndrome presented with abdominal pain and distention for the last 3-4 days.  She underwent CT scan of her abdomen and pelvis which raised concern for intra-abdominal abscess.  Patient was hospitalized for further management.  Reason for Visit: Intra-abdominal abscess  Consultants: General surgery.  GYN.  Interventional radiology  Procedures: Plan is for ultrasound-guided fluid aspiration today  Antibiotics: Zosyn Ceftriaxone Flagyl  Subjective/Interval History: Patient continues to have 9 out of 10 pain in the abdomen.  However Dilaudid seems to be working better than Morphine.  Some nausea but no vomiting.  Tolerating her diet.  No further vaginal spotting or bleeding.  ROS: Denies any headaches.  Objective:  Vital Signs  Vitals:   02/02/18 2000 02/02/18 2230 02/03/18 0415 02/03/18 0920  BP: 114/72 111/68 109/62   Pulse: 98 84 67   Resp: 18 20 18 18   Temp: 97.8 F (36.6 C) 98.9 F (37.2 C) 98.7 F (37.1 C)   TempSrc: Oral Oral Oral   SpO2: 100% 100% 100%   Weight:      Height:        Intake/Output Summary (Last 24 hours) at 02/03/2018 1309 Last data filed at 02/03/2018 1308 Gross per 24 hour  Intake 1900 ml  Output 1325 ml  Net 575 ml   Filed Weights   02/01/18 2151  Weight: 67.4 kg (148 lb 8 oz)    General appearance: Awake alert.  In no distress. Resp: Clear to auscultation bilaterally Cardio: S1-S2 is normal regular. No rubs murmurs of bruit. GI: Mild guarding is appreciated.  Abdomen remains tender to palpation without any rebound bowel sounds are present.  No masses organomegaly.  Extremities: extremities normal,  atraumatic, no cyanosis or edema Neurologic: No focal deficits.  Lab Results:  Data Reviewed: I have personally reviewed following labs and imaging studies  CBC: Recent Labs  Lab 02/01/18 1035 02/02/18 0439 02/03/18 0523  WBC 12.9* 10.3 6.4  HGB 7.2* 6.2* 8.0*  HCT 24.7* 20.2* 25.5*  MCV 74.4* 72.7* 76.3*  PLT 177 147* 122*    Basic Metabolic Panel: Recent Labs  Lab 02/01/18 1035 02/02/18 0439 02/03/18 0523  NA 136 139 138  K 3.4* 4.0 3.6  CL 105 107 105  CO2 22 23 21*  GLUCOSE 94 95 105*  BUN 7 6 9   CREATININE 0.63 0.65 0.59  CALCIUM 8.5* 8.3* 8.3*    GFR: Estimated Creatinine Clearance: 87.4 mL/min (by C-G formula based on SCr of 0.59 mg/dL).  Liver Function Tests: Recent Labs  Lab 02/01/18 1035  AST 28  ALT 18  ALKPHOS 113  BILITOT 0.7  PROT 7.5  ALBUMIN 3.5    Recent Labs  Lab 02/01/18 1035  LIPASE 20   Anemia Panel: Recent Labs    02/02/18 0439  FERRITIN 71  TIBC 276  IRON 8*      Radiology Studies: Ct Abdomen Pelvis W Contrast  Result Date: 02/01/2018 CLINICAL DATA:  42 year old female with lower abdominal pain and distension. Vaginal spotting. Negative quantitative beta HCG. EXAM: CT ABDOMEN AND PELVIS WITH CONTRAST TECHNIQUE: Multidetector CT imaging of the abdomen and pelvis was performed using the standard protocol following bolus administration of intravenous contrast. CONTRAST:  68mL ISOVUE-300 IOPAMIDOL (ISOVUE-300)  INJECTION 61% COMPARISON:  CT Abdomen and Pelvis 02/29/2016, and earlier. FINDINGS: Lower chest: Chronic mild cardiomegaly. No pericardial effusion. No pleural effusion. Increased dependent and bilateral posterior costophrenic angle opacity most resembles atelectasis. Hepatobiliary: Small volume of inferior perihepatic free fluid (series 2, image 39). The liver and gallbladder remain within normal limits. Pancreas: Negative. Spleen: Small volume of free fluid in the anterior left upper quadrant along the anterior aspect of  the spleen (series 2, image 13). Splenic enhancement is normal. Adrenals/Urinary Tract: Normal adrenal glands. Both kidneys appear stable since 2017 and within normal limits. There is a right renal midpole simple cyst. Symmetric bilateral renal enhancement and contrast excretion. No hydronephrosis or hydroureter. Unremarkable bladder.  Chronic left pelvic phlebolith is unchanged. Stomach/Bowel: No free air in the upper abdomen. Negative stomach and duodenum. Oral contrast was administered but has only reached the proximal to mid jejunum. There are distal fluid-filled but nondilated small bowel loops. There is retained low-density stool throughout the ascending colon. The transverse colon is distended with gas to the splenic flexure. The descending colon is more decompressed with a smaller volume of gas and retained stool. The sigmoid colon and rectum are largely decompressed. There is abnormal free fluid versus organizing fluid collection in the right lower quadrant inferior to the cecum and lateral to the appendix. The appendix does not contain much gas throughout its course, and contains hyperdense material instead, and measures at the upper limits of normal to mildly dilated, 7-8 millimeters. However, there is no phlegmon along the course of the appendix. Rather, the abnormal right lower quadrant fluid tracks along the right adnexa and into the cul-de-sac (series 2, image 69). There is associated peritoneal thickening or organization of the fluid. No extraluminal gas is identified. See additional uterus and ovary findings below. Vascular/Lymphatic: Mild Calcified aortic atherosclerosis. Major arterial structures in the abdomen and pelvis are patent. Portal venous system is patent. No lymphadenopathy. Reproductive: Mild uterine enlargement is stable since 2017. A small dystrophic calcification along the anterior fundus is unchanged, without definite associated uterine fibroid. Both adnexa regions are indistinct.  The right adnexa region is inseparable from the abnormal right lower quadrant fluid which tracks into the cul-de-sac. On series 2, image 65 there is a 16 millimeter small rim enhancing area of fluid which does not seem related to bowel. Musculoskeletal: Dextroconvex thoracic scoliosis. No acute osseous abnormality identified. IMPRESSION: 1. Scattered free fluid in the abdomen, the largest collection of which is in the right lower quadrant and appears to track along the right adnexa into the pelvic cul-de-sac. There is associated mild peritoneal or rim enhancement along the course of the dominant collection suggesting this is a developing Abscess. There is more free flowing/reactive appearing fluid in the upper abdomen adjacent to the liver and spleen. 2. The origin of #1 is unclear. Pelvic inflammatory disease (PID) is favored, with the secondary consideration being acute appendicitis. 3. Evidence of associated small and large bowel ileus, but no other discrete bowel inflammation. 4. Bilateral lung base opacity favored to be atelectasis although developing infection would be difficult to exclude. No pleural effusion. 5. Chronic mild cardiomegaly. Electronically Signed   By: Genevie Ann M.D.   On: 02/01/2018 16:21     Medications:  Scheduled: . lidocaine       Continuous: . sodium chloride 75 mL/hr at 02/01/18 2031  . sodium chloride    . metronidazole 500 mg (02/03/18 1129)  . piperacillin-tazobactam (ZOSYN)  IV Stopped (02/03/18 1129)   IRJ:[id number],  HYDROmorphone (DILAUDID) injection, ketorolac  Assessment/Plan:  Principal Problem:   Intra-abdominal abscess (HCC) Active Problems:   Tobacco abuse   Hypokalemia   Iron deficiency anemia due to chronic blood loss   Chronic narcotic use    Intra-abdominal abscess Etiology unclear but could be due to pelvic inflammatory disease.  She has had this previously as well.  She was found to have an abscess near the cervical area back in March 2018  based on care everywhere reports.  It appears that this was treated at the [facility].  Initially general surgery was consulted.  Interventional radiology consulted for aspiration.  Also discussed with gynecology.  Gynecology has made recommendation regarding antibiotics.  They did not recommend any kind of GYN procedures at this time.  Will wait for aspiration and if cultures are positive then she can get directed treatment.  Currently she is on Zosyn.  GYN has recommended ceftriaxone x1, azithromycin x1 and Flagyl as well.  Follow-up on GC chlamydia.  Patient does have a history of trichomoniasis.  Possible pelvic inflammatory disease See discussion above.  Patient has been made an appointment at [facility] on March 6 at 3:15 PM.  Ileus Noted on CT scan.  Patient seems to be tolerating clear liquid diet well.  N.p.o. this morning for ultrasound-guided aspiration of the fluid. Continue to monitor for now.    Iron deficiency anemia Hemoglobin was 6.2 on 2/22.  She was transfused 2 units of blood.  Hemoglobin has responded appropriately.  She denies any further vaginal spotting or bleeding.  Anemia panel shows iron of 8.  Ferritin 71.  She may benefit from iron infusions.  This will be ordered tomorrow.  She would benefit from oral iron supplementation at discharge.  Suspect she is noncompliant.  She does have a history of menorrhagia which is the reason for her anemia.  History of menorrhagia Some vaginal spotting has been noted but patient denies any significant vaginal bleeding.  Spotting could be due to pelvic inflammatory disease.  Appears to have subsided.  We will continue to watch for now.  Serum beta-hCG less than 1.  Recent urinary tract infection Recently seen at another facility and started on nitrofurantoin.  UA done here in the hospital does not suggest infection.  Hypokalemia Corrected.   DVT Prophylaxis: SCDs    Code Status: Full code Family Communication:  Discussed with the patient Disposition Plan: Management as outlined above.    LOS: 2 days   [place] [facility] Pager [phone] 02/03/2018, 1:09 PM  If 7PM-7AM, please contact night-coverage at [email or web address], password [facility]

## 2018-02-03 NOTE — Consult Note (Signed)
Chief Complaint: Patient was seen in consultation today for intra-abdominal fluid collection  Referring Physician(s): Dr. Bonnielee Haff  Supervising Physician: Sandi Mariscal  Patient Status: [facility] - In-pt  History of Present Illness: [name] is a 42 y.o. female with past medical history of anemia, back pain, on chronic opioids who presented to [facility] ED 02/01/18 with abdominal pain.  IR was consulted for aspiration and drainage and after review of available imaging, a plan was made for paracentesis with fluid culture.   Past Medical History:  Diagnosis Date  . Anemia   . Cervical disc disease   . Fibroids   . Heart murmur   . Low back pain     Past Surgical History:  Procedure Laterality Date  . ANKLE SURGERY    . CESAREAN SECTION    . TUBAL LIGATION      Allergies: Patient has no known allergies.  Medications: Prior to Admission medications   Medication Sig Start Date End Date Taking? Authorizing Provider  naproxen (NAPROSYN) 500 MG tablet Take 500 mg by mouth 2 (two) times daily. 01/29/18 02/08/18 Yes [provider]  nitrofurantoin, macrocrystal-monohydrate, (MACROBID) 100 MG capsule Take 100 mg by mouth 2 (two) times daily. 01/29/18 02/03/18 Yes [provider]  potassium chloride (K-DUR) 10 MEQ tablet Take 10 mEq by mouth 2 (two) times daily. 01/29/18 02/08/18 Yes [provider]     Family History  Problem Relation Age of Onset  . Diabetes Maternal Grandmother   . Hypertension Maternal Aunt   . Bowel Disease Mother     Social History   Socioeconomic History  . Marital status: Divorced    Spouse name: None  . Number of children: None  . Years of education: None  . Highest education level: None  Social Needs  . Financial resource strain: None  . Food insecurity - worry: None  . Food insecurity - inability: None  . Transportation needs - medical: None  . Transportation needs - non-medical: None  Occupational History  . None    Tobacco Use  . Smoking status: Current Every Day Smoker    Packs/day: 0.50    Types: Cigarettes  . Smokeless tobacco: Never Used  Substance and Sexual Activity  . Alcohol use: No    Alcohol/week: 0.0 oz  . Drug use: No  . Sexual activity: Yes    Comment: last sex 06 Jan 2016  Other Topics Concern  . None  Social History Narrative  . None     Review of Systems: A 12 point ROS discussed and pertinent positives are indicated in the HPI above.  All other systems are negative.  Review of Systems  Constitutional: Negative for fatigue and fever.  Respiratory: Negative for cough and shortness of breath.   Cardiovascular: Negative for chest pain.  Gastrointestinal: Positive for abdominal distention, abdominal pain and constipation.  Psychiatric/Behavioral: Negative for behavioral problems and confusion.    Vital Signs: BP 123/89 (BP Location: Left Arm)   Pulse 67   Temp 98.7 F (37.1 C) (Oral)   Resp 18   Ht 5\' 4"  (1.626 m)   Wt 148 lb 8 oz (67.4 kg)   LMP 01/16/2018   SpO2 99%   BMI 25.49 kg/m   Physical Exam  Constitutional: She is oriented to person, place, and time. She appears well-developed.  Pulmonary/Chest: Effort normal and breath sounds normal. No respiratory distress.  Abdominal: She exhibits distension. There is tenderness. There is rebound.  Neurological: She is alert and  oriented to person, place, and time.  Skin: Skin is warm and dry.  Psychiatric: She has a normal mood and affect. Her behavior is normal.  Nursing note and vitals reviewed.    MD Evaluation Airway: WNL Heart: WNL Abdomen: WNL Chest/ Lungs: WNL ASA  Classification: 3 Mallampati/Airway Score: One   Imaging: Ct Abdomen Pelvis W Contrast  Result Date: 02/01/2018 CLINICAL DATA:  42 year old female with lower abdominal pain and distension. Vaginal spotting. Negative quantitative beta HCG. EXAM: CT ABDOMEN AND PELVIS WITH CONTRAST TECHNIQUE: Multidetector CT imaging of the abdomen and  pelvis was performed using the standard protocol following bolus administration of intravenous contrast. CONTRAST:  79mL ISOVUE-300 IOPAMIDOL (ISOVUE-300) INJECTION 61% COMPARISON:  CT Abdomen and Pelvis 02/29/2016, and earlier. FINDINGS: Lower chest: Chronic mild cardiomegaly. No pericardial effusion. No pleural effusion. Increased dependent and bilateral posterior costophrenic angle opacity most resembles atelectasis. Hepatobiliary: Small volume of inferior perihepatic free fluid (series 2, image 39). The liver and gallbladder remain within normal limits. Pancreas: Negative. Spleen: Small volume of free fluid in the anterior left upper quadrant along the anterior aspect of the spleen (series 2, image 13). Splenic enhancement is normal. Adrenals/Urinary Tract: Normal adrenal glands. Both kidneys appear stable since 2017 and within normal limits. There is a right renal midpole simple cyst. Symmetric bilateral renal enhancement and contrast excretion. No hydronephrosis or hydroureter. Unremarkable bladder.  Chronic left pelvic phlebolith is unchanged. Stomach/Bowel: No free air in the upper abdomen. Negative stomach and duodenum. Oral contrast was administered but has only reached the proximal to mid jejunum. There are distal fluid-filled but nondilated small bowel loops. There is retained low-density stool throughout the ascending colon. The transverse colon is distended with gas to the splenic flexure. The descending colon is more decompressed with a smaller volume of gas and retained stool. The sigmoid colon and rectum are largely decompressed. There is abnormal free fluid versus organizing fluid collection in the right lower quadrant inferior to the cecum and lateral to the appendix. The appendix does not contain much gas throughout its course, and contains hyperdense material instead, and measures at the upper limits of normal to mildly dilated, 7-8 millimeters. However, there is no phlegmon along the course of  the appendix. Rather, the abnormal right lower quadrant fluid tracks along the right adnexa and into the cul-de-sac (series 2, image 69). There is associated peritoneal thickening or organization of the fluid. No extraluminal gas is identified. See additional uterus and ovary findings below. Vascular/Lymphatic: Mild Calcified aortic atherosclerosis. Major arterial structures in the abdomen and pelvis are patent. Portal venous system is patent. No lymphadenopathy. Reproductive: Mild uterine enlargement is stable since 2017. A small dystrophic calcification along the anterior fundus is unchanged, without definite associated uterine fibroid. Both adnexa regions are indistinct. The right adnexa region is inseparable from the abnormal right lower quadrant fluid which tracks into the cul-de-sac. On series 2, image 65 there is a 16 millimeter small rim enhancing area of fluid which does not seem related to bowel. Musculoskeletal: Dextroconvex thoracic scoliosis. No acute osseous abnormality identified. IMPRESSION: 1. Scattered free fluid in the abdomen, the largest collection of which is in the right lower quadrant and appears to track along the right adnexa into the pelvic cul-de-sac. There is associated mild peritoneal or rim enhancement along the course of the dominant collection suggesting this is a developing Abscess. There is more free flowing/reactive appearing fluid in the upper abdomen adjacent to the liver and spleen. 2. The origin of #1 is  unclear. Pelvic inflammatory disease (PID) is favored, with the secondary consideration being acute appendicitis. 3. Evidence of associated small and large bowel ileus, but no other discrete bowel inflammation. 4. Bilateral lung base opacity favored to be atelectasis although developing infection would be difficult to exclude. No pleural effusion. 5. Chronic mild cardiomegaly. Electronically Signed   By: Genevie Ann M.D.   On: 02/01/2018 16:21    Labs:  CBC: Recent Labs     02/01/18 1035 02/02/18 0439 02/03/18 0523  WBC 12.9* 10.3 6.4  HGB 7.2* 6.2* 8.0*  HCT 24.7* 20.2* 25.5*  PLT 177 147* 122*    COAGS: No results for input(s): INR, APTT in the last 8760 hours.  BMP: Recent Labs    02/01/18 1035 02/02/18 0439 02/03/18 0523  NA 136 139 138  K 3.4* 4.0 3.6  CL 105 107 105  CO2 22 23 21*  GLUCOSE 94 95 105*  BUN 7 6 9   CALCIUM 8.5* 8.3* 8.3*  CREATININE 0.63 0.65 0.59  GFRNONAA >60 >60 >60  GFRAA >60 >60 >60    LIVER FUNCTION TESTS: Recent Labs    02/01/18 1035  BILITOT 0.7  AST 28  ALT 18  ALKPHOS 113  PROT 7.5  ALBUMIN 3.5    TUMOR MARKERS: No results for input(s): AFPTM, CEA, CA199, CHROMGRNA in the last 8760 hours.  Assessment and Plan: Intra-abdominal fluid collection Patient brought to radiology department today for procedure.  She has a small amount of free fluid in the deep pelvis but no abdominal fluid for paracentesis.  She has considerable abdominal distention with exquisite tenderness.  She still is not having bowel movements.  Reviewed case and imaging with Dr. Pascal Lux who has also scanned abdomen with ultrasound for fluid search.  Dr. Pascal Lux recommends repeating CT Abdomen with contrast to assess for changes prior to proceeding with aspiration.  Reviewed history and care plan with patient who understands.  Will notify primary service of findings.   Thank you for this interesting consult.  I greatly enjoyed meeting [name] and look forward to participating in their care.  A copy of this report was sent to the requesting provider on this date.  Electronically Signed: Docia Barrier, PA 02/03/2018, 2:18 PM   I spent a total of 40 Minutes    in face to face in clinical consultation, greater than 50% of which was counseling/coordinating care for intra-abdominal fluid collection.

## 2018-02-03 NOTE — Progress Notes (Signed)
Recently received a one time order from Dr. Maryland Pink via phone for antiemetic. See chart. Pt c/o of nausea with no vomiting noted.

## 2018-02-03 NOTE — Progress Notes (Signed)
Pt asked nurse as well as [employer] student and instructor to push extra NS behind her IV pain medication. Med given through continuous running IVF. Informed and educated that was against hospital policy with reluctant understanding stating "others have done it, I'll just talk to the director." Reiterated policy and reassured pt. Offered to call [facility], informing pt directors not here today, yet refused. Tammy, AC notified and made aware of situation.

## 2018-02-04 LAB — CBC
HCT: 27.3 % — ABNORMAL LOW (ref 36.0–46.0)
Hemoglobin: 8.4 g/dL — ABNORMAL LOW (ref 12.0–15.0)
MCH: 23.5 pg — ABNORMAL LOW (ref 26.0–34.0)
MCHC: 30.8 g/dL (ref 30.0–36.0)
MCV: 76.3 fL — ABNORMAL LOW (ref 78.0–100.0)
Platelets: 136 10*3/uL — ABNORMAL LOW (ref 150–400)
RBC: 3.58 MIL/uL — ABNORMAL LOW (ref 3.87–5.11)
RDW: 19.5 % — ABNORMAL HIGH (ref 11.5–15.5)
WBC: 5.8 10*3/uL (ref 4.0–10.5)

## 2018-02-04 LAB — BASIC METABOLIC PANEL
Anion gap: 11 (ref 5–15)
BUN: 7 mg/dL (ref 6–20)
CO2: 22 mmol/L (ref 22–32)
Calcium: 8.3 mg/dL — ABNORMAL LOW (ref 8.9–10.3)
Chloride: 104 mmol/L (ref 101–111)
Creatinine, Ser: 0.71 mg/dL (ref 0.44–1.00)
GFR calc Af Amer: >60 mL/min (ref >60)
GFR calc non Af Amer: >60 mL/min (ref >60)
Glucose, Bld: 100 mg/dL — ABNORMAL HIGH (ref 65–99)
Potassium: 4.5 mmol/L (ref 3.5–5.1)
Sodium: 137 mmol/L (ref 135–145)

## 2018-02-04 LAB — PROTIME-INR
INR: 1.08
Prothrombin Time: 13.9 seconds (ref 11.4–15.2)

## 2018-02-04 MED ORDER — SODIUM CHLORIDE 0.9 % IV SOLN
510.0000 mg | Freq: Once | INTRAVENOUS | Status: AC
  Administered 2018-02-04: 510 mg via INTRAVENOUS
  Filled 2018-02-04: qty 17

## 2018-02-04 MED ORDER — ONDANSETRON HCL 4 MG/2ML IJ SOLN
4.0000 mg | Freq: Three times a day (TID) | INTRAMUSCULAR | Status: DC | PRN
  Administered 2018-02-04 – 2018-02-06 (×6): 4 mg via INTRAVENOUS
  Filled 2018-02-04 (×6): qty 2

## 2018-02-04 NOTE — Progress Notes (Signed)
[facility] PROGRESS NOTE  OLUWAKEMI SALSBERRY HUD:[id number] DOB: 11/22/1976 DOA: 02/01/2018  PCP: Pa, [facility]  Brief History/Interval Summary: 42 year old female with a past medical history of iron deficiency anemia, menorrhagia, history of small bowel obstruction, history of PID in the past, history of trichomoniasis, chronic pain syndrome presented with abdominal pain and distention for the last 3-4 days.  She underwent CT scan of her abdomen and pelvis which raised concern for intra-abdominal abscess.  Patient was hospitalized for further management.  Reason for Visit: Intra-abdominal abscess  Consultants: General surgery.  GYN.  Interventional radiology  Procedures: None  Antibiotics: Zosyn Ceftriaxone x1 Flagyl. Azithromycin x1   Subjective/Interval History: Patient continues to complain of 7 out of 10 pain in the abdomen.  Continues to require frequent pain medications.  However she looks more comfortable this morning.  Some nausea but no vomiting.  Nursing staff reported that patient mentioned vaginal spotting and bleeding overnight but none was witnessed.    ROS: Denies any headaches.  Objective:  Vital Signs  Vitals:   02/03/18 0920 02/03/18 1315 02/03/18 2141 02/04/18 0512  BP:  123/89 119/69 115/79  Pulse:   72 70  Resp: 18  16 16   Temp:   99.1 F (37.3 C) 98.6 F (37 C)  TempSrc:   Oral Oral  SpO2:  99% 99% 99%  Weight:      Height:        Intake/Output Summary (Last 24 hours) at 02/04/2018 1017 Last data filed at 02/04/2018 0512 Gross per 24 hour  Intake 2450 ml  Output 700 ml  Net 1750 ml   Filed Weights   02/01/18 2151  Weight: 67.4 kg (148 lb 8 oz)    General appearance: Awake alert.  In no distress Resp: Clear to auscultation bilaterally. Cardio: S1-S2 is normal regular.  No rubs murmurs of bruit GI: Abdomen continues to be mildly distended.  Bowel sounds present.  Tender to palpation but better so compared to yesterday.  No  masses organomegaly.   Extremities: No edema Neurologic: No focal deficits.  Lab Results:  Data Reviewed: I have personally reviewed following labs and imaging studies  CBC: Recent Labs  Lab 02/01/18 1035 02/02/18 0439 02/03/18 0523 02/04/18 0511  WBC 12.9* 10.3 6.4 5.8  HGB 7.2* 6.2* 8.0* 8.4*  HCT 24.7* 20.2* 25.5* 27.3*  MCV 74.4* 72.7* 76.3* 76.3*  PLT 177 147* 122* 136*    Basic Metabolic Panel: Recent Labs  Lab 02/01/18 1035 02/02/18 0439 02/03/18 0523 02/04/18 0511  NA 136 139 138 137  K 3.4* 4.0 3.6 4.5  CL 105 107 105 104  CO2 22 23 21* 22  GLUCOSE 94 95 105* 100*  BUN 7 6 9 7   CREATININE 0.63 0.65 0.59 0.71  CALCIUM 8.5* 8.3* 8.3* 8.3*    GFR: Estimated Creatinine Clearance: 87.4 mL/min (by C-G formula based on SCr of 0.71 mg/dL).  Liver Function Tests: Recent Labs  Lab 02/01/18 1035  AST 28  ALT 18  ALKPHOS 113  BILITOT 0.7  PROT 7.5  ALBUMIN 3.5    Recent Labs  Lab 02/01/18 1035  LIPASE 20   Anemia Panel: Recent Labs    02/02/18 0439  FERRITIN 71  TIBC 276  IRON 8*      Radiology Studies: Ct Abdomen Pelvis W Contrast  Result Date: 02/03/2018 CLINICAL DATA:  Prior CT raised the possibility of developing abscesses, follow up for reassessment in further characterization. EXAM: CT ABDOMEN AND PELVIS WITH CONTRAST TECHNIQUE:  Multidetector CT imaging of the abdomen and pelvis was performed using the standard protocol following bolus administration of intravenous contrast. CONTRAST:  165mL ISOVUE-300 IOPAMIDOL (ISOVUE-300) INJECTION 61% COMPARISON:  02/01/2018 FINDINGS: Lower chest: There is atelectasis along the hemidiaphragms bilaterally, similar to prior. Mild cardiomegaly. Hepatobiliary: Unremarkable Pancreas: Unremarkable Spleen: Unremarkable Adrenals/Urinary Tract: Right mid kidney fluid density cyst, stable. Distal ureters indistinct. The kidneys appear otherwise unremarkable. Adrenal glands normal. Urinary bladder is relatively  empty. No urinary tract calculi are identified. Stomach/Bowel: High density in the appendix is likely related to contrast medium. There is only a very small amount of oral contrast medium primarily in the proximal colon, this can make it difficult to differentiate small-bowel loops from abscess. Scattered air-fluid levels in nondilated small bowel loops noted. Vascular/Lymphatic: A left periaortic lymph node measures 0.9 cm in short axis on image 37/2, previously 0.8 cm. Scattered additional likely reactive lymph nodes are present but are not overtly pathologically enlarged. Reproductive: Endometrial thickness 1.6 cm. The ovaries are difficult to differentiate from surrounding structures. There may be a 1.3 cm cyst or follicle in the left ovary on image 64/2. A stable 1.6 cm focus of rim enhancement in the right pelvis could be in the right ovary but this is uncertain. Other: Mesenteric and omental edema. Right pelvic fluid collection with enhancing margins measures about 4.6 by 2.5 cm, previously about 6.0 by 3.6 cm. As noted above there are hypodense lesions potentially within or along the ovaries. Slight reduction in the perihepatic ascites and ascites along the paracolic gutters. Mild reduction in the left upper quadrant ascites. The an oval-shaped right pelvic collection of fluid measuring 3.3 by 2.0 by 3.6 cm (volume = 12 cm^3) previously measured 3.1 by 1.9 by 3.2 cm. (Volume = 12 cm^3). Musculoskeletal: Mild subcutaneous edema along the hips and pelvis. IMPRESSION: 1. Mildly reduced ascites compared to the prior exam. One of the right pelvic collections is stable in size and 1 is slightly smaller compared to previous. These have faint enhancing margins. Presuming the patient has fever and other signs of infection, these probably represent small pelvic abscesses. 2. Several hypodense lesions along the ovaries may represent follicles or less likely small tubo-ovarian abscesses. 3. Continued mesenteric and  omental edema along with mild subcutaneous edema. Electronically Signed   By: Van Clines M.D.   On: 02/03/2018 17:24   [place] Ascites (abdomen Limited)  Result Date: 02/03/2018 CLINICAL DATA:  Intra-abdominal fluid, please perform ultrasound-guided paracentesis. EXAM: LIMITED ABDOMEN ULTRASOUND FOR ASCITES TECHNIQUE: Limited ultrasound survey for ascites was performed in all four abdominal quadrants. COMPARISON:  CT abdomen pelvis - 02/01/2018 FINDINGS: Sonographic evaluation of the abdomen, augmented by the dictating interventional radiologist demonstrates only a trace amount of fluid within the right lower abdominal quadrant, too small to allow for safe ultrasound-guided paracentesis. IMPRESSION: Small amount of intra-abdominal fluid, too small to allow for safe ultrasound-guided paracentesis. Above was discussed with ordering physician, Dr. Maryland Pink - given concern for potential developing abdominal/pelvic abscess, repeat CT scan of the abdomen and pelvis was recommended as CT-guided aspiration and/or drainage catheter placement may ultimately be required. Electronically Signed   By: Sandi Mariscal M.D.   On: 02/03/2018 14:35     Medications:  Scheduled:  Continuous: . sodium chloride 75 mL/hr at 02/01/18 2031  . sodium chloride    . metronidazole Stopped (02/04/18 0902)  . piperacillin-tazobactam (ZOSYN)  IV Stopped (02/04/18 0902)   GUR:[id number], HYDROmorphone (DILAUDID) injection, ketorolac, ondansetron (ZOFRAN) IV  Assessment/Plan:  Principal Problem:  Intra-abdominal abscess (HCC) Active Problems:   Tobacco abuse   Hypokalemia   Iron deficiency anemia due to chronic blood loss   Chronic narcotic use    Intra-abdominal abscess Etiology unclear but could be due to pelvic inflammatory disease.  She has had this previously as well.  She was found to have an abscess near the cervix area back in March 2018 based on care everywhere reports.  It appears that this was treated at  the [facility].  Initially general surgery was consulted.  Interventional radiology consulted for aspiration.  Also discussed with gynecology.  Gynecology has made recommendation regarding antibiotics.  They did not recommend any kind of GYN procedures at this time.  Patient was taken down for fluid aspiration 2/23 but not enough fluid was found on ultrasound.  Discussed with interventional radiology who recommended repeating CT scan.  CT scan was repeated and shows decrease in the amount of fluid and perhaps smaller size of the fluid collections as well.  Discussed with Dr. Maryclare Bean today.  There is no safe way to approach the fluid collection via CT guidance.  Since patient appears to be improving without fever there is no strong indication to do aspiration.  We will continue with IV antibiotics for now.  All of the cultures have been negative. Currently she is on Zosyn.  GYN recommended ceftriaxone x1, azithromycin x1 and Flagyl as well.  Follow-up on GC/chlamydia.  Patient does have a history of trichomoniasis.  Possible pelvic inflammatory disease See discussion above.  Patient has been made an appointment at [facility] on March 6 at 3:15 PM.  Ileus Noted on CT scan.  Does not appear to be symptomatic.  Advance diet.   Iron deficiency anemia Hemoglobin was 6.2 on 2/22.  She was transfused 2 units of blood.  Hemoglobin has responded appropriately.  She denies any further vaginal spotting or bleeding.  Anemia panel shows iron of 8.  Ferritin 71.  She may benefit from a dose of Feraheme.  This will be ordered.  She would benefit from oral iron supplementation at discharge.  Suspect she is noncompliant.  She does have a history of menorrhagia which is the reason for her anemia.  History of menorrhagia Some vaginal spotting has been noted but patient denies any significant vaginal bleeding.  Spotting could be due to pelvic inflammatory disease.  Some reports of vaginal spotting overnight.   None witnessed by nursing staff.  Hemoglobin is stable this morning.  Continue to monitor for now.  Serum beta-hCG less than 1.  Recent urinary tract infection Recently seen at another facility and started on nitrofurantoin.  UA done here in the hospital does not suggest infection.  Hypokalemia Corrected.  DVT Prophylaxis: SCDs    Code Status: Full code Family Communication: Discussed with the patient Disposition Plan: Management as outlined above.    LOS: 3 days   [facility] Pager [phone] 02/04/2018, 10:17 AM  If 7PM-7AM, please contact night-coverage at [email or web address], password [facility]

## 2018-02-04 NOTE — Progress Notes (Signed)
Referring Physician(s): Dr. Maryland Pink  Supervising Physician: Marybelle Killings  Patient Status:  [name] - In-pt  Chief Complaint: Intra-abdominal fluid collection  Subjective: Patient complains of abdominal pain.  Has heating blanket.    Allergies: Patient has no known allergies.  Medications: Prior to Admission medications   Medication Sig Start Date End Date Taking? Authorizing Provider  naproxen (NAPROSYN) 500 MG tablet Take 500 mg by mouth 2 (two) times daily. 01/29/18 02/08/18 Yes [provider]  potassium chloride (K-DUR) 10 MEQ tablet Take 10 mEq by mouth 2 (two) times daily. 01/29/18 02/08/18 Yes [provider]     Vital Signs: BP 115/79 (BP Location: Right Arm)   Pulse 70   Temp 98.6 F (37 C) (Oral)   Resp 16   Ht 5\' 4"  (1.626 m)   Wt 148 lb 8 oz (67.4 kg)   LMP 01/16/2018   SpO2 99%   BMI 25.49 kg/m   Physical Exam  Constitutional: She appears well-developed.  Cardiovascular: Normal rate and regular rhythm.  Pulmonary/Chest: Effort normal and breath sounds normal.  Abdominal: She exhibits distension. There is tenderness (generalized).  Skin: Skin is warm and dry.  Nursing note and vitals reviewed.   Imaging: Ct Abdomen Pelvis W Contrast  Result Date: 02/03/2018 CLINICAL DATA:  Prior CT raised the possibility of developing abscesses, follow up for reassessment in further characterization. EXAM: CT ABDOMEN AND PELVIS WITH CONTRAST TECHNIQUE: Multidetector CT imaging of the abdomen and pelvis was performed using the standard protocol following bolus administration of intravenous contrast. CONTRAST:  16mL ISOVUE-300 IOPAMIDOL (ISOVUE-300) INJECTION 61% COMPARISON:  02/01/2018 FINDINGS: Lower chest: There is atelectasis along the hemidiaphragms bilaterally, similar to prior. Mild cardiomegaly. Hepatobiliary: Unremarkable Pancreas: Unremarkable Spleen: Unremarkable Adrenals/Urinary Tract: Right mid kidney fluid density cyst, stable. Distal ureters  indistinct. The kidneys appear otherwise unremarkable. Adrenal glands normal. Urinary bladder is relatively empty. No urinary tract calculi are identified. Stomach/Bowel: High density in the appendix is likely related to contrast medium. There is only a very small amount of oral contrast medium primarily in the proximal colon, this can make it difficult to differentiate small-bowel loops from abscess. Scattered air-fluid levels in nondilated small bowel loops noted. Vascular/Lymphatic: A left periaortic lymph node measures 0.9 cm in short axis on image 37/2, previously 0.8 cm. Scattered additional likely reactive lymph nodes are present but are not overtly pathologically enlarged. Reproductive: Endometrial thickness 1.6 cm. The ovaries are difficult to differentiate from surrounding structures. There may be a 1.3 cm cyst or follicle in the left ovary on image 64/2. A stable 1.6 cm focus of rim enhancement in the right pelvis could be in the right ovary but this is uncertain. Other: Mesenteric and omental edema. Right pelvic fluid collection with enhancing margins measures about 4.6 by 2.5 cm, previously about 6.0 by 3.6 cm. As noted above there are hypodense lesions potentially within or along the ovaries. Slight reduction in the perihepatic ascites and ascites along the paracolic gutters. Mild reduction in the left upper quadrant ascites. The an oval-shaped right pelvic collection of fluid measuring 3.3 by 2.0 by 3.6 cm (volume = 12 cm^3) previously measured 3.1 by 1.9 by 3.2 cm. (Volume = 12 cm^3). Musculoskeletal: Mild subcutaneous edema along the hips and pelvis. IMPRESSION: 1. Mildly reduced ascites compared to the prior exam. One of the right pelvic collections is stable in size and 1 is slightly smaller compared to previous. These have faint enhancing margins. Presuming the patient has fever and other signs of  infection, these probably represent small pelvic abscesses. 2. Several hypodense lesions along the  ovaries may represent follicles or less likely small tubo-ovarian abscesses. 3. Continued mesenteric and omental edema along with mild subcutaneous edema. Electronically Signed   By: Van Clines M.D.   On: 02/03/2018 17:24   Ct Abdomen Pelvis W Contrast  Result Date: 02/01/2018 CLINICAL DATA:  42 year old female with lower abdominal pain and distension. Vaginal spotting. Negative quantitative beta HCG. EXAM: CT ABDOMEN AND PELVIS WITH CONTRAST TECHNIQUE: Multidetector CT imaging of the abdomen and pelvis was performed using the standard protocol following bolus administration of intravenous contrast. CONTRAST:  56mL ISOVUE-300 IOPAMIDOL (ISOVUE-300) INJECTION 61% COMPARISON:  CT Abdomen and Pelvis 02/29/2016, and earlier. FINDINGS: Lower chest: Chronic mild cardiomegaly. No pericardial effusion. No pleural effusion. Increased dependent and bilateral posterior costophrenic angle opacity most resembles atelectasis. Hepatobiliary: Small volume of inferior perihepatic free fluid (series 2, image 39). The liver and gallbladder remain within normal limits. Pancreas: Negative. Spleen: Small volume of free fluid in the anterior left upper quadrant along the anterior aspect of the spleen (series 2, image 13). Splenic enhancement is normal. Adrenals/Urinary Tract: Normal adrenal glands. Both kidneys appear stable since 2017 and within normal limits. There is a right renal midpole simple cyst. Symmetric bilateral renal enhancement and contrast excretion. No hydronephrosis or hydroureter. Unremarkable bladder.  Chronic left pelvic phlebolith is unchanged. Stomach/Bowel: No free air in the upper abdomen. Negative stomach and duodenum. Oral contrast was administered but has only reached the proximal to mid jejunum. There are distal fluid-filled but nondilated small bowel loops. There is retained low-density stool throughout the ascending colon. The transverse colon is distended with gas to the splenic flexure. The  descending colon is more decompressed with a smaller volume of gas and retained stool. The sigmoid colon and rectum are largely decompressed. There is abnormal free fluid versus organizing fluid collection in the right lower quadrant inferior to the cecum and lateral to the appendix. The appendix does not contain much gas throughout its course, and contains hyperdense material instead, and measures at the upper limits of normal to mildly dilated, 7-8 millimeters. However, there is no phlegmon along the course of the appendix. Rather, the abnormal right lower quadrant fluid tracks along the right adnexa and into the cul-de-sac (series 2, image 69). There is associated peritoneal thickening or organization of the fluid. No extraluminal gas is identified. See additional uterus and ovary findings below. Vascular/Lymphatic: Mild Calcified aortic atherosclerosis. Major arterial structures in the abdomen and pelvis are patent. Portal venous system is patent. No lymphadenopathy. Reproductive: Mild uterine enlargement is stable since 2017. A small dystrophic calcification along the anterior fundus is unchanged, without definite associated uterine fibroid. Both adnexa regions are indistinct. The right adnexa region is inseparable from the abnormal right lower quadrant fluid which tracks into the cul-de-sac. On series 2, image 65 there is a 16 millimeter small rim enhancing area of fluid which does not seem related to bowel. Musculoskeletal: Dextroconvex thoracic scoliosis. No acute osseous abnormality identified. IMPRESSION: 1. Scattered free fluid in the abdomen, the largest collection of which is in the right lower quadrant and appears to track along the right adnexa into the pelvic cul-de-sac. There is associated mild peritoneal or rim enhancement along the course of the dominant collection suggesting this is a developing Abscess. There is more free flowing/reactive appearing fluid in the upper abdomen adjacent to the  liver and spleen. 2. The origin of #1 is unclear. Pelvic inflammatory disease (PID)  is favored, with the secondary consideration being acute appendicitis. 3. Evidence of associated small and large bowel ileus, but no other discrete bowel inflammation. 4. Bilateral lung base opacity favored to be atelectasis although developing infection would be difficult to exclude. No pleural effusion. 5. Chronic mild cardiomegaly. Electronically Signed   By: Genevie Ann M.D.   On: 02/01/2018 16:21   [place] Ascites (abdomen Limited)  Result Date: 02/03/2018 CLINICAL DATA:  Intra-abdominal fluid, please perform ultrasound-guided paracentesis. EXAM: LIMITED ABDOMEN ULTRASOUND FOR ASCITES TECHNIQUE: Limited ultrasound survey for ascites was performed in all four abdominal quadrants. COMPARISON:  CT abdomen pelvis - 02/01/2018 FINDINGS: Sonographic evaluation of the abdomen, augmented by the dictating interventional radiologist demonstrates only a trace amount of fluid within the right lower abdominal quadrant, too small to allow for safe ultrasound-guided paracentesis. IMPRESSION: Small amount of intra-abdominal fluid, too small to allow for safe ultrasound-guided paracentesis. Above was discussed with ordering physician, Dr. Maryland Pink - given concern for potential developing abdominal/pelvic abscess, repeat CT scan of the abdomen and pelvis was recommended as CT-guided aspiration and/or drainage catheter placement may ultimately be required. Electronically Signed   By: Sandi Mariscal M.D.   On: 02/03/2018 14:35    Labs:  CBC: Recent Labs    02/01/18 1035 02/02/18 0439 02/03/18 0523 02/04/18 0511  WBC 12.9* 10.3 6.4 5.8  HGB 7.2* 6.2* 8.0* 8.4*  HCT 24.7* 20.2* 25.5* 27.3*  PLT 177 147* 122* 136*    COAGS: Recent Labs    02/04/18 0511  INR 1.08    BMP: Recent Labs    02/01/18 1035 02/02/18 0439 02/03/18 0523 02/04/18 0511  NA 136 139 138 137  K 3.4* 4.0 3.6 4.5  CL 105 107 105 104  CO2 22 23 21* 22    GLUCOSE 94 95 105* 100*  BUN 7 6 9 7   CALCIUM 8.5* 8.3* 8.3* 8.3*  CREATININE 0.63 0.65 0.59 0.71  GFRNONAA >60 >60 >60 >60  GFRAA >60 >60 >60 >60    LIVER FUNCTION TESTS: Recent Labs    02/01/18 1035  BILITOT 0.7  AST 28  ALT 18  ALKPHOS 113  PROT 7.5  ALBUMIN 3.5    Assessment and Plan: Intra-abdominal fluid collection A CT Abdomen/Pelvis obtained yesterday and reviewed with Dr. Barbie Banner. Trace ascites with stable right pelvic collections which are small in size.  No procedure planned today.  Will lift NPO order.  Spoke with patient at bedside.   Electronically Signed: Docia Barrier, PA 02/04/2018, 11:01 AM   I spent a total of 15 Minutes at the the patient's bedside AND on the patient's hospital floor or unit, greater than 50% of which was counseling/coordinating care for intra-abdominal fluid collection.

## 2018-02-05 LAB — GC/CHLAMYDIA PROBE AMP (~~LOC~~) NOT AT ARMC
Chlamydia: NEGATIVE
Neisseria Gonorrhea: NEGATIVE

## 2018-02-05 MED ORDER — KETOROLAC TROMETHAMINE 15 MG/ML IJ SOLN
15.0000 mg | Freq: Four times a day (QID) | INTRAMUSCULAR | Status: DC | PRN
  Administered 2018-02-06: 15 mg via INTRAVENOUS
  Filled 2018-02-05: qty 1

## 2018-02-05 MED ORDER — POLYETHYLENE GLYCOL 3350 17 G PO PACK
17.0000 g | PACK | Freq: Every day | ORAL | Status: DC
Start: 2018-02-05 — End: 2018-02-06
  Administered 2018-02-05 – 2018-02-06 (×2): 17 g via ORAL
  Filled 2018-02-05 (×2): qty 1

## 2018-02-05 MED ORDER — OXYCODONE HCL 5 MG PO TABS
5.0000 mg | ORAL_TABLET | ORAL | Status: DC | PRN
  Administered 2018-02-05: 5 mg via ORAL
  Administered 2018-02-05 (×2): 10 mg via ORAL
  Administered 2018-02-06: 5 mg via ORAL
  Administered 2018-02-06: 10 mg via ORAL
  Filled 2018-02-05 (×5): qty 2

## 2018-02-05 MED ORDER — SENNA 8.6 MG PO TABS
1.0000 | ORAL_TABLET | Freq: Every day | ORAL | Status: DC
  Administered 2018-02-05 – 2018-02-06 (×2): 8.6 mg via ORAL
  Filled 2018-02-05 (×2): qty 1

## 2018-02-05 NOTE — Progress Notes (Signed)
Patient requested to get off the unit to go get some money downstairs.  Hospitalist notified. Request declined.

## 2018-02-05 NOTE — Progress Notes (Signed)
[facility] PROGRESS NOTE  [name] POE:[id number] DOB: 1976/03/06 DOA: 02/01/2018  PCP: [facility], [facility]  Brief History/Interval Summary: 42 year old female with a past medical history of iron deficiency anemia, menorrhagia, history of small bowel obstruction, history of PID in the past, history of trichomoniasis, chronic pain syndrome presented with abdominal pain and distention for the last 3-4 days.  She underwent CT scan of her abdomen and pelvis which raised concern for intra-abdominal abscess.  Patient was hospitalized for further management.  Patient's case was discussed with the gynecology.  Reason for Visit: Intra-abdominal abscess  Consultants: General surgery.  GYN.  Interventional radiology  Procedures: None  Antibiotics: Zosyn Ceftriaxone x1 Flagyl. Azithromycin x1   Subjective/Interval History: Patient appears to be much more comfortable than she has been previously.  However she states that her pain remains poorly controlled.  She was noted to be talking on the phone with a smile on her face.  No nausea or vomiting.  Has been tolerating her diet.  Has not had any bowel movements.  No reports of any vaginal spotting or bleeding.    ROS: Denies any headaches  Objective:  Vital Signs  Vitals:   02/03/18 2141 02/04/18 0512 02/04/18 1500 02/04/18 2034  BP: 119/69 115/79 112/72 120/66  Pulse: 72 70 64 62  Resp: 16 16 16 16   Temp: 99.1 F (37.3 C) 98.6 F (37 C) 98.8 F (37.1 C) 99.5 F (37.5 C)  TempSrc: Oral Oral Oral Oral  SpO2: 99% 99% 99% 98%  Weight:      Height:        Intake/Output Summary (Last 24 hours) at 02/05/2018 1249 Last data filed at 02/05/2018 0739 Gross per 24 hour  Intake 3448.25 ml  Output 550 ml  Net 2898.25 ml   Filed Weights   02/01/18 2151  Weight: 67.4 kg (148 lb 8 oz)    General appearance: Awake alert.  In no distress. Resp: Clear to auscultation bilaterally Cardio: S1-S2 is normal regular.  No S3-S4.  No  rubs murmurs of bruit GI: Abdomen remains mildly distended but softer today.  Bowel sounds are present.  Less tender to palpation compared to the last few days.  No masses or organomegaly. Extremities: No edema Neurologic: No focal neurological deficit  Lab Results:  Data Reviewed: I have personally reviewed following labs and imaging studies  CBC: Recent Labs  Lab 02/01/18 1035 02/02/18 0439 02/03/18 0523 02/04/18 0511  WBC 12.9* 10.3 6.4 5.8  HGB 7.2* 6.2* 8.0* 8.4*  HCT 24.7* 20.2* 25.5* 27.3*  MCV 74.4* 72.7* 76.3* 76.3*  PLT 177 147* 122* 136*    Basic Metabolic Panel: Recent Labs  Lab 02/01/18 1035 02/02/18 0439 02/03/18 0523 02/04/18 0511  NA 136 139 138 137  K 3.4* 4.0 3.6 4.5  CL 105 107 105 104  CO2 22 23 21* 22  GLUCOSE 94 95 105* 100*  BUN 7 6 9 7   CREATININE 0.63 0.65 0.59 0.71  CALCIUM 8.5* 8.3* 8.3* 8.3*    GFR: Estimated Creatinine Clearance: 87.4 mL/min (by C-G formula based on SCr of 0.71 mg/dL).  Liver Function Tests: Recent Labs  Lab 02/01/18 1035  AST 28  ALT 18  ALKPHOS 113  BILITOT 0.7  PROT 7.5  ALBUMIN 3.5    Recent Labs  Lab 02/01/18 1035  LIPASE 20   Anemia Panel: No results for input(s): VITAMINB12, FOLATE, FERRITIN, TIBC, IRON, RETICCTPCT in the last 72 hours.    Radiology Studies: Ct Abdomen Pelvis  W Contrast  Result Date: 02/03/2018 CLINICAL DATA:  Prior CT raised the possibility of developing abscesses, follow up for reassessment in further characterization. EXAM: CT ABDOMEN AND PELVIS WITH CONTRAST TECHNIQUE: Multidetector CT imaging of the abdomen and pelvis was performed using the standard protocol following bolus administration of intravenous contrast. CONTRAST:  128mL ISOVUE-300 IOPAMIDOL (ISOVUE-300) INJECTION 61% COMPARISON:  02/01/2018 FINDINGS: Lower chest: There is atelectasis along the hemidiaphragms bilaterally, similar to prior. Mild cardiomegaly. Hepatobiliary: Unremarkable Pancreas: Unremarkable Spleen:  Unremarkable Adrenals/Urinary Tract: Right mid kidney fluid density cyst, stable. Distal ureters indistinct. The kidneys appear otherwise unremarkable. Adrenal glands normal. Urinary bladder is relatively empty. No urinary tract calculi are identified. Stomach/Bowel: High density in the appendix is likely related to contrast medium. There is only a very small amount of oral contrast medium primarily in the proximal colon, this can make it difficult to differentiate small-bowel loops from abscess. Scattered air-fluid levels in nondilated small bowel loops noted. Vascular/Lymphatic: A left periaortic lymph node measures 0.9 cm in short axis on image 37/2, previously 0.8 cm. Scattered additional likely reactive lymph nodes are present but are not overtly pathologically enlarged. Reproductive: Endometrial thickness 1.6 cm. The ovaries are difficult to differentiate from surrounding structures. There may be a 1.3 cm cyst or follicle in the left ovary on image 64/2. A stable 1.6 cm focus of rim enhancement in the right pelvis could be in the right ovary but this is uncertain. Other: Mesenteric and omental edema. Right pelvic fluid collection with enhancing margins measures about 4.6 by 2.5 cm, previously about 6.0 by 3.6 cm. As noted above there are hypodense lesions potentially within or along the ovaries. Slight reduction in the perihepatic ascites and ascites along the paracolic gutters. Mild reduction in the left upper quadrant ascites. The an oval-shaped right pelvic collection of fluid measuring 3.3 by 2.0 by 3.6 cm (volume = 12 cm^3) previously measured 3.1 by 1.9 by 3.2 cm. (Volume = 12 cm^3). Musculoskeletal: Mild subcutaneous edema along the hips and pelvis. IMPRESSION: 1. Mildly reduced ascites compared to the prior exam. One of the right pelvic collections is stable in size and 1 is slightly smaller compared to previous. These have faint enhancing margins. Presuming the patient has fever and other signs of  infection, these probably represent small pelvic abscesses. 2. Several hypodense lesions along the ovaries may represent follicles or less likely small tubo-ovarian abscesses. 3. Continued mesenteric and omental edema along with mild subcutaneous edema. Electronically Signed   By: Van Clines M.D.   On: 02/03/2018 17:24   [place] Ascites (abdomen Limited)  Result Date: 02/03/2018 CLINICAL DATA:  Intra-abdominal fluid, please perform ultrasound-guided paracentesis. EXAM: LIMITED ABDOMEN ULTRASOUND FOR ASCITES TECHNIQUE: Limited ultrasound survey for ascites was performed in all four abdominal quadrants. COMPARISON:  CT abdomen pelvis - 02/01/2018 FINDINGS: Sonographic evaluation of the abdomen, augmented by the dictating interventional radiologist demonstrates only a trace amount of fluid within the right lower abdominal quadrant, too small to allow for safe ultrasound-guided paracentesis. IMPRESSION: Small amount of intra-abdominal fluid, too small to allow for safe ultrasound-guided paracentesis. Above was discussed with ordering physician, Dr. Maryland Compton - given concern for potential developing abdominal/pelvic abscess, repeat CT scan of the abdomen and pelvis was recommended as CT-guided aspiration and/or drainage catheter placement may ultimately be required. Electronically Signed   By: Sandi Mariscal M.D.   On: 02/03/2018 14:35     Medications:  Scheduled:  Continuous: . sodium chloride 75 mL/hr at 02/01/18 2031  . sodium chloride    .  metronidazole Stopped (02/05/18 0558)  . piperacillin-tazobactam (ZOSYN)  IV 3.375 g (02/05/18 0647)   GLO:VFIEPPIRJJOAC, HYDROmorphone (DILAUDID) injection, ketorolac, ondansetron (ZOFRAN) IV  Assessment/Plan:  Principal Problem:   Intra-abdominal abscess (HCC) Active Problems:   Tobacco abuse   Hypokalemia   Iron deficiency anemia due to chronic blood loss   Chronic narcotic use    Intra-abdominal abscess Etiology unclear but could be due to pelvic  inflammatory disease.  She has had this previously as well.  She was found to have an abscess near the cervix area back in March 2018 based on care everywhere reports.  It appears that this was treated at the [facility].  Initially general surgery was consulted.  Interventional radiology consulted for aspiration.  Also discussed with gynecology.  Gynecology has made recommendation regarding antibiotics.  They did not recommend any kind of GYN procedures at this time.  Patient was taken down for fluid aspiration 2/23 but not enough fluid was found on ultrasound.  Discussed with interventional radiology who recommended repeating CT scan.  CT scan was repeated and shows decrease in the amount of fluid and perhaps smaller size of the fluid collections as well.  Discussed with Dr. Maryclare Bean.  There is no safe way to approach the fluid collection via CT guidance.  Since patient appears to be improving without fever there is no strong indication to do aspiration.  Patient continues to mention severe abdominal pain although she appears to be more comfortable this morning.  Talking on the phone with a spinal face.  No nausea vomiting.  Some element of drug-seeking behavior appears to be present.  Discussed with her that we need to transition her to oral pain medications.  We will initiate OxyIR.  Cut back on Dilaudid as much as possible.  If she tolerates her soft diet today we will change her over to oral antibiotics tomorrow and potential discharge.  She has an appointment with GYN next week.  Patient is on Zosyn currently.  She has received ceftriaxone and azithromycin.  She is also on Flagyl.  GC chlamydia remains pending despite being ordered twice.  Discussed with nurse.  She has sent a urine sample down to lab this morning.  Possible pelvic inflammatory disease See discussion above.  Patient has been made an appointment at [facility] on March 6 at 3:15 PM.  Ileus This was noted on initial CT scan  but not on repeat CT scan.  Appears to have resolved.  She is tolerating her diet.     Iron deficiency anemia Hemoglobin was 6.2 on 2/22.  She was transfused 2 units of blood.  Hemoglobin has responded appropriately.  She denies any further vaginal spotting or bleeding.  Anemia panel shows iron of 8.  Ferritin 71.  She was given a dose of Feraheme on 2/24.  She would benefit from oral iron supplementation at discharge.  Suspect she is noncompliant.  She does have a history of menorrhagia which is the reason for her anemia.  Hemoglobin has been stable.  History of menorrhagia Some vaginal spotting has been noted but patient denies any significant vaginal bleeding.  Serum beta-hCG less than 1.  Recent urinary tract infection Recently seen at another facility and started on nitrofurantoin.  UA done here in the hospital does not suggest infection.  Hypokalemia Corrected.  DVT Prophylaxis: SCDs    Code Status: Full code Family Communication: Discussed with the patient Disposition Plan: Management as outlined above.    LOS: 4 days  Bonnielee Haff  [facility] Pager [phone] 02/05/2018, 12:49 PM  If 7PM-7AM, please contact night-coverage at [email or web address], password [email or web address]

## 2018-02-06 DIAGNOSIS — N730 Acute parametritis and pelvic cellulitis: Secondary | ICD-10-CM

## 2018-02-06 LAB — CULTURE, BLOOD (ROUTINE X 2)
Culture: NO GROWTH
Culture: NO GROWTH
Special Requests: ADEQUATE
Special Requests: ADEQUATE

## 2018-02-06 LAB — CBC
HCT: 24.6 % — ABNORMAL LOW (ref 36.0–46.0)
Hemoglobin: 7.9 g/dL — ABNORMAL LOW (ref 12.0–15.0)
MCH: 24.9 pg — ABNORMAL LOW (ref 26.0–34.0)
MCHC: 32.1 g/dL (ref 30.0–36.0)
MCV: 77.6 fL — ABNORMAL LOW (ref 78.0–100.0)
Platelets: 130 10*3/uL — ABNORMAL LOW (ref 150–400)
RBC: 3.17 MIL/uL — ABNORMAL LOW (ref 3.87–5.11)
RDW: 20.4 % — ABNORMAL HIGH (ref 11.5–15.5)
WBC: 5.1 10*3/uL (ref 4.0–10.5)

## 2018-02-06 LAB — GC/CHLAMYDIA PROBE AMP (~~LOC~~) NOT AT ARMC
Chlamydia: NEGATIVE
Neisseria Gonorrhea: NEGATIVE

## 2018-02-06 MED ORDER — METRONIDAZOLE 500 MG PO TABS: 500.0000 mg | ORAL_TABLET | Freq: Two times a day (BID) | ORAL | 0 refills | Status: AC

## 2018-02-06 MED ORDER — HEATING PAD DRY HEAT PADS: MEDICATED_PAD | 0 refills | Status: DC

## 2018-02-06 MED ORDER — FERROUS SULFATE 325 (65 FE) MG PO TABS: 325.0000 mg | ORAL_TABLET | Freq: Two times a day (BID) | ORAL | 3 refills | Status: DC

## 2018-02-06 MED ORDER — DOXYCYCLINE HYCLATE 100 MG PO TABS
100.0000 mg | ORAL_TABLET | Freq: Two times a day (BID) | ORAL | Status: DC
  Administered 2018-02-06: 100 mg via ORAL
  Filled 2018-02-06: qty 1

## 2018-02-06 MED ORDER — DOXYCYCLINE HYCLATE 100 MG PO TABS: 100.0000 mg | ORAL_TABLET | Freq: Two times a day (BID) | ORAL | 0 refills | Status: AC

## 2018-02-06 MED ORDER — METRONIDAZOLE 500 MG PO TABS
500.0000 mg | ORAL_TABLET | Freq: Two times a day (BID) | ORAL | Status: DC
  Administered 2018-02-06: 500 mg via ORAL
  Filled 2018-02-06: qty 1

## 2018-02-06 NOTE — Discharge Instructions (Signed)
Pelvic Inflammatory Disease Pelvic inflammatory disease (PID) is an infection in some or all of the female organs. PID can be in the uterus, ovaries, fallopian tubes, or the surrounding tissues that are inside the lower belly area (pelvis). PID can lead to lasting problems if it is not treated. To check for this disease, your doctor may:  Do a physical exam.  Do blood tests, urine tests, or a pregnancy test.  Look at your vaginal discharge.  Do tests to look inside the pelvis.  Test you for other infections.  Follow these instructions at home:  Take over-the-counter and prescription medicines only as told by your doctor.  If you were prescribed an antibiotic medicine, take it as told by your doctor. Do not stop taking it even if you start to feel better.  Do not have sex until treatment is done or as told by your doctor.  Tell your sex partner if you have PID. Your partner may need to be treated.  Keep all follow-up visits as told by your doctor. This is important.  Your doctor may test you for infection again 3 months after you are treated. Contact a doctor if:  You have more fluid (discharge) coming from your vagina or fluid that is not normal.  Your pain does not improve.  You throw up (vomit).  You have a fever.  You cannot take your medicines.  Your partner has a sexually transmitted disease (STD).  You have pain when you pee (urinate). Get help right away if:  You have more belly (abdominal) or lower belly pain.  You have chills.  You are not better after 72 hours. This information is not intended to replace advice given to you by your health care provider. Make sure you discuss any questions you have with your health care provider. Document Released: 02/24/2009 Document Revised: 05/05/2016 Document Reviewed: 01/05/2015 Elsevier Interactive Patient Education  [employer].

## 2018-02-06 NOTE — Discharge Summary (Signed)
[facility]  Physician Discharge Summary   Patient ID: [name] MRN: [id number] DOB/AGE: [birth year] 42 y.o.  Admit date: 02/01/2018 Discharge date: 02/06/2018  PCP: [facility]  DISCHARGE DIAGNOSES:  Principal Problem:   Intra-abdominal abscess ([place]) Active Problems:   Tobacco abuse   Hypokalemia   Iron deficiency anemia due to chronic blood loss   Chronic narcotic use   RECOMMENDATIONS FOR OUTPATIENT FOLLOW UP: 1. Patient instructed to keep her appointment at women's clinic   DISCHARGE CONDITION: fair  Diet recommendation: As before  [facility] Weights   02/01/18 2151  Weight: 67.4 kg (148 lb 8 oz)    INITIAL HISTORY: 42 year old female with a past medical history of iron deficiency anemia, menorrhagia, history of small bowel obstruction, history of PID in the past, history of trichomoniasis, chronic pain syndrome presented with abdominal pain and distention for the last 3-4 days.  She underwent CT scan of her abdomen and pelvis which raised concern for intra-abdominal abscess.  Patient was hospitalized for further management.  Patient's case was discussed with the gynecology.  Consultants: General surgery.  GYN.  Interventional radiology  Procedures: None  Antibiotics: Zosyn Ceftriaxone x1 Flagyl. Azithromycin x1   HOSPITAL COURSE:   Intra-abdominal abscess Etiology unclear but could be due to pelvic inflammatory disease.  She has had this previously as well.  She was found to have an abscess near the cervix area back in March 2018 based on care everywhere reports.  It appears that this was treated at the [facility].  During this admission initially general surgery was consulted.  Interventional radiology consulted for aspiration.  Also discussed with gynecology.  Gynecology made recommendation regarding antibiotics.  They did not recommend any kind of GYN procedures at this time.  Patient was taken for fluid aspiration 2/23 but not  enough fluid was found on ultrasound.  Discussed with interventional radiology who recommended repeating CT scan.  CT scan was repeated and shows decrease in the amount of fluid and perhaps smaller size of the fluid collections as well.  Discussed with Dr. Maryclare Bean, interventional radiologist.  There is no safe way to approach the fluid collection via CT guidance.  Since patient appears to be improving without fever there is no strong indication to do aspiration.  Patient continues to mention severe abdominal pain although she appears to be more comfortable.  She has been seen to be talking on the phone with a smile on her face.  She has been tolerating her diet.  Some element of drug-seeking behavior appears to be present.    She was transitioned to oral pain medications.  Medically stable for discharge today.  She has an appointment with GYN next week.    She will be discharged on doxycycline and Flagyl.  During this hospitalization she was on Zosyn initially and also received 1 dose each of ceftriaxone and azithromycin.  GC chlamydia probe negative.    Possible pelvic inflammatory disease See discussion above.  Patient has been made an appointment at [facility] on March 6 at 3:15 PM.  Ileus This was noted on initial CT scan but not on repeat CT scan.  Appears to have resolved.  She is tolerating her diet.     Iron deficiency anemia Hemoglobin was 6.2 on 2/22.  She was transfused 2 units of blood.  Hemoglobin has responded appropriately.  She denies any further vaginal spotting or bleeding.  Anemia panel shows iron of 8.  Ferritin 71.  She was given  a dose of Feraheme on 2/24.  She would benefit from oral iron supplementation at discharge.  Suspect she is noncompliant.  She does have a history of menorrhagia which is the reason for her anemia.  Hemoglobin has been stable.  History of menorrhagia Some vaginal spotting has been noted but patient denies any significant vaginal bleeding.  Serum  beta-hCG less than 1.  Recent urinary tract infection Recently seen at another facility and started on nitrofurantoin.  UA done here in the hospital does not suggest infection.  Hypokalemia Corrected.  Overall stable.  Okay for discharge home today.    PERTINENT LABS:  The results of significant diagnostics from this hospitalization (including imaging, microbiology, ancillary and laboratory) are listed below for reference.    Microbiology: Recent Results (from the past 240 hour(s))  Urine culture     Status: Abnormal   Collection Time: 02/01/18  4:17 PM  Result Value Ref Range Status   Specimen Description   Final    URINE, RANDOM Performed at [facility] [address] [place], [place] [address]    Special Requests   Final    NONE Performed at [facility], [place] [address] [place], [place] [address]    Culture (A)  Final    <10,000 COLONIES/mL INSIGNIFICANT GROWTH Performed at [facility] [address] [place], [place] [address]    Report Status 02/03/2018 FINAL  Final  Culture, blood (routine x 2)     Status: None   Collection Time: 02/01/18  8:27 PM  Result Value Ref Range Status   Specimen Description   Final    BLOOD RIGHT HAND Performed at [facility] [address], [place], [place] [address]    Special Requests   Final    BOTTLES DRAWN AEROBIC AND ANAEROBIC Blood Culture adequate volume Performed at [facility] [address] [place], [place] [address]    Culture   Final    NO GROWTH 5 DAYS Performed at [facility], [place] [address] [place], [place] [address]    Report Status 02/06/2018 FINAL  Final  Culture, blood (routine x 2)     Status: None   Collection Time: 02/01/18  8:33 PM  Result Value Ref Range Status   Specimen Description   Final    BLOOD LEFT HAND Performed at [facility] [address] [place], [place] [address]     Special Requests   Final    BOTTLES DRAWN AEROBIC AND ANAEROBIC Blood Culture adequate volume Performed at [facility] [address] [place], [place] [address]    Culture   Final    NO GROWTH 5 DAYS Performed at [facility], [place] [address] [place], [place] [address]    Report Status 02/06/2018 FINAL  Final     Labs: Basic Metabolic Panel: Recent Labs  Lab 02/01/18 1035 02/02/18 0439 02/03/18 0523 02/04/18 0511  NA 136 139 138 137  K 3.4* 4.0 3.6 4.5  CL 105 107 105 104  CO2 22 23 21* 22  GLUCOSE 94 95 105* 100*  BUN 7 6 9 7   CREATININE 0.63 0.65 0.59 0.71  CALCIUM 8.5* 8.3* 8.3* 8.3*   Liver Function Tests: Recent Labs  Lab 02/01/18 1035  AST 28  ALT 18  ALKPHOS 113  BILITOT 0.7  PROT 7.5  ALBUMIN 3.5   Recent Labs  Lab 02/01/18 1035  LIPASE 20  CBC: Recent Labs  Lab 02/01/18 1035 02/02/18 0439 02/03/18 0523 02/04/18 0511 02/06/18 0446  WBC 12.9* 10.3 6.4 5.8 5.1  HGB 7.2* 6.2* 8.0* 8.4* 7.9*  HCT 24.7* 20.2* 25.5* 27.3* 24.6*  MCV 74.4* 72.7* 76.3* 76.3* 77.6*  PLT 177 147* 122* 136* 130*     IMAGING STUDIES Ct Abdomen Pelvis W Contrast  Result Date: 02/03/2018 CLINICAL DATA:  Prior CT raised the possibility of developing abscesses, follow up for reassessment in further characterization. EXAM: CT ABDOMEN AND PELVIS WITH CONTRAST TECHNIQUE: Multidetector CT imaging of the abdomen and pelvis was performed using the standard protocol following bolus administration of intravenous contrast. CONTRAST:  1103mL ISOVUE-300 IOPAMIDOL (ISOVUE-300) INJECTION 61% COMPARISON:  02/01/2018 FINDINGS: Lower chest: There is atelectasis along the hemidiaphragms bilaterally, similar to prior. Mild cardiomegaly. Hepatobiliary: Unremarkable Pancreas: Unremarkable Spleen: Unremarkable Adrenals/Urinary Tract: Right mid kidney fluid density cyst, stable. Distal ureters indistinct. The kidneys appear otherwise unremarkable. Adrenal glands normal.  Urinary bladder is relatively empty. No urinary tract calculi are identified. Stomach/Bowel: High density in the appendix is likely related to contrast medium. There is only a very small amount of oral contrast medium primarily in the proximal colon, this can make it difficult to differentiate small-bowel loops from abscess. Scattered air-fluid levels in nondilated small bowel loops noted. Vascular/Lymphatic: A left periaortic lymph node measures 0.9 cm in short axis on image 37/2, previously 0.8 cm. Scattered additional likely reactive lymph nodes are present but are not overtly pathologically enlarged. Reproductive: Endometrial thickness 1.6 cm. The ovaries are difficult to differentiate from surrounding structures. There may be a 1.3 cm cyst or follicle in the left ovary on image 64/2. A stable 1.6 cm focus of rim enhancement in the right pelvis could be in the right ovary but this is uncertain. Other: Mesenteric and omental edema. Right pelvic fluid collection with enhancing margins measures about 4.6 by 2.5 cm, previously about 6.0 by 3.6 cm. As noted above there are hypodense lesions potentially within or along the ovaries. Slight reduction in the perihepatic ascites and ascites along the paracolic gutters. Mild reduction in the left upper quadrant ascites. The an oval-shaped right pelvic collection of fluid measuring 3.3 by 2.0 by 3.6 cm (volume = 12 cm^3) previously measured 3.1 by 1.9 by 3.2 cm. (Volume = 12 cm^3). Musculoskeletal: Mild subcutaneous edema along the hips and pelvis. IMPRESSION: 1. Mildly reduced ascites compared to the prior exam. One of the right pelvic collections is stable in size and 1 is slightly smaller compared to previous. These have faint enhancing margins. Presuming the patient has fever and other signs of infection, these probably represent small pelvic abscesses. 2. Several hypodense lesions along the ovaries may represent follicles or less likely small tubo-ovarian abscesses. 3.  Continued mesenteric and omental edema along with mild subcutaneous edema. Electronically Signed   By: Van Clines M.D.   On: 02/03/2018 17:24   Ct Abdomen Pelvis W Contrast  Result Date: 02/01/2018 CLINICAL DATA:  42 year old female with lower abdominal pain and distension. Vaginal spotting. Negative quantitative beta HCG. EXAM: CT ABDOMEN AND PELVIS WITH CONTRAST TECHNIQUE: Multidetector CT imaging of the abdomen and pelvis was performed using the standard protocol following bolus administration of intravenous contrast. CONTRAST:  49mL ISOVUE-300 IOPAMIDOL (ISOVUE-300) INJECTION 61% COMPARISON:  CT Abdomen and Pelvis 02/29/2016, and earlier. FINDINGS: Lower chest: Chronic mild cardiomegaly. No pericardial effusion. No pleural effusion. Increased dependent and bilateral posterior costophrenic angle opacity most resembles atelectasis. Hepatobiliary: Small volume of inferior perihepatic free fluid (series  2, image 39). The liver and gallbladder remain within normal limits. Pancreas: Negative. Spleen: Small volume of free fluid in the anterior left upper quadrant along the anterior aspect of the spleen (series 2, image 13). Splenic enhancement is normal. Adrenals/Urinary Tract: Normal adrenal glands. Both kidneys appear stable since 2017 and within normal limits. There is a right renal midpole simple cyst. Symmetric bilateral renal enhancement and contrast excretion. No hydronephrosis or hydroureter. Unremarkable bladder.  Chronic left pelvic phlebolith is unchanged. Stomach/Bowel: No free air in the upper abdomen. Negative stomach and duodenum. Oral contrast was administered but has only reached the proximal to mid jejunum. There are distal fluid-filled but nondilated small bowel loops. There is retained low-density stool throughout the ascending colon. The transverse colon is distended with gas to the splenic flexure. The descending colon is more decompressed with a smaller volume of gas and retained  stool. The sigmoid colon and rectum are largely decompressed. There is abnormal free fluid versus organizing fluid collection in the right lower quadrant inferior to the cecum and lateral to the appendix. The appendix does not contain much gas throughout its course, and contains hyperdense material instead, and measures at the upper limits of normal to mildly dilated, 7-8 millimeters. However, there is no phlegmon along the course of the appendix. Rather, the abnormal right lower quadrant fluid tracks along the right adnexa and into the cul-de-sac (series 2, image 69). There is associated peritoneal thickening or organization of the fluid. No extraluminal gas is identified. See additional uterus and ovary findings below. Vascular/Lymphatic: Mild Calcified aortic atherosclerosis. Major arterial structures in the abdomen and pelvis are patent. Portal venous system is patent. No lymphadenopathy. Reproductive: Mild uterine enlargement is stable since 2017. A small dystrophic calcification along the anterior fundus is unchanged, without definite associated uterine fibroid. Both adnexa regions are indistinct. The right adnexa region is inseparable from the abnormal right lower quadrant fluid which tracks into the cul-de-sac. On series 2, image 65 there is a 16 millimeter small rim enhancing area of fluid which does not seem related to bowel. Musculoskeletal: Dextroconvex thoracic scoliosis. No acute osseous abnormality identified. IMPRESSION: 1. Scattered free fluid in the abdomen, the largest collection of which is in the right lower quadrant and appears to track along the right adnexa into the pelvic cul-de-sac. There is associated mild peritoneal or rim enhancement along the course of the dominant collection suggesting this is a developing Abscess. There is more free flowing/reactive appearing fluid in the upper abdomen adjacent to the liver and spleen. 2. The origin of #1 is unclear. Pelvic inflammatory disease (PID)  is favored, with the secondary consideration being acute appendicitis. 3. Evidence of associated small and large bowel ileus, but no other discrete bowel inflammation. 4. Bilateral lung base opacity favored to be atelectasis although developing infection would be difficult to exclude. No pleural effusion. 5. Chronic mild cardiomegaly. Electronically Signed   By: Genevie Ann M.D.   On: 02/01/2018 16:21   [place] Ascites (abdomen Limited)  Result Date: 02/03/2018 CLINICAL DATA:  Intra-abdominal fluid, please perform ultrasound-guided paracentesis. EXAM: LIMITED ABDOMEN ULTRASOUND FOR ASCITES TECHNIQUE: Limited ultrasound survey for ascites was performed in all four abdominal quadrants. COMPARISON:  CT abdomen pelvis - 02/01/2018 FINDINGS: Sonographic evaluation of the abdomen, augmented by the dictating interventional radiologist demonstrates only a trace amount of fluid within the right lower abdominal quadrant, too small to allow for safe ultrasound-guided paracentesis. IMPRESSION: Small amount of intra-abdominal fluid, too small to allow for safe ultrasound-guided paracentesis.  Above was discussed with ordering physician, Dr. [place] Pink - given concern for potential developing abdominal/pelvic abscess, repeat CT scan of the abdomen and pelvis was recommended as CT-guided aspiration and/or drainage catheter placement may ultimately be required. Electronically Signed   By: Sandi Mariscal M.D.   On: 02/03/2018 14:35    DISCHARGE EXAMINATION: Vitals:   02/04/18 2034 02/05/18 1433 02/05/18 2108 02/06/18 0511  BP: 120/66 115/70 120/72 (!) 106/59  Pulse: 62 76 72 66  Resp: 16 20 18 18   Temp: 99.5 F (37.5 C) 98.5 F (36.9 C) 99.8 F (37.7 C) 98.5 F (36.9 C)  TempSrc: Oral Oral Oral Oral  SpO2: 98% 97% 98% 96%  Weight:      Height:       General appearance: alert, cooperative, appears stated age and no distress Resp: clear to auscultation bilaterally Cardio: regular rate and rhythm, S1, S2 normal, no murmur,  click, rub or gallop GI: Abdomen is soft.  Remains mildly tender without any rebound rigidity or guarding.  Bowel sounds present normal.  DISPOSITION: Home  Discharge Instructions    Call MD for:  extreme fatigue   Complete by:  As directed    Call MD for:  persistant dizziness or light-headedness   Complete by:  As directed    Call MD for:  persistant nausea and vomiting   Complete by:  As directed    Call MD for:  severe uncontrolled pain   Complete by:  As directed    Call MD for:  temperature >100.4   Complete by:  As directed    Diet general   Complete by:  As directed    Discharge instructions   Complete by:  As directed    Please be sure to keep your appointment with the women's clinic at [facility] on March 6 at 3:15 PM.  You were cared for by a hospitalist during your hospital stay. If you have any questions about your discharge medications or the care you received while you were in the hospital after you are discharged, you can call the unit and asked to speak with the hospitalist on call if the hospitalist that took care of you is not available. Once you are discharged, your primary care physician will handle any further medical issues. Please note that NO REFILLS for any discharge medications will be authorized once you are discharged, as it is imperative that you return to your primary care physician (or establish a relationship with a primary care physician if you do not have one) for your aftercare needs so that they can reassess your need for medications and monitor your lab values. If you do not have a primary care physician, you can call [phone] for a physician referral.   Increase activity slowly   Complete by:  As directed         Allergies as of 02/06/2018   No Known Allergies     Medication List    STOP taking these medications   nitrofurantoin (macrocrystal-monohydrate) 100 MG capsule Commonly known as:  MACROBID   potassium chloride 10 MEQ  tablet Commonly known as:  K-DUR     TAKE these medications   doxycycline 100 MG tablet Commonly known as:  VIBRA-TABS Take 1 tablet (100 mg total) by mouth every 12 (twelve) hours for 14 days.   ferrous sulfate 325 (65 FE) MG tablet Take 1 tablet (325 mg total) by mouth 2 (two) times daily with a meal.   Heating Pad Dry Heat  Pads Use as directed   metroNIDAZOLE 500 MG tablet Commonly known as:  FLAGYL Take 1 tablet (500 mg total) by mouth every 12 (twelve) hours for 14 days.   naproxen 500 MG tablet Commonly known as:  NAPROSYN Take 500 mg by mouth 2 (two) times daily.        Follow-up [facility] for [place] Follow up on 02/14/2018.   Specialty:  Obstetrics and Gynecology Why:  3:15 pm for follow up Contact information: [place] [phone]          TOTAL DISCHARGE TIME: 74 minutes  Bonnielee Haff  [facility] Pager [phone]  02/06/2018, 2:27 PM

## 2018-02-14 ENCOUNTER — Encounter: Payer: Medicaid Other | Admitting: Obstetrics & Gynecology

## 2018-03-12 DIAGNOSIS — K56600 Partial intestinal obstruction, unspecified as to cause: Secondary | ICD-10-CM

## 2018-03-12 HISTORY — DX: Partial intestinal obstruction, unspecified as to cause: K56.600

## 2018-03-19 ENCOUNTER — Other Ambulatory Visit: Payer: Self-pay

## 2018-03-19 ENCOUNTER — Inpatient Hospital Stay (HOSPITAL_COMMUNITY)
Admission: EM | Admit: 2018-03-19 | Discharge: 2018-03-25 | DRG: 389 | Disposition: A | Discharge status: Home / Self Care | Payer: Medicaid Other | Attending: Internal Medicine | Admitting: Internal Medicine

## 2018-03-19 ENCOUNTER — Encounter (HOSPITAL_COMMUNITY): Payer: Self-pay

## 2018-03-19 ENCOUNTER — Emergency Department (HOSPITAL_COMMUNITY): Payer: Medicaid Other

## 2018-03-19 DIAGNOSIS — D259 Leiomyoma of uterus, unspecified: Secondary | ICD-10-CM | POA: Diagnosis present

## 2018-03-19 DIAGNOSIS — N938 Other specified abnormal uterine and vaginal bleeding: Secondary | ICD-10-CM | POA: Diagnosis present

## 2018-03-19 DIAGNOSIS — Z8249 Family history of ischemic heart disease and other diseases of the circulatory system: Secondary | ICD-10-CM | POA: Diagnosis not present

## 2018-03-19 DIAGNOSIS — E876 Hypokalemia: Secondary | ICD-10-CM | POA: Diagnosis present

## 2018-03-19 DIAGNOSIS — K5669 Other partial intestinal obstruction: Secondary | ICD-10-CM | POA: Diagnosis present

## 2018-03-19 DIAGNOSIS — G894 Chronic pain syndrome: Secondary | ICD-10-CM | POA: Diagnosis present

## 2018-03-19 DIAGNOSIS — D509 Iron deficiency anemia, unspecified: Secondary | ICD-10-CM | POA: Diagnosis present

## 2018-03-19 DIAGNOSIS — Z79891 Long term (current) use of opiate analgesic: Secondary | ICD-10-CM | POA: Diagnosis not present

## 2018-03-19 DIAGNOSIS — T402X5A Adverse effect of other opioids, initial encounter: Secondary | ICD-10-CM | POA: Diagnosis present

## 2018-03-19 DIAGNOSIS — Z0189 Encounter for other specified special examinations: Secondary | ICD-10-CM

## 2018-03-19 DIAGNOSIS — K5903 Drug induced constipation: Secondary | ICD-10-CM | POA: Diagnosis present

## 2018-03-19 DIAGNOSIS — K598 Other specified functional intestinal disorders: Secondary | ICD-10-CM | POA: Diagnosis present

## 2018-03-19 DIAGNOSIS — R109 Unspecified abdominal pain: Secondary | ICD-10-CM | POA: Diagnosis not present

## 2018-03-19 DIAGNOSIS — N390 Urinary tract infection, site not specified: Secondary | ICD-10-CM | POA: Diagnosis present

## 2018-03-19 DIAGNOSIS — K56600 Partial intestinal obstruction, unspecified as to cause: Secondary | ICD-10-CM | POA: Diagnosis present

## 2018-03-19 DIAGNOSIS — F17210 Nicotine dependence, cigarettes, uncomplicated: Secondary | ICD-10-CM | POA: Diagnosis present

## 2018-03-19 DIAGNOSIS — K56609 Unspecified intestinal obstruction, unspecified as to partial versus complete obstruction: Secondary | ICD-10-CM

## 2018-03-19 DIAGNOSIS — R140 Abdominal distension (gaseous): Secondary | ICD-10-CM

## 2018-03-19 DIAGNOSIS — Z833 Family history of diabetes mellitus: Secondary | ICD-10-CM | POA: Diagnosis not present

## 2018-03-19 HISTORY — DX: Partial intestinal obstruction, unspecified as to cause: K56.600

## 2018-03-19 LAB — URINALYSIS, ROUTINE W REFLEX MICROSCOPIC
Bilirubin Urine: NEGATIVE
Glucose, UA: NEGATIVE mg/dL
Ketones, ur: 5 mg/dL — AB
Leukocytes, UA: NEGATIVE
Nitrite: POSITIVE — AB
Protein, ur: 30 mg/dL — AB
Specific Gravity, Urine: 1.026 (ref 1.005–1.030)
Squamous Epithelial / LPF: NONE SEEN
pH: 5 (ref 5.0–8.0)

## 2018-03-19 LAB — CBC
HCT: 37.4 % (ref 36.0–46.0)
Hemoglobin: 11.9 g/dL — ABNORMAL LOW (ref 12.0–15.0)
MCH: 27.7 pg (ref 26.0–34.0)
MCHC: 31.8 g/dL (ref 30.0–36.0)
MCV: 87 fL (ref 78.0–100.0)
Platelets: 295 10*3/uL (ref 150–400)
RBC: 4.3 MIL/uL (ref 3.87–5.11)
RDW: 21.3 % — ABNORMAL HIGH (ref 11.5–15.5)
WBC: 11.6 10*3/uL — ABNORMAL HIGH (ref 4.0–10.5)

## 2018-03-19 LAB — COMPREHENSIVE METABOLIC PANEL
ALT: 14 U/L (ref 14–54)
AST: 12 U/L — ABNORMAL LOW (ref 15–41)
Albumin: 3.7 g/dL (ref 3.5–5.0)
Alkaline Phosphatase: 79 U/L (ref 38–126)
Anion gap: 11 (ref 5–15)
BUN: 5 mg/dL — ABNORMAL LOW (ref 6–20)
CO2: 20 mmol/L — ABNORMAL LOW (ref 22–32)
Calcium: 9.1 mg/dL (ref 8.9–10.3)
Chloride: 104 mmol/L (ref 101–111)
Creatinine, Ser: 0.63 mg/dL (ref 0.44–1.00)
GFR calc Af Amer: >60 mL/min (ref >60)
GFR calc non Af Amer: >60 mL/min (ref >60)
Glucose, Bld: 111 mg/dL — ABNORMAL HIGH (ref 65–99)
Potassium: 3.6 mmol/L (ref 3.5–5.1)
Sodium: 135 mmol/L (ref 135–145)
Total Bilirubin: 0.7 mg/dL (ref 0.3–1.2)
Total Protein: 7 g/dL (ref 6.5–8.1)

## 2018-03-19 LAB — I-STAT BETA HCG BLOOD, ED (MC, WL, AP ONLY): I-stat hCG, quantitative: <5 m[IU]/mL (ref <5)

## 2018-03-19 LAB — LIPASE, BLOOD: Lipase: 16 U/L (ref 11–51)

## 2018-03-19 MED ORDER — FENTANYL CITRATE (PF) 100 MCG/2ML IJ SOLN
50.0000 ug | Freq: Once | INTRAMUSCULAR | Status: AC
Start: 2018-03-19 — End: 2018-03-19
  Administered 2018-03-19: 50 ug via INTRAVENOUS
  Filled 2018-03-19: qty 2

## 2018-03-19 MED ORDER — ACETAMINOPHEN 650 MG RE SUPP: 650.0000 mg | Freq: Four times a day (QID) | RECTAL | Status: DC | PRN

## 2018-03-19 MED ORDER — KETOROLAC TROMETHAMINE 30 MG/ML IJ SOLN
30.0000 mg | Freq: Once | INTRAMUSCULAR | Status: AC
  Administered 2018-03-19: 30 mg via INTRAVENOUS
  Filled 2018-03-19: qty 1

## 2018-03-19 MED ORDER — POLYETHYLENE GLYCOL 3350 17 G PO PACK
17.0000 g | PACK | Freq: Two times a day (BID) | ORAL | Status: DC
  Administered 2018-03-19 – 2018-03-23 (×5): 17 g via ORAL
  Filled 2018-03-19 (×9): qty 1

## 2018-03-19 MED ORDER — MORPHINE SULFATE (PF) 4 MG/ML IV SOLN
4.0000 mg | Freq: Once | INTRAVENOUS | Status: AC
  Administered 2018-03-19: 4 mg via INTRAVENOUS
  Filled 2018-03-19: qty 1

## 2018-03-19 MED ORDER — FLEET ENEMA 7-19 GM/118ML RE ENEM: 1.0000 | ENEMA | Freq: Once | RECTAL | Status: DC | PRN

## 2018-03-19 MED ORDER — ONDANSETRON HCL 4 MG/2ML IJ SOLN
4.0000 mg | Freq: Once | INTRAMUSCULAR | Status: AC
  Administered 2018-03-19: 4 mg via INTRAVENOUS
  Filled 2018-03-19: qty 2

## 2018-03-19 MED ORDER — CEFTRIAXONE SODIUM 1 G IJ SOLR
1.0000 g | INTRAMUSCULAR | Status: DC
  Administered 2018-03-19 – 2018-03-23 (×5): 1 g via INTRAVENOUS
  Filled 2018-03-19 (×5): qty 10

## 2018-03-19 MED ORDER — LUBIPROSTONE 24 MCG PO CAPS
24.0000 ug | ORAL_CAPSULE | Freq: Two times a day (BID) | ORAL | Status: DC
  Administered 2018-03-20 (×2): 24 ug via ORAL
  Filled 2018-03-19 (×3): qty 1

## 2018-03-19 MED ORDER — BISACODYL 10 MG RE SUPP
10.0000 mg | Freq: Every day | RECTAL | Status: DC
  Administered 2018-03-19 – 2018-03-22 (×3): 10 mg via RECTAL
  Filled 2018-03-19 (×3): qty 1

## 2018-03-19 MED ORDER — ACETAMINOPHEN 325 MG PO TABS
650.0000 mg | ORAL_TABLET | Freq: Four times a day (QID) | ORAL | Status: DC | PRN
  Administered 2018-03-20 – 2018-03-21 (×2): 650 mg via ORAL
  Filled 2018-03-19 (×2): qty 2

## 2018-03-19 MED ORDER — ONDANSETRON HCL 4 MG PO TABS: 4.0000 mg | ORAL_TABLET | Freq: Four times a day (QID) | ORAL | Status: DC | PRN

## 2018-03-19 MED ORDER — HYDROCODONE-ACETAMINOPHEN 5-325 MG PO TABS
1.0000 | ORAL_TABLET | ORAL | Status: DC | PRN
  Administered 2018-03-19 – 2018-03-22 (×11): 2 via ORAL
  Filled 2018-03-19 (×11): qty 2

## 2018-03-19 MED ORDER — IOPAMIDOL (ISOVUE-300) INJECTION 61%
100.0000 mL | Freq: Once | INTRAVENOUS | Status: AC | PRN
  Administered 2018-03-19: 100 mL via INTRAVENOUS

## 2018-03-19 MED ORDER — POTASSIUM CHLORIDE IN NACL 20-0.9 MEQ/L-% IV SOLN
INTRAVENOUS | Status: AC
  Administered 2018-03-19: 22:00:00 via INTRAVENOUS
  Filled 2018-03-19: qty 1000

## 2018-03-19 MED ORDER — IOPAMIDOL (ISOVUE-300) INJECTION 61%
INTRAVENOUS | Status: AC
  Filled 2018-03-19: qty 100

## 2018-03-19 MED ORDER — ONDANSETRON 4 MG PO TBDP
4.0000 mg | ORAL_TABLET | Freq: Once | ORAL | Status: AC | PRN
  Administered 2018-03-19: 4 mg via ORAL
  Filled 2018-03-19: qty 1

## 2018-03-19 MED ORDER — ONDANSETRON HCL 4 MG/2ML IJ SOLN: 4.0000 mg | Freq: Four times a day (QID) | INTRAMUSCULAR | Status: DC | PRN

## 2018-03-19 MED ORDER — HYDROMORPHONE HCL 1 MG/ML IJ SOLN
1.0000 mg | INTRAMUSCULAR | Status: DC | PRN
  Administered 2018-03-19 – 2018-03-20 (×4): 1 mg via INTRAVENOUS
  Filled 2018-03-19 (×4): qty 1

## 2018-03-19 NOTE — ED Provider Notes (Signed)
Patient placed in Quick Look pathway, seen and evaluated   Chief Complaint: Abdominal pain, nausea/vomiting  HPI:   42 y.o. F past medical history of small bowel obstruction who presents for evaluation of generalized abdominal pain.  Also reports nausea/vomiting.  Last bowel movement was yesterday.  Has a history of small bowel obstructions.  Recently admitted once along in February 2019 for abdominal pain.  CT scan at that time was concern for intra-abdominal abscess.  Patient was found to have an abscess near the cervix in March 2018.  Gynecology has been following patient for this abscess and started patient on antibiotics.  During her admission, IR was consulted and it was determined that there was no safe way of approaching fluid.  Patient also had ileus at that time.   ROS: Abdominal pain, nausea/vomiting  Physical Exam:   Gen: No distress  Neuro: Awake and Alert  Skin: Warm   Focused Exam: Abdomen slightly distended.  Generalized tenderness with no focal point of tenderness.  No increased tympany.  First look RN came to me and stated that patient was complaining of worsening pain.  Additionally, patient's family had reported that when patient went to the bathroom, she had an episode of vaginal bleeding.  I was asked to come evaluate patient in the triage room and discussed with patient regarding with time.  I informed patient and family that we were actively trying to get a room but given consensus in ED, we had limited availability.  Abdomen exam shows slight distention which appears to be chronic in nature as it is mentioned in multiple physical exams throughout patient's history.  Patient is not actively having vaginal bleeding or hemorrhaging.  There is no blood noted on patient's close, chair.  Patient reports that she only had the episode when she went to the bathroom.  I reviewed patient's labs that had been previously drawn.  Hemoglobin is stable at 11.9.  Hematocrit is 37.4.  CBC shows  slight leukocytosis.  Labs otherwise unremarkable.  I-STAT beta is negative.  Given patient's history, will plan for CT and pelvis for evaluation of small bowel obstruction.  I informed patient and family of updated plan.  They expressed understanding and agreement.  Initiation of care has begun. The patient has been counseled on the process, plan, and necessity for staying for the completion/evaluation, and the remainder of the medical screening examination    Desma Mcgregor 03/19/18 1557    Isla Pence, MD 03/19/18 [phone]

## 2018-03-19 NOTE — H&P (Signed)
History and Physical    [name] QQV:[id number] DOB: 1976/07/26 DOA: 03/19/2018  PCP: [facility]   Patient coming from: Home  Chief Complaint: Abd pain and distension, constipation, vaginal bleeding   HPI: [name] is a 42 y.o. female with medical history significant for chronic pain with narcotic dependence, dysfunctional uterine bleeding attributed to fibroids, and chronic iron deficiency anemia, now presented to the emergency department for evaluation of abdominal distention and pain.  Patient reports that she has noted the insidious development of distention of her abdomen as well as diffuse abdominal pain over the past few days.  She reports nausea without vomiting and denies diarrhea.  She reports last bowel movement was 6 days ago despite using MiraLAX at home.  She reports ongoing vaginal bleeding for which she follows with OB/GYN.  She describes her pain is diffuse, severe, without alleviating or exacerbating factors, and similar to her prior experiences with SBO.  She was admitted in February of this year with SBO, noted to have a possible abscess on CT that was not amenable to IR drainage, she improved and followed with OB/GYN on discharge and was treated with a course of antibiotics.  Her symptoms had completely resolved prior to the development of her new complaints for which she presents today.  ED Course: Upon arrival to the ED, patient is found to be afebrile, saturating well on room air, and with vitals normal.  Chemistry panel is unremarkable and CBC features a leukocytosis to 11,600.  Urinalysis suggests a possible infection.  CT of the abdomen and pelvis findings suggest a possible developing or partial SBO with possible mild colitis in the right and proximal transverse colon, as well as persistent fluid in the pelvis.  Patient was treated with fentanyl, morphine, and Zofran in the ED.  ED physician discussed the case with surgery who reviewed the CT scan and feels  this is likely opiate induced constipation, recommends aggressive bowel regimen, and advises against any surgical intervention for the persistent pelvic fluid collection.  She remains hemodynamically stable, in no apparent respiratory distress, and will be admitted to the medical-surgical unit for ongoing evaluation and management of abdominal pain with distention, possibly reflecting a developing SBO.  Review of Systems:  All other systems reviewed and apart from HPI, are negative.  Past Medical History:  Diagnosis Date  . Anemia   . Cervical disc disease   . Fibroids   . Heart murmur   . Low back pain     Past Surgical History:  Procedure Laterality Date  . ANKLE SURGERY    . CESAREAN SECTION    . TUBAL LIGATION       reports that she has been smoking cigarettes.  She has been smoking about 0.50 packs per day. She has never used smokeless tobacco. She reports that she does not drink alcohol or use drugs.  No Known Allergies  Family History  Problem Relation Age of Onset  . Diabetes Maternal Grandmother   . Hypertension Maternal Aunt   . Bowel Disease Mother      Prior to Admission medications   Medication Sig Start Date End Date Taking? Authorizing Provider  Ibuprofen-diphenhydrAMINE Cit (ADVIL PM PO) Take 1 tablet by mouth once.   Yes [provider]  lubiprostone (AMITIZA) 24 MCG capsule Take 24 mcg by mouth 2 (two) times daily with a meal.   Yes [provider]  doxycycline (VIBRA-TABS) 100 MG tablet Take 100 mg by mouth 2 (two)  times daily.    [provider]  ferrous sulfate 325 (65 FE) MG tablet Take 1 tablet (325 mg total) by mouth 2 (two) times daily with a meal. Patient not taking: Reported on 03/19/2018 02/06/18   Bonnielee Haff, MD  Heating Pads (HEATING PAD DRY HEAT) PADS Use as directed 02/06/18   Bonnielee Haff, MD    Physical Exam: Vitals:   03/19/18 1223 03/19/18 1231 03/19/18 1536 03/19/18 1952  BP:  (!) 142/86 (!) 156/97 (!)  152/97  Pulse:  74 69 80  Resp:  18 16 18   Temp:  98.4 F (36.9 C)  98.2 F (36.8 C)  TempSrc:  Oral  Oral  SpO2:  99% 100% 99%  Weight: 69.4 kg (153 lb)     Height: 5\' 4"  (1.626 m)         Constitutional: NAD, calm, in apparent discomfort Eyes: PERTLA, lids and conjunctivae normal ENMT: Mucous membranes are moist. Posterior pharynx clear of any exudate or lesions.   Neck: normal, supple, no masses, no thyromegaly Respiratory: clear to auscultation bilaterally, no wheezing, no crackles. Normal respiratory effort.    Cardiovascular: S1 & S2 heard, regular rate and rhythm. No significant JVD. Abdomen: Mild distension, soft, diffuse tenderness without rebound pain or guarding. Occasional high-pitched bowel sound.   Musculoskeletal: no clubbing / cyanosis. No joint deformity upper and lower extremities.    Skin: no significant rashes, lesions, ulcers. Warm, dry, well-perfused. Neurologic: CN 2-12 grossly intact. Sensation intact. Strength 5/5 in all 4 limbs.  Psychiatric:  Alert and oriented x 3. Calm, cooperative.     Labs on Admission: I have personally reviewed following labs and imaging studies  CBC: Recent Labs  Lab 03/19/18 1230  WBC 11.6*  HGB 11.9*  HCT 37.4  MCV 87.0  PLT 782   Basic Metabolic Panel: Recent Labs  Lab 03/19/18 1230  NA 135  K 3.6  CL 104  CO2 20*  GLUCOSE 111*  BUN 5*  CREATININE 0.63  CALCIUM 9.1   GFR: Estimated Creatinine Clearance: 88.5 mL/min (by C-G formula based on SCr of 0.63 mg/dL). Liver Function Tests: Recent Labs  Lab 03/19/18 1230  AST 12*  ALT 14  ALKPHOS 79  BILITOT 0.7  PROT 7.0  ALBUMIN 3.7   Recent Labs  Lab 03/19/18 1230  LIPASE 16   No results for input(s): AMMONIA in the last 168 hours. Coagulation Profile: No results for input(s): INR, PROTIME in the last 168 hours. Cardiac Enzymes: No results for input(s): CKTOTAL, CKMB, CKMBINDEX, TROPONINI in the last 168 hours. BNP (last 3 results) No results  for input(s): PROBNP in the last 8760 hours. HbA1C: No results for input(s): HGBA1C in the last 72 hours. CBG: No results for input(s): GLUCAP in the last 168 hours. Lipid Profile: No results for input(s): CHOL, HDL, LDLCALC, TRIG, CHOLHDL, LDLDIRECT in the last 72 hours. Thyroid Function Tests: No results for input(s): TSH, T4TOTAL, FREET4, T3FREE, THYROIDAB in the last 72 hours. Anemia Panel: No results for input(s): VITAMINB12, FOLATE, FERRITIN, TIBC, IRON, RETICCTPCT in the last 72 hours. Urine analysis:    Component Value Date/Time   COLORURINE RED (A) 03/19/2018 1611   APPEARANCEUR TURBID (A) 03/19/2018 1611   LABSPEC 1.026 03/19/2018 1611   PHURINE 5.0 03/19/2018 1611   GLUCOSEU NEGATIVE 03/19/2018 1611   HGBUR MODERATE (A) 03/19/2018 1611   BILIRUBINUR NEGATIVE 03/19/2018 1611   KETONESUR 5 (A) 03/19/2018 1611   PROTEINUR 30 (A) 03/19/2018 1611   UROBILINOGEN 0.2 08/25/2015 1220  NITRITE POSITIVE (A) 03/19/2018 1611   LEUKOCYTESUR NEGATIVE 03/19/2018 1611   Sepsis Labs: @LABRCNTIP (procalcitonin:4,lacticidven:4) )No results found for this or any previous visit (from the past 240 hour(s)).   Radiological Exams on Admission: Ct Abdomen Pelvis W Contrast  Result Date: 03/19/2018 CLINICAL DATA:  Nausea abdominal pain EXAM: CT ABDOMEN AND PELVIS WITH CONTRAST TECHNIQUE: Multidetector CT imaging of the abdomen and pelvis was performed using the standard protocol following bolus administration of intravenous contrast. CONTRAST:  174mL ISOVUE-300 IOPAMIDOL (ISOVUE-300) INJECTION 61% COMPARISON:  CT 02/03/2018, 02/01/2018, 03/08/2016 FINDINGS: Lower chest: Lung bases demonstrate hazy atelectasis. No pleural effusion or focal consolidation. Heart size upper normal. Hepatobiliary: No focal liver abnormality is seen. No gallstones, gallbladder wall thickening, or biliary dilatation. Pancreas: Unremarkable. No pancreatic ductal dilatation or surrounding inflammatory changes. Spleen:  Normal in size without focal abnormality. Adrenals/Urinary Tract: Adrenal glands are within normal limits. Cyst in the midpole of the right kidney. Bladder unremarkable Stomach/Bowel: Stomach is nonenlarged. Fluid filled, slightly dilated central small bowel, measuring up to 3.3 cm with decompressed distal small bowel. Possible areas of colon wall thickening involving the ascending colon and hepatic flexure. Appendix upper normal in size with intraluminal radiopaque material but similar morphology compared to prior CT. Vascular/Lymphatic: Nonaneurysmal aorta. Stable subcentimeter retroperitoneal lymph nodes. Reproductive: Uterus unremarkable. 3 cm probable cyst in the right adnexa. Other: Negative for free air. Loculated ascites in the pelvis with less pronounced rim enhancement. Musculoskeletal: No acute or suspicious abnormality. IMPRESSION: 1. Slightly enlarged fluid-filled central small bowel loops with decompressed distal small bowel, may reflect developing or partial small bowel obstruction. 2. Possible mild colitis type changes involving the right colon and proximal transverse colon 3. Slight increased loculated appearing ascites within the pelvis and right gutter, but with decreased peripheral/rim enhancement compared to prior CT. Electronically Signed   By: Donavan Foil M.D.   On: 03/19/2018 17:51    EKG: Not performed.   Assessment/Plan  1. Partial SBO  - Presents with abdominal distension, abd pain, and nausea - Last BM six days ago despite using MiraLax at home  - CT abd/pelivs findings suggest partial/early SBO  - ED physician discussed with gen surgery who reviewed the scan and feels likely constipation d/t opiates and recommends aggressive bowel regimen  - Treated in ED with Fentanyl, morphine, and Zofran  - Start bowel-regimen and escalate as needed, start IVF hydration, continue supportive care with prn analgesia and antiemetics    2. UTI   - UA suggests possible UTI; she complains  of abdominal pain, but that is suspected secondary to constipation, partial SBO  - Culture urine, start empiric Rocephin   3. Dysfunctional uterine bleeding   - Following with OBGYN, attributed to fibroids  - H&H are improved, will trend     DVT prophylaxis: SCD's  Code Status: Full  Family Communication: Discussed with patient Consults called: EDP discussed with surgery  Admission status: Inpatient    Vianne Bulls, MD [facility] Pager [phone]  If 7PM-7AM, please contact night-coverage [email or web address] Password [email or web address]  03/19/2018, 9:05 PM

## 2018-03-19 NOTE — ED Notes (Signed)
Pt brought into triage 1 room to be given oral zofran from vomiting in lobby. PA seeing pt in triage room.

## 2018-03-19 NOTE — ED Notes (Signed)
Family up to desk several times requesting pain medicine for the pt. Explained to them that the pt would have to wait to see the Dr since she has already been given pain meds and zofran earlier. Pt asking to speak to charge nurse to ask about pain meds

## 2018-03-19 NOTE — ED Notes (Signed)
Spoke with Charge Rn and Melissa AD, Pt.s family has requested to speak to a patient advocate concerning wait times.   Apologized pt. And family for the wait time.

## 2018-03-19 NOTE — ED Provider Notes (Signed)
[place] EMERGENCY DEPARTMENT Provider Note   CSN: [id number] Arrival date & time: 03/19/18  1157     History   Chief Complaint Chief Complaint  Patient presents with  . Abdominal Pain    HPI [name] is a 42 y.o. female.  42yo F w/ PMH including fibroids, chronic narcotic use w/ chronic pain, recurrent SBO who p/w abdominal pain. She was admitted to the hospital on 02/01/18 for abdominal pain and CT showing fluid collection concerning for abscess.  She was discharged on antibiotics which she finished and she followed up with GYN.  She reports that she had improved but yesterday she began having intermittent, generalized abdominal pain again.  The pain is severe, crampy and burning.  No association with eating or drinking.  She has had associated nausea but no vomiting, diarrhea, or fevers.  No urinary symptoms.  She has had constipation with last bowel movement 6 days ago despite taking Senokot and MiraLAX.  She notes that today she began having heavy vaginal bleeding, does have a history of fibroids.  This pain is similar to previous pain with bowel obstructions.  The history is provided by the patient.  Abdominal Pain      Past Medical History:  Diagnosis Date  . Anemia   . Cervical disc disease   . Fibroids   . Heart murmur   . Low back pain     Patient Active Problem List   Diagnosis Date Noted  . Intra-abdominal abscess ([place]) 02/01/2018  . Small bowel obstruction ([place]) 02/29/2016  . Recurrent abdominal pain 02/29/2016  . Chronic anemia 02/29/2016  . Chronic constipation 02/19/2016  . Narcotic dependence ([place]) 02/19/2016  . Ileus ([place]) 02/08/2016  . Chronic narcotic use 02/08/2016  . Anemia, iron deficiency   . Hypokalemia 01/29/2016  . Iron deficiency anemia due to chronic blood loss 01/29/2016  . Anemia 01/29/2016  . Abdominal pain 01/09/2016  . Generalized abdominal pain 01/09/2016  . Nausea with vomiting 01/09/2016  . Tobacco  abuse 01/09/2016  . Abdominal pain in female   . Constipation     Past Surgical History:  Procedure Laterality Date  . ANKLE SURGERY    . CESAREAN SECTION    . TUBAL LIGATION       OB History    Gravida  4   Para  4   Term      Preterm  4   AB      Living  4     SAB      TAB      Ectopic      Multiple      Live Births               Home Medications    Prior to Admission medications   Medication Sig Start Date End Date Taking? Authorizing Provider  Ibuprofen-diphenhydrAMINE Cit (ADVIL PM PO) Take 1 tablet by mouth once.   Yes [provider]  lubiprostone (AMITIZA) 24 MCG capsule Take 24 mcg by mouth 2 (two) times daily with a meal.   Yes [provider]  doxycycline (VIBRA-TABS) 100 MG tablet Take 100 mg by mouth 2 (two) times daily.    [provider]  ferrous sulfate 325 (65 FE) MG tablet Take 1 tablet (325 mg total) by mouth 2 (two) times daily with a meal. Patient not taking: Reported on 03/19/2018 02/06/18   Bonnielee Haff, MD  Heating Pads (HEATING PAD DRY HEAT) PADS Use as directed  02/06/18   Bonnielee Haff, MD    Family History Family History  Problem Relation Age of Onset  . Diabetes Maternal Grandmother   . Hypertension Maternal Aunt   . Bowel Disease Mother     Social History Social History   Tobacco Use  . Smoking status: Current Every Day Smoker    Packs/day: 0.50    Types: Cigarettes  . Smokeless tobacco: Never Used  Substance Use Topics  . Alcohol use: No    Alcohol/week: 0.0 oz  . Drug use: No     Allergies   Patient has no known allergies.   Review of Systems Review of Systems  Gastrointestinal: Positive for abdominal pain.   All other systems reviewed and are negative except that which was mentioned in HPI   Physical Exam Updated Vital Signs BP (!) 152/97 (BP Location: Right Arm)   Pulse 80   Temp 98.2 F (36.8 C) (Oral)   Resp 18   Ht 5\' 4"  (1.626 m)   Wt 69.4 kg (153 lb)   LMP  02/28/2018   SpO2 99%   BMI 26.26 kg/m   Physical Exam  Constitutional: She is oriented to person, place, and time. She appears well-developed and well-nourished.  In mild distress due to pain  HENT:  Head: Normocephalic and atraumatic.  Moist mucous membranes  Eyes: Pupils are equal, round, and reactive to light. Conjunctivae are normal.  Neck: Neck supple.  Cardiovascular: Normal rate, regular rhythm and normal heart sounds.  No murmur heard. Pulmonary/Chest: Effort normal and breath sounds normal.  Abdominal: Soft. She exhibits distension. Bowel sounds are decreased. There is generalized tenderness. There is guarding (voluntary). There is no rebound.  Musculoskeletal: She exhibits no edema.  Neurological: She is alert and oriented to person, place, and time.  Fluent speech  Skin: Skin is warm and dry.  Psychiatric: Judgment normal.  distressed  Nursing note and vitals reviewed.    ED Treatments / Results  Labs (all labs ordered are listed, but only abnormal results are displayed) Labs Reviewed  COMPREHENSIVE METABOLIC PANEL - Abnormal; Notable for the following components:      Result Value   CO2 20 (*)    Glucose, Bld 111 (*)    BUN 5 (*)    AST 12 (*)    All other components within normal limits  CBC - Abnormal; Notable for the following components:   WBC 11.6 (*)    Hemoglobin 11.9 (*)    RDW 21.3 (*)    All other components within normal limits  URINALYSIS, ROUTINE W REFLEX MICROSCOPIC - Abnormal; Notable for the following components:   Color, Urine RED (*)    APPearance TURBID (*)    Hgb urine dipstick MODERATE (*)    Ketones, ur 5 (*)    Protein, ur 30 (*)    Nitrite POSITIVE (*)    Bacteria, UA MANY (*)    All other components within normal limits  LIPASE, BLOOD  I-STAT BETA HCG BLOOD, ED ([facility], [facility], [facility] ONLY)    EKG None  Radiology Ct Abdomen Pelvis W Contrast  Result Date: 03/19/2018 CLINICAL DATA:  Nausea abdominal pain EXAM: CT ABDOMEN AND PELVIS  WITH CONTRAST TECHNIQUE: Multidetector CT imaging of the abdomen and pelvis was performed using the standard protocol following bolus administration of intravenous contrast. CONTRAST:  182mL ISOVUE-300 IOPAMIDOL (ISOVUE-300) INJECTION 61% COMPARISON:  CT 02/03/2018, 02/01/2018, 03/08/2016 FINDINGS: Lower chest: Lung bases demonstrate hazy atelectasis. No pleural effusion or focal consolidation. Heart size  upper normal. Hepatobiliary: No focal liver abnormality is seen. No gallstones, gallbladder wall thickening, or biliary dilatation. Pancreas: Unremarkable. No pancreatic ductal dilatation or surrounding inflammatory changes. Spleen: Normal in size without focal abnormality. Adrenals/Urinary Tract: Adrenal glands are within normal limits. Cyst in the midpole of the right kidney. Bladder unremarkable Stomach/Bowel: Stomach is nonenlarged. Fluid filled, slightly dilated central small bowel, measuring up to 3.3 cm with decompressed distal small bowel. Possible areas of colon wall thickening involving the ascending colon and hepatic flexure. Appendix upper normal in size with intraluminal radiopaque material but similar morphology compared to prior CT. Vascular/Lymphatic: Nonaneurysmal aorta. Stable subcentimeter retroperitoneal lymph nodes. Reproductive: Uterus unremarkable. 3 cm probable cyst in the right adnexa. Other: Negative for free air. Loculated ascites in the pelvis with less pronounced rim enhancement. Musculoskeletal: No acute or suspicious abnormality. IMPRESSION: 1. Slightly enlarged fluid-filled central small bowel loops with decompressed distal small bowel, may reflect developing or partial small bowel obstruction. 2. Possible mild colitis type changes involving the right colon and proximal transverse colon 3. Slight increased loculated appearing ascites within the pelvis and right gutter, but with decreased peripheral/rim enhancement compared to prior CT. Electronically Signed   By: Donavan Foil M.D.    On: 03/19/2018 17:51    Procedures Procedures (including critical care time)  Medications Ordered in ED Medications  iopamidol (ISOVUE-300) 61 % injection (has no administration in time range)  ondansetron (ZOFRAN-ODT) disintegrating tablet 4 mg (4 mg Oral Given 03/19/18 1541)  fentaNYL (SUBLIMAZE) injection 50 mcg (50 mcg Intravenous Given 03/19/18 1554)  iopamidol (ISOVUE-300) 61 % injection 100 mL (100 mLs Intravenous Contrast Given 03/19/18 1724)  ondansetron (ZOFRAN) injection 4 mg (4 mg Intravenous Given 03/19/18 1958)  morphine 4 MG/ML injection 4 mg (4 mg Intravenous Given 03/19/18 1957)     Initial Impression / Assessment and Plan / ED Course  I have reviewed the triage vital signs and the nursing notes.  Pertinent labs & imaging results that were available during my care of the patient were reviewed by me and considered in my medical decision making (see chart for details).    VS reassuring on arrival.  She had some abdominal distention and generalized tenderness.  Labs show WBC 11.6, reassuring CMP, UA containing blood likely from her vaginal bleeding.  CT shows developing or partial SBO, persistent fluid collection in the pelvis but with decreased rim enhancement compared to previous imaging.  I discussed with general surgery, Dr. Brantley Stage, who feels that fluid collection appears stable and he would not recommend any acute interventions.  He recommended medicine admission for medical treatment to resolve bowel obstruction including aggressive bowel regimen.  He suspects opiate-induced constipation.  Discussed admission with [facility], Dr. Myna Hidalgo, I appreciate his assistance. Pt admitted for further treatment.   Final Clinical Impressions(s) / ED Diagnoses   Final diagnoses:  Partial small bowel obstruction [facility])  Chronically on opiate therapy    ED Discharge Orders    None       Coury Grieger, Wenda Overland, MD 03/19/18 2032

## 2018-03-19 NOTE — ED Triage Notes (Addendum)
Pt reports "whole stomach is hurting and it moves around and is distended". Nausea, no vomiting, denies diarrhea, pt also endorses constipation stating the last BM was 6 days ago. Pt also reports she is having heavy vaginal bleeding LMP 02-28-18. Bleeding "just started today when the pain got worse". Denies using pads or tampons states "when I got to pee it just gushes out".

## 2018-03-19 NOTE — ED Notes (Addendum)
Patient's family is upset because of uncontrolled abdominal pain , nurse explained to pt. and family that EDP is aware on her persistent pain and pain medications were ordered . Dr. Rex Kras advised RN that pt. has a history of narcotic abuse and she will order Toradol .

## 2018-03-20 ENCOUNTER — Encounter (HOSPITAL_COMMUNITY): Payer: Self-pay | Admitting: General Practice

## 2018-03-20 LAB — COMPREHENSIVE METABOLIC PANEL
ALT: 11 U/L — ABNORMAL LOW (ref 14–54)
AST: 9 U/L — ABNORMAL LOW (ref 15–41)
Albumin: 3.1 g/dL — ABNORMAL LOW (ref 3.5–5.0)
Alkaline Phosphatase: 61 U/L (ref 38–126)
Anion gap: 9 (ref 5–15)
BUN: 8 mg/dL (ref 6–20)
CO2: 22 mmol/L (ref 22–32)
Calcium: 8.6 mg/dL — ABNORMAL LOW (ref 8.9–10.3)
Chloride: 105 mmol/L (ref 101–111)
Creatinine, Ser: 0.69 mg/dL (ref 0.44–1.00)
GFR calc Af Amer: >60 mL/min (ref >60)
GFR calc non Af Amer: >60 mL/min (ref >60)
Glucose, Bld: 94 mg/dL (ref 65–99)
Potassium: 3.5 mmol/L (ref 3.5–5.1)
Sodium: 136 mmol/L (ref 135–145)
Total Bilirubin: 0.6 mg/dL (ref 0.3–1.2)
Total Protein: 6.2 g/dL — ABNORMAL LOW (ref 6.5–8.1)

## 2018-03-20 LAB — CBC WITH DIFFERENTIAL/PLATELET
Basophils Absolute: 0 10*3/uL (ref 0.0–0.1)
Basophils Relative: 0 %
Eosinophils Absolute: 0.1 10*3/uL (ref 0.0–0.7)
Eosinophils Relative: 1 %
HCT: 33.3 % — ABNORMAL LOW (ref 36.0–46.0)
Hemoglobin: 10.6 g/dL — ABNORMAL LOW (ref 12.0–15.0)
Lymphocytes Relative: 28 %
Lymphs Abs: 2.4 10*3/uL (ref 0.7–4.0)
MCH: 27.9 pg (ref 26.0–34.0)
MCHC: 31.8 g/dL (ref 30.0–36.0)
MCV: 87.6 fL (ref 78.0–100.0)
Monocytes Absolute: 0.5 10*3/uL (ref 0.1–1.0)
Monocytes Relative: 6 %
Neutro Abs: 5.7 10*3/uL (ref 1.7–7.7)
Neutrophils Relative %: 65 %
Platelets: 274 10*3/uL (ref 150–400)
RBC: 3.8 MIL/uL — ABNORMAL LOW (ref 3.87–5.11)
RDW: 21.8 % — ABNORMAL HIGH (ref 11.5–15.5)
WBC: 8.7 10*3/uL (ref 4.0–10.5)

## 2018-03-20 LAB — MAGNESIUM: Magnesium: 2 mg/dL (ref 1.7–2.4)

## 2018-03-20 MED ORDER — FERROUS SULFATE 325 (65 FE) MG PO TABS
325.0000 mg | ORAL_TABLET | Freq: Every day | ORAL | Status: DC
  Administered 2018-03-20: 325 mg via ORAL
  Filled 2018-03-20: qty 1

## 2018-03-20 MED ORDER — ENOXAPARIN SODIUM 40 MG/0.4ML ~~LOC~~ SOLN
40.0000 mg | SUBCUTANEOUS | Status: DC
  Filled 2018-03-20 (×4): qty 0.4

## 2018-03-20 MED ORDER — NALOXONE HCL 0.4 MG/ML IJ SOLN: 0.4000 mg | INTRAMUSCULAR | Status: DC | PRN

## 2018-03-20 MED ORDER — HYDROMORPHONE HCL 1 MG/ML IJ SOLN
0.5000 mg | INTRAMUSCULAR | Status: DC | PRN
  Administered 2018-03-20 – 2018-03-23 (×34): 0.5 mg via INTRAVENOUS
  Filled 2018-03-20 (×34): qty 1

## 2018-03-20 MED ORDER — ENSURE ENLIVE PO LIQD
237.0000 mL | Freq: Two times a day (BID) | ORAL | Status: DC
  Administered 2018-03-20 – 2018-03-25 (×9): 237 mL via ORAL

## 2018-03-20 NOTE — Progress Notes (Addendum)
PROGRESS NOTE  DIMPLES PROBUS INO:[id number] DOB: [birth year] DOA: 03/19/2018 PCP: [facility]  HPI/Recap of past 24 hours: [name] is a 42 y.o. female with medical history significant for chronic pain with narcotic dependence, dysfunctional uterine bleeding attributed to fibroids, and chronic iron deficiency anemia, now presented to the emergency department for evaluation of abdominal distention and pain.  Patient reports that she has noted the insidious development of distention of her abdomen as well as diffuse abdominal pain over the past few days.  She reports nausea without vomiting and denies diarrhea.  She reports last bowel movement was 6 days ago despite using MiraLAX at home.  She reports ongoing vaginal bleeding for which she follows with OB/GYN.  She describes her pain is diffuse, severe, without alleviating or exacerbating factors, and similar to her prior experiences with SBO.  She was admitted in February of this year with SBO, noted to have a possible abscess on CT that was not amenable to IR drainage, she improved and followed with OB/GYN on discharge and was treated with a course of antibiotics.  Her symptoms had completely resolved prior to the development of her new complaints for which she presents today.  03/20/18: Patient seen and examined at her bedside.  She reports pain in her abdomen which is diffuse achy 9 out of 10.  States that the pain medication is not helping much, would like to have a closer interval.  She denies any nausea or vomiting at this time.  The patient is currently n.p.o.  GI has been consulted.  Denies flatus.  Update: Patient had 2 small bowel movements this afternoon.  Good bowel sounds.  Spoke with Dr. Carma Leaven who recommended starting the patient on a full liquid diet and resuming MiraLAX.  Patient is afebrile with no leukocytosis.  Suspect her symptoms are related to opiates induced constipation.  Patient adamant about receiving IV opiates every  hour.  Informed that her pain management is being done adequately and safely.  Will monitor her symptoms and possibly discharge in the morning if no fever or leukocytosis overnight.  Assessment/Plan: Principal Problem:   Partial small bowel obstruction (HCC) Active Problems:   Abdominal pain in female   Constipation due to opioid therapy   Acute lower UTI   Dysfunctional uterine bleeding   Suspected partial small bowel obstruction Possibly secondary to severe constipation opiates induced CT abdomen and pelvis revealed slightly enlarged fluid filled central small bowel loops with decompressed distal small bowel may reflect developing or partial small bowel obstruction.  And also possible mild colitis involving the right colon and proximal transverse colon. GI has been consulted.  Highly appreciated Continue n.p.o. Continue pain management On IV Dilaudid 0.5 mg every 2 hours as needed for severe pain Bowel regimen in place when n.p.o. is no longer  Iron deficiency anemia Baseline hemoglobin 11 Hemoglobin 10.6 no overt sign of bleeding Started daily p.o. 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 Cristina Gong outpatient  UTI, present on admission IV ceftriaxone Urine culture pending   Code Status: Full code  Family Communication: None at bedside  Disposition Plan: Home when clinically stable   Consultants:  GI  Procedures: None Antimicrobials:  None  DVT prophylaxis: SCDs, subcu Lovenox daily   Objective: Vitals:   03/19/18 2215 03/19/18 2246 03/20/18 0510  03/20/18 1038  BP: 124/74 111/73 106/65 (!) 101/57  Pulse: 81 63 (!) 52 (!) 58  Resp:    14  Temp:  98.4 F (36.9 C) 98.4 F (36.9 C) 98.3 F (36.8 C)  TempSrc:  Oral Oral Axillary  SpO2: 97%  99% 99% 90%  Weight:      Height:        Intake/Output Summary (Last 24 hours) at 03/20/2018 1336 Last data filed at 03/20/2018 1005 Gross per 24 hour  Intake 100 ml  Output -  Net 100 ml   Filed Weights   03/19/18 1223  Weight: 69.4 kg (153 lb)    Exam:   General: 42 year old African-American female well-developed well-nourished in no acute distress.  Alert and oriented x3.  Cardiovascular: Regular rate and rhythm with no rubs or gallops.  No JVD or thyromegaly.  Respiratory: Clear to auscultation with no wheezes or rales.  Abdomen: Diffusely tender with normal bowel sounds x4 quadrant.  Musculoskeletal: No focal motor deficits.  Skin: No ulcerative lesions  Psychiatry: Mood is anxious and irritable.   Data Reviewed: CBC: Recent Labs  Lab 03/19/18 1230 03/20/18 0504  WBC 11.6* 8.7  NEUTROABS  --  5.7  HGB 11.9* 10.6*  HCT 37.4 33.3*  MCV 87.0 87.6  PLT 295 939   Basic Metabolic Panel: Recent Labs  Lab 03/19/18 1230 03/20/18 0504  NA 135 136  K 3.6 3.5  CL 104 105  CO2 20* 22  GLUCOSE 111* 94  BUN 5* 8  CREATININE 0.63 0.69  CALCIUM 9.1 8.6*  MG  --  2.0   GFR: Estimated Creatinine Clearance: 88.5 mL/min (by C-G formula based on SCr of 0.69 mg/dL). Liver Function Tests: Recent Labs  Lab 03/19/18 1230 03/20/18 0504  AST 12* 9*  ALT 14 11*  ALKPHOS 79 61  BILITOT 0.7 0.6  PROT 7.0 6.2*  ALBUMIN 3.7 3.1*   Recent Labs  Lab 03/19/18 1230  LIPASE 16   No results for input(s): AMMONIA in the last 168 hours. Coagulation Profile: No results for input(s): INR, PROTIME in the last 168 hours. Cardiac Enzymes: No results for input(s): CKTOTAL, CKMB, CKMBINDEX, TROPONINI in the last 168 hours. BNP (last 3 results) No results for input(s): PROBNP in the last 8760 hours. HbA1C: No results for input(s): HGBA1C in the last 72 hours. CBG: No results for input(s): GLUCAP in the last 168 hours. Lipid Profile: No results for input(s): CHOL, HDL,  LDLCALC, TRIG, CHOLHDL, LDLDIRECT in the last 72 hours. Thyroid Function Tests: No results for input(s): TSH, T4TOTAL, FREET4, T3FREE, THYROIDAB in the last 72 hours. Anemia Panel: No results for input(s): VITAMINB12, FOLATE, FERRITIN, TIBC, IRON, RETICCTPCT in the last 72 hours. Urine analysis:    Component Value Date/Time   COLORURINE RED (A) 03/19/2018 1611   APPEARANCEUR TURBID (A) 03/19/2018 1611   LABSPEC 1.026 03/19/2018 1611   PHURINE 5.0 03/19/2018 1611   GLUCOSEU NEGATIVE 03/19/2018 1611   HGBUR MODERATE (A) 03/19/2018 1611   BILIRUBINUR NEGATIVE 03/19/2018 1611   KETONESUR 5 (A) 03/19/2018 1611   PROTEINUR 30 (A) 03/19/2018 1611   UROBILINOGEN 0.2 08/25/2015 1220   NITRITE POSITIVE (A) 03/19/2018 1611   LEUKOCYTESUR NEGATIVE 03/19/2018 1611   Sepsis Labs: @LABRCNTIP (procalcitonin:4,lacticidven:4)  )No results found for this or any previous visit (from the past 240 hour(s)).    Studies: Ct Abdomen Pelvis W Contrast  Result Date: 03/19/2018 CLINICAL DATA:  Nausea abdominal pain EXAM: CT ABDOMEN AND PELVIS WITH CONTRAST TECHNIQUE: Multidetector CT  imaging of the abdomen and pelvis was performed using the standard protocol following bolus administration of intravenous contrast. CONTRAST:  127mL ISOVUE-300 IOPAMIDOL (ISOVUE-300) INJECTION 61% COMPARISON:  CT 02/03/2018, 02/01/2018, 03/08/2016 FINDINGS: Lower chest: Lung bases demonstrate hazy atelectasis. No pleural effusion or focal consolidation. Heart size upper normal. Hepatobiliary: No focal liver abnormality is seen. No gallstones, gallbladder wall thickening, or biliary dilatation. Pancreas: Unremarkable. No pancreatic ductal dilatation or surrounding inflammatory changes. Spleen: Normal in size without focal abnormality. Adrenals/Urinary Tract: Adrenal glands are within normal limits. Cyst in the midpole of the right kidney. Bladder unremarkable Stomach/Bowel: Stomach is nonenlarged. Fluid filled, slightly dilated central  small bowel, measuring up to 3.3 cm with decompressed distal small bowel. Possible areas of colon wall thickening involving the ascending colon and hepatic flexure. Appendix upper normal in size with intraluminal radiopaque material but similar morphology compared to prior CT. Vascular/Lymphatic: Nonaneurysmal aorta. Stable subcentimeter retroperitoneal lymph nodes. Reproductive: Uterus unremarkable. 3 cm probable cyst in the right adnexa. Other: Negative for free air. Loculated ascites in the pelvis with less pronounced rim enhancement. Musculoskeletal: No acute or suspicious abnormality. IMPRESSION: 1. Slightly enlarged fluid-filled central small bowel loops with decompressed distal small bowel, may reflect developing or partial small bowel obstruction. 2. Possible mild colitis type changes involving the right colon and proximal transverse colon 3. Slight increased loculated appearing ascites within the pelvis and right gutter, but with decreased peripheral/rim enhancement compared to prior CT. Electronically Signed   By: Donavan Foil M.D.   On: 03/19/2018 17:51    Scheduled Meds: . bisacodyl  10 mg Rectal Daily  . lubiprostone  24 mcg Oral BID WC  . polyethylene glycol  17 g Oral BID    Continuous Infusions: . cefTRIAXone (ROCEPHIN)  IV Stopped (03/19/18 2208)     LOS: 1 day     Kayleen Memos, MD [facility] Pager [phone]  If 7PM-7AM, please contact night-coverage [email or web address] Password [email or web address] 03/20/2018, 1:36 PM

## 2018-03-20 NOTE — Progress Notes (Signed)
Pt refused to take lovenox, educated about benefits and risks but stated she does not need it and will not take it. MD notified.

## 2018-03-20 NOTE — Consult Note (Signed)
Referring Provider:   Dr. Aileen Fass ([facility]) Primary Care Physician:  [facility] Primary Gastroenterologist:  Dr. Cristina Gong  Reason for Consultation: Abdominal distention and pain  HPI: [name] is a 42 y.o. female last seen in our office about 2 years ago, with a history of recurring abdominal pain which has required repeated hospitalizations.  The patient is on chronic narcotic therapy primarily for orthopedic pain including ankle and back pain, although she does indicate that she uses it as well for abdominal pain.  The patient indicates that she was in her usual state of health until 2 days ago, when she had the rather abrupt onset of abdominal distention and diffuse upper abdominal pain.  Prior to that, she was not having any ongoing abdominal pain, by her report.  She experienced nausea and, after being given some medicine for nausea, had one episode of vomiting.    The patient does tend to run chronically constipated and had gone for some period of time without a bowel movement.  She does indicate that this morning, she did have 2 small bowel movements, passing hard stool.  At no time has she experienced diarrhea, which is relevant because of the question of some possible mild colitis in the proximal colon on the admission CT scan.  The patient also states that she has lost a large amount of weight in the past 6 months.  However, review of her hospital flowsheets indicates that her weight has actually been quite stable over the past couple of years.   Past Medical History:  Diagnosis Date  . Anemia   . Cervical disc disease   . Fibroids   . Heart murmur   . Low back pain     Past Surgical History:  Procedure Laterality Date  . ANKLE SURGERY    . CESAREAN SECTION    . TUBAL LIGATION      Prior to Admission medications   Medication Sig Start Date End Date Taking? Authorizing Provider  Ibuprofen-diphenhydrAMINE Cit (ADVIL PM PO) Take 1 tablet by mouth once.    Yes [provider]  lubiprostone (AMITIZA) 24 MCG capsule Take 24 mcg by mouth 2 (two) times daily with a meal.   Yes [provider]  doxycycline (VIBRA-TABS) 100 MG tablet Take 100 mg by mouth 2 (two) times daily.    [provider]  ferrous sulfate 325 (65 FE) MG tablet Take 1 tablet (325 mg total) by mouth 2 (two) times daily with a meal. Patient not taking: Reported on 03/19/2018 02/06/18   Bonnielee Haff, MD  Heating Pads (HEATING PAD DRY HEAT) PADS Use as directed 02/06/18   Bonnielee Haff, MD    Current Facility-Administered Medications  Medication Dose Route Frequency Provider Last Rate Last Dose  . acetaminophen (TYLENOL) tablet 650 mg  650 mg Oral Q6H PRN Opyd, Ilene Qua, MD   650 mg at 03/20/18 0140   Or  . acetaminophen (TYLENOL) suppository 650 mg  650 mg Rectal Q6H PRN Opyd, Ilene Qua, MD      . bisacodyl (DULCOLAX) suppository 10 mg  10 mg Rectal Daily Opyd, Ilene Qua, MD   10 mg at 03/20/18 0954  . cefTRIAXone (ROCEPHIN) 1 g in sodium chloride 0.9 % 100 mL IVPB  1 g Intravenous Q24H Opyd, Ilene Qua, MD   Stopped at 03/19/18 2208  . enoxaparin (LOVENOX) injection 40 mg  40 mg Subcutaneous Q24H Hall, Carole N, DO      . ferrous sulfate tablet  325 mg  325 mg Oral Q breakfast Irene Pap N, DO   325 mg at 03/20/18 1434  . HYDROcodone-acetaminophen (NORCO/VICODIN) 5-325 MG per tablet 1-2 tablet  1-2 tablet Oral Q4H PRN Opyd, Ilene Qua, MD   2 tablet at 03/20/18 1342  . HYDROmorphone (DILAUDID) injection 0.5 mg  0.5 mg Intravenous Q2H PRN Irene Pap N, DO   0.5 mg at 03/20/18 1429  . lubiprostone (AMITIZA) capsule 24 mcg  24 mcg Oral BID WC Opyd, Ilene Qua, MD   24 mcg at 03/20/18 0850  . naloxone [facility]) injection 0.4 mg  0.4 mg Intravenous PRN Irene Pap N, DO      . ondansetron (ZOFRAN) tablet 4 mg  4 mg Oral Q6H PRN Opyd, Ilene Qua, MD       Or  . ondansetron (ZOFRAN) injection 4 mg  4 mg Intravenous Q6H PRN Opyd, Ilene Qua, MD      .  polyethylene glycol (MIRALAX / GLYCOLAX) packet 17 g  17 g Oral BID Opyd, Ilene Qua, MD   17 g at 03/20/18 1512  . sodium phosphate (FLEET) 7-19 GM/118ML enema 1 enema  1 enema Rectal Once PRN Opyd, Ilene Qua, MD        Allergies as of 03/19/2018  . (No Known Allergies)    Family History  Problem Relation Age of Onset  . Diabetes Maternal Grandmother   . Hypertension Maternal Aunt   . Bowel Disease Mother     Social History   Socioeconomic History  . Marital status: Divorced    Spouse name: Not on file  . Number of children: Not on file  . Years of education: Not on file  . Highest education level: Not on file  Occupational History  . Not on file  Social Needs  . Financial resource strain: Not on file  . Food insecurity:    Worry: Not on file    Inability: Not on file  . Transportation needs:    Medical: Not on file    Non-medical: Not on file  Tobacco Use  . Smoking status: Current Every Day Smoker    Packs/day: 0.50    Types: Cigarettes  . Smokeless tobacco: Never Used  Substance and Sexual Activity  . Alcohol use: No    Alcohol/week: 0.0 oz  . Drug use: No  . Sexual activity: Yes    Comment: last sex 06 Jan 2016  Lifestyle  . Physical activity:    Days per week: Not on file    Minutes per session: Not on file  . Stress: Not on file  Relationships  . Social connections:    Talks on phone: Not on file    Gets together: Not on file    Attends religious service: Not on file    Active member of club or organization: Not on file    Attends meetings of clubs or organizations: Not on file    Relationship status: Not on file  . Intimate partner violence:    Fear of current or ex partner: Not on file    Emotionally abused: Not on file    Physically abused: Not on file    Forced sexual activity: Not on file  Other Topics Concern  . Not on file  Social History Narrative  . Not on file    Review of Systems: Negative for fevers, chills, chest pain, shortness  of breath, skin rashes, neurologic symptoms.  Physical Exam: Vital signs in last 24 hours: Temp:  [97.8  F (36.6 C)-99 F (37.2 C)] 97.8 F (36.6 C) (04/09 1400) Pulse Rate:  [52-84] 67 (04/09 1400) Resp:  [14-18] 16 (04/09 1400) BP: (101-158)/(26-97) 112/66 (04/09 1400) SpO2:  [90 %-100 %] 100 % (04/09 1400) Last BM Date: 03/19/18 General:   Alert,  Well-developed, somewhat thin but well-nourished, pleasant and cooperative in NAD Head:  Normocephalic and atraumatic. Eyes:  Sclera clear, no icterus.   Conjunctiva pink. Mouth:   No ulcerations or lesions.  Oropharynx pink & moist. Neck:   No masses or thyromegaly. Lungs:  Clear throughout to auscultation.   No wheezes, crackles, or rhonchi. No evident respiratory distress. Heart:   Regular rate and rhythm; no murmurs, clicks, rubs,  or gallops. Abdomen:    The abdomen is quite distended, equal to about 5 or 6 months of pregnancy, and moderately tympanitic, with active gurgling bowel sounds but not frankly obstructive bowel sounds.  No bruits. The abdomen is somewhat firm, but not rigid, and without overt tenderness or any guarding.  Certainly, no peritoneal findings. Msk:   Symmetrical without gross deformities. Extremities:   Without clubbing, cyanosis, or edema. Neurologic:  Alert and coherent;  grossly normal neurologically. Skin:  Intact without significant lesions or rashes. Cervical Nodes:  No significant cervical adenopathy. Psych:   Alert and cooperative. Normal mood and affect.  Intake/Output from previous day: 04/08 0701 - 04/09 0700 In: 100 [IV Piggyback:100] Out: -  Intake/Output this shift: No intake/output data recorded.  Lab Results: Recent Labs    03/19/18 1230 03/20/18 0504  WBC 11.6* 8.7  HGB 11.9* 10.6*  HCT 37.4 33.3*  PLT 295 274   BMET Recent Labs    03/19/18 1230 03/20/18 0504  NA 135 136  K 3.6 3.5  CL 104 105  CO2 20* 22  GLUCOSE 111* 94  BUN 5* 8  CREATININE 0.63 0.69  CALCIUM 9.1 8.6*    LFT Recent Labs    03/20/18 0504  PROT 6.2*  ALBUMIN 3.1*  AST 9*  ALT 11*  ALKPHOS 61  BILITOT 0.6   PT/INR No results for input(s): LABPROT, INR in the last 72 hours.  Studies/Results: Ct Abdomen Pelvis W Contrast  Result Date: 03/19/2018 CLINICAL DATA:  Nausea abdominal pain EXAM: CT ABDOMEN AND PELVIS WITH CONTRAST TECHNIQUE: Multidetector CT imaging of the abdomen and pelvis was performed using the standard protocol following bolus administration of intravenous contrast. CONTRAST:  162mL ISOVUE-300 IOPAMIDOL (ISOVUE-300) INJECTION 61% COMPARISON:  CT 02/03/2018, 02/01/2018, 03/08/2016 FINDINGS: Lower chest: Lung bases demonstrate hazy atelectasis. No pleural effusion or focal consolidation. Heart size upper normal. Hepatobiliary: No focal liver abnormality is seen. No gallstones, gallbladder wall thickening, or biliary dilatation. Pancreas: Unremarkable. No pancreatic ductal dilatation or surrounding inflammatory changes. Spleen: Normal in size without focal abnormality. Adrenals/Urinary Tract: Adrenal glands are within normal limits. Cyst in the midpole of the right kidney. Bladder unremarkable Stomach/Bowel: Stomach is nonenlarged. Fluid filled, slightly dilated central small bowel, measuring up to 3.3 cm with decompressed distal small bowel. Possible areas of colon wall thickening involving the ascending colon and hepatic flexure. Appendix upper normal in size with intraluminal radiopaque material but similar morphology compared to prior CT. Vascular/Lymphatic: Nonaneurysmal aorta. Stable subcentimeter retroperitoneal lymph nodes. Reproductive: Uterus unremarkable. 3 cm probable cyst in the right adnexa. Other: Negative for free air. Loculated ascites in the pelvis with less pronounced rim enhancement. Musculoskeletal: No acute or suspicious abnormality. IMPRESSION: 1. Slightly enlarged fluid-filled central small bowel loops with decompressed distal small bowel, may reflect developing  or  partial small bowel obstruction. 2. Possible mild colitis type changes involving the right colon and proximal transverse colon 3. Slight increased loculated appearing ascites within the pelvis and right gutter, but with decreased peripheral/rim enhancement compared to prior CT. Electronically Signed   By: Donavan Foil M.D.   On: 03/19/2018 17:51    Impression: 1.  Acute abdominal distention which appears to be a mixture of small and large bowel gas but questionable early small bowel obstruction based on CT interpretation (films reviewed).  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 2.  Chronic pain, chronic narcotic therapy  3.  Questionable proximal colitis on admission CT scan, probably artifactual  Plan: I have discussed the case with the patient's attending physician.  For now, I would favor minimization of narcotic analgesics to the degree possible, which will be a difficult thing to achieve because this patient is quite clearly focused on medication, and in fact, called my office this afternoon asking that I increase her Dilaudid dose.  In the absence of vomiting, I think a liquid diet is reasonable.  I will request follow-up films tomorrow to check for evolution of her bowel distention.  I tend to agree with the surgeons that exploratory surgery is not warranted, but if the patient does evolve into a picture of an anatomic small bowel obstruction, consideration might  be given to diagnostic laparoscopy to check for adhesions.   LOS: 1 day   Cartez Mogle V  03/20/2018, 3:49 PM   Pager [phone] If no answer or after 5 PM call [phone]

## 2018-03-20 NOTE — Plan of Care (Signed)
  Problem: Clinical Measurements: Goal: Ability to maintain clinical measurements within normal limits will improve Outcome: Progressing   Problem: Activity: Goal: Risk for activity intolerance will decrease Outcome: Progressing   Problem: Nutrition: Goal: Adequate nutrition will be maintained Outcome: Progressing   Problem: Elimination: Goal: Will not experience complications related to bowel motility Outcome: Progressing   Problem: Pain Managment: Goal: General experience of comfort will improve Outcome: Progressing   Problem: Safety: Goal: Ability to remain free from injury will improve Outcome: Progressing   

## 2018-03-21 ENCOUNTER — Inpatient Hospital Stay (HOSPITAL_COMMUNITY): Payer: Medicaid Other

## 2018-03-21 LAB — CBC
HCT: 31.6 % — ABNORMAL LOW (ref 36.0–46.0)
Hemoglobin: 9.8 g/dL — ABNORMAL LOW (ref 12.0–15.0)
MCH: 27.5 pg (ref 26.0–34.0)
MCHC: 31 g/dL (ref 30.0–36.0)
MCV: 88.5 fL (ref 78.0–100.0)
Platelets: 256 10*3/uL (ref 150–400)
RBC: 3.57 MIL/uL — ABNORMAL LOW (ref 3.87–5.11)
RDW: 21.3 % — ABNORMAL HIGH (ref 11.5–15.5)
WBC: 6.2 10*3/uL (ref 4.0–10.5)

## 2018-03-21 LAB — COMPREHENSIVE METABOLIC PANEL
ALT: 10 U/L — ABNORMAL LOW (ref 14–54)
AST: 9 U/L — ABNORMAL LOW (ref 15–41)
Albumin: 3 g/dL — ABNORMAL LOW (ref 3.5–5.0)
Alkaline Phosphatase: 56 U/L (ref 38–126)
Anion gap: 10 (ref 5–15)
BUN: 7 mg/dL (ref 6–20)
CO2: 22 mmol/L (ref 22–32)
Calcium: 8.6 mg/dL — ABNORMAL LOW (ref 8.9–10.3)
Chloride: 105 mmol/L (ref 101–111)
Creatinine, Ser: 0.62 mg/dL (ref 0.44–1.00)
GFR calc Af Amer: >60 mL/min (ref >60)
GFR calc non Af Amer: >60 mL/min (ref >60)
Glucose, Bld: 91 mg/dL (ref 65–99)
Potassium: 3.8 mmol/L (ref 3.5–5.1)
Sodium: 137 mmol/L (ref 135–145)
Total Bilirubin: 0.3 mg/dL (ref 0.3–1.2)
Total Protein: 6.1 g/dL — ABNORMAL LOW (ref 6.5–8.1)

## 2018-03-21 MED ORDER — SORBITOL 70 % SOLN
960.0000 mL | TOPICAL_OIL | Freq: Once | ORAL | Status: AC
  Administered 2018-03-21: 960 mL via RECTAL
  Filled 2018-03-21: qty 473

## 2018-03-21 MED ORDER — SACCHAROMYCES BOULARDII 250 MG PO CAPS
250.0000 mg | ORAL_CAPSULE | Freq: Two times a day (BID) | ORAL | Status: DC
  Administered 2018-03-21 – 2018-03-25 (×8): 250 mg via ORAL
  Filled 2018-03-21 (×8): qty 1

## 2018-03-21 MED ORDER — LINACLOTIDE 145 MCG PO CAPS
145.0000 ug | ORAL_CAPSULE | Freq: Every day | ORAL | Status: DC
  Administered 2018-03-21 – 2018-03-22 (×2): 145 ug via ORAL
  Filled 2018-03-21 (×2): qty 1

## 2018-03-21 MED ORDER — SENNOSIDES-DOCUSATE SODIUM 8.6-50 MG PO TABS
1.0000 | ORAL_TABLET | Freq: Two times a day (BID) | ORAL | Status: DC
  Administered 2018-03-22 – 2018-03-24 (×5): 1 via ORAL
  Filled 2018-03-21 (×7): qty 1

## 2018-03-21 MED ORDER — RIFAXIMIN 200 MG PO TABS
200.0000 mg | ORAL_TABLET | Freq: Three times a day (TID) | ORAL | Status: DC
  Administered 2018-03-21 – 2018-03-25 (×12): 200 mg via ORAL
  Filled 2018-03-21 (×14): qty 1

## 2018-03-21 NOTE — Progress Notes (Signed)
Patient continues to ask frequently for IV and po pain medication.  Medication has been given as ordered.  Patient request for IV dilaudid dose to be increased.  Attempted to explain to the patient the doses and reasons for the medication being ordered with the doses.  Patient has ambulated frequently in the hallway.  "Passing flatus"  Abd appears distended.  Patient has been on her telephone most of the day

## 2018-03-21 NOTE — Progress Notes (Signed)
[facility]                                                                              Patient Demographics  [name], is a 42 y.o. female, DOB - 19-Jul-1976, DJM:[id number]  Admit date - 03/19/2018   Admitting Physician Vianne Bulls, MD  Outpatient Primary MD for the patient is [place]  Outpatient specialists:   LOS - 2  days   Medical records reviewed and are as summarized below:    Chief Complaint  Patient presents with  . Abdominal Pain       Brief summary   Patient is a 42 year old female with chronic pain, narcotic dependency, DOB attributed to fibroids, chronic iron deficiency anemia presented with abdominal distention and pain over the past few days.  She reported nausea without vomiting, last BM 6 days prior to admission despite using MiraLAX and amitiza at home.  Reported ongoing vaginal bleeding for which she follows with OB/GYN.  In ED CT of the abdomen and pelvis showed possible developing or partial SBO with mild colitis in the right and proximal transverse colon, persistent fluid in the pelvis.  EDP discussed with surgery who reviewed CT scans and felt likely opioid induced constipation and recommended aggressive bowel regimen and advised against any surgical intervention.  Patient was admitted for further workup   Assessment & Plan    Principal Problem:   Partial small bowel obstruction ([place]) -Secondary to chronic narcotic dependency - CT abdomen and pelvis suggested partial/early SBO with mild colitis -Patient was started on bowel regimen, IV fluid hydration, stated had a small hard BM yesterday -GI was consulted, seen by Dr. Cristina Gong, also felt partial SBO from opioid induced intestinal dysmotility versus adhesions -Placed on miralax BID, Senokot S BID, linzess.  Give smog enema x1 -Patient continues to demand for increased narcotics for abdominal pain, explained to the patient that it is counterproductive to increase narcotics  while treating for SBO likely caused by the narcotics.  Explained delayed intestinal mobility due to narcotics however patient was somewhat upset about not increasing narcotics at this time. -will also check TSH to rule out hypothyroidism, continue full liquid diet -Per GI, questionable proximal colitis on admission CT likely artifactual.  Currently no fevers or leukocytosis -Repeat abdominal x-ray showed persistent small bowel dilatation -Added rifaximin by GI  Active Problems: UTI -Urine culture was ordered 4/8, still not collected, patient is on IV Rocephin  Iron deficiency anemia secondary to DOB and fibroids, normocytic - hold off on iron supplementation due to severe constipation -H&H currently stable, monitor closely, baseline hemoglobin around 8-9   Chronic narcotic use with chronic pain syndrome -Patient reports that she follows pain clinic  Code Status: Full CODE STATUS DVT Prophylaxis: Lovenox Family Communication: Discussed in detail with the patient, all imaging results, lab    Disposition Plan: Once clinically improving  Time Spent in minutes 35 minutes  Procedures:  CT abdomen pelvis  Consultants:   General surgery GI  Antimicrobials:  IV Rocephin 4/8  Medications  Scheduled Meds: . bisacodyl  10 mg Rectal Daily  . enoxaparin (LOVENOX) injection  40 mg  Subcutaneous Q24H  . feeding supplement (ENSURE ENLIVE)  237 mL Oral BID BM  . ferrous sulfate  325 mg Oral Q breakfast  . linaclotide  145 mcg Oral QAC breakfast  . polyethylene glycol  17 g Oral BID  . rifaximin  200 mg Oral TID  . saccharomyces boulardii  250 mg Oral BID  . senna-docusate  1 tablet Oral BID   Continuous Infusions: . cefTRIAXone (ROCEPHIN)  IV Stopped (03/20/18 2114)   PRN Meds:.acetaminophen **OR** acetaminophen, HYDROcodone-acetaminophen, HYDROmorphone (DILAUDID) injection, naLOXone (NARCAN)  injection, ondansetron **OR** ondansetron (ZOFRAN) IV, sodium phosphate   Antibiotics     Anti-infectives (From admission, onward)   Start     Dose/Rate Route Frequency Ordered Stop   03/21/18 1600  rifaximin (XIFAXAN) tablet 200 mg     200 mg Oral 3 times daily 03/21/18 1327     03/19/18 2130  cefTRIAXone (ROCEPHIN) 1 g in sodium chloride 0.9 % 100 mL IVPB     1 g 200 mL/hr over 30 Minutes Intravenous Every 24 hours 03/19/18 2118          Subjective:   [name] was seen and examined today.  Continues to complain of abdominal pain 7/10, diffuse with distention, no nausea or vomiting.  No fevers or chills.  Upset about not receiving enough pain medications.  No acute events overnight.  Objective:   Vitals:   03/20/18 1038 03/20/18 1400 03/20/18 2124 03/21/18 0528  BP: (!) 101/57 112/66 114/66 110/60  Pulse: (!) 58 67 61 (!) 54  Resp: 14 16 13 15   Temp: 98.3 F (36.8 C) 97.8 F (36.6 C) 98.3 F (36.8 C) 98 F (36.7 C)  TempSrc: Oral Oral Oral Oral  SpO2: 90% 100% 96% 99%  Weight:      Height:        Intake/Output Summary (Last 24 hours) at 03/21/2018 1409 Last data filed at 03/20/2018 1700 Gross per 24 hour  Intake 240 ml  Output -  Net 240 ml     Wt Readings from Last 3 Encounters:  03/19/18 69.4 kg (153 lb)  02/01/18 67.4 kg (148 lb 8 oz)  02/29/16 70.8 kg (156 lb)     Exam  General: Alert and oriented x 3, NAD  Eyes:   HEENT:    Cardiovascular: S1 S2 auscultated, no rubs, murmurs or gallops. Regular rate and rhythm.  Respiratory: Clear to auscultation bilaterally, no wheezing, rales or rhonchi  Gastrointestinal: Soft, diffuse tenderness, distention, +BS  Ext: no pedal edema bilaterally  Neuro: AAOx3, Cr N's II- XII. Strength 5/5 upper and lower extremities bilaterally  Musculoskeletal: No digital cyanosis, clubbing  Skin: No rashes  Psych: Normal affect and demeanor, alert and oriented x3    Data Reviewed:  I have personally reviewed following labs and imaging studies  Micro Results No results found for this or any  previous visit (from the past 240 hour(s)).  Radiology Reports Ct Abdomen Pelvis W Contrast  Result Date: 03/19/2018 CLINICAL DATA:  Nausea abdominal pain EXAM: CT ABDOMEN AND PELVIS WITH CONTRAST TECHNIQUE: Multidetector CT imaging of the abdomen and pelvis was performed using the standard protocol following bolus administration of intravenous contrast. CONTRAST:  155mL ISOVUE-300 IOPAMIDOL (ISOVUE-300) INJECTION 61% COMPARISON:  CT 02/03/2018, 02/01/2018, 03/08/2016 FINDINGS: Lower chest: Lung bases demonstrate hazy atelectasis. No pleural effusion or focal consolidation. Heart size upper normal. Hepatobiliary: No focal liver abnormality is seen. No gallstones, gallbladder wall thickening, or biliary dilatation. Pancreas: Unremarkable. No pancreatic ductal dilatation or surrounding inflammatory  changes. Spleen: Normal in size without focal abnormality. Adrenals/Urinary Tract: Adrenal glands are within normal limits. Cyst in the midpole of the right kidney. Bladder unremarkable Stomach/Bowel: Stomach is nonenlarged. Fluid filled, slightly dilated central small bowel, measuring up to 3.3 cm with decompressed distal small bowel. Possible areas of colon wall thickening involving the ascending colon and hepatic flexure. Appendix upper normal in size with intraluminal radiopaque material but similar morphology compared to prior CT. Vascular/Lymphatic: Nonaneurysmal aorta. Stable subcentimeter retroperitoneal lymph nodes. Reproductive: Uterus unremarkable. 3 cm probable cyst in the right adnexa. Other: Negative for free air. Loculated ascites in the pelvis with less pronounced rim enhancement. Musculoskeletal: No acute or suspicious abnormality. IMPRESSION: 1. Slightly enlarged fluid-filled central small bowel loops with decompressed distal small bowel, may reflect developing or partial small bowel obstruction. 2. Possible mild colitis type changes involving the right colon and proximal transverse colon 3. Slight  increased loculated appearing ascites within the pelvis and right gutter, but with decreased peripheral/rim enhancement compared to prior CT. Electronically Signed   By: Donavan Foil M.D.   On: 03/19/2018 17:51   Dg Abd 2 Views  Result Date: 03/21/2018 CLINICAL DATA:  Follow up small bowel obstruction EXAM: ABDOMEN - 2 VIEW COMPARISON:  03/19/2018 FINDINGS: Scattered large and small bowel gas is noted. Persistent small bowel dilatation is noted with air-fluid levels. No free air is noted. No acute bony abnormality is seen. IMPRESSION: Persistent small bowel dilatation similar to that seen on prior CT examination. Electronically Signed   By: Inez Catalina M.D.   On: 03/21/2018 09:33    Lab Data:  CBC: Recent Labs  Lab 03/19/18 1230 03/20/18 0504 03/21/18 0634  WBC 11.6* 8.7 6.2  NEUTROABS  --  5.7  --   HGB 11.9* 10.6* 9.8*  HCT 37.4 33.3* 31.6*  MCV 87.0 87.6 88.5  PLT 295 274 102   Basic Metabolic Panel: Recent Labs  Lab 03/19/18 1230 03/20/18 0504 03/21/18 0634  NA 135 136 137  K 3.6 3.5 3.8  CL 104 105 105  CO2 20* 22 22  GLUCOSE 111* 94 91  BUN 5* 8 7  CREATININE 0.63 0.69 0.62  CALCIUM 9.1 8.6* 8.6*  MG  --  2.0  --    GFR: Estimated Creatinine Clearance: 88.5 mL/min (by C-G formula based on SCr of 0.62 mg/dL). Liver Function Tests: Recent Labs  Lab 03/19/18 1230 03/20/18 0504 03/21/18 0634  AST 12* 9* 9*  ALT 14 11* 10*  ALKPHOS 79 61 56  BILITOT 0.7 0.6 0.3  PROT 7.0 6.2* 6.1*  ALBUMIN 3.7 3.1* 3.0*   Recent Labs  Lab 03/19/18 1230  LIPASE 16   No results for input(s): AMMONIA in the last 168 hours. Coagulation Profile: No results for input(s): INR, PROTIME in the last 168 hours. Cardiac Enzymes: No results for input(s): CKTOTAL, CKMB, CKMBINDEX, TROPONINI in the last 168 hours. BNP (last 3 results) No results for input(s): PROBNP in the last 8760 hours. HbA1C: No results for input(s): HGBA1C in the last 72 hours. CBG: No results for  input(s): GLUCAP in the last 168 hours. Lipid Profile: No results for input(s): CHOL, HDL, LDLCALC, TRIG, CHOLHDL, LDLDIRECT in the last 72 hours. Thyroid Function Tests: No results for input(s): TSH, T4TOTAL, FREET4, T3FREE, THYROIDAB in the last 72 hours. Anemia Panel: No results for input(s): VITAMINB12, FOLATE, FERRITIN, TIBC, IRON, RETICCTPCT in the last 72 hours. Urine analysis:    Component Value Date/Time   COLORURINE RED (A) 03/19/2018 1611  APPEARANCEUR TURBID (A) 03/19/2018 1611   LABSPEC 1.026 03/19/2018 1611   PHURINE 5.0 03/19/2018 1611   GLUCOSEU NEGATIVE 03/19/2018 1611   HGBUR MODERATE (A) 03/19/2018 1611   BILIRUBINUR NEGATIVE 03/19/2018 1611   KETONESUR 5 (A) 03/19/2018 1611   PROTEINUR 30 (A) 03/19/2018 1611   UROBILINOGEN 0.2 08/25/2015 1220   NITRITE POSITIVE (A) 03/19/2018 1611   LEUKOCYTESUR NEGATIVE 03/19/2018 1611     Ripudeep Rai M.D. [facility] 03/21/2018, 2:09 PM  Pager: [phone] Between 7am to 7pm - call Pager - [phone]  After 7pm go to [email or web address] - password [email or web address]  Call night coverage person covering after 7pm

## 2018-03-21 NOTE — Progress Notes (Signed)
Patient and family member (mother) continues to ask for pain medications to be increased claiming she's still in so much pain;spoke with doc-on-call and refused to increase dosages of pain medications based on a note by one of care team doctors stating "to minimize narcotics".Dilaudid IV PRN given twice as of this writing.Monitoring to continue.

## 2018-03-21 NOTE — Progress Notes (Signed)
Patient states she is able to eat and drink without difficulty, and indeed, she had finished her full liquid lunch when I came in.  She was sitting up on the side of the bed in no distress, but indicated that her abdominal pain is a "10."  I reviewed her abdominal films from this morning, which show a paucity of stool in the colon, and, as mentioned in the official interpretation, some scattered small and large bowel gas with an air-fluid level here or there was some dilated loops of small bowel.  At the time of my examination, mid day, the patient's abdomen was quite distended, about the same as yesterday, and the patient indicates that it tends to become more distended as the day goes on, and then go down some at night.  The patient has active bowel sounds and does not have overt abdominal tenderness.  It did appear that the patient's abdomen had more gas in it, by physical exam, then would have been expected from this morning's radiographs.  This suggests the possibility of possibly an element of postprandial gas production, for example, from small intestinal bacterial overgrowth.  Recommendations:  1.  I have added FloraStor probiotic supplementation to the patient's regimen since she is on IV ceftriaxone, as prophylaxis against C. difficile colitis. I do not think that this has to be continued after the patient goes off systemic antibiotics.  2.  I have also added rifaximin 200 mg p.o. 3 times daily to the patient's regimen, as an intraluminal antibiotic to potentially treat small intestinal bacterial overgrowth, if it is present.  This might result in less gas formation, less distention, and less discomfort.  This is a very expensive antibiotic, but if it is covered by insurance, I would ideally continue this regimen for somewhere between 10 days and 3 weeks.  3.  I continue to feel that this patient has a significant functional component to her pain symptoms, and that there is a relatively low  likelihood that an anatomic process, such as partial small bowel obstruction from adhesions, is playing a role.  Therefore, I would not be inclined to recommend exploratory laparoscopy at this point.  4.  To the degree possible, I would try to emphasize opioid reduction as I tend to think this would improve the patient's intestinal motility, and thereby reduce the degree of abdominal distention and discomfort.  Perhaps a K pad would reduce the patient's abdominal discomfort in the meantime.  [name], M.D. Pager [phone] If no answer or after 5 PM call [phone]

## 2018-03-22 ENCOUNTER — Inpatient Hospital Stay (HOSPITAL_COMMUNITY): Payer: Medicaid Other

## 2018-03-22 DIAGNOSIS — Z79891 Long term (current) use of opiate analgesic: Secondary | ICD-10-CM

## 2018-03-22 LAB — TSH: TSH: 2.641 u[IU]/mL (ref 0.350–4.500)

## 2018-03-22 MED ORDER — NALOXEGOL OXALATE 25 MG PO TABS
25.0000 mg | ORAL_TABLET | Freq: Every day | ORAL | Status: DC
  Administered 2018-03-22 – 2018-03-25 (×4): 25 mg via ORAL
  Filled 2018-03-22 (×4): qty 1

## 2018-03-22 MED ORDER — BISACODYL 10 MG RE SUPP: 10.0000 mg | Freq: Three times a day (TID) | RECTAL | Status: DC

## 2018-03-22 MED ORDER — SODIUM CHLORIDE 0.9 % IV SOLN
INTRAVENOUS | Status: DC
  Administered 2018-03-22: 17:00:00 via INTRAVENOUS

## 2018-03-22 MED ORDER — LINACLOTIDE 145 MCG PO CAPS
290.0000 ug | ORAL_CAPSULE | Freq: Every day | ORAL | Status: DC
  Administered 2018-03-23 – 2018-03-24 (×2): 290 ug via ORAL
  Filled 2018-03-22 (×3): qty 2

## 2018-03-22 MED ORDER — OXYCODONE-ACETAMINOPHEN 5-325 MG PO TABS
1.0000 | ORAL_TABLET | ORAL | Status: DC | PRN
  Administered 2018-03-22 – 2018-03-23 (×6): 2 via ORAL
  Filled 2018-03-22 (×7): qty 2

## 2018-03-22 MED ORDER — LACTULOSE 10 GM/15ML PO SOLN
30.0000 g | Freq: Two times a day (BID) | ORAL | Status: AC
  Administered 2018-03-22 (×2): 30 g via ORAL
  Filled 2018-03-22 (×2): qty 45

## 2018-03-22 MED ORDER — SORBITOL 70 % SOLN
960.0000 mL | TOPICAL_OIL | Freq: Once | ORAL | Status: AC
  Administered 2018-03-22: 960 mL via RECTAL
  Filled 2018-03-22: qty 473

## 2018-03-22 MED ORDER — HYDROCORTISONE 1 % EX CREA
TOPICAL_CREAM | CUTANEOUS | Status: DC | PRN
  Filled 2018-03-22 (×2): qty 28

## 2018-03-22 MED ORDER — LIDOCAINE HCL 2 % EX GEL
1.0000 "application " | Freq: Once | CUTANEOUS | Status: DC
  Filled 2018-03-22: qty 5

## 2018-03-22 MED ORDER — DIATRIZOATE MEGLUMINE & SODIUM 66-10 % PO SOLN
90.0000 mL | Freq: Once | ORAL | Status: AC
  Administered 2018-03-22: 90 mL via NASOGASTRIC
  Filled 2018-03-22: qty 90

## 2018-03-22 MED ORDER — BISACODYL 10 MG RE SUPP
10.0000 mg | Freq: Three times a day (TID) | RECTAL | Status: AC
  Administered 2018-03-22 (×2): 10 mg via RECTAL
  Filled 2018-03-22 (×3): qty 1

## 2018-03-22 NOTE — Progress Notes (Signed)
Patient had a really large loose bowel movement. I called Dr. Grandville Silos to see if he wanted to wait on the NG Tube placed and just have her drink the Gastrografin orally and continue to follow the protocol with the xrays.

## 2018-03-22 NOTE — Progress Notes (Addendum)
Initial Nutrition Assessment  DOCUMENTATION CODES:   Not applicable  INTERVENTION:    Continue Ensure Enlive po BID, each supplement provides 350 kcal and 20 grams of protein  NUTRITION DIAGNOSIS:   Increased nutrient needs related to acute illness as evidenced by estimated needs  GOAL:   Patient will meet greater than or equal to 90% of their needs  MONITOR:   PO intake, Supplement acceptance, Labs, Skin, Weight trends, I & O's  REASON FOR ASSESSMENT:   Malnutrition Screening Tool  ASSESSMENT:   42 yo Female with chronic pain, narcotic dependency, DOB attributed to fibroids, chronic iron deficiency anemia presented with abdominal distention and pain over the past few days.  She reported nausea without vomiting, last BM 6 days prior to admission despite using MiraLAX and amitiza at home.   In ED CT of abdomen/pelvis showed possible developing or partial SBO with mild colitis in the right and proximal transverse colon, persistent fluid in the pelvis.  RD spoke with pt at bedside. She has a flat affect. She does report a poor appetite. Currently on Full Liquids. PO intake 100% per flowsheet records.  States PTA she was consuming 1 meal per day with snacks. Has Ensure Enlive nutrition supplements ordered BID. Does like. Reveals a 27 lb weight loss (16%). Time frame unknown.  Noted NGT placement for decompression recommended. Pt refusing. Medications include Florastor and Miralax. Labs reviewed.  NUTRITION - FOCUSED PHYSICAL EXAM:  Completed. No muscle or fat depletion noticed.  Diet Order:  Diet NPO time specified Except for: Ice Chips, Sips with Meds  EDUCATION NEEDS:   No education needs have been identified at this time  Skin:  Skin Assessment: Reviewed RN Assessment  Last BM:  4/10   Intake/Output Summary (Last 24 hours) at 03/22/2018 1643 Last data filed at 03/22/2018 1500 Gross per 24 hour  Intake 1080 ml  Output -  Net 1080 ml   Height:   Ht  Readings from Last 1 Encounters:  03/19/18 5\' 4"  (1.626 m)   Weight:   Wt Readings from Last 1 Encounters:  03/19/18 153 lb (69.4 kg)   Wt Readings from Last 10 Encounters:  03/19/18 153 lb (69.4 kg)  02/01/18 148 lb 8 oz (67.4 kg)  02/29/16 156 lb (70.8 kg)  02/17/16 148 lb 9.6 oz (67.4 kg)  02/08/16 150 lb 1.6 oz (68.1 kg)  01/29/16 166 lb 14.4 oz (75.7 kg)  01/09/16 164 lb (74.4 kg)  01/06/16 165 lb (74.8 kg)  12/24/15 165 lb (74.8 kg)  10/06/14 176 lb (79.8 kg)   Ideal Body Weight:  54.5 kg  BMI:  Body mass index is 26.26 kg/m.  Estimated Nutritional Needs:   Kcal:  1850-2050  Protein:  90-110 gm  Fluid:  1.8-2.0 L  Arthur Holms, RD, LDN Pager #: [phone] After-Hours Pager #: [phone]

## 2018-03-22 NOTE — Consult Note (Signed)
Reason for Consult:PSBO Referring Physician: Tyler Compton  [name] is an 42 y.o. female.  HPI: Dr. Tana Compton asked me to see [name] in consultation regarding a partial small bowel obstruction.  She was admitted with this on 03/19/18.  CT scan of the abdomen and pelvis at that time demonstrated partial small bowel obstruction.  There was some possible mild colitis seen in the right colon.  She was placed on IV fluids and bowel rest.  She has had some small bowel movements but she remains distended.  She has intermittent crampy pain and is also a chronic pain patient.  Nothing seems to make the pain better or worse.  She is passing a little bit of gas.  She notes she had a very small bowel movement earlier today and just now.  She has a history of previous small bowel obstructions that resolved.  Past surgical history includes multiple cesarean sections.  Her mother is a patient of mine.  Past Medical History:  Diagnosis Date  . Anemia   . Cervical disc disease   . Fibroids   . Heart murmur   . Low back pain   . Partial small bowel obstruction ([place]) 03/2018    Past Surgical History:  Procedure Laterality Date  . ANKLE SURGERY    . CESAREAN SECTION    . TUBAL LIGATION      Family History  Problem Relation Age of Onset  . Diabetes Maternal Grandmother   . Hypertension Maternal Aunt   . Bowel Disease Mother     Social History:  reports that she has been smoking cigarettes.  She has a 8.00 pack-year smoking history. She has never used smokeless tobacco. She reports that she does not drink alcohol or use drugs.  Allergies: No Known Allergies  Medications: I have reviewed the patient's current medications.  Results for orders placed or performed during the hospital encounter of 03/19/18 (from the past 48 hour(s))  CBC     Status: Abnormal   Collection Time: 03/21/18  6:34 AM  Result Value Ref Range   WBC 6.2 4.0 - 10.5 K/uL   RBC 3.57 (L) 3.87 - 5.11 MIL/uL   Hemoglobin 9.8 (L) 12.0 -  15.0 g/dL   HCT 31.6 (L) 36.0 - 46.0 %   MCV 88.5 78.0 - 100.0 fL   MCH 27.5 26.0 - 34.0 pg   MCHC 31.0 30.0 - 36.0 g/dL   RDW 21.3 (H) 11.5 - 15.5 %   Platelets 256 150 - 400 K/uL    Comment: Performed at [facility], [place] [address], [place], [place] [address]  Comprehensive metabolic panel     Status: Abnormal   Collection Time: 03/21/18  6:34 AM  Result Value Ref Range   Sodium 137 135 - 145 mmol/L   Potassium 3.8 3.5 - 5.1 mmol/L   Chloride 105 101 - 111 mmol/L   CO2 22 22 - 32 mmol/L   Glucose, Bld 91 65 - 99 mg/dL   BUN 7 6 - 20 mg/dL   Creatinine, Ser 0.62 0.44 - 1.00 mg/dL   Calcium 8.6 (L) 8.9 - 10.3 mg/dL   Total Protein 6.1 (L) 6.5 - 8.1 g/dL   Albumin 3.0 (L) 3.5 - 5.0 g/dL   AST 9 (L) 15 - 41 U/L   ALT 10 (L) 14 - 54 U/L   Alkaline Phosphatase 56 38 - 126 U/L   Total Bilirubin 0.3 0.3 - 1.2 mg/dL   GFR calc non Af  Amer >60 >60 mL/min   GFR calc Af Amer >60 >60 mL/min    Comment: (NOTE) The eGFR has been calculated using the CKD EPI equation. This calculation has not been validated in all clinical situations. eGFR's persistently <60 mL/min signify possible Chronic Kidney Disease.    Anion gap 10 5 - 15    Comment: Performed at [facility] [address] [place], [place] [address]  TSH     Status: None   Collection Time: 03/22/18  3:25 AM  Result Value Ref Range   TSH 2.641 0.350 - 4.500 uIU/mL    Comment: Performed by a 3rd Generation assay with a functional sensitivity of <=0.01 uIU/mL. Performed at [facility], [place] [address] [place], [place] [address]     Dg Abd 2 Views  Result Date: 03/21/2018 CLINICAL DATA:  Follow up small bowel obstruction EXAM: ABDOMEN - 2 VIEW COMPARISON:  03/19/2018 FINDINGS: Scattered large and small bowel gas is noted. Persistent small bowel dilatation is noted with air-fluid levels. No free air is noted. No acute bony abnormality is seen. IMPRESSION: Persistent small bowel dilatation similar to that seen  on prior CT examination. Electronically Signed   By: [name] M.D.   On: 03/21/2018 09:33   Dg Abd Portable 1v  Result Date: 03/22/2018 CLINICAL DATA:  Small bowel obstruction. EXAM: PORTABLE ABDOMEN - 1 VIEW COMPARISON:  Radiographs yesterday.  CT 03/19/2018 FINDINGS: Similar bowel gas pattern to prior exam with prominent air-filled bowel in the central and upper abdomen, likely dilated small bowel. No evidence of free air. Pelvic phleboliths. IMPRESSION: Unchanged bowel gas pattern. Persistent gaseous distention of bowel loops in the central and upper abdomen consistent with persistent small bowel obstruction. Electronically Signed   By: [name] M.D.   On: 03/22/2018 00:56    Review of Systems  Constitutional: Negative for chills and fever.  HENT: Negative for hearing loss.   Eyes: Negative for blurred vision and double vision.  Respiratory: Negative for cough and shortness of breath.   Cardiovascular: Negative for chest pain.  Gastrointestinal: Positive for abdominal pain, constipation and nausea. Negative for vomiting.  Genitourinary: Negative.   Musculoskeletal: Negative.   Skin: Negative.   Neurological: Negative.   Endo/Heme/Allergies: Negative.   Psychiatric/Behavioral:       Chronic pain   Blood pressure 116/75, pulse 60, temperature 98.4 F (36.9 C), temperature source Oral, resp. rate 16, height 5' 4"  (1.626 m), weight 69.4 kg (153 lb), last menstrual period 02/28/2018, SpO2 100 %. Physical Exam  Constitutional: She is oriented to person, place, and time. She appears well-developed and well-nourished. No distress.  HENT:  Head: Normocephalic.  Right Ear: External ear normal.  Left Ear: External ear normal.  Mouth/Throat: Oropharynx is clear and moist.  Eyes: Pupils are equal, round, and reactive to light.  Neck: Neck supple.  Cardiovascular: Normal rate and regular rhythm.  Respiratory: Effort normal and breath sounds normal. No respiratory distress. She has  no wheezes.  GI: Soft. She exhibits distension. There is no tenderness. There is no rebound and no guarding.  Bowel sounds are hypoactive  Musculoskeletal: Normal range of motion.  Neurological: She is alert and oriented to person, place, and time.  Psychiatric: She has a normal mood and affect.    Assessment/Plan: Partial small bowel obstruction, possibly related to adhesions.  Her chronic narcotic use complicates the picture.  No need for emergent exploration.  I recommend proceeding with small bowel obstruction protocol.  She  has been reluctant to have an NG tube placed but says she will reconsider and let her nurse know soon.  We will continue to follow and I spoke with her and her mother regarding the plan of care.  If her obstruction does not open up, she may require surgery.  Zenovia Jarred 03/22/2018, 5:42 PM

## 2018-03-22 NOTE — Progress Notes (Signed)
Responded to patient's room on 5N in reference to complaint from mother of patient. Mother expresses concerns that her daughters pain is not being managed well and that her abdomen is more swollen and tight. Mother explains that they had spoken with a physician who was on call for Dr Cristina Gong and were told to ask for the hospitalist who is covering to re-examine her daughter to determine if medication adjustments or tests were needed. Mother and daughter state that they were told that Dr. Myna Hidalgo could not come to the bedside at this time and advised that current orders are appropriate per Dr Buccini's documentation. Mother remains concerned that daughter may have a worsening problem, and requests that a provider re-evaluate her daughter. Acknowledged concerns and advised patient and family that I would contact physician personally.   D.Kadeja Granada, RN, Administrator, arts, [facility]

## 2018-03-22 NOTE — Progress Notes (Addendum)
[facility]                                                                              Patient Demographics  [name], is a 42 y.o. female, DOB - 1976-04-18, WGN:[id number]  Admit date - 03/19/2018   Admitting Physician Vianne Bulls, MD  Outpatient Primary MD for the patient is [place]  Outpatient specialists:   LOS - 3  days   Medical records reviewed and are as summarized below:    Chief Complaint  Patient presents with  . Abdominal Pain       Brief summary   Patient is a 42 year old female with chronic pain, narcotic dependency, DOB attributed to fibroids, chronic iron deficiency anemia presented with abdominal distention and pain over the past few days.  She reported nausea without vomiting, last BM 6 days prior to admission despite using MiraLAX and amitiza at home.  Reported ongoing vaginal bleeding for which she follows with OB/GYN.  In ED CT of the abdomen and pelvis showed possible developing or partial SBO with mild colitis in the right and proximal transverse colon, persistent fluid in the pelvis.  EDP discussed with surgery who reviewed CT scans and felt likely opioid induced constipation and recommended aggressive bowel regimen and advised against any surgical intervention.  Patient was admitted for further workup   Assessment & Plan    Principal Problem:   Partial small bowel obstruction ([place]) -Secondary to chronic narcotic dependency - CT abdomen and pelvis suggested partial/early SBO with mild colitis -Patient was started on bowel regimen, IV fluid hydration, stated had a small hard BM yesterday -GI was consulted, seen by Dr. Cristina Gong, also felt partial SBO from opioid induced intestinal dysmotility versus low probability of adhesions causing SBO. Per GI, questionable proximal colitis on admission CT likely artifactual.  Currently no fevers or leukocytosis -Overnight issues reviewed, discussed in detail with the patient, again  requested increase narcotic dose.  I reiterated to the patient as explained by multiple physicians and NP at night that narcotics will only worsen the gut motility, distention and pain. -Ordered lactulose, smog enema, continue linzess, MiraLAX twice a day. Had a small BM this afternoon, if no significant improvement or BM with enema, will try 1 dose of relistor in the evening.  -Repeat x-ray earlier this morning showed persistent dilatation, SBO. Patient is agreeable to NG if no BM by the end of the day. Addendum:3:30PM  Small BM per RN despite multiple stool softeners, laxatives and enema.  - Will attempt NG to intermittent suction if patient agrees Education officer, museum daily 25mg , NPO, gentle IVF - abd Xray in am  Addendum: 5:05pm Called by RN, patient's mother at the bedside wants to talk to MD regarding the plan of care.  Explained to the patient's mother regarding the rationale behind not increasing narcotics as it will worsen the ileus/SBO by delaying the gut motility.  Patient has a history of recurrent SBO with previous surgeries.  Explained to the patient's mother that we have to address her SBO conservatively before exploratory laparotomy to open up which will be surgery's discretion. -Patient now agrees for  NG tube, will place NGT to intermittent wall suction, n.p.o., IV fluids -Also discussed with Dr. Jacinto Reap. [place] Silos for surgery consult, discussed with Dr. Cristina Gong per patient's mother's request.    Active Problems: UTI -Urine culture was ordered 4/8, still not collected, patient is on IV Rocephin  Iron deficiency anemia secondary to DOB and fibroids, normocytic - hold off on iron supplementation due to severe constipation -H&H currently stable, monitor closely, baseline hemoglobin around 8-9  Chronic narcotic use with chronic pain syndrome -Patient reports that she follows pain clinic  Code Status: Full CODE STATUS DVT Prophylaxis: Lovenox Family Communication: Discussed in detail with  the patient, all imaging results, lab explained to the patient and her significant other in the room.   Disposition Plan: Once clinically improving  Time Spent in minutes 25 minutes.  5:15 pm; Repeat 45 mins spent with patient, mother and family member at the bedside, examining, coordinating care with specialists, explaining the plan of management.  Procedures:  CT abdomen pelvis  Consultants:   General surgery GI  Antimicrobials:  IV Rocephin 4/8  Medications  Scheduled Meds: . bisacodyl  10 mg Rectal Daily  . enoxaparin (LOVENOX) injection  40 mg Subcutaneous Q24H  . feeding supplement (ENSURE ENLIVE)  237 mL Oral BID BM  . lactulose  30 g Oral BID  . linaclotide  145 mcg Oral QAC breakfast  . polyethylene glycol  17 g Oral BID  . rifaximin  200 mg Oral TID  . saccharomyces boulardii  250 mg Oral BID  . senna-docusate  1 tablet Oral BID   Continuous Infusions: . cefTRIAXone (ROCEPHIN)  IV Stopped (03/21/18 2227)   PRN Meds:.acetaminophen **OR** acetaminophen, HYDROmorphone (DILAUDID) injection, naLOXone (NARCAN)  injection, ondansetron **OR** ondansetron (ZOFRAN) IV, oxyCODONE-acetaminophen, sodium phosphate   Antibiotics   Anti-infectives (From admission, onward)   Start     Dose/Rate Route Frequency Ordered Stop   03/21/18 1600  rifaximin (XIFAXAN) tablet 200 mg     200 mg Oral 3 times daily 03/21/18 1327     03/19/18 2130  cefTRIAXone (ROCEPHIN) 1 g in sodium chloride 0.9 % 100 mL IVPB     1 g 200 mL/hr over 30 Minutes Intravenous Every 24 hours 03/19/18 2118          Subjective:   [name] was seen and examined today.  Asking for pain medication, abdomen still distended, no vomiting.  No fevers or chills.  States she has abdominal pain.  No chest pain, shortness of breath, dizziness, no new weakness.  Objective:   Vitals:   03/21/18 0528 03/21/18 1700 03/21/18 2118 03/22/18 0609  BP: 110/60 (!) 144/77 (!) 149/86 120/74  Pulse: (!) 54 62 66 61    Resp: 15 16    Temp: 98 F (36.7 C) 98 F (36.7 C) 98 F (36.7 C) 98.9 F (37.2 C)  TempSrc: Oral Oral Oral Oral  SpO2: 99% 99% 100% 100%  Weight:      Height:        Intake/Output Summary (Last 24 hours) at 03/22/2018 1306 Last data filed at 03/22/2018 0900 Gross per 24 hour  Intake 600 ml  Output -  Net 600 ml     Wt Readings from Last 3 Encounters:  03/19/18 69.4 kg (153 lb)  02/01/18 67.4 kg (148 lb 8 oz)  02/29/16 70.8 kg (156 lb)     Exam   General: Alert and oriented x 3, NAD  Eyes: ,  HEENT:  Atraumatic, normocephalic, normal oropharynx  Cardiovascular:  S1 S2 auscultated, regular rate and rhythm. No pedal edema b/l  Respiratory: Clear to auscultation bilaterally, no wheezing, rales or rhonchi  Gastrointestinal: Soft, mild diffuse tenderness with distention. + bowel sounds  Ext: no pedal edema bilaterally  Neuro: no new deficits  Musculoskeletal: No digital cyanosis, clubbing  Skin: No rashes  Psych: Normal affect and demeanor, alert and oriented x3    Data Reviewed:  I have personally reviewed following labs and imaging studies  Micro Results No results found for this or any previous visit (from the past 240 hour(s)).  Radiology Reports Ct Abdomen Pelvis W Contrast  Result Date: 03/19/2018 CLINICAL DATA:  Nausea abdominal pain EXAM: CT ABDOMEN AND PELVIS WITH CONTRAST TECHNIQUE: Multidetector CT imaging of the abdomen and pelvis was performed using the standard protocol following bolus administration of intravenous contrast. CONTRAST:  136mL ISOVUE-300 IOPAMIDOL (ISOVUE-300) INJECTION 61% COMPARISON:  CT 02/03/2018, 02/01/2018, 03/08/2016 FINDINGS: Lower chest: Lung bases demonstrate hazy atelectasis. No pleural effusion or focal consolidation. Heart size upper normal. Hepatobiliary: No focal liver abnormality is seen. No gallstones, gallbladder wall thickening, or biliary dilatation. Pancreas: Unremarkable. No pancreatic ductal dilatation or  surrounding inflammatory changes. Spleen: Normal in size without focal abnormality. Adrenals/Urinary Tract: Adrenal glands are within normal limits. Cyst in the midpole of the right kidney. Bladder unremarkable Stomach/Bowel: Stomach is nonenlarged. Fluid filled, slightly dilated central small bowel, measuring up to 3.3 cm with decompressed distal small bowel. Possible areas of colon wall thickening involving the ascending colon and hepatic flexure. Appendix upper normal in size with intraluminal radiopaque material but similar morphology compared to prior CT. Vascular/Lymphatic: Nonaneurysmal aorta. Stable subcentimeter retroperitoneal lymph nodes. Reproductive: Uterus unremarkable. 3 cm probable cyst in the right adnexa. Other: Negative for free air. Loculated ascites in the pelvis with less pronounced rim enhancement. Musculoskeletal: No acute or suspicious abnormality. IMPRESSION: 1. Slightly enlarged fluid-filled central small bowel loops with decompressed distal small bowel, may reflect developing or partial small bowel obstruction. 2. Possible mild colitis type changes involving the right colon and proximal transverse colon 3. Slight increased loculated appearing ascites within the pelvis and right gutter, but with decreased peripheral/rim enhancement compared to prior CT. Electronically Signed   By: Donavan Foil M.D.   On: 03/19/2018 17:51   Dg Abd 2 Views  Result Date: 03/21/2018 CLINICAL DATA:  Follow up small bowel obstruction EXAM: ABDOMEN - 2 VIEW COMPARISON:  03/19/2018 FINDINGS: Scattered large and small bowel gas is noted. Persistent small bowel dilatation is noted with air-fluid levels. No free air is noted. No acute bony abnormality is seen. IMPRESSION: Persistent small bowel dilatation similar to that seen on prior CT examination. Electronically Signed   By: Inez Catalina M.D.   On: 03/21/2018 09:33   Dg Abd Portable 1v  Result Date: 03/22/2018 CLINICAL DATA:  Small bowel obstruction.  EXAM: PORTABLE ABDOMEN - 1 VIEW COMPARISON:  Radiographs yesterday.  CT 03/19/2018 FINDINGS: Similar bowel gas pattern to prior exam with prominent air-filled bowel in the central and upper abdomen, likely dilated small bowel. No evidence of free air. Pelvic phleboliths. IMPRESSION: Unchanged bowel gas pattern. Persistent gaseous distention of bowel loops in the central and upper abdomen consistent with persistent small bowel obstruction. Electronically Signed   By: Jeb Levering M.D.   On: 03/22/2018 00:56    Lab Data:  CBC: Recent Labs  Lab 03/19/18 1230 03/20/18 0504 03/21/18 0634  WBC 11.6* 8.7 6.2  NEUTROABS  --  5.7  --   HGB 11.9* 10.6* 9.8*  HCT 37.4 33.3* 31.6*  MCV 87.0 87.6 88.5  PLT 295 274 503   Basic Metabolic Panel: Recent Labs  Lab 03/19/18 1230 03/20/18 0504 03/21/18 0634  NA 135 136 137  K 3.6 3.5 3.8  CL 104 105 105  CO2 20* 22 22  GLUCOSE 111* 94 91  BUN 5* 8 7  CREATININE 0.63 0.69 0.62  CALCIUM 9.1 8.6* 8.6*  MG  --  2.0  --    GFR: Estimated Creatinine Clearance: 88.5 mL/min (by C-G formula based on SCr of 0.62 mg/dL). Liver Function Tests: Recent Labs  Lab 03/19/18 1230 03/20/18 0504 03/21/18 0634  AST 12* 9* 9*  ALT 14 11* 10*  ALKPHOS 79 61 56  BILITOT 0.7 0.6 0.3  PROT 7.0 6.2* 6.1*  ALBUMIN 3.7 3.1* 3.0*   Recent Labs  Lab 03/19/18 1230  LIPASE 16   No results for input(s): AMMONIA in the last 168 hours. Coagulation Profile: No results for input(s): INR, PROTIME in the last 168 hours. Cardiac Enzymes: No results for input(s): CKTOTAL, CKMB, CKMBINDEX, TROPONINI in the last 168 hours. BNP (last 3 results) No results for input(s): PROBNP in the last 8760 hours. HbA1C: No results for input(s): HGBA1C in the last 72 hours. CBG: No results for input(s): GLUCAP in the last 168 hours. Lipid Profile: No results for input(s): CHOL, HDL, LDLCALC, TRIG, CHOLHDL, LDLDIRECT in the last 72 hours. Thyroid Function Tests: Recent Labs      03/22/18 0325  TSH 2.641   Anemia Panel: No results for input(s): VITAMINB12, FOLATE, FERRITIN, TIBC, IRON, RETICCTPCT in the last 72 hours. Urine analysis:    Component Value Date/Time   COLORURINE RED (A) 03/19/2018 1611   APPEARANCEUR TURBID (A) 03/19/2018 1611   LABSPEC 1.026 03/19/2018 1611   PHURINE 5.0 03/19/2018 1611   GLUCOSEU NEGATIVE 03/19/2018 1611   HGBUR MODERATE (A) 03/19/2018 1611   BILIRUBINUR NEGATIVE 03/19/2018 1611   KETONESUR 5 (A) 03/19/2018 1611   PROTEINUR 30 (A) 03/19/2018 1611   UROBILINOGEN 0.2 08/25/2015 1220   NITRITE POSITIVE (A) 03/19/2018 1611   LEUKOCYTESUR NEGATIVE 03/19/2018 1611     Ripudeep Rai M.D. [facility] 03/22/2018, 1:06 PM  Pager: [phone] Between 7am to 7pm - call Pager - [phone]  After 7pm go to [email or web address] - password [email or web address]  Call night coverage person covering after 7pm

## 2018-03-22 NOTE — Progress Notes (Addendum)
NP was paged by Dr. Myna Hidalgo to discuss pain meds with pt. Attending had already explained the reason for no increase in pain meds today. Later, [name], called NP and wanted NP to see pt because pt's mother is concerned pt is getting worse because of the pain. NP to bedside.  S: Pain has been same all day up until about 3 hours ago when it got worse, but not by much. Rates pain 10/10, "burning". + flatus. No n/v. Pain is "all over" abdomen.  O: Fairly well appearing young AAF in mild distress. She keeps her eyes closed during exam. Abd distended. BS + x 4 quads. Tender in all quadrants. No rebound or guarding. Sits up in bed to talk.  A/P: 1. Known SBP-no different on xray this am. R/P xray now. Discussed with pt that NP believes her pain would decrease if NGT was placed to suction to decompress belly. Pt stated "the xray won't be worse, I know my body and you don't". However, agrees to have xray done but refusing NGT stating "I might later, but not now". Again, NP explained that the best thing for her pain would be to decompress her abdomen and NP still recommends NGT.  Mother is present and concentrates on pt getting more pain medications but does tell pt the NGT would help.   Pt asking for more pain meds. Again, explained the mechanics of narcotics and that they can make the pain and distention worse by slowing the bowel. Not receptive to explanation.  Will get xray results and go from there.  KJKG, NP [facility]: Xray with continued SBO, no improvement. Still recommend NGT which pt refused to have placed.   Total critical care time: 45 minutes Critical care time was exclusive of separately billable procedures and treating other patients. Critical care was necessary to treat or prevent imminent or life-threatening deterioration. Critical care was time spent personally by me on the following activities: development of treatment plan with patient and/or surrogate as well as nursing, discussions with  consultants, evaluation of patient's response to treatment, examination of patient, obtaining history from patient or surrogate, ordering and performing treatments and interventions, ordering and review of laboratory studies, ordering and review of radiographic studies, pulse oximetry and re-evaluation of patient's condition.

## 2018-03-22 NOTE — Progress Notes (Addendum)
Pt seen per request of attending physician, at request of pt's mother, who is at bedside.  Question remains whether this is pseudo-obstruction perhaps exacerbated by her chronic opiate therapy, vs a mechanical obstruction, perhaps obscured by overlay of psych issues, including having a stated pain on level of 9 yet absolutely no visible distress, surfing on phone, sitting up on side of bed.   On exam, abd is currently only mildly distended following a large BM (both liq and solid stool, but not much gas, per pt); bowel sounds are less "turbulent" than on yesterday's exam.  Mild/mod tympany, soft, nontender.  Dr. Biagio Borg note reviewed; agree w/ SBO protocol.  Also discussed pt's case w/ Dr. Loni Muse w/ plan for minimization of opioid pain medicine.    As an afterthought, consideration might be given to use of IV Ativan as an alternative to opioids, if pt starts escalating in terms of anxiety/desparation for relief.  The Ativan would not have the anti-motility effects that opioids would.    Apparently mechanical measures, such as heating pad, have not been successful.    Risk of surgery causing adhesions reviewed w/ pt and mother, the latter of whom has herself required repeated operations for SBO/chronic abd pain related to adhesions.   Cleotis Nipper, M.D. Pager [phone] If no answer or after 5 PM call [phone]

## 2018-03-22 NOTE — Progress Notes (Addendum)
Subjective: The patient was seen and examined at bedside. She reports having a small bowel movements after suppository and another small bowel movement today which she describes as brown and hard in consistency. She continues to have abdominal distention and abdominal pain and is requesting for further increase in dosage and decreased interval of narcotics  Objective: Vital signs in last 24 hours: Temp:  [98 F (36.7 C)-98.9 F (37.2 C)] 98.9 F (37.2 C) (04/11 0609) Pulse Rate:  [61-66] 61 (04/11 0609) Resp:  [16] 16 (04/10 1700) BP: (120-149)/(74-86) 120/74 (04/11 0609) SpO2:  [99 %-100 %] 100 % (04/11 0609) Weight change:  Last BM Date: 03/21/18  TI:[id number] up comfortably on bed GENERAL:not in acute distress ABDOMEN:distended, mild generalized abdominal tenderness, sluggish and hypoactive bowel sounds EXTREMITIES:no edema, no deformity  Lab Results: Results for orders placed or performed during the hospital encounter of 03/19/18 (from the past 48 hour(s))  CBC     Status: Abnormal   Collection Time: 03/21/18  6:34 AM  Result Value Ref Range   WBC 6.2 4.0 - 10.5 K/uL   RBC 3.57 (L) 3.87 - 5.11 MIL/uL   Hemoglobin 9.8 (L) 12.0 - 15.0 g/dL   HCT 31.6 (L) 36.0 - 46.0 %   MCV 88.5 78.0 - 100.0 fL   MCH 27.5 26.0 - 34.0 pg   MCHC 31.0 30.0 - 36.0 g/dL   RDW 21.3 (H) 11.5 - 15.5 %   Platelets 256 150 - 400 K/uL    Comment: Performed at [facility], [address], [place], [place] [address]  Comprehensive metabolic panel     Status: Abnormal   Collection Time: 03/21/18  6:34 AM  Result Value Ref Range   Sodium 137 135 - 145 mmol/L   Potassium 3.8 3.5 - 5.1 mmol/L   Chloride 105 101 - 111 mmol/L   CO2 22 22 - 32 mmol/L   Glucose, Bld 91 65 - 99 mg/dL   BUN 7 6 - 20 mg/dL   Creatinine, Ser 0.62 0.44 - 1.00 mg/dL   Calcium 8.6 (L) 8.9 - 10.3 mg/dL   Total Protein 6.1 (L) 6.5 - 8.1 g/dL   Albumin 3.0 (L) 3.5 - 5.0 g/dL   AST 9 (L) 15 - 41 U/L   ALT 10 (L) 14 - 54 U/L    Alkaline Phosphatase 56 38 - 126 U/L   Total Bilirubin 0.3 0.3 - 1.2 mg/dL   GFR calc non Af Amer >60 >60 mL/min   GFR calc Af Amer >60 >60 mL/min    Comment: (NOTE) The eGFR has been calculated using the CKD EPI equation. This calculation has not been validated in all clinical situations. eGFR's persistently <60 mL/min signify possible Chronic Kidney Disease.    Anion gap 10 5 - 15    Comment: Performed at [facility] [address], [place], [place] [address]  TSH     Status: None   Collection Time: 03/22/18  3:25 AM  Result Value Ref Range   TSH 2.641 0.350 - 4.500 uIU/mL    Comment: Performed by a 3rd Generation assay with a functional sensitivity of <=0.01 uIU/mL. Performed at [facility], [place] [address], [place],  [address]     Studies/Results: Dg Abd 2 Views  Result Date: 03/21/2018 CLINICAL DATA:  Follow up small bowel obstruction EXAM: ABDOMEN - 2 VIEW COMPARISON:  03/19/2018 FINDINGS: Scattered large and small bowel gas is noted. Persistent small bowel dilatation is noted with air-fluid  levels. No free air is noted. No acute bony abnormality is seen. IMPRESSION: Persistent small bowel dilatation similar to that seen on prior CT examination. Electronically Signed   By: Inez Catalina M.D.   On: 03/21/2018 09:33   Dg Abd Portable 1v  Result Date: 03/22/2018 CLINICAL DATA:  Small bowel obstruction. EXAM: PORTABLE ABDOMEN - 1 VIEW COMPARISON:  Radiographs yesterday.  CT 03/19/2018 FINDINGS: Similar bowel gas pattern to prior exam with prominent air-filled bowel in the central and upper abdomen, likely dilated small bowel. No evidence of free air. Pelvic phleboliths. IMPRESSION: Unchanged bowel gas pattern. Persistent gaseous distention of bowel loops in the central and upper abdomen consistent with persistent small bowel obstruction. Electronically Signed   By: Jeb Levering M.D.   On: 03/22/2018 00:56    Medications: I have reviewed the patient's  current medications.  Assessment: Persistent small bowel obstruction likely related to chronic narcotic dependency.  Currently on Dulcolax suppository once, lactulose 30 g by mouth twice a day for 2 doses, Linzess 145 micrograms daily, MiraLAX 17 g twice a day, senna 1 tablet twice a day, Fleet enema per rectum when necessary for severe constipation, sorbitol/milk of magnesia/mineral oil/disease from an enema on 03/21/18 and 03/22/18.  Plan: As per Dr. Rai-patient to receive 1 dose of Relistor/methylnaltrexone(450 mg PO or 12 mg Orcutt) this evening. Another option would be to start on Movantik 25 mg by mouth daily(should be avoided in patient with obstruction). Patient does not want to decrease narcotics, which will be an ongoing problem. Will increase Linzess to 290 g and give Dulcolax suppositories 3 times for today.   Ronnette Juniper 03/22/2018, 1:47 PM   Pager [phone] If no answer or after 5 PM call [phone]

## 2018-03-22 NOTE — Progress Notes (Signed)
Patient refused NG tube; doc-on-call aware

## 2018-03-23 ENCOUNTER — Inpatient Hospital Stay (HOSPITAL_COMMUNITY): Payer: Medicaid Other

## 2018-03-23 DIAGNOSIS — N390 Urinary tract infection, site not specified: Secondary | ICD-10-CM

## 2018-03-23 DIAGNOSIS — R109 Unspecified abdominal pain: Secondary | ICD-10-CM

## 2018-03-23 DIAGNOSIS — N938 Other specified abnormal uterine and vaginal bleeding: Secondary | ICD-10-CM

## 2018-03-23 DIAGNOSIS — K5903 Drug induced constipation: Secondary | ICD-10-CM

## 2018-03-23 DIAGNOSIS — Z79891 Long term (current) use of opiate analgesic: Secondary | ICD-10-CM

## 2018-03-23 DIAGNOSIS — K56600 Partial intestinal obstruction, unspecified as to cause: Secondary | ICD-10-CM

## 2018-03-23 DIAGNOSIS — T402X5A Adverse effect of other opioids, initial encounter: Secondary | ICD-10-CM

## 2018-03-23 LAB — BASIC METABOLIC PANEL
Anion gap: 6 (ref 5–15)
BUN: <5 mg/dL — ABNORMAL LOW (ref 6–20)
CO2: 24 mmol/L (ref 22–32)
Calcium: 8.4 mg/dL — ABNORMAL LOW (ref 8.9–10.3)
Chloride: 109 mmol/L (ref 101–111)
Creatinine, Ser: 0.63 mg/dL (ref 0.44–1.00)
GFR calc Af Amer: >60 mL/min (ref >60)
GFR calc non Af Amer: >60 mL/min (ref >60)
Glucose, Bld: 88 mg/dL (ref 65–99)
Potassium: 3.6 mmol/L (ref 3.5–5.1)
Sodium: 139 mmol/L (ref 135–145)

## 2018-03-23 LAB — CBC
HCT: 31.8 % — ABNORMAL LOW (ref 36.0–46.0)
Hemoglobin: 9.9 g/dL — ABNORMAL LOW (ref 12.0–15.0)
MCH: 27.8 pg (ref 26.0–34.0)
MCHC: 31.1 g/dL (ref 30.0–36.0)
MCV: 89.3 fL (ref 78.0–100.0)
Platelets: 264 10*3/uL (ref 150–400)
RBC: 3.56 MIL/uL — ABNORMAL LOW (ref 3.87–5.11)
RDW: 21.1 % — ABNORMAL HIGH (ref 11.5–15.5)
WBC: 5.8 10*3/uL (ref 4.0–10.5)

## 2018-03-23 LAB — MAGNESIUM: Magnesium: 1.7 mg/dL (ref 1.7–2.4)

## 2018-03-23 LAB — PHOSPHORUS: Phosphorus: 3.9 mg/dL (ref 2.5–4.6)

## 2018-03-23 MED ORDER — HYDROMORPHONE HCL 2 MG/ML IJ SOLN
1.0000 mg | INTRAMUSCULAR | Status: DC | PRN
Start: 2018-03-23 — End: 2018-03-24
  Administered 2018-03-24 (×3): 1 mg via INTRAVENOUS
  Filled 2018-03-23 (×4): qty 1

## 2018-03-23 MED ORDER — KETOROLAC TROMETHAMINE 15 MG/ML IJ SOLN
15.0000 mg | Freq: Three times a day (TID) | INTRAMUSCULAR | Status: AC
  Administered 2018-03-23 – 2018-03-24 (×3): 15 mg via INTRAVENOUS
  Filled 2018-03-23 (×3): qty 1

## 2018-03-23 MED ORDER — POTASSIUM CHLORIDE 10 MEQ/100ML IV SOLN
10.0000 meq | INTRAVENOUS | Status: AC
  Administered 2018-03-23: 10 meq via INTRAVENOUS
  Filled 2018-03-23 (×3): qty 100

## 2018-03-23 MED ORDER — MAGNESIUM SULFATE 2 GM/50ML IV SOLN
2.0000 g | Freq: Once | INTRAVENOUS | Status: AC
  Administered 2018-03-24: 2 g via INTRAVENOUS
  Filled 2018-03-23: qty 50

## 2018-03-23 MED ORDER — HYDROMORPHONE HCL 2 MG/ML IJ SOLN
0.5000 mg | INTRAMUSCULAR | Status: DC | PRN
  Administered 2018-03-23: 0.5 mg via INTRAVENOUS
  Filled 2018-03-23: qty 1

## 2018-03-23 MED ORDER — HYDROMORPHONE HCL 2 MG/ML IJ SOLN
1.0000 mg | INTRAMUSCULAR | Status: DC | PRN
  Administered 2018-03-23 (×2): 1 mg via INTRAVENOUS
  Filled 2018-03-23: qty 1

## 2018-03-23 MED ORDER — OXYCODONE-ACETAMINOPHEN 5-325 MG PO TABS
1.0000 | ORAL_TABLET | Freq: Four times a day (QID) | ORAL | Status: DC | PRN
  Administered 2018-03-23 – 2018-03-24 (×3): 2 via ORAL
  Filled 2018-03-23 (×3): qty 2

## 2018-03-23 NOTE — Progress Notes (Signed)
[facility] Progress Note     Subjective: CC:  Endorses a lot of abdominal "rumbling" today after drinking contrast. Abdominal pain improved compared to yesterday but not quite as mild as her baseline abdominal pain. Denies nausea or vomiting. Still feels a little bloated. Mobilizing.   Reports she has been sipping liquids.  Objective: Vital signs in last 24 hours: Temp:  [98.4 F (36.9 C)-98.6 F (37 C)] 98.6 F (37 C) (04/12 0434) Pulse Rate:  [60-65] 60 (04/12 0434) Resp:  [16] 16 (04/12 0434) BP: (116-128)/(69-80) 119/69 (04/12 0434) SpO2:  [99 %-100 %] 100 % (04/12 0434) Last BM Date: 03/22/18  Intake/Output from previous day: 04/11 0701 - 04/12 0700 In: 1851.7 [P.O.:1200; I.V.:551.7; IV Piggyback:100] Out: -  Intake/Output this shift: Total I/O In: 195 [P.O.:120; Other:75] Out: -   PE: Gen:  Alert, NAD, pleasant Card:  Regular rate and rhythm, pedal pulses 2+ BL Pulm:  Normal effort, clear to auscultation bilaterally Abd: Soft, non-tender, mild-mod distention, +BS  Skin: warm and dry, no rashes  Psych: A&Ox3   Lab Results:  Recent Labs    03/21/18 0634 03/23/18 0605  WBC 6.2 5.8  HGB 9.8* 9.9*  HCT 31.6* 31.8*  PLT 256 264   BMET Recent Labs    03/21/18 0634 03/23/18 0605  NA 137 139  K 3.8 3.6  CL 105 109  CO2 22 24  GLUCOSE 91 88  BUN 7 <5*  CREATININE 0.62 0.63  CALCIUM 8.6* 8.4*   PT/INR No results for input(s): LABPROT, INR in the last 72 hours. CMP     Component Value Date/Time   NA 139 03/23/2018 0605   K 3.6 03/23/2018 0605   CL 109 03/23/2018 0605   CO2 24 03/23/2018 0605   GLUCOSE 88 03/23/2018 0605   BUN <5 (L) 03/23/2018 0605   CREATININE 0.63 03/23/2018 0605   CALCIUM 8.4 (L) 03/23/2018 0605   PROT 6.1 (L) 03/21/2018 0634   ALBUMIN 3.0 (L) 03/21/2018 0634   AST 9 (L) 03/21/2018 0634   ALT 10 (L) 03/21/2018 0634   ALKPHOS 56 03/21/2018 0634   BILITOT 0.3 03/21/2018 0634   GFRNONAA >60 03/23/2018 0605    GFRAA >60 03/23/2018 0605   Lipase     Component Value Date/Time   LIPASE 16 03/19/2018 1230       Studies/Results: Dg Abd Portable 1v-small Bowel Obstruction Protocol-initial, 8 Hr Delay  Result Date: 03/23/2018 CLINICAL DATA:  42 year old female with small bowel obstruction. 8 hour delayed image following oral contrast administration. EXAM: PORTABLE ABDOMEN - 1 VIEW COMPARISON:  03/22/2018.  CT Abdomen and Pelvis 03/19/2018. FINDINGS: AP portable supine views at 0410 hours. There is no oral contrast present on the exam yesterday. These images demonstrate oral contrast in 2 different forms in the abdomen: There is flocculated contrast in the distal stomach and left abdominal small bowel loops, and there is more confluent oral contrast present in the large bowel to the level of the sigmoid colon. The bowel gas pattern remains stable since 03/19/2018. No pneumoperitoneum is evident. Stable lung bases with mild atelectasis. Scoliosis. No acute osseous abnormality identified. IMPRESSION: 1. Oral contrast has reached the distal large bowel suggesting partial if any small-bowel obstruction. 2. There is also residual desiccated contrast in the stomach and proximal small bowel loops. 3. Bowel-gas pattern remains stable since the CT on 03/19/2018. Electronically Signed   By: Genevie Ann M.D.   On: 03/23/2018 07:13   Dg Abd Portable 1v  Result  Date: 03/22/2018 CLINICAL DATA:  Small bowel obstruction. EXAM: PORTABLE ABDOMEN - 1 VIEW COMPARISON:  Radiographs yesterday.  CT 03/19/2018 FINDINGS: Similar bowel gas pattern to prior exam with prominent air-filled bowel in the central and upper abdomen, likely dilated small bowel. No evidence of free air. Pelvic phleboliths. IMPRESSION: Unchanged bowel gas pattern. Persistent gaseous distention of bowel loops in the central and upper abdomen consistent with persistent small bowel obstruction. Electronically Signed   By: Jeb Levering M.D.   On: 03/22/2018 00:56     Anti-infectives: Anti-infectives (From admission, onward)   Start     Dose/Rate Route Frequency Ordered Stop   03/21/18 1600  rifaximin (XIFAXAN) tablet 200 mg     200 mg Oral 3 times daily 03/21/18 1327     03/19/18 2130  cefTRIAXone (ROCEPHIN) 1 g in sodium chloride 0.9 % 100 mL IVPB     1 g 200 mL/hr over 30 Minutes Intravenous Every 24 hours 03/19/18 2118       Assessment/Plan pSBO - possibly related to adhesions (multiple c sections), complicated by chronic narcotic use - having BMs, minimal flatus, some bloating, no nausea - SB protocol by mouth 4/11 >> 8h delay AXR w/ contrast in the distal colon and also with some retained contrast in stomach/proximal small bowel, consistent with pSBO.   - advance diet to SOFT.   Dispo: No acute surgical needs. If tolerates a solid diet then is stable for discharge from surgical perspective.    LOS: 4 days    Jill Alexanders , [facility] 03/23/2018, 9:32 AM Pager: [phone] Consults: [phone] Mon-Fri 7:00 am-4:30 pm Sat-Sun 7:00 am-11:30 am

## 2018-03-23 NOTE — Progress Notes (Signed)
Subjective: Patient was seen and examined at bedside. Reports multiple bowel movements described as soft, solid as well as loose and watery. Complains of continued abdominal pain. Able to tolerate full liquid diet without associated nausea, vomiting or worsening of abdominal pain  Objective: Vital signs in last 24 hours: Temp:  [98.4 F (36.9 C)-98.6 F (37 C)] 98.6 F (37 C) (04/12 0434) Pulse Rate:  [60-65] 60 (04/12 0434) Resp:  [16] 16 (04/12 0434) BP: (116-128)/(69-80) 119/69 (04/12 0434) SpO2:  [99 %-100 %] 100 % (04/12 0434) Weight change:  Last BM Date: 03/23/18  DE:YCXKGYJ up in bed, appears comfortable GENERAL:mild pallor, moist oral mucosa ABDOMEN:distended but to a lesser extent than yesterday,soft, nontender, normoactive bowel sounds EXTREMITIES:no edema  Lab Results: Results for orders placed or performed during the hospital encounter of 03/19/18 (from the past 48 hour(s))  TSH     Status: None   Collection Time: 03/22/18  3:25 AM  Result Value Ref Range   TSH 2.641 0.350 - 4.500 uIU/mL    Comment: Performed by a 3rd Generation assay with a functional sensitivity of <=0.01 uIU/mL. Performed at [facility], [place] [address], [place], [place] [address]   CBC     Status: Abnormal   Collection Time: 03/23/18  6:05 AM  Result Value Ref Range   WBC 5.8 4.0 - 10.5 K/uL   RBC 3.56 (L) 3.87 - 5.11 MIL/uL   Hemoglobin 9.9 (L) 12.0 - 15.0 g/dL   HCT 31.8 (L) 36.0 - 46.0 %   MCV 89.3 78.0 - 100.0 fL   MCH 27.8 26.0 - 34.0 pg   MCHC 31.1 30.0 - 36.0 g/dL   RDW 21.1 (H) 11.5 - 15.5 %   Platelets 264 150 - 400 K/uL    Comment: Performed at [facility], [place] [address] [place], [place] [address]  Basic metabolic panel     Status: Abnormal   Collection Time: 03/23/18  6:05 AM  Result Value Ref Range   Sodium 139 135 - 145 mmol/L   Potassium 3.6 3.5 - 5.1 mmol/L   Chloride 109 101 - 111 mmol/L   CO2 24 22 - 32 mmol/L   Glucose, Bld 88 65 - 99 mg/dL   BUN <5  (L) 6 - 20 mg/dL   Creatinine, Ser 0.63 0.44 - 1.00 mg/dL   Calcium 8.4 (L) 8.9 - 10.3 mg/dL   GFR calc non Af Amer >60 >60 mL/min   GFR calc Af Amer >60 >60 mL/min    Comment: (NOTE) The eGFR has been calculated using the CKD EPI equation. This calculation has not been validated in all clinical situations. eGFR's persistently <60 mL/min signify possible Chronic Kidney Disease.    Anion gap 6 5 - 15    Comment: Performed at [facility] [address], [place], [place] [address]  Magnesium     Status: None   Collection Time: 03/23/18  6:05 AM  Result Value Ref Range   Magnesium 1.7 1.7 - 2.4 mg/dL    Comment: Performed at [facility] [address] [place], [place] [address]  Phosphorus     Status: None   Collection Time: 03/23/18  6:05 AM  Result Value Ref Range   Phosphorus 3.9 2.5 - 4.6 mg/dL    Comment: Performed at [facility] [address], [place],  [address]    Studies/Results: Dg Abd Portable 1v-small Bowel Obstruction Protocol-initial, 8 Hr Delay  Result Date: 03/23/2018  CLINICAL DATA:  42 year old female with small bowel obstruction. 8 hour delayed image following oral contrast administration. EXAM: PORTABLE ABDOMEN - 1 VIEW COMPARISON:  03/22/2018.  CT Abdomen and Pelvis 03/19/2018. FINDINGS: AP portable supine views at 0410 hours. There is no oral contrast present on the exam yesterday. These images demonstrate oral contrast in 2 different forms in the abdomen: There is flocculated contrast in the distal stomach and left abdominal small bowel loops, and there is more confluent oral contrast present in the large bowel to the level of the sigmoid colon. The bowel gas pattern remains stable since 03/19/2018. No pneumoperitoneum is evident. Stable lung bases with mild atelectasis. Scoliosis. No acute osseous abnormality identified. IMPRESSION: 1. Oral contrast has reached the distal large bowel suggesting partial if any small-bowel obstruction. 2.  There is also residual desiccated contrast in the stomach and proximal small bowel loops. 3. Bowel-gas pattern remains stable since the CT on 03/19/2018. Electronically Signed   By: Genevie Ann M.D.   On: 03/23/2018 07:13   Dg Abd Portable 1v  Result Date: 03/22/2018 CLINICAL DATA:  Small bowel obstruction. EXAM: PORTABLE ABDOMEN - 1 VIEW COMPARISON:  Radiographs yesterday.  CT 03/19/2018 FINDINGS: Similar bowel gas pattern to prior exam with prominent air-filled bowel in the central and upper abdomen, likely dilated small bowel. No evidence of free air. Pelvic phleboliths. IMPRESSION: Unchanged bowel gas pattern. Persistent gaseous distention of bowel loops in the central and upper abdomen consistent with persistent small bowel obstruction. Electronically Signed   By: Jeb Levering M.D.   On: 03/22/2018 00:56    Medications: I have reviewed the patient's current medications.  Assessment: Small bowel obstruction-abdominal x-ray from today showed oral contrast has reached distal large bowel suggesting partial if any small bowel obstruction.   Plan: Patient currently on lactulose 30 g twice a day, linzess 290 g daily, Movantik 25 mg by mouth daily,MiraLAX 17 g by mouth twice a day, senna 1 tablet by mouth daily, Fleet Enema as needed.  To be started on soft diet. Okay to discharge from GI standpoint. Recommend to discharge patient on movantik 25 mg daily. Other as needed medications would be lactulose/ senna/miralax/linzess. Patient to f/u with Dr.Buccini in 1-2 weeks.     Ronnette Juniper 03/23/2018, 12:20 PM   Pager [phone] If no answer or after 5 PM call [phone]

## 2018-03-23 NOTE — Progress Notes (Signed)
PROGRESS NOTE    [name]  OIZ:[id number] DOB: 09/21/1976 DOA: 03/19/2018 PCP: [facility]  Brief Narrative: Patient is a 42 year old female with chronic pain, narcotic dependency, DOB attributed to fibroids, chronic iron deficiency anemia presented with abdominal distention and pain over the past few days.  She reported nausea without vomiting, last BM 6 days prior to admission despite using MiraLAX and amitiza at home.  Reported ongoing vaginal bleeding for which she follows with OB/GYN.  In ED CT of the abdomen and pelvis showed possible developing or partial SBO with mild colitis in the right and proximal transverse colon, persistent fluid in the pelvis.  EDP discussed with surgery who reviewed CT scans and felt likely opioid induced constipation and recommended aggressive bowel regimen and advised against any surgical intervention.  Patient was admitted for further workup and continues to have intractable Abdominal Pain. Still has some distention as well. Now having Bowel movements after several motility agents added.   Assessment & Plan:   Principal Problem:   Partial small bowel obstruction (HCC) Active Problems:   Abdominal pain in female   Constipation due to opioid therapy   Acute lower UTI   Dysfunctional uterine bleeding   Chronically on opiate therapy  Partial small bowel obstruction (HCC) -Appears Secondary to chronic narcotic dependency -CT abdomen and pelvis suggested partial/early SBO with mild colitis -Patient was started on bowel regimen, IV fluid hydration, stated had a small hard BM yesterday and had better BM today and continues to improve -GI was consulted, seen by Dr. Cristina Gong, also felt partial SBO from opioid induced intestinal dysmotility versus low probability of adhesions causing SBO. Per GI, questionable proximal colitis on admission CT likely artifactual. -Currently no fevers or leukocytosis -Patient and family not understanding that Opiates slowing  down the gut motility, worsening distention and pain after several discussions  -Ordered lactulose, smog enema, continue linzess, MiraLAX twice a day, Senna-Docusate -*Called by RN again today, patient's mother at the bedside wants to talk to MD regarding the plan of care. Explained in great depth to the patient's mother regarding the rationale behind stopping IV Narcotics as it will worsen the ileus/SBO by delaying the gut motility.  Patient has a history of recurrent SBO with previous surgeries.  Explained to the patient's mother that we have to address her SBO conservatively before exploratory laparotomy to open up which will be surgery's discretion and they will not do because she had contrast in the colon and has a partial SBO -Patient's mother is adamant and feels that Narcotics are not slowing down her daughters gut and that she is having bowel movements despite Narcotics (she is on Multiple Bowel stimulants); See discussion below  -Patient now agreed for NG tube but never placed -General Surgery Dr. Elsie Ra evaluated and diet advanced -Keep Mag >2.0 and K+ >4.0 and replete -Repeat Abdominal KUB in AM   Intractable Abdominal Pain and Distention -Complaining of a 10/10 in severity still  -Patient and family upset that IV Narcotics were attempting to be tapered in anticipation of Discharge and want to find out reasoning for Pain and tried to explain multiple times however they do not understand after reiteration.   -Discussed with the Mother at length -C/w Acetaminophen and Oxycodone, added IV Ketorolac,  -Dilaudid restarted at 1 mg q4h given severity of pain still and discussed the risks of worsening Abdomen; After lengthy discussion with patient and mother will try IV Opiates for tonight and if patient's pain is worse in AM  will stop in AM   UTI -Urine culture was ordered 4/8 still not collected until today -Patient is on IV Rocephin  Iron deficiency anemia secondary to DOB and  fibroids, normocytic -Hold off on iron supplementation due to severe constipation -H&H currently stable, monitor closely, baseline hemoglobin around 8-9 and is Stable -Watch for bleeding while on Ketorolac  Chronic narcotic use with chronic pain syndrome -Patient reports that she follows pain clinic and will need to follow up at D/C as she will be given NO SCRIPTS For OPIATES AT DISCHARGE.   DVT prophylaxis: Lovenox  Code Status: FULL CODE Family Communication: Discussed with the mother at length over the phone Disposition Plan: Anticipate D/C in next 24-48 hours if improved  Consultants:   General Surgery  Gastroenterology    Procedures: None   Antimicrobials:  Anti-infectives (From admission, onward)   Start     Dose/Rate Route Frequency Ordered Stop   03/21/18 1600  rifaximin (XIFAXAN) tablet 200 mg     200 mg Oral 3 times daily 03/21/18 1327     03/19/18 2130  cefTRIAXone (ROCEPHIN) 1 g in sodium chloride 0.9 % 100 mL IVPB     1 g 200 mL/hr over 30 Minutes Intravenous Every 24 hours 03/19/18 2118       Subjective: Seen and examined and now having bowel movements but abdomen remains distended and painful to palpate. No CP or SOB. Feels like she is getting better but biggest complaint is Abdominal Pain.   Objective: Vitals:   03/22/18 0609 03/22/18 1454 03/22/18 1944 03/23/18 0434  BP: 120/74 116/75 128/80 119/69  Pulse: 61 60 65 60  Resp:  16 16 16   Temp: 98.9 F (37.2 C) 98.4 F (36.9 C) 98.5 F (36.9 C) 98.6 F (37 C)  TempSrc: Oral Oral Oral Oral  SpO2: 100% 100% 99% 100%  Weight:      Height:        Intake/Output Summary (Last 24 hours) at 03/23/2018 1093 Last data filed at 03/23/2018 0003 Gross per 24 hour  Intake 1851.67 ml  Output -  Net 1851.67 ml   Filed Weights   03/19/18 1223  Weight: 69.4 kg (153 lb)   Examination: Physical Exam:  Constitutional: WN/WD AAF with some abdominal pain and appears calm but uncomfortable Eyes: Lids and  conjunctivae normal, sclerae anicteric  ENMT: External Ears, Nose appear normal. Grossly normal hearing. Mucous membranes are moist.  Neck: Appears normal, supple, no cervical masses, normal ROM, no appreciable thyromegaly; no JVD Respiratory: Diminished to auscultation bilaterally, no wheezing, rales, rhonchi or crackles. Normal respiratory effort and patient is not tachypenic. No accessory muscle use.  Cardiovascular: RRR, no murmurs / rubs / gallops. S1 and S2 auscultated. No extremity edema.  Abdomen: Soft, Tender to palpate, Distended. Bowel sounds positive but diminished .  GU: Deferred. Musculoskeletal: No clubbing / cyanosis of digits/nails. No joint deformity upper and lower extremities. Good ROM, no contractures.  Skin: No rashes, lesions, ulcers on a limited skin evaluation. No induration; Warm and dry.  Neurologic: CN 2-12 grossly intact with no focal deficits.  Romberg sign and cerebellar reflexes not assessed.  Psychiatric: Normal judgment and insight. Alert and oriented x 3. Anxious mood and Flat affect.   Data Reviewed: I have personally reviewed following labs and imaging studies  CBC: Recent Labs  Lab 03/19/18 1230 03/20/18 0504 03/21/18 0634 03/23/18 0605  WBC 11.6* 8.7 6.2 5.8  NEUTROABS  --  5.7  --   --   HGB  11.9* 10.6* 9.8* 9.9*  HCT 37.4 33.3* 31.6* 31.8*  MCV 87.0 87.6 88.5 89.3  PLT 295 274 256 315   Basic Metabolic Panel: Recent Labs  Lab 03/19/18 1230 03/20/18 0504 03/21/18 0634 03/23/18 0605  NA 135 136 137 139  K 3.6 3.5 3.8 3.6  CL 104 105 105 109  CO2 20* 22 22 24   GLUCOSE 111* 94 91 88  BUN 5* 8 7 <5*  CREATININE 0.63 0.69 0.62 0.63  CALCIUM 9.1 8.6* 8.6* 8.4*  MG  --  2.0  --   --    GFR: Estimated Creatinine Clearance: 88.5 mL/min (by C-G formula based on SCr of 0.63 mg/dL). Liver Function Tests: Recent Labs  Lab 03/19/18 1230 03/20/18 0504 03/21/18 0634  AST 12* 9* 9*  ALT 14 11* 10*  ALKPHOS 79 61 56  BILITOT 0.7 0.6 0.3    PROT 7.0 6.2* 6.1*  ALBUMIN 3.7 3.1* 3.0*   Recent Labs  Lab 03/19/18 1230  LIPASE 16   No results for input(s): AMMONIA in the last 168 hours. Coagulation Profile: No results for input(s): INR, PROTIME in the last 168 hours. Cardiac Enzymes: No results for input(s): CKTOTAL, CKMB, CKMBINDEX, TROPONINI in the last 168 hours. BNP (last 3 results) No results for input(s): PROBNP in the last 8760 hours. HbA1C: No results for input(s): HGBA1C in the last 72 hours. CBG: No results for input(s): GLUCAP in the last 168 hours. Lipid Profile: No results for input(s): CHOL, HDL, LDLCALC, TRIG, CHOLHDL, LDLDIRECT in the last 72 hours. Thyroid Function Tests: Recent Labs    03/22/18 0325  TSH 2.641   Anemia Panel: No results for input(s): VITAMINB12, FOLATE, FERRITIN, TIBC, IRON, RETICCTPCT in the last 72 hours. Sepsis Labs: No results for input(s): PROCALCITON, LATICACIDVEN in the last 168 hours.  No results found for this or any previous visit (from the past 240 hour(s)).   Radiology Studies: Dg Abd Portable 1v-small Bowel Obstruction Protocol-initial, 8 Hr Delay  Result Date: 03/23/2018 CLINICAL DATA:  42 year old female with small bowel obstruction. 8 hour delayed image following oral contrast administration. EXAM: PORTABLE ABDOMEN - 1 VIEW COMPARISON:  03/22/2018.  CT Abdomen and Pelvis 03/19/2018. FINDINGS: AP portable supine views at 0410 hours. There is no oral contrast present on the exam yesterday. These images demonstrate oral contrast in 2 different forms in the abdomen: There is flocculated contrast in the distal stomach and left abdominal small bowel loops, and there is more confluent oral contrast present in the large bowel to the level of the sigmoid colon. The bowel gas pattern remains stable since 03/19/2018. No pneumoperitoneum is evident. Stable lung bases with mild atelectasis. Scoliosis. No acute osseous abnormality identified. IMPRESSION: 1. Oral contrast has reached  the distal large bowel suggesting partial if any small-bowel obstruction. 2. There is also residual desiccated contrast in the stomach and proximal small bowel loops. 3. Bowel-gas pattern remains stable since the CT on 03/19/2018. Electronically Signed   By: Genevie Ann M.D.   On: 03/23/2018 07:13   Dg Abd Portable 1v  Result Date: 03/22/2018 CLINICAL DATA:  Small bowel obstruction. EXAM: PORTABLE ABDOMEN - 1 VIEW COMPARISON:  Radiographs yesterday.  CT 03/19/2018 FINDINGS: Similar bowel gas pattern to prior exam with prominent air-filled bowel in the central and upper abdomen, likely dilated small bowel. No evidence of free air. Pelvic phleboliths. IMPRESSION: Unchanged bowel gas pattern. Persistent gaseous distention of bowel loops in the central and upper abdomen consistent with persistent small bowel obstruction. Electronically  Signed   By: Jeb Levering M.D.   On: 03/22/2018 00:56   Scheduled Meds: . bisacodyl  10 mg Rectal TID  . enoxaparin (LOVENOX) injection  40 mg Subcutaneous Q24H  . feeding supplement (ENSURE ENLIVE)  237 mL Oral BID BM  . lidocaine  1 application Other Once  . linaclotide  290 mcg Oral QAC breakfast  . naloxegol oxalate  25 mg Oral Daily  . polyethylene glycol  17 g Oral BID  . rifaximin  200 mg Oral TID  . saccharomyces boulardii  250 mg Oral BID  . senna-docusate  1 tablet Oral BID   Continuous Infusions: . sodium chloride 100 mL/hr at 03/22/18 1709  . cefTRIAXone (ROCEPHIN)  IV Stopped (03/23/18 0000)    LOS: 4 days    Kerney Elbe, DO [facility] Pager [phone]  If 7PM-7AM, please contact night-coverage [email or web address] Password [facility] 03/23/2018, 8:12 AM

## 2018-03-23 NOTE — Discharge Instructions (Signed)
Low-Fiber Diet °Fiber is found in fruits, vegetables, and whole grains. A low-fiber diet restricts fibrous foods that are not digested in the small intestine. A diet containing about 10-15 grams of fiber per day is considered low fiber. Low-fiber diets may be used to: °· Promote healing and rest the bowel during intestinal flare-ups. °· Prevent blockage of a partially obstructed or narrowed gastrointestinal tract. °· Reduce fecal weight and volume. °· Slow the movement of feces. ° °You may be on a low-fiber diet as a transitional diet following surgery, after an injury (trauma), or because of a short (acute) or lifelong (chronic) illness. Your health care provider will determine the length of time you need to stay on this diet. °What do I need to know about a low-fiber diet? °Always check the fiber content on the packaging's Nutrition Facts label, especially on foods from the grains list. Ask your dietitian if you have questions about specific foods that are related to your condition, especially if the food is not listed below. In general, a low-fiber food will have less than 2 g of fiber. °What foods can I eat? °Grains °All breads and crackers made with white flour. Sweet rolls, doughnuts, waffles, pancakes, French toast, bagels. Pretzels, Melba toast, zwieback. Well-cooked cereals, such as cornmeal, farina, or cream cereals. Dry cereals that do not contain whole grains, fruit, or nuts, such as refined corn, wheat, rice, and oat cereals. Potatoes prepared any way without skins, plain pastas and noodles, refined white rice. Use white flour for baking and making sauces. Use allowed list of grains for casseroles, dumplings, and puddings. °Vegetables °Strained tomato and vegetable juices. Fresh lettuce, cucumber, spinach. Well-cooked (no skin or pulp) or canned vegetables, such as asparagus, bean sprouts, beets, carrots, green beans, mushrooms, potatoes, pumpkin, spinach, yellow squash, tomato sauce/puree, turnips,  yams, and zucchini. Keep servings limited to ½ cup. °Fruits °All fruit juices except prune juice. Cooked or canned fruits without skin and seeds, such as applesauce, apricots, cherries, fruit cocktail, grapefruit, grapes, mandarin oranges, melons, peaches, pears, pineapple, and plums. Fresh fruits without skin, such as apricots, avocados, bananas, melons, pineapple, nectarines, and peaches. Keep servings limited to ½ cup or 1 piece. °Meat and Other Protein Sources °Ground or well-cooked tender beef, ham, veal, lamb, pork, or poultry. Eggs, plain cheese. Fish, oysters, shrimp, lobster, and other seafood. Liver, organ meats. Smooth nut butters. °Dairy °All milk products and alternative dairy substitutes, such as soy, rice, almond, and coconut, not containing added whole nuts, seeds, or added fruit. °Beverages °Decaf coffee, fruit, and vegetable juices or smoothies (small amounts, with no pulp or skins, and with fruits from allowed list), sports drinks, herbal tea. °Condiments °Ketchup, mustard, vinegar, cream sauce, cheese sauce, cocoa powder. Spices in moderation, such as allspice, basil, bay leaves, celery powder or leaves, cinnamon, cumin powder, curry powder, ginger, mace, marjoram, onion or garlic powder, oregano, paprika, parsley flakes, ground pepper, rosemary, sage, savory, tarragon, thyme, and turmeric. °Sweets and Desserts °Plain cakes and cookies, pie made with allowed fruit, pudding, custard, cream pie. Gelatin, fruit, ice, sherbet, frozen ice pops. Ice cream, ice milk without nuts. Plain hard candy, honey, jelly, molasses, syrup, sugar, chocolate syrup, gumdrops, marshmallows. Limit overall sugar intake. °Fats and Oil °Margarine, butter, cream, mayonnaise, salad oils, plain salad dressings made from allowed foods. Choose healthy fats such as olive oil, canola oil, and omega-3 fatty acids (such as found in salmon or tuna) when possible. °Other °Bouillon, broth, or cream soups made from allowed foods. Any    strained soup. Casseroles or mixed dishes made with allowed foods. °The items listed above may not be a complete list of recommended foods or beverages. Contact your dietitian for more options. °What foods are not recommended? °Grains °All whole wheat and whole grain breads and crackers. Multigrains, rye, bran seeds, nuts, or coconut. Cereals containing whole grains, multigrains, bran, coconut, nuts, raisins. Cooked or dry oatmeal, steel-cut oats. Coarse wheat cereals, granola. Cereals advertised as high fiber. Potato skins. Whole grain pasta, wild or brown rice. Popcorn. Coconut flour. Bran, buckwheat, corn bread, multigrains, rye, wheat germ. °Vegetables °Fresh, cooked or canned vegetables, such as artichokes, asparagus, beet greens, broccoli, Brussels sprouts, cabbage, celery, cauliflower, corn, eggplant, kale, legumes or beans, okra, peas, and tomatoes. Avoid large servings of any vegetables, especially raw vegetables. °Fruits °Fresh fruits, such as apples with or without skin, berries, cherries, figs, grapes, grapefruit, guavas, kiwis, mangoes, oranges, papayas, pears, persimmons, pineapple, and pomegranate. Prune juice and juices with pulp, stewed or dried prunes. Dried fruits, dates, raisins. Fruit seeds or skins. Avoid large servings of all fresh fruits. °Meats and Other Protein Sources °Tough, fibrous meats with gristle. Chunky nut butter. Cheese made with seeds, nuts, or other foods not recommended. Nuts, seeds, legumes (beans, including baked beans), dried peas, beans, lentils. °Dairy °Yogurt or cheese that contains nuts, seeds, or added fruit. °Beverages °Fruit juices with high pulp, prune juice. Caffeinated coffee and teas. °Condiments °Coconut, maple syrup, pickles, olives. °Sweets and Desserts °Desserts, cookies, or candies that contain nuts or coconut, chunky peanut butter, dried fruits. Jams, preserves with seeds, marmalade. Large amounts of sugar and sweets. Any other dessert made with fruits from  the not recommended list. °Other °Soups made from vegetables that are not recommended or that contain other foods not recommended. °The items listed above may not be a complete list of foods and beverages to avoid. Contact your dietitian for more information. °This information is not intended to replace advice given to you by your health care provider. Make sure you discuss any questions you have with your health care provider. °Document Released: 05/20/2002 Document Revised: 05/05/2016 Document Reviewed: 10/21/2013 °Elsevier Interactive Patient Education © 2017 [employer] ° °

## 2018-03-23 NOTE — Plan of Care (Signed)

## 2018-03-24 ENCOUNTER — Inpatient Hospital Stay (HOSPITAL_COMMUNITY): Payer: Medicaid Other

## 2018-03-24 DIAGNOSIS — E876 Hypokalemia: Secondary | ICD-10-CM

## 2018-03-24 LAB — COMPREHENSIVE METABOLIC PANEL
ALT: 14 U/L (ref 14–54)
AST: 16 U/L (ref 15–41)
Albumin: 3.5 g/dL (ref 3.5–5.0)
Alkaline Phosphatase: 64 U/L (ref 38–126)
Anion gap: 9 (ref 5–15)
BUN: <5 mg/dL — ABNORMAL LOW (ref 6–20)
CO2: 23 mmol/L (ref 22–32)
Calcium: 8.9 mg/dL (ref 8.9–10.3)
Chloride: 104 mmol/L (ref 101–111)
Creatinine, Ser: 0.6 mg/dL (ref 0.44–1.00)
GFR calc Af Amer: >60 mL/min (ref >60)
GFR calc non Af Amer: >60 mL/min (ref >60)
Glucose, Bld: 94 mg/dL (ref 65–99)
Potassium: 3.4 mmol/L — ABNORMAL LOW (ref 3.5–5.1)
Sodium: 136 mmol/L (ref 135–145)
Total Bilirubin: 0.2 mg/dL — ABNORMAL LOW (ref 0.3–1.2)
Total Protein: 7.1 g/dL (ref 6.5–8.1)

## 2018-03-24 LAB — MAGNESIUM: Magnesium: 2.3 mg/dL (ref 1.7–2.4)

## 2018-03-24 LAB — CBC WITH DIFFERENTIAL/PLATELET
Basophils Absolute: 0 10*3/uL (ref 0.0–0.1)
Basophils Relative: 0 %
Eosinophils Absolute: 0.3 10*3/uL (ref 0.0–0.7)
Eosinophils Relative: 5 %
HCT: 34.8 % — ABNORMAL LOW (ref 36.0–46.0)
Hemoglobin: 10.7 g/dL — ABNORMAL LOW (ref 12.0–15.0)
Lymphocytes Relative: 36 %
Lymphs Abs: 2 10*3/uL (ref 0.7–4.0)
MCH: 27.2 pg (ref 26.0–34.0)
MCHC: 30.7 g/dL (ref 30.0–36.0)
MCV: 88.3 fL (ref 78.0–100.0)
Monocytes Absolute: 0.4 10*3/uL (ref 0.1–1.0)
Monocytes Relative: 8 %
Neutro Abs: 2.9 10*3/uL (ref 1.7–7.7)
Neutrophils Relative %: 51 %
Platelets: 294 10*3/uL (ref 150–400)
RBC: 3.94 MIL/uL (ref 3.87–5.11)
RDW: 20.6 % — ABNORMAL HIGH (ref 11.5–15.5)
WBC: 5.7 10*3/uL (ref 4.0–10.5)

## 2018-03-24 LAB — URINE CULTURE: Culture: NO GROWTH

## 2018-03-24 LAB — PHOSPHORUS: Phosphorus: 3.8 mg/dL (ref 2.5–4.6)

## 2018-03-24 MED ORDER — POTASSIUM CHLORIDE 20 MEQ/15ML (10%) PO SOLN
40.0000 meq | Freq: Two times a day (BID) | ORAL | Status: AC
  Administered 2018-03-24 (×2): 40 meq via ORAL
  Filled 2018-03-24 (×3): qty 30

## 2018-03-24 MED ORDER — POTASSIUM CHLORIDE 20 MEQ/15ML (10%) PO SOLN
20.0000 meq | Freq: Once | ORAL | Status: AC
  Administered 2018-03-24: 20 meq via ORAL
  Filled 2018-03-24: qty 15

## 2018-03-24 MED ORDER — KETOROLAC TROMETHAMINE 15 MG/ML IJ SOLN
15.0000 mg | Freq: Three times a day (TID) | INTRAMUSCULAR | Status: AC
  Administered 2018-03-24 – 2018-03-25 (×3): 15 mg via INTRAVENOUS
  Filled 2018-03-24 (×3): qty 1

## 2018-03-24 MED ORDER — ACETAMINOPHEN 10 MG/ML IV SOLN: 1000.0000 mg | Freq: Four times a day (QID) | INTRAVENOUS | Status: DC

## 2018-03-24 MED ORDER — OXYCODONE HCL 5 MG PO TABS
5.0000 mg | ORAL_TABLET | ORAL | Status: DC | PRN
  Administered 2018-03-24 – 2018-03-25 (×4): 10 mg via ORAL
  Filled 2018-03-24 (×6): qty 2

## 2018-03-24 MED ORDER — ACETAMINOPHEN 500 MG PO TABS
1000.0000 mg | ORAL_TABLET | Freq: Four times a day (QID) | ORAL | Status: DC
  Administered 2018-03-24 – 2018-03-25 (×5): 1000 mg via ORAL
  Filled 2018-03-24 (×5): qty 2

## 2018-03-24 NOTE — Progress Notes (Signed)
We came by earlier to see the patient but nobody was in the room. We will re-attempt to see later today vs tomorrow.  Imaging reviewed from today which is reassuring - contrast is identified in the ascending colon and by report, she is having bowel movements and tolerating food. Additionally, remains afebrile, normal vitals with normal labs.  Sharon Mt. Dema Severin, M.D. [facility]

## 2018-03-24 NOTE — Progress Notes (Signed)
Pt refused to take miralax and senokot. Pt verbalized "I don't need those medicine, it makes my stomach 'rumbling' and it's so painful, I've been having my bowels too so I'm not gonna take it". Again, educated pt about the benefits of taking bowel regimen but pt still refused. Reiterate pt to inform staff whenever she has BM for staff to assess and document. Pt verbalized understanding.

## 2018-03-24 NOTE — Progress Notes (Signed)
[facility] Gastroenterology Progress Note  BENIGNA DELISI 42 y.o. August 14, 1976   Subjective: Patient sitting up in bed in no distress stating her abdominal pain is a 9/10 in intensity with 10 being the worse pain in her life. Reportedly moving her bowels daily (loose stools) but not documented in chart (Nurse reports patient is flushing before staff can see). Boyfriend at bedside. Nurse, Douglass Rivers, present during my entire evaluation. Patient calls mother to speak to me on speakerphone about her dissatisfaction with the care of her daughter stating that her pain is not being adequately controlled.  Objective: Vital signs: Vitals:   03/24/18 0844 03/24/18 1246  BP: (!) 147/80 133/87  Pulse: (!) 59 (!) 58  Resp: 12 18  Temp: 98.2 F (36.8 C) 98.6 F (37 C)  SpO2: 100% 97%    Physical Exam: Gen: alert, no acute distress  HEENT: anicteric sclera CV: RRR Chest: CTA B Abd: distended, minimal diffuse tenderness without guarding, soft, +BS Ext: no edema  Lab Results: Recent Labs    03/23/18 0605 03/24/18 0546  NA 139 136  K 3.6 3.4*  CL 109 104  CO2 24 23  GLUCOSE 88 94  BUN <5* <5*  CREATININE 0.63 0.60  CALCIUM 8.4* 8.9  MG 1.7 2.3  PHOS 3.9 3.8   Recent Labs    03/24/18 0546  AST 16  ALT 14  ALKPHOS 64  BILITOT 0.2*  PROT 7.1  ALBUMIN 3.5   Recent Labs    03/23/18 0605 03/24/18 0546  WBC 5.8 5.7  NEUTROABS  --  2.9  HGB 9.9* 10.7*  HCT 31.8* 34.8*  MCV 89.3 88.3  PLT 264 294      Assessment/Plan: Partial SBO in the setting of chronic pain. Long discussion with patient (nurse present) that she will be at continued risk of slow motility of her gut with ongoing pain meds especially in a stronger formulation but she disagrees with my medical opinion stating that her bowels are moving despite pain meds. She feels that her current PO pain meds are not controlling her pain levels implying that she is on higher pain meds at home. Reports vaginal bleeding and  thinks she has cancer. Denies rectal bleeding. Hgb 10.7.  Agree with minimizing opioids if possible due to worsening of the function of her gut. Need strict I/Os. If she is continuing to move her bowels, then continue supportive care. Defer to hospitalist whether adjustment in her pain meds is needed. If distention worsens or obstipation occurs, then may need an updated CT scan but will hold off on repeat CT scan for now. Continue Miralax. Defer to primary team timing of GYN evaluation for her reported vaginal bleeding. [facility] will sign off. Call if questions.   [place] C. 03/24/2018, 1:31 PM   AFTER 5 PM or on weekends please call [phone] ID: Ebony Cargo, female   DOB: 07-28-1976, 42 y.o.   MRN: [id number]

## 2018-03-24 NOTE — Plan of Care (Signed)
  Problem: Nutrition: Goal: Adequate nutrition will be maintained Outcome: Progressing   Problem: Elimination: Goal: Will not experience complications related to bowel motility Outcome: Progressing   Problem: Safety: Goal: Ability to remain free from injury will improve Outcome: Progressing   

## 2018-03-24 NOTE — Progress Notes (Signed)
PROGRESS NOTE   [name]  UVO:[id number] DOB: [birth year] DOA: 03/19/2018 PCP: [facility]  Brief Narrative: Patient is a 42 year old female with chronic pain, narcotic dependency, DOB attributed to fibroids, chronic iron deficiency anemia presented with abdominal distention and pain over the past few days.  She reported nausea without vomiting, last BM 6 days prior to admission despite using MiraLAX and amitiza at home.  Reported ongoing vaginal bleeding for which she follows with OB/GYN.  In ED CT of the abdomen and pelvis showed possible developing or partial SBO with mild colitis in the right and proximal transverse colon, persistent fluid in the pelvis.  EDP discussed with surgery who reviewed CT scans and felt likely opioid induced constipation and recommended aggressive bowel regimen and advised against any surgical intervention.    Patient was admitted for further workup and continued to have intractable Abdominal Pain and has some distention as well. Now reportedly having Bowel movements after several motility agents added. Patient's nurse stating that she has been flushing bowel movements prior to examination.    Today she was seen ambulating around the unit in NAD talking on the phone with her mother but still requesting IV Narcotics. I discontinued them this AM and adjusted her pain regimen as she is not Nauseous or vomiting and is continuing to have bowel movements. Asked GI and Surgery to weigh in again and patient was not in room when Surgery went to evaluate her but GI evaluated they agree that Opioids will worsen her pSBO. Patient unhappy with care and asked for a second hospitalist opinion and my Director on Call Dr. Cruzita Lederer came to evaluate and is in agreement with my medical decision making. Family upset and acting belligerent on Phone and requested to speak to the [facility] who feels Ive done everything appropriately.   Assessment & Plan:   Principal Problem:   Partial small  bowel obstruction (HCC) Active Problems:   Abdominal pain in female   Constipation due to opioid therapy   Acute lower UTI   Dysfunctional uterine bleeding   Chronically on opiate therapy  Partial small bowel obstruction (HCC) -Appears Secondary to chronic narcotic dependency -CT abdomen and pelvis suggested partial/early SBO with mild colitis but doubt colitis  -Patient was started on bowel regimen, IV fluid hydration, and is having bowel movements -GI was consulted, seen by Dr. Cristina Gong, also felt partial SBO from opioid induced intestinal dysmotility versus low probability of adhesions causing SBO. Per GI, questionable proximal colitis on admission CT likely artifactual; GI signed off but asked to re-evaluate and Dr. Michail Sermon feels that this is opiate induced as well and agrees with minimizing narcotics and feels  -Currently no fevers or leukocytosis -Patient and family continue to not understand that Opiates slowing down the gut motility, worsening distention and pain after several discussions  -During Hospitalization has had Lactulose, smog enema, continue linzess, MiraLAX twice a day, Senna-Docusate -Patient's mother belligerent over the phone and even cursing at me. Explained in great depth to the patient's mother regarding the rationale behind stopping IV Narcotics as it will worsen the ileus/SBO by delaying the gut motility and feel that since she is taking in po without issues and having bowel movements IV Narcotics are not indicated. -Patient has a history of recurrent SBO with previous surgeries. Patient wanted a second Surgical Opinion so Surgery was re-consulted and they reviewed imaging and was re-assuring but have yet to assess the patient but I feel they will think she is not a  surgical candidate  -Patient's mother is adamant and feels that Narcotics are not slowing down her daughters gut and that she is having bowel movements despite Narcotics (she is on Multiple Bowel stimulants);  See discussion below  -Diet was advanced to Soft Diet and patient tolerating with no Nausea or Vomiting  -Keep Mag >2.0 and K+ >4.0 and replete -Repeat Abdominal KUB this AM showed The bowel gas pattern remains stable since the CT on 03/19/2018, with maximal to mildly dilated mid abdominal small bowel. The most recently administered oral contrast is in the ascending colon. Consider continued partial small bowel obstruction or ileus. -Will not escalate to IV Narcotics and will stop IV Dilaudid  Intractable Abdominal Pain and Distention -Complaining of a 9/10 in severity still but was seen ambulating the halls on phone in No Distress -Patient and family upset still and wanting second opinions -Mother was cursing at me over the Phone and being belligerent and patient not understanding; Per mother's report she was crying to her mother over the phone being in pain but Staff relates no such incidents  -Changed pain regimen to Acetaminophen 1000 mg po q6h, Oxycodone 5-10 mg po q4hprn, and IV Ketorolac. -Dilaudid 1 mg q4h stopped given that patient is able to tolerate po, Having bowel movements, and ambulating the halls; There is no Indication for IV Narcotics this time and always has chronic pain  UTI -Urine culture was ordered 4/8 still not collected until today; Showed No Growth -Patient was on IV Rocephin but will stop today   Iron deficiency anemia secondary to DOB and fibroids, normocytic -Hold off on iron supplementation due to severe constipation -H&H currently stable, monitor closely, baseline hemoglobin around 8-9 and is Stable -Watch for bleeding while on Ketorolac -Hb/Hct this AM was stable at 10.7/34.8 -Follow up with Outpatient GYN for suspected Vaginal Bleeding from Fibroids   Chronic narcotic use with chronic pain syndrome -Patient reports that she follows pain clinic and will need to follow up at D/C as she will be given NO SCRIPTS For OPIATES AT DISCHARGE.  -See Discussion as  above   Hypokalemia -K+ was 3.4 this AM  -Maintain K+ >4.0 -Replete with 40 mEQ of KCl BID x2 -Continue to Monitor and Replete as Necessary -Repeat in AM   DVT prophylaxis: Enoxaparin 40 mg sq q24h (Patient has been refusing) Code Status: FULL CODE Family Communication: Discussed with the mother at length over the phone and Boyfriend at bedside Disposition Plan: Anticipate D/C in next 24-48 hours if Cleared by Surgery and GI  Consultants:   General Surgery  Gastroenterology    Procedures: None   Antimicrobials:  Anti-infectives (From admission, onward)   Start     Dose/Rate Route Frequency Ordered Stop   03/21/18 1600  rifaximin (XIFAXAN) tablet 200 mg     200 mg Oral 3 times daily 03/21/18 1327     03/19/18 2130  cefTRIAXone (ROCEPHIN) 1 g in sodium chloride 0.9 % 100 mL IVPB     1 g 200 mL/hr over 30 Minutes Intravenous Every 24 hours 03/19/18 2118       Subjective: Seen and examined and now having bowel movements but abdomen remains distended and painful to palpate. No CP or SOB. Feels like she is getting better but biggest complaint is Abdominal Pain.   Objective: Vitals:   03/23/18 0434 03/24/18 0440 03/24/18 0844 03/24/18 1246  BP: 119/69 130/80 (!) 147/80 133/87  Pulse: 60 60 (!) 59 (!) 58  Resp: 16 17 12  18  Temp: 98.6 F (37 C) 98.1 F (36.7 C) 98.2 F (36.8 C) 98.6 F (37 C)  TempSrc: Oral Oral Oral Oral  SpO2: 100% 100% 100% 97%  Weight:      Height:        Intake/Output Summary (Last 24 hours) at 03/24/2018 1336 Last data filed at 03/23/2018 1500 Gross per 24 hour  Intake 120 ml  Output -  Net 120 ml   Filed Weights   03/19/18 1223  Weight: 69.4 kg (153 lb)   Examination: Physical Exam:  Constitutional: WN/WD AAF in NAD but complaining of uncontrolled pain; Seen ambulating the halls talking on the phone Eyes: Sclerae anicteric; Lids normal ENMT: External Ears and Nose appear normal Neck: Supple with no JVD Respiratory: CTAB; Unlabored  breathing Cardiovascular: RRR; no LE Edema Abdomen: Soft, Distended with mild tenderness. Bowel sounds present  GU: Deferred Musculoskeletal: No rashes or lesions on a limited skin evaluation Skin: Warm and dry; No appreciable rashes or lesions on a limited skin evaluation Neurologic: CN 2-12 grossly intact. No appreciable focal deficits Psychiatric: Combative and belligerent mood and flat affect.  Data Reviewed: I have personally reviewed following labs and imaging studies  CBC: Recent Labs  Lab 03/19/18 1230 03/20/18 0504 03/21/18 0634 03/23/18 0605 03/24/18 0546  WBC 11.6* 8.7 6.2 5.8 5.7  NEUTROABS  --  5.7  --   --  2.9  HGB 11.9* 10.6* 9.8* 9.9* 10.7*  HCT 37.4 33.3* 31.6* 31.8* 34.8*  MCV 87.0 87.6 88.5 89.3 88.3  PLT 295 274 256 264 706   Basic Metabolic Panel: Recent Labs  Lab 03/19/18 1230 03/20/18 0504 03/21/18 0634 03/23/18 0605 03/24/18 0546  NA 135 136 137 139 136  K 3.6 3.5 3.8 3.6 3.4*  CL 104 105 105 109 104  CO2 20* 22 22 24 23   GLUCOSE 111* 94 91 88 94  BUN 5* 8 7 <5* <5*  CREATININE 0.63 0.69 0.62 0.63 0.60  CALCIUM 9.1 8.6* 8.6* 8.4* 8.9  MG  --  2.0  --  1.7 2.3  PHOS  --   --   --  3.9 3.8   GFR: Estimated Creatinine Clearance: 88.5 mL/min (by C-G formula based on SCr of 0.6 mg/dL). Liver Function Tests: Recent Labs  Lab 03/19/18 1230 03/20/18 0504 03/21/18 0634 03/24/18 0546  AST 12* 9* 9* 16  ALT 14 11* 10* 14  ALKPHOS 79 61 56 64  BILITOT 0.7 0.6 0.3 0.2*  PROT 7.0 6.2* 6.1* 7.1  ALBUMIN 3.7 3.1* 3.0* 3.5   Recent Labs  Lab 03/19/18 1230  LIPASE 16   No results for input(s): AMMONIA in the last 168 hours. Coagulation Profile: No results for input(s): INR, PROTIME in the last 168 hours. Cardiac Enzymes: No results for input(s): CKTOTAL, CKMB, CKMBINDEX, TROPONINI in the last 168 hours. BNP (last 3 results) No results for input(s): PROBNP in the last 8760 hours. HbA1C: No results for input(s): HGBA1C in the last 72  hours. CBG: No results for input(s): GLUCAP in the last 168 hours. Lipid Profile: No results for input(s): CHOL, HDL, LDLCALC, TRIG, CHOLHDL, LDLDIRECT in the last 72 hours. Thyroid Function Tests: Recent Labs    03/22/18 0325  TSH 2.641   Anemia Panel: No results for input(s): VITAMINB12, FOLATE, FERRITIN, TIBC, IRON, RETICCTPCT in the last 72 hours. Sepsis Labs: No results for input(s): PROCALCITON, LATICACIDVEN in the last 168 hours.  Recent Results (from the past 240 hour(s))  Urine Culture     Status:  None   Collection Time: 03/23/18 10:44 AM  Result Value Ref Range Status   Specimen Description URINE, CLEAN CATCH  Final   Special Requests NONE  Final   Culture   Final    NO GROWTH Performed at [facility], [address], [place], [place] [address]    Report Status 03/24/2018 FINAL  Final     Radiology Studies: Dg Abd 1 View  Result Date: 03/24/2018 CLINICAL DATA:  42 year old female with abdominal distention. History of small bowel obstructions, and suspicion of partial small bowel obstruction on recent CT, status post oral contrast administration on 03/22/2018. EXAM: ABDOMEN - 1 VIEW COMPARISON:  03/23/2018 and earlier. FINDINGS: The previously diminished oral contrast is not present in the ascending colon. The bowel gas pattern remains stable since the CT on 03/19/2018 with maximal to mildly dilated gas-filled mid abdominal small bowel. Negative lung bases. Scoliosis. No acute osseous abnormality identified. IMPRESSION: The bowel gas pattern remains stable since the CT on 03/19/2018, with maximal to mildly dilated mid abdominal small bowel. The most recently administered oral contrast is in the ascending colon. Consider continued partial small bowel obstruction or ileus. Electronically Signed   By: Genevie Ann M.D.   On: 03/24/2018 08:35   Dg Abd Portable 1v-small Bowel Obstruction Protocol-initial, 8 Hr Delay  Result Date: 03/23/2018 CLINICAL DATA:  42 year old female  with small bowel obstruction. 8 hour delayed image following oral contrast administration. EXAM: PORTABLE ABDOMEN - 1 VIEW COMPARISON:  03/22/2018.  CT Abdomen and Pelvis 03/19/2018. FINDINGS: AP portable supine views at 0410 hours. There is no oral contrast present on the exam yesterday. These images demonstrate oral contrast in 2 different forms in the abdomen: There is flocculated contrast in the distal stomach and left abdominal small bowel loops, and there is more confluent oral contrast present in the large bowel to the level of the sigmoid colon. The bowel gas pattern remains stable since 03/19/2018. No pneumoperitoneum is evident. Stable lung bases with mild atelectasis. Scoliosis. No acute osseous abnormality identified. IMPRESSION: 1. Oral contrast has reached the distal large bowel suggesting partial if any small-bowel obstruction. 2. There is also residual desiccated contrast in the stomach and proximal small bowel loops. 3. Bowel-gas pattern remains stable since the CT on 03/19/2018. Electronically Signed   By: Genevie Ann M.D.   On: 03/23/2018 07:13   Scheduled Meds: . acetaminophen  1,000 mg Oral Q6H  . enoxaparin (LOVENOX) injection  40 mg Subcutaneous Q24H  . feeding supplement (ENSURE ENLIVE)  237 mL Oral BID BM  . ketorolac  15 mg Intravenous Q8H  . lidocaine  1 application Other Once  . linaclotide  290 mcg Oral QAC breakfast  . naloxegol oxalate  25 mg Oral Daily  . polyethylene glycol  17 g Oral BID  . potassium chloride  40 mEq Oral BID  . rifaximin  200 mg Oral TID  . saccharomyces boulardii  250 mg Oral BID  . senna-docusate  1 tablet Oral BID   Continuous Infusions: . sodium chloride 100 mL/hr at 03/22/18 1709  . cefTRIAXone (ROCEPHIN)  IV Stopped (03/23/18 2209)    LOS: 5 days    Total Time Spent: [address]  Kerney Elbe, [place] [facility] Pager [phone]  If 7PM-7AM, please contact night-coverage [email or web address] Password [facility] 03/24/2018, 1:36 PM

## 2018-03-24 NOTE — Progress Notes (Signed)
Pt seen and examined by Dr. Michail Sermon from GI, Pt's boyfriend at bedside and mother on speaker phone. Pt stated her pain is still a 9 and is uncontrolled by PO meds, insisting that IV pain meds do not interfere with her bowels. Pt refused miralax, stated she's been having watery stools but flushes them before being checked by staff. Pt aware of the changes made in her meds. Pt tolerating soft diet well. MD repeatedly explained effects of narcotics on gut motility. Pt's mother stated she wants her daughter to receive medication and it does not have to be IV dilaudid to make her daughter comfortable and to be able to control pain.

## 2018-03-24 NOTE — Progress Notes (Signed)
RN came in to pt's room to give her pain medicine. Pt became upset about changing her pain medicine and telling this RN that nursing staffs are not giving her pain medicine when it's due. Explained to the pt that she has been receiving her pain medicine even earlier on due time and whenever she asked for it and it's documented on the computer. Pt acting out in present of this RN, boyfriend and a friend in the room, talking about lawyer, filing and suing everyone who's taking care of her. Explained to the pt the plan of care for her but pt not receptive to it.

## 2018-03-24 NOTE — Progress Notes (Signed)
Explained again to Pt not to flush whenever she has BM for staff to check and document. Pt acknowledged and stated she understands.

## 2018-03-24 NOTE — Progress Notes (Signed)
No BM reported until 19:00, endorsed accordingly.

## 2018-03-24 NOTE — Progress Notes (Signed)
Mrs. Martinez seen this morning at the request of the patient for a second medical opinion and generally unhappy with her care. Chart, notes, imaging reviewed and discussed with the patient at bedside. She is a 42 yo F with history of chronic abdominal pain, recurrent SBOs, who was admitted to the hospital on 4/8 with pSBO, general surgery and GI were consulted. There appears to be consensus from Dr. Alfredia Ferguson, Dr. Cristina Gong (note dated 4/11) and Dr. Grandville Silos (note dated 4/11) that her chronic narcotic use complicates the picture and in fact may exacerbate her condition. She is able to tolerate food and currently is having BMs and clinically appears that her pSBO is improving. Her IV pain medications have been appropriately discontinued in this case as she is eating and I agree with Dr. Alfredia Ferguson in regards to using only po narcotics. General surgery to see again today  Selmer Adduci M. Cruzita Lederer, MD, PhD [facility] - Weekend director on call [phone]  If 7PM-7AM, please contact night-coverage [email or web address] Password [email or web address]

## 2018-03-25 ENCOUNTER — Inpatient Hospital Stay (HOSPITAL_COMMUNITY): Payer: Medicaid Other

## 2018-03-25 LAB — CBC WITH DIFFERENTIAL/PLATELET
Basophils Absolute: 0 10*3/uL (ref 0.0–0.1)
Basophils Relative: 0 %
Eosinophils Absolute: 0.1 10*3/uL (ref 0.0–0.7)
Eosinophils Relative: 3 %
HCT: 30.7 % — ABNORMAL LOW (ref 36.0–46.0)
Hemoglobin: 9.5 g/dL — ABNORMAL LOW (ref 12.0–15.0)
Lymphocytes Relative: 38 %
Lymphs Abs: 2 10*3/uL (ref 0.7–4.0)
MCH: 27.1 pg (ref 26.0–34.0)
MCHC: 30.9 g/dL (ref 30.0–36.0)
MCV: 87.5 fL (ref 78.0–100.0)
Monocytes Absolute: 0.4 10*3/uL (ref 0.1–1.0)
Monocytes Relative: 7 %
Neutro Abs: 2.7 10*3/uL (ref 1.7–7.7)
Neutrophils Relative %: 52 %
Platelets: 247 10*3/uL (ref 150–400)
RBC: 3.51 MIL/uL — ABNORMAL LOW (ref 3.87–5.11)
RDW: 20.4 % — ABNORMAL HIGH (ref 11.5–15.5)
WBC: 5.3 10*3/uL (ref 4.0–10.5)

## 2018-03-25 LAB — COMPREHENSIVE METABOLIC PANEL
ALT: 10 U/L — ABNORMAL LOW (ref 14–54)
AST: 10 U/L — ABNORMAL LOW (ref 15–41)
Albumin: 3 g/dL — ABNORMAL LOW (ref 3.5–5.0)
Alkaline Phosphatase: 52 U/L (ref 38–126)
Anion gap: 8 (ref 5–15)
BUN: <5 mg/dL — ABNORMAL LOW (ref 6–20)
CO2: 24 mmol/L (ref 22–32)
Calcium: 8.6 mg/dL — ABNORMAL LOW (ref 8.9–10.3)
Chloride: 107 mmol/L (ref 101–111)
Creatinine, Ser: 0.6 mg/dL (ref 0.44–1.00)
GFR calc Af Amer: >60 mL/min (ref >60)
GFR calc non Af Amer: >60 mL/min (ref >60)
Glucose, Bld: 92 mg/dL (ref 65–99)
Potassium: 3.6 mmol/L (ref 3.5–5.1)
Sodium: 139 mmol/L (ref 135–145)
Total Bilirubin: 0.2 mg/dL — ABNORMAL LOW (ref 0.3–1.2)
Total Protein: 5.9 g/dL — ABNORMAL LOW (ref 6.5–8.1)

## 2018-03-25 LAB — PHOSPHORUS: Phosphorus: 3.6 mg/dL (ref 2.5–4.6)

## 2018-03-25 LAB — MAGNESIUM: Magnesium: 1.7 mg/dL (ref 1.7–2.4)

## 2018-03-25 MED ORDER — HYDROCORTISONE 1 % EX CREA: TOPICAL_CREAM | CUTANEOUS | 0 refills | Status: DC | PRN

## 2018-03-25 MED ORDER — ENSURE ENLIVE PO LIQD: 237.0000 mL | Freq: Two times a day (BID) | ORAL | 12 refills | Status: DC

## 2018-03-25 MED ORDER — SENNOSIDES-DOCUSATE SODIUM 8.6-50 MG PO TABS: 1.0000 | ORAL_TABLET | Freq: Two times a day (BID) | ORAL | 0 refills | Status: DC

## 2018-03-25 MED ORDER — LINACLOTIDE 290 MCG PO CAPS: 290.0000 ug | ORAL_CAPSULE | Freq: Every day | ORAL | 0 refills | Status: DC

## 2018-03-25 MED ORDER — NALOXEGOL OXALATE 25 MG PO TABS: 25.0000 mg | ORAL_TABLET | Freq: Every day | ORAL | 0 refills | Status: DC

## 2018-03-25 MED ORDER — POLYETHYLENE GLYCOL 3350 17 G PO PACK: 17.0000 g | PACK | Freq: Two times a day (BID) | ORAL | 0 refills | Status: DC

## 2018-03-25 MED ORDER — ACETAMINOPHEN 325 MG PO TABS: 650.0000 mg | ORAL_TABLET | Freq: Four times a day (QID) | ORAL | 0 refills | Status: DC | PRN

## 2018-03-25 MED ORDER — ONDANSETRON HCL 4 MG PO TABS: 4.0000 mg | ORAL_TABLET | Freq: Four times a day (QID) | ORAL | 0 refills | Status: DC | PRN

## 2018-03-25 MED ORDER — RIFAXIMIN 200 MG PO TABS: 200.0000 mg | ORAL_TABLET | Freq: Three times a day (TID) | ORAL | 0 refills | Status: DC

## 2018-03-25 MED ORDER — POTASSIUM CHLORIDE 20 MEQ/15ML (10%) PO SOLN
40.0000 meq | Freq: Two times a day (BID) | ORAL | Status: DC
  Administered 2018-03-25: 40 meq via ORAL
  Filled 2018-03-25: qty 30

## 2018-03-25 NOTE — Progress Notes (Signed)
Removed IV, provided discharge education/instructions, all questions and concerns addressed, Pt no in distress, Pt to discharge home with belongings accompanied by her boyfriend.

## 2018-03-25 NOTE — Progress Notes (Signed)
No BM reported from 1900 till 0700 shift.

## 2018-03-25 NOTE — Progress Notes (Signed)
Patient ID: [name], female   DOB: [birth year], 42 y.o.   MRN: [id number]       Subjective: Patient complains of some abdominal pain, but she is eating solid diet with no nausea.  She is passing flatus and having BMs.  Objective: Vital signs in last 24 hours: Temp:  [98.3 F (36.8 C)-98.6 F (37 C)] 98.3 F (36.8 C) (04/14 0451) Pulse Rate:  [53-62] 62 (04/14 0451) Resp:  [16-18] 16 (04/14 0451) BP: (133-150)/(81-87) 145/83 (04/14 0451) SpO2:  [97 %-100 %] 100 % (04/14 0451) Last BM Date: 03/23/18  Intake/Output from previous day: No intake/output data recorded. Intake/Output this shift: No intake/output data recorded.  PE: Abd: soft, with some distention (she states is chronic), +BS, minimally tender Heart: regular Lungs: CTAB  Lab Results:  Recent Labs    03/24/18 0546 03/25/18 0657  WBC 5.7 5.3  HGB 10.7* 9.5*  HCT 34.8* 30.7*  PLT 294 247   BMET Recent Labs    03/24/18 0546 03/25/18 0657  NA 136 139  K 3.4* 3.6  CL 104 107  CO2 23 24  GLUCOSE 94 92  BUN <5* <5*  CREATININE 0.60 0.60  CALCIUM 8.9 8.6*   PT/INR No results for input(s): LABPROT, INR in the last 72 hours. CMP     Component Value Date/Time   NA 139 03/25/2018 0657   K 3.6 03/25/2018 0657   CL 107 03/25/2018 0657   CO2 24 03/25/2018 0657   GLUCOSE 92 03/25/2018 0657   BUN <5 (L) 03/25/2018 0657   CREATININE 0.60 03/25/2018 0657   CALCIUM 8.6 (L) 03/25/2018 0657   PROT 5.9 (L) 03/25/2018 0657   ALBUMIN 3.0 (L) 03/25/2018 0657   AST 10 (L) 03/25/2018 0657   ALT 10 (L) 03/25/2018 0657   ALKPHOS 52 03/25/2018 0657   BILITOT 0.2 (L) 03/25/2018 0657   GFRNONAA >60 03/25/2018 0657   GFRAA >60 03/25/2018 0657   Lipase     Component Value Date/Time   LIPASE 16 03/19/2018 1230       Studies/Results: Dg Abd 1 View  Result Date: 03/25/2018 CLINICAL DATA:  Abdominal distension EXAM: ABDOMEN - 1 VIEW COMPARISON:  Plain film of the abdomen dated 03/24/2017. FINDINGS: Mildly  distended small bowel loops are again seen within the central abdomen and LEFT lower quadrant. Air is seen throughout the normal caliber colon. No evidence of soft tissue mass or abnormal fluid collection. No evidence of free intraperitoneal air. Lung bases are clear IMPRESSION: Mildly distended gas-filled loops of small bowel again seen within the central abdomen and LEFT lower quadrant, not significantly changed compared to yesterday's plain film exam, compatible with the small-bowel dilatation seen on earlier CT of 03/19/2018, suggesting continued partial small bowel obstruction or ileus. Electronically Signed   By: Franki Cabot M.D.   On: 03/25/2018 08:41   Dg Abd 1 View  Result Date: 03/24/2018 CLINICAL DATA:  42 year old female with abdominal distention. History of small bowel obstructions, and suspicion of partial small bowel obstruction on recent CT, status post oral contrast administration on 03/22/2018. EXAM: ABDOMEN - 1 VIEW COMPARISON:  03/23/2018 and earlier. FINDINGS: The previously diminished oral contrast is not present in the ascending colon. The bowel gas pattern remains stable since the CT on 03/19/2018 with maximal to mildly dilated gas-filled mid abdominal small bowel. Negative lung bases. Scoliosis. No acute osseous abnormality identified. IMPRESSION: The bowel gas pattern remains stable since the CT on 03/19/2018, with maximal to mildly dilated mid abdominal  small bowel. The most recently administered oral contrast is in the ascending colon. Consider continued partial small bowel obstruction or ileus. Electronically Signed   By: Genevie Ann M.D.   On: 03/24/2018 08:35    Anti-infectives: Anti-infectives (From admission, onward)   Start     Dose/Rate Route Frequency Ordered Stop   03/21/18 1600  rifaximin (XIFAXAN) tablet 200 mg     200 mg Oral 3 times daily 03/21/18 1327     03/19/18 2130  cefTRIAXone (ROCEPHIN) 1 g in sodium chloride 0.9 % 100 mL IVPB  Status:  Discontinued     1  g 200 mL/hr over 30 Minutes Intravenous Every 24 hours 03/19/18 2118 03/24/18 1352       Assessment/Plan PSBO/bowel dysmotility -patient has chronic pain issues with chronic narcotic use.  She is adamant that narcotics do not cause her constipation or slow her guy down.  She is refusing the motility agents that GI wrote for.  I explained to her that given she is tolerating a soft diet with no nausea, passing flatus and having BMs that no surgical intervention is necessary.  She understands this and has little else to say or ask. No further plans from surgical standpoint.  We will sign off.   LOS: 6 days    Henreitta Cea , [facility] 03/25/2018, 10:00 AM Pager: [phone]

## 2018-03-25 NOTE — Discharge Summary (Signed)
Physician Discharge Summary  [name]:[id number] DOB: 1976/02/23 DOA: 03/19/2018  PCP: Beaulah Corin, [facility]  Admit date: 03/19/2018 Discharge date: 03/25/2018  Admitted From: Home Disposition: Home  Recommendations for Outpatient Follow-up:  1. Follow up with PCP in 1-2 weeks 2. Follow up with Gastroenterology Dr. Cristina Gong in 1-2 weeks 3. Follow up with General Surgery Dr. Grandville Silos in 1-2 weeks 4. Follow up with Pain Specialist for adjustment of medications 5. Follow up with Gynecology for Dysfunctional Uterine Bleeding  6. Please obtain CMP/CBC, Mag, Phos in one week 7. Please follow up on the following pending results:  Home Health: No Equipment/Devices: None   Discharge Condition: Stable CODE STATUS: FULL CODE Diet recommendation: Soft Low Residue Diet  Brief/Interim Summary: Patient is a 42 year old female with chronic pain, narcotic dependency, DUB attributed to fibroids, chronic iron deficiency anemia presented with abdominal distention and pain over the past few days. She reported nausea without vomiting, last BM 6 days prior to admission despite using MiraLAX and amitiza at home. Reported ongoing vaginal bleeding for which she follows with OB/GYN. In ED CT of the abdomen and pelvis showed possible developing or partial SBO with mild colitis in the right and proximal transverse colon, persistent fluid in the pelvis. EDP discussed with surgery who reviewed CT scans and felt likely opioid induced constipation and recommended aggressive bowel regimen and advised against any surgical intervention.   Patient was admitted for further workup and continued to have intractable Abdominal Pain and has some distention as well. GI and Surgery were both on the case. Now reportedly having Bowel movements after several motility agents added. Patient's nurse stating that she has been flushing bowel movements prior to examination and she did again last night.    Yesterday she was seen  ambulating around the unit in NAD talking on the phone with her mother but still requesting IV Narcotics. I discontinued them that AM and adjusted her pain regimen as she is not Nauseous or vomiting and is continuing to have bowel movements. Asked GI and Surgery to weigh in again and patient was not in room when Surgery went to evaluate her but GI evaluated they agree that Opioids will worsen her pSBO. Patient unhappy with care and asked for a second hospitalist opinion and my Director on Call Dr. Cruzita Lederer came to evaluate and is in agreement with my medical decision making. Family upset and acting belligerent on Phone and requested to speak to the [facility] who feels Ive done everything appropriately.   Patient continues to complain that she needs IV Narcotics for Pain but was seen and examined this AM in bed and was in NAD and awoken from sleep. She still stated Pain was 9/10 but on abdominal examination it was soft, slightly distended, and had mild pain. Bowel sounds were present. Patient admitting to having Flatus and having bowel movements and tolerating food without any nausea. General Surgery evaluated and felt she had no surgical needs. Upon my review of patient's KUB this AM it appears slightly better. Patient also has been refusing her bowel regimen prescribed to her and is generally unhappy with the care she has received. She was deemed medically stable to D/C Home as she appears stable and she will need to follow up with PCP, Gastroenterology, General Surgery, Gynecology, and follow up with a Pain Specialist.   Discharge Diagnoses:  Principal Problem:   Partial small bowel obstruction ([place]) Active Problems:   Abdominal pain in female   Constipation due to opioid therapy  Acute lower UTI   Dysfunctional uterine bleeding   Chronically on opiate therapy  Partial small bowel obstruction ([facility]) -Appears Secondary to chronic narcotic dependency -CT abdomen and pelvis suggested partial/early SBO with  mild colitis but doubt colitis  -Patient was started on bowel regimen, IV fluid hydration, and is having bowel movements -GI was consulted, seen by Dr. Cristina Gong saw patient (4/11), also felt partial SBO from opioid induced intestinal dysmotility versuslow probability of adhesions causing SBO.Per GI, questionable proximal colitis on admission CT likely artifactual; GI signed off but asked to re-evaluate and Dr. Michail Sermon feels that this is opiate induced as well and agrees with minimizing narcotics and feels  -Currently no fevers or leukocytosis -Patient and family continue to not understand that Opiates slowing down the gut motility, worsening distention and pain after several lengthy  discussions  -During Hospitalization has had Lactulose, smogenema, continue linzess,MiraLAX twice a day, Senna-Docusate but now is refusing Miralax and Senna-Docuasate -Patient's mother belligerent over the phone and even cursing at me. Explained in great depth to the patient's mother regarding the rationale behind stopping IV Narcotics as it will worsen the ileus/SBO by delaying the gut motility and feel that since she is taking in po without issues and having bowel movements IV Narcotics are not indicated.  -Patient has a history of recurrent SBO with previous surgeries. Patient wanted a second Surgical Opinion so Surgery was re-consultedand they reviewed imaging and was re-assuring and have nothing else to offer as she is tolerating soft diet without any nausea, passing flatus and having bowel movements  -Patient's mother was adamant and feels that Narcotics are not slowing down her daughters gut and that she is having bowel movements despite Narcotics (she is on Multiple Bowel stimulants); See discussion below  -Diet was advanced to Soft Diet and patient tolerating with no Nausea or Vomiting and appeared comfortable today laying in bed in NAD -Keep Mag >2.0 and K+ >4.0 and replete as necessary  -Repeat Abdominal KUB  this AM Mildly distended small bowel loops are again seen within the central abdomen and LEFT lower quadrant. Air is seen throughout the normal caliber colon. No evidence of soft tissue mass or abnormal fluidcollection. No evidence of free intraperitoneal air. Lung bases are clear -Will not escalate to IV Narcotics and stopped IV Dilaudid; Will NOT GIVE Narcotic Prescriptions at Discharge because patient sees a Pain Clinic and will need to follow up with them -Patient even stated she improved from when coming in but continues to complain of chronic pain.  -Deemed stable to D/C Home and will need to follow up with PCP, Gastroenterology, General Surgery and Pain Clinic   Intractable Abdominal Pain and Distention -Complaining of a 9/10 in severity still but was seen ambulating the halls on phone in No Distress and is tolerating diet with no Nausea and having Bowel movements -Patient and family upset still and wanting second opinions -Mother was cursing at me over the Phone yesterday and being belligerent and patient not understanding; Per mother's report she was crying to her mother over the phone being in pain but Staff relates no such incidents  -Changed pain regimen to Acetaminophen 1000 mg po q6h, Oxycodone 5-10 mg po q4hprn, and IV Ketorolac yesterday and tolerated that well but continued to complain of pain when she was in NAD and appeared comfortable  -Dilaudid 1 mg q4h stopped given that patient is able to tolerate po, Having bowel movements, and ambulating the halls; There is no Indication for IV Narcotics  this time and always has chronic pain and will nor restart -Repeat KUB this AM showed Mildly distended small bowel loops are again seen within the central abdomen and LEFT lower quadrant. Air is seen throughout the normal caliber colon. No evidence of soft tissue mass or abnormal fluid collection. No evidence of free intraperitoneal air. Lung bases are clear  UTI -Urine culture was ordered  4/8 still not collected until today; Showed No Growth -Patient was on IV Rocephin but stopped after completion of 5 days.   Iron deficiency anemia secondary to DOB and fibroids, normocytic -Hold off on iron supplementation due to severe constipation and resume as an outpatient  -H&H currently stable, monitor closely, baseline hemoglobin around 8-9 and is Stable -Watch for bleeding while on Ketorolac -Hb/Hct this AM was stable at 10.7/34.8 -> 9.5/30.7 -Follow up with Outpatient GYN for suspected Vaginal Bleeding from Fibroids  -Repeat CBC as an outpatient    Chronic narcotic use with chronic pain syndrome -Patient reports that she follows pain clinic and will need to follow up at D/C as she will be given NO SCRIPTS For OPIATES AT DISCHARGE.  -See Discussion as above   Hypokalemia -K+ was 3.6 this AM  -Maintain K+ >4.0 -Replete with 40 mEQ of KCl BID x2 again today  -Continue to Monitor and Replete as Necessary -Repeat in AM   Discharge Instructions  Discharge Instructions    Call MD for:  difficulty breathing, headache or visual disturbances   Complete by:  As directed    Call MD for:  extreme fatigue   Complete by:  As directed    Call MD for:  persistant dizziness or light-headedness   Complete by:  As directed    Call MD for:  persistant nausea and vomiting   Complete by:  As directed    Call MD for:  redness, tenderness, or signs of infection (pain, swelling, redness, odor or green/yellow discharge around incision site)   Complete by:  As directed    Call MD for:  severe uncontrolled pain   Complete by:  As directed    Call MD for:  temperature >100.4   Complete by:  As directed    Diet - low sodium heart healthy   Complete by:  As directed    SOFT DIET   Discharge instructions   Complete by:  As directed    Follow up with PCP, General Surgery, and Gastroenterology as an outpatient. Take all medications as prescribed and limit narcotic usage. Talk with Pain  Specialist about Chronic Pain. If symptoms change or worsen please return to the ED for evaluation.   Increase activity slowly   Complete by:  As directed      Allergies as of 03/25/2018   No Known Allergies     Medication List    STOP taking these medications   doxycycline 100 MG tablet Commonly known as:  VIBRA-TABS   lubiprostone 24 MCG capsule Commonly known as:  AMITIZA     TAKE these medications   acetaminophen 325 MG tablet Commonly known as:  TYLENOL Take 2 tablets (650 mg total) by mouth every 6 (six) hours as needed for mild pain or moderate pain (or Fever >/= 101).   ADVIL PM PO Take 1 tablet by mouth once.   feeding supplement (ENSURE ENLIVE) Liqd Take 237 mLs by mouth 2 (two) times daily between meals.   ferrous sulfate 325 (65 FE) MG tablet Take 1 tablet (325 mg total) by mouth 2 (two)  times daily with a meal.   Heating Pad Dry Heat Pads Use as directed   hydrocortisone cream 1 % Apply topically as needed for itching.   linaclotide 290 MCG Caps capsule Commonly known as:  LINZESS Take 1 capsule (290 mcg total) by mouth daily before breakfast. Start taking on:  03/26/2018   naloxegol oxalate 25 MG Tabs tablet Commonly known as:  MOVANTIK Take 1 tablet (25 mg total) by mouth daily. Start taking on:  03/26/2018   ondansetron 4 MG tablet Commonly known as:  ZOFRAN Take 1 tablet (4 mg total) by mouth every 6 (six) hours as needed for nausea.   polyethylene glycol packet Commonly known as:  MIRALAX / GLYCOLAX Take 17 g by mouth 2 (two) times daily.   rifaximin 200 MG tablet Commonly known as:  XIFAXAN Take 1 tablet (200 mg total) by mouth 3 (three) times daily.   senna-docusate 8.6-50 MG tablet Commonly known as:  Senokot-S Take 1 tablet by mouth 2 (two) times daily.      Follow-up Information    [facility], [facility]. Call.   Specialty:  Internal Medicine Why:  Follow up within 1 week Contact information: [place]  [address] [phone]        Georganna Skeans, MD. Call.   Specialty:  General Surgery Why:  Follow Up within 1 week Contact information: [place] [address] [phone]        Ronald Lobo, MD. Call.   Specialty:  Gastroenterology Why:  Follow up in 1-2 weeks Contact information: [address] [place] [address] [phone]          No Known Allergies  Consultations:  General Surgery  Gastroenterology  Procedures/Studies: Dg Abd 1 View  Result Date: 03/25/2018 CLINICAL DATA:  Abdominal distension EXAM: ABDOMEN - 1 VIEW COMPARISON:  Plain film of the abdomen dated 03/24/2017. FINDINGS: Mildly distended small bowel loops are again seen within the central abdomen and LEFT lower quadrant. Air is seen throughout the normal caliber colon. No evidence of soft tissue mass or abnormal fluid collection. No evidence of free intraperitoneal air. Lung bases are clear IMPRESSION: Mildly distended gas-filled loops of small bowel again seen within the central abdomen and LEFT lower quadrant, not significantly changed compared to yesterday's plain film exam, compatible with the small-bowel dilatation seen on earlier CT of 03/19/2018, suggesting continued partial small bowel obstruction or ileus. Electronically Signed   By: Franki Cabot M.D.   On: 03/25/2018 08:41   Dg Abd 1 View  Result Date: 03/24/2018 CLINICAL DATA:  42 year old female with abdominal distention. History of small bowel obstructions, and suspicion of partial small bowel obstruction on recent CT, status post oral contrast administration on 03/22/2018. EXAM: ABDOMEN - 1 VIEW COMPARISON:  03/23/2018 and earlier. FINDINGS: The previously diminished oral contrast is not present in the ascending colon. The bowel gas pattern remains stable since the CT on 03/19/2018 with maximal to mildly dilated gas-filled mid abdominal small bowel. Negative lung bases. Scoliosis. No acute osseous  abnormality identified. IMPRESSION: The bowel gas pattern remains stable since the CT on 03/19/2018, with maximal to mildly dilated mid abdominal small bowel. The most recently administered oral contrast is in the ascending colon. Consider continued partial small bowel obstruction or ileus. Electronically Signed   By: Genevie Ann M.D.   On: 03/24/2018 08:35   Ct Abdomen Pelvis W Contrast  Result Date: 03/19/2018 CLINICAL DATA:  Nausea abdominal pain EXAM:  CT ABDOMEN AND PELVIS WITH CONTRAST TECHNIQUE: Multidetector CT imaging of the abdomen and pelvis was performed using the standard protocol following bolus administration of intravenous contrast. CONTRAST:  131mL ISOVUE-300 IOPAMIDOL (ISOVUE-300) INJECTION 61% COMPARISON:  CT 02/03/2018, 02/01/2018, 03/08/2016 FINDINGS: Lower chest: Lung bases demonstrate hazy atelectasis. No pleural effusion or focal consolidation. Heart size upper normal. Hepatobiliary: No focal liver abnormality is seen. No gallstones, gallbladder wall thickening, or biliary dilatation. Pancreas: Unremarkable. No pancreatic ductal dilatation or surrounding inflammatory changes. Spleen: Normal in size without focal abnormality. Adrenals/Urinary Tract: Adrenal glands are within normal limits. Cyst in the midpole of the right kidney. Bladder unremarkable Stomach/Bowel: Stomach is nonenlarged. Fluid filled, slightly dilated central small bowel, measuring up to 3.3 cm with decompressed distal small bowel. Possible areas of colon wall thickening involving the ascending colon and hepatic flexure. Appendix upper normal in size with intraluminal radiopaque material but similar morphology compared to prior CT. Vascular/Lymphatic: Nonaneurysmal aorta. Stable subcentimeter retroperitoneal lymph nodes. Reproductive: Uterus unremarkable. 3 cm probable cyst in the right adnexa. Other: Negative for free air. Loculated ascites in the pelvis with less pronounced rim enhancement. Musculoskeletal: No acute or  suspicious abnormality. IMPRESSION: 1. Slightly enlarged fluid-filled central small bowel loops with decompressed distal small bowel, may reflect developing or partial small bowel obstruction. 2. Possible mild colitis type changes involving the right colon and proximal transverse colon 3. Slight increased loculated appearing ascites within the pelvis and right gutter, but with decreased peripheral/rim enhancement compared to prior CT. Electronically Signed   By: Donavan Foil M.D.   On: 03/19/2018 17:51   Dg Abd 2 Views  Result Date: 03/21/2018 CLINICAL DATA:  Follow up small bowel obstruction EXAM: ABDOMEN - 2 VIEW COMPARISON:  03/19/2018 FINDINGS: Scattered large and small bowel gas is noted. Persistent small bowel dilatation is noted with air-fluid levels. No free air is noted. No acute bony abnormality is seen. IMPRESSION: Persistent small bowel dilatation similar to that seen on prior CT examination. Electronically Signed   By: Inez Catalina M.D.   On: 03/21/2018 09:33   Dg Abd Portable 1v-small Bowel Obstruction Protocol-initial, 8 Hr Delay  Result Date: 03/23/2018 CLINICAL DATA:  42 year old female with small bowel obstruction. 8 hour delayed image following oral contrast administration. EXAM: PORTABLE ABDOMEN - 1 VIEW COMPARISON:  03/22/2018.  CT Abdomen and Pelvis 03/19/2018. FINDINGS: AP portable supine views at 0410 hours. There is no oral contrast present on the exam yesterday. These images demonstrate oral contrast in 2 different forms in the abdomen: There is flocculated contrast in the distal stomach and left abdominal small bowel loops, and there is more confluent oral contrast present in the large bowel to the level of the sigmoid colon. The bowel gas pattern remains stable since 03/19/2018. No pneumoperitoneum is evident. Stable lung bases with mild atelectasis. Scoliosis. No acute osseous abnormality identified. IMPRESSION: 1. Oral contrast has reached the distal large bowel suggesting  partial if any small-bowel obstruction. 2. There is also residual desiccated contrast in the stomach and proximal small bowel loops. 3. Bowel-gas pattern remains stable since the CT on 03/19/2018. Electronically Signed   By: Genevie Ann M.D.   On: 03/23/2018 07:13   Dg Abd Portable 1v  Result Date: 03/22/2018 CLINICAL DATA:  Small bowel obstruction. EXAM: PORTABLE ABDOMEN - 1 VIEW COMPARISON:  Radiographs yesterday.  CT 03/19/2018 FINDINGS: Similar bowel gas pattern to prior exam with prominent air-filled bowel in the central and upper abdomen, likely dilated small bowel. No evidence of free air.  Pelvic phleboliths. IMPRESSION: Unchanged bowel gas pattern. Persistent gaseous distention of bowel loops in the central and upper abdomen consistent with persistent small bowel obstruction. Electronically Signed   By: Jeb Levering M.D.   On: 03/22/2018 00:56     Subjective: Seen and examined this AM and she was awoken from sleep this AM. Still complained of severe pain but on deep palpation had minimal grimace and did not appear to be in Any Distress. States she had a bowel movement last night at midnight and is tolerating food without nausea. Also has been refusing Stool Softners and laxatives. Surgery evaluated and feel like no surgical intervention is necessary and that she is stable for D/C. She will need to follow up with Gastroenterology Dr. Cristina Gong in 1-2 weeks.  Discharge Exam: Vitals:   03/24/18 2058 03/25/18 0451  BP: (!) 150/81 (!) 145/83  Pulse: (!) 53 62  Resp: 18 16  Temp: 98.5 F (36.9 C) 98.3 F (36.8 C)  SpO2: 100% 100%   Vitals:   03/24/18 0844 03/24/18 1246 03/24/18 2058 03/25/18 0451  BP: (!) 147/80 133/87 (!) 150/81 (!) 145/83  Pulse: (!) 59 (!) 58 (!) 53 62  Resp: 12 18 18 16   Temp: 98.2 F (36.8 C) 98.6 F (37 C) 98.5 F (36.9 C) 98.3 F (36.8 C)  TempSrc: Oral Oral Oral Oral  SpO2: 100% 97% 100% 100%  Weight:      Height:       General: Pt awoken from sleep alert,  awake, not in acute distress Cardiovascular: RRR, S1/S2 +, no rubs, no gallops Respiratory: Slightly diminished bilaterally, no wheezing, no rhonchi Abdominal: Soft, Slight tenderness in mid abdomen, Mildly distended, bowel sounds + Extremities: no edema, no cyanosis; Has a scar on Right ankle  The results of significant diagnostics from this hospitalization (including imaging, microbiology, ancillary and laboratory) are listed below for reference.    Microbiology: Recent Results (from the past 240 hour(s))  Urine Culture     Status: None   Collection Time: 03/23/18 10:44 AM  Result Value Ref Range Status   Specimen Description URINE, CLEAN CATCH  Final   Special Requests NONE  Final   Culture   Final    NO GROWTH Performed at [facility], [address] [place], [place] [address]    Report Status 03/24/2018 FINAL  Final    Labs: BNP (last 3 results) No results for input(s): BNP in the last 8760 hours. Basic Metabolic Panel: Recent Labs  Lab 03/20/18 0504 03/21/18 0634 03/23/18 0605 03/24/18 0546 03/25/18 0657  NA 136 137 139 136 139  K 3.5 3.8 3.6 3.4* 3.6  CL 105 105 109 104 107  CO2 22 22 24 23 24   GLUCOSE 94 91 88 94 92  BUN 8 7 <5* <5* <5*  CREATININE 0.69 0.62 0.63 0.60 0.60  CALCIUM 8.6* 8.6* 8.4* 8.9 8.6*  MG 2.0  --  1.7 2.3 1.7  PHOS  --   --  3.9 3.8 3.6   Liver Function Tests: Recent Labs  Lab 03/19/18 1230 03/20/18 0504 03/21/18 0634 03/24/18 0546 03/25/18 0657  AST 12* 9* 9* 16 10*  ALT 14 11* 10* 14 10*  ALKPHOS 79 61 56 64 52  BILITOT 0.7 0.6 0.3 0.2* 0.2*  PROT 7.0 6.2* 6.1* 7.1 5.9*  ALBUMIN 3.7 3.1* 3.0* 3.5 3.0*   Recent Labs  Lab 03/19/18 1230  LIPASE 16   No results for input(s): AMMONIA in the last 168 hours.  CBC: Recent Labs  Lab 03/20/18 0504 03/21/18 0634 03/23/18 0605 03/24/18 0546 03/25/18 0657  WBC 8.7 6.2 5.8 5.7 5.3  NEUTROABS 5.7  --   --  2.9 2.7  HGB 10.6* 9.8* 9.9* 10.7* 9.5*  HCT 33.3* 31.6* 31.8*  34.8* 30.7*  MCV 87.6 88.5 89.3 88.3 87.5  PLT 274 256 264 294 247   Cardiac Enzymes: No results for input(s): CKTOTAL, CKMB, CKMBINDEX, TROPONINI in the last 168 hours. BNP: Invalid input(s): POCBNP CBG: No results for input(s): GLUCAP in the last 168 hours. D-Dimer No results for input(s): DDIMER in the last 72 hours. Hgb A1c No results for input(s): HGBA1C in the last 72 hours. Lipid Profile No results for input(s): CHOL, HDL, LDLCALC, TRIG, CHOLHDL, LDLDIRECT in the last 72 hours. Thyroid function studies No results for input(s): TSH, T4TOTAL, T3FREE, THYROIDAB in the last 72 hours.  Invalid input(s): FREET3 Anemia work up No results for input(s): VITAMINB12, FOLATE, FERRITIN, TIBC, IRON, RETICCTPCT in the last 72 hours. Urinalysis    Component Value Date/Time   COLORURINE RED (A) 03/19/2018 1611   APPEARANCEUR TURBID (A) 03/19/2018 1611   LABSPEC 1.026 03/19/2018 1611   PHURINE 5.0 03/19/2018 1611   GLUCOSEU NEGATIVE 03/19/2018 1611   HGBUR MODERATE (A) 03/19/2018 1611   BILIRUBINUR NEGATIVE 03/19/2018 1611   KETONESUR 5 (A) 03/19/2018 1611   PROTEINUR 30 (A) 03/19/2018 1611   UROBILINOGEN 0.2 08/25/2015 1220   NITRITE POSITIVE (A) 03/19/2018 1611   LEUKOCYTESUR NEGATIVE 03/19/2018 1611   Sepsis Labs Invalid input(s): PROCALCITONIN,  WBC,  LACTICIDVEN Microbiology Recent Results (from the past 240 hour(s))  Urine Culture     Status: None   Collection Time: 03/23/18 10:44 AM  Result Value Ref Range Status   Specimen Description URINE, CLEAN CATCH  Final   Special Requests NONE  Final   Culture   Final    NO GROWTH Performed at [facility], [place] [address], [place], [place] [address]    Report Status 03/24/2018 FINAL  Final   Time coordinating discharge: 35 minutes  SIGNED:  Kerney Elbe, DO [facility] 03/25/2018, 10:46 AM Pager [phone]  If 7PM-7AM, please contact night-coverage [email or web address] Password [email or web address]

## 2018-03-25 NOTE — Progress Notes (Signed)
Called by Pt to room, Pt had large BM, soft sausage-like, brown stools.

## 2018-03-25 NOTE — Progress Notes (Signed)
Pt refused stool softener and laxative. Pt educated but she stated these meds make her abdominal pain worse and she is not getting enough pain meds, said she is not constipated and reported she had BM last night but unchecked by staff. Reiterated to Pt that she needs to have staff check her BM and stated she understands. MD notified.

## 2018-04-11 ENCOUNTER — Other Ambulatory Visit: Payer: Self-pay | Admitting: General Surgery

## 2018-04-11 DIAGNOSIS — K5651 Intestinal adhesions [bands], with partial obstruction: Secondary | ICD-10-CM

## 2018-06-06 ENCOUNTER — Other Ambulatory Visit: Payer: Self-pay | Admitting: General Surgery

## 2018-06-06 ENCOUNTER — Inpatient Hospital Stay
Admission: RE | Admit: 2018-06-06 | Discharge: 2018-06-06 | Disposition: A | Discharge status: Home / Self Care | Payer: Medicaid Other | Source: Ambulatory Visit | Attending: General Surgery | Admitting: General Surgery

## 2018-06-06 ENCOUNTER — Ambulatory Visit
Admission: RE | Admit: 2018-06-06 | Discharge: 2018-06-06 | Disposition: A | Discharge status: Home / Self Care | Payer: Medicaid Other | Source: Ambulatory Visit | Attending: General Surgery | Admitting: General Surgery

## 2018-06-06 DIAGNOSIS — K5651 Intestinal adhesions [bands], with partial obstruction: Secondary | ICD-10-CM

## 2018-06-13 ENCOUNTER — Ambulatory Visit
Admission: RE | Admit: 2018-06-13 | Discharge: 2018-06-13 | Disposition: A | Discharge status: Home / Self Care | Payer: Medicaid Other | Source: Ambulatory Visit | Attending: General Surgery | Admitting: General Surgery

## 2018-06-13 DIAGNOSIS — K5651 Intestinal adhesions [bands], with partial obstruction: Secondary | ICD-10-CM

## 2018-06-20 ENCOUNTER — Other Ambulatory Visit: Payer: Medicaid Other

## 2019-10-17 ENCOUNTER — Other Ambulatory Visit: Payer: Self-pay

## 2019-10-17 ENCOUNTER — Encounter (HOSPITAL_COMMUNITY): Payer: Self-pay

## 2019-10-17 ENCOUNTER — Emergency Department (HOSPITAL_COMMUNITY): Payer: Medicaid Other

## 2019-10-17 ENCOUNTER — Emergency Department (HOSPITAL_COMMUNITY)
Admission: EM | Admit: 2019-10-17 | Discharge: 2019-10-18 | Disposition: A | Discharge status: Home / Self Care | Payer: Medicaid Other | Attending: Emergency Medicine | Admitting: Emergency Medicine

## 2019-10-17 DIAGNOSIS — R32 Unspecified urinary incontinence: Secondary | ICD-10-CM | POA: Diagnosis not present

## 2019-10-17 DIAGNOSIS — F17210 Nicotine dependence, cigarettes, uncomplicated: Secondary | ICD-10-CM | POA: Insufficient documentation

## 2019-10-17 DIAGNOSIS — R4182 Altered mental status, unspecified: Secondary | ICD-10-CM | POA: Diagnosis present

## 2019-10-17 DIAGNOSIS — I10 Essential (primary) hypertension: Secondary | ICD-10-CM | POA: Diagnosis not present

## 2019-10-17 DIAGNOSIS — Z79899 Other long term (current) drug therapy: Secondary | ICD-10-CM | POA: Diagnosis not present

## 2019-10-17 DIAGNOSIS — F1310 Sedative, hypnotic or anxiolytic abuse, uncomplicated: Secondary | ICD-10-CM | POA: Diagnosis not present

## 2019-10-17 DIAGNOSIS — R404 Transient alteration of awareness: Secondary | ICD-10-CM

## 2019-10-17 LAB — URINALYSIS, ROUTINE W REFLEX MICROSCOPIC
Bilirubin Urine: NEGATIVE
Glucose, UA: NEGATIVE mg/dL
Ketones, ur: NEGATIVE mg/dL
Leukocytes,Ua: NEGATIVE
Nitrite: POSITIVE — AB
Protein, ur: NEGATIVE mg/dL
Specific Gravity, Urine: 1.004 — ABNORMAL LOW (ref 1.005–1.030)
pH: 7 (ref 5.0–8.0)

## 2019-10-17 LAB — CBC
HCT: 32.2 % — ABNORMAL LOW (ref 36.0–46.0)
Hemoglobin: 9.3 g/dL — ABNORMAL LOW (ref 12.0–15.0)
MCH: 25.8 pg — ABNORMAL LOW (ref 26.0–34.0)
MCHC: 28.9 g/dL — ABNORMAL LOW (ref 30.0–36.0)
MCV: 89.4 fL (ref 80.0–100.0)
Platelets: 122 10*3/uL — ABNORMAL LOW (ref 150–400)
RBC: 3.6 MIL/uL — ABNORMAL LOW (ref 3.87–5.11)
RDW: 16.9 % — ABNORMAL HIGH (ref 11.5–15.5)
WBC: 7.8 10*3/uL (ref 4.0–10.5)
nRBC: 0 % (ref 0.0–0.2)

## 2019-10-17 LAB — COMPREHENSIVE METABOLIC PANEL
ALT: 12 U/L (ref 0–44)
AST: 22 U/L (ref 15–41)
Albumin: 4 g/dL (ref 3.5–5.0)
Alkaline Phosphatase: 83 U/L (ref 38–126)
Anion gap: 9 (ref 5–15)
BUN: 5 mg/dL — ABNORMAL LOW (ref 6–20)
CO2: 22 mmol/L (ref 22–32)
Calcium: 8.7 mg/dL — ABNORMAL LOW (ref 8.9–10.3)
Chloride: 108 mmol/L (ref 98–111)
Creatinine, Ser: 0.8 mg/dL (ref 0.44–1.00)
GFR calc Af Amer: >60 mL/min (ref >60)
GFR calc non Af Amer: >60 mL/min (ref >60)
Glucose, Bld: 76 mg/dL (ref 70–99)
Potassium: 4.8 mmol/L (ref 3.5–5.1)
Sodium: 139 mmol/L (ref 135–145)
Total Bilirubin: 0.5 mg/dL (ref 0.3–1.2)
Total Protein: 7.3 g/dL (ref 6.5–8.1)

## 2019-10-17 LAB — ACETAMINOPHEN LEVEL: Acetaminophen (Tylenol), Serum: <10 ug/mL — ABNORMAL LOW (ref 10–30)

## 2019-10-17 LAB — RAPID URINE DRUG SCREEN, HOSP PERFORMED
Amphetamines: NOT DETECTED
Barbiturates: NOT DETECTED
Benzodiazepines: POSITIVE — AB
Cocaine: NOT DETECTED
Opiates: NOT DETECTED
Tetrahydrocannabinol: NOT DETECTED

## 2019-10-17 LAB — ETHANOL: Alcohol, Ethyl (B): <10 mg/dL (ref <10)

## 2019-10-17 LAB — I-STAT BETA HCG BLOOD, ED (MC, WL, AP ONLY): I-stat hCG, quantitative: <5 m[IU]/mL (ref <5)

## 2019-10-17 LAB — SALICYLATE LEVEL: Salicylate Lvl: <7 mg/dL (ref 2.8–30.0)

## 2019-10-17 LAB — CBG MONITORING, ED: Glucose-Capillary: 91 mg/dL (ref 70–99)

## 2019-10-17 MED ORDER — SODIUM CHLORIDE 0.9 % IV BOLUS
1000.0000 mL | Freq: Once | INTRAVENOUS | Status: AC
  Administered 2019-10-17: 1000 mL via INTRAVENOUS

## 2019-10-17 MED ORDER — NALOXONE HCL 0.4 MG/ML IJ SOLN
0.4000 mg | Freq: Once | INTRAMUSCULAR | Status: AC
  Administered 2019-10-17: 14:00:00 0.4 mg via INTRAMUSCULAR
  Filled 2019-10-17: qty 1

## 2019-10-17 NOTE — ED Notes (Signed)
Pt requested to speak with EDP before leaving. EDP notified.

## 2019-10-17 NOTE — ED Provider Notes (Signed)
[facility] DEPT Provider Note   CSN: [id number] Arrival date & time: 10/17/19  1204     History   Chief Complaint Chief Complaint  Patient presents with  . Altered Mental Status    HPI [name] is a 43 y.o. female.     HPI   73yF with AMS. History from mother primarily. Son found her at home this morning poorly responsive.  She is on the floor and incontinent of urine.  She was last known around 2:00 this morning when she apparently came home from work and was last seen cleaning up around the home.  Apparently was reportedly alert and oriented for EMS.  She is able to tell me that she remembers coming home from work and cleaning her bedroom.  She cannot account for what may have transpired since then.  She denies any ingestion or trauma.  She is able to recognize that she is in the hospital but is not sure why she is here.  She denies any acute pain.  Past Medical History:  Diagnosis Date  . Anemia   . Cervical disc disease   . Fibroids   . Heart murmur   . Low back pain   . Partial small bowel obstruction ([place]) 03/2018    Patient Active Problem List   Diagnosis Date Noted  . Chronically on opiate therapy   . Acute lower UTI 03/19/2018  . Dysfunctional uterine bleeding 03/19/2018  . Intra-abdominal abscess ([place]) 02/01/2018  . Partial small bowel obstruction ([place]) 02/29/2016  . Recurrent abdominal pain 02/29/2016  . Chronic anemia 02/29/2016  . Chronic constipation 02/19/2016  . Narcotic dependence ([place]) 02/19/2016  . Ileus ([place]) 02/08/2016  . Chronic narcotic use 02/08/2016  . Anemia, iron deficiency   . Small bowel obstruction ([place]) 01/29/2016  . Hypokalemia 01/29/2016  . Iron deficiency anemia due to chronic blood loss 01/29/2016  . Anemia 01/29/2016  . Abdominal pain 01/09/2016  . Generalized abdominal pain 01/09/2016  . Nausea with vomiting 01/09/2016  . Tobacco abuse 01/09/2016  . Abdominal pain in female   .  Constipation due to opioid therapy     Past Surgical History:  Procedure Laterality Date  . ANKLE SURGERY    . CESAREAN SECTION    . TUBAL LIGATION       OB History    Gravida  4   Para  4   Term      Preterm  4   AB      Living  4     SAB      TAB      Ectopic      Multiple      Live Births               Home Medications    Prior to Admission medications   Medication Sig Start Date End Date Taking? Authorizing Provider  acetaminophen (TYLENOL) 325 MG tablet Take 2 tablets (650 mg total) by mouth every 6 (six) hours as needed for mild pain or moderate pain (or Fever >/= 101). 03/25/18   Raiford Noble Latif, DO  feeding supplement, ENSURE ENLIVE, (ENSURE ENLIVE) LIQD Take 237 mLs by mouth 2 (two) times daily between meals. 03/25/18   Raiford Noble Latif, DO  ferrous sulfate 325 (65 FE) MG tablet Take 1 tablet (325 mg total) by mouth 2 (two) times daily with a meal. Patient not taking: Reported on 03/19/2018 02/06/18   Bonnielee Haff, MD  Heating Pads (HEATING  PAD DRY HEAT) PADS Use as directed 02/06/18   Bonnielee Haff, MD  hydrocortisone cream 1 % Apply topically as needed for itching. 03/25/18   Sheikh, Omair Latif, DO  Ibuprofen-diphenhydrAMINE Cit (ADVIL PM PO) Take 1 tablet by mouth once.    [provider]  linaclotide Rolan Lipa) 290 MCG CAPS capsule Take 1 capsule (290 mcg total) by mouth daily before breakfast. 03/26/18   Raiford Noble Latif, DO  naloxegol oxalate (MOVANTIK) 25 MG TABS tablet Take 1 tablet (25 mg total) by mouth daily. 03/26/18   Sheikh, Omair Latif, DO  ondansetron (ZOFRAN) 4 MG tablet Take 1 tablet (4 mg total) by mouth every 6 (six) hours as needed for nausea. 03/25/18   Sheikh, Georgina Quint Latif, DO  polyethylene glycol [facility] / GLYCOLAX) packet Take 17 g by mouth 2 (two) times daily. 03/25/18   Raiford Noble Latif, DO  rifaximin (XIFAXAN) 200 MG tablet Take 1 tablet (200 mg total) by mouth 3 (three) times daily. 03/25/18   Sheikh, Omair  Latif, DO  senna-docusate (SENOKOT-S) 8.6-50 MG tablet Take 1 tablet by mouth 2 (two) times daily. 03/25/18   Kerney Elbe, DO    Family History Family History  Problem Relation Age of Onset  . Diabetes Maternal Grandmother   . Hypertension Maternal Aunt   . Bowel Disease Mother     Social History Social History   Tobacco Use  . Smoking status: Current Every Day Smoker    Packs/day: 0.50    Years: 16.00    Pack years: 8.00    Types: Cigarettes  . Smokeless tobacco: Never Used  Substance Use Topics  . Alcohol use: No    Alcohol/week: 0.0 standard drinks  . Drug use: No     Allergies   Patient has no known allergies.   Review of Systems Review of Systems  All systems reviewed and negative, other than as noted in HPI. Physical Exam Updated Vital Signs BP (!) 157/92   Pulse 81   Temp (!) 97.3 F (36.3 C) (Axillary)   Resp 17   Wt 69.4 kg   SpO2 100%   BMI 26.26 kg/m   Physical Exam Vitals signs and nursing note reviewed.  Constitutional:      General: She is not in acute distress.    Appearance: She is well-developed.     Comments: Laying in bed. Drowsy. Opens eyes to voice and answers some questions although speech is somewhat slurred.   HENT:     Head: Normocephalic and atraumatic.  Eyes:     General:        Right eye: No discharge.        Left eye: No discharge.     Conjunctiva/sclera: Conjunctivae normal.  Neck:     Musculoskeletal: Neck supple.  Cardiovascular:     Rate and Rhythm: Normal rate and regular rhythm.     Heart sounds: Normal heart sounds. No murmur. No friction rub. No gallop.   Pulmonary:     Effort: Pulmonary effort is normal. No respiratory distress.     Breath sounds: Normal breath sounds.  Abdominal:     General: There is no distension.     Palpations: Abdomen is soft.     Tenderness: There is no abdominal tenderness.  Musculoskeletal:        General: No tenderness.  Skin:    General: Skin is warm and dry.   Neurological:     Mental Status: She is alert.     Comments: Oriented  to person place and time.  Answering simple questions although speech is somewhat slurred and needs a lot of encouragement.  Follows simple commands such as squeezing fingers and moving feet.  Global weakness without any focal deficits.       ED Treatments / Results  Labs (all labs ordered are listed, but only abnormal results are displayed) Labs Reviewed  COMPREHENSIVE METABOLIC PANEL - Abnormal; Notable for the following components:      Result Value   BUN 5 (*)    Calcium 8.7 (*)    All other components within normal limits  CBC - Abnormal; Notable for the following components:   RBC 3.60 (*)    Hemoglobin 9.3 (*)    HCT 32.2 (*)    MCH 25.8 (*)    MCHC 28.9 (*)    RDW 16.9 (*)    Platelets 122 (*)    All other components within normal limits  RAPID URINE DRUG SCREEN, HOSP PERFORMED - Abnormal; Notable for the following components:   Benzodiazepines POSITIVE (*)    All other components within normal limits  ACETAMINOPHEN LEVEL - Abnormal; Notable for the following components:   Acetaminophen (Tylenol), Serum <10 (*)    All other components within normal limits  URINALYSIS, ROUTINE W REFLEX MICROSCOPIC - Abnormal; Notable for the following components:   Specific Gravity, Urine 1.004 (*)    Hgb urine dipstick SMALL (*)    Nitrite POSITIVE (*)    Bacteria, UA RARE (*)    All other components within normal limits  ETHANOL  SALICYLATE LEVEL  CBG MONITORING, ED  I-STAT BETA HCG BLOOD, ED ([facility], [facility], [facility] ONLY)    EKG None  Radiology No results found.  Procedures Procedures (including critical care time)  Medications Ordered in ED Medications  naloxone (NARCAN) injection 0.4 mg (0.4 mg Intramuscular Given 10/17/19 1332)  sodium chloride 0.9 % bolus 1,000 mL (0 mLs Intravenous Stopped 10/17/19 1503)     Initial Impression / Assessment and Plan / ED Course  I have reviewed the triage vital signs and  the nursing notes.  Pertinent labs & imaging results that were available during my care of the patient were reviewed by me and considered in my medical decision making (see chart for details).        43 year old female with change in mental status.  My suspicion is that this may be an ingestion.  Her mother reports that patient is in pain management.  She also reports that she is prescribed Xanax for "anger problems."  UDS is positive for benzos but negative for opiates.  She was given Narcan with no clinical response.  Since being in the emergency room she actually seems more drowsy.  Now not answering questions although she will open her eyes and make eye contact.  I question whether this may be volitional though.  She is afebrile.  No nuchal rigidity.  Her labs are unremarkable.  CT the head is pending.  Final Clinical Impressions(s) / ED Diagnoses   Final diagnoses:  Altered mental status, unspecified altered mental status type    ED Discharge Orders    None       Virgel Manifold, MD 10/18/19 [phone]

## 2019-10-17 NOTE — ED Triage Notes (Signed)
Patient BIB EMS from home. Patient's family states they found her on the floor prior to 911 call. Patient altered. Last known normal was 0200 this morning, "while she was cleaning." Arousable to pain. Alert x oriented x4 for EMS. Pupils pinpoint 2+ for EMS. Patient denies pain or complaint for EMS.

## 2019-10-17 NOTE — ED Provider Notes (Signed)
3:35 PM-checkout from Dr. Wilson Singer to evaluate patient for altered mental status, suspected to be benzodiazepine related, to observe and evaluate CT imaging.  She was given Narcan with no change in her mental status.  Screening labs have been done and are noted by me.  Head CT per radiologist, no acute abnormalities, I interpreted the films as well.  No evident mass or bleeding.   Patient Vitals for the past 24 hrs:  BP Temp Temp src Pulse Resp SpO2 Weight  10/17/19 2230 (!) 148/85 - - 65 20 99 % -  10/17/19 2215 (!) 151/91 - - 65 18 99 % -  10/17/19 2130 136/85 - - 66 14 100 % -  10/17/19 2115 (!) 135/94 - - 79 15 100 % -  10/17/19 2045 139/90 - - 67 17 100 % -  10/17/19 2015 (!) 149/89 - - 72 18 100 % -  10/17/19 2000 140/81 - - 72 18 100 % -  10/17/19 1930 132/82 - - 78 17 99 % -  10/17/19 1915 (!) 145/85 - - 73 (!) 21 100 % -  10/17/19 1900 (!) 154/90 - - 74 19 100 % -  10/17/19 1830 (!) 141/95 - - 86 20 100 % -  10/17/19 1815 (!) 153/97 - - 81 17 100 % -  10/17/19 1645 (!) 146/91 - - 73 19 100 % -  10/17/19 1630 (!) 134/91 - - 76 19 100 % -  10/17/19 1615 (!) 144/88 - - 73 20 100 % -  10/17/19 1600 (!) 149/88 - - 70 20 100 % -  10/17/19 1545 (!) 151/87 - - 66 (!) 22 99 % -  10/17/19 1530 (!) 190/88 - - 70 20 100 % -  10/17/19 1515 (!) 147/90 - - 66 (!) 22 100 % -  10/17/19 1500 (!) 150/91 - - 71 (!) 22 100 % -  10/17/19 1445 (!) 156/94 - - 70 (!) 23 100 % -  10/17/19 1430 (!) 160/90 - - 82 (!) 28 100 % -  10/17/19 1415 (!) 151/90 - - 78 (!) 30 100 % -  10/17/19 1345 (!) 157/92 - - 81 17 100 % -  10/17/19 1330 (!) 163/88 - - 63 20 100 % -  10/17/19 1328 (!) 159/89 - - 64 16 100 % -  10/17/19 1229 (!) 159/88 - - 64 18 100 % -  10/17/19 1227 - - - - - - 69.4 kg  10/17/19 1224 (!) 159/88 (!) 97.3 F (36.3 C) Axillary 62 - 100 % -    6:05 PM Reevaluation with update and discussion. After initial assessment and treatment, an updated evaluation reveals at this time she is alert and  lucid.  Her mother is with her.  Patient states that she was tired when she got home from work last night about 1:30 AM, and was up for a while doing some cleaning before she went to bed.  At this time will order ambulation trial and diet trial.. Daleen Bo   10:34 PM -she has been able to eat and has no further complaints.  Urinalysis checked at her request, does not show infection  Medical Decision Making: Nonspecific altered mental status with benzodiazepine use.  Patient improved with observation and has reassuring clinical evaluation.  She has mild persistent hypertension.  Doubt hypertensive urgency.  CRITICAL CARE-no Performed by: Daleen Bo  Nursing Notes Reviewed/ Care Coordinated Applicable Imaging Reviewed Interpretation of Laboratory Data incorporated into ED treatment  The patient  appears reasonably screened and/or stabilized for discharge and I doubt any other medical condition or other [facility] requiring further screening, evaluation, or treatment in the ED at this time prior to discharge.  Plan: Home Medications-continue current; Home Treatments-rest, fluids; return here if the recommended treatment, does not improve the symptoms; Recommended follow up-PCP, as needed     Daleen Bo, MD 10/17/19 2322

## 2019-10-17 NOTE — ED Notes (Signed)
Pt states she has been having pain on her right side. She also states that she has associated discomfort when she uses the bathroom. She states she noticed this two days ago but just tried to increase her water intake. Pt was alert but is now beginning to get more lethargic and falling asleep while eating.

## 2019-10-17 NOTE — ED Notes (Signed)
Pt able to ambulate with minimal assistance.

## 2019-10-17 NOTE — ED Notes (Signed)
Patient transported to CT 

## 2020-06-22 ENCOUNTER — Other Ambulatory Visit: Payer: Self-pay

## 2020-06-22 ENCOUNTER — Ambulatory Visit (HOSPITAL_COMMUNITY)
Admission: EM | Admit: 2020-06-22 | Discharge: 2020-06-22 | Disposition: A | Discharge status: Home / Self Care | Payer: Medicaid Other | Attending: Family Medicine | Admitting: Family Medicine

## 2020-06-22 ENCOUNTER — Encounter (HOSPITAL_COMMUNITY): Payer: Self-pay

## 2020-06-22 DIAGNOSIS — N760 Acute vaginitis: Secondary | ICD-10-CM | POA: Insufficient documentation

## 2020-06-22 DIAGNOSIS — R109 Unspecified abdominal pain: Secondary | ICD-10-CM

## 2020-06-22 DIAGNOSIS — Z3202 Encounter for pregnancy test, result negative: Secondary | ICD-10-CM | POA: Diagnosis not present

## 2020-06-22 DIAGNOSIS — Z113 Encounter for screening for infections with a predominantly sexual mode of transmission: Secondary | ICD-10-CM | POA: Insufficient documentation

## 2020-06-22 DIAGNOSIS — N3090 Cystitis, unspecified without hematuria: Secondary | ICD-10-CM | POA: Insufficient documentation

## 2020-06-22 LAB — POCT URINALYSIS DIP (DEVICE)
Glucose, UA: 100 mg/dL — AB
Nitrite: POSITIVE — AB
Protein, ur: 30 mg/dL — AB
Specific Gravity, Urine: >=1.03 (ref 1.005–1.030)
Urobilinogen, UA: 1 mg/dL (ref 0.0–1.0)
pH: 5.5 (ref 5.0–8.0)

## 2020-06-22 LAB — POC URINE PREG, ED: Preg Test, Ur: NEGATIVE

## 2020-06-22 MED ORDER — FLUCONAZOLE 150 MG PO TABS: 150.0000 mg | ORAL_TABLET | Freq: Every day | ORAL | 0 refills | Status: DC

## 2020-06-22 MED ORDER — CEPHALEXIN 500 MG PO CAPS: 500.0000 mg | ORAL_CAPSULE | Freq: Two times a day (BID) | ORAL | 0 refills | Status: DC

## 2020-06-22 NOTE — ED Triage Notes (Addendum)
Pt c/o 8/10 mid pelvic cramping, vaginal burningx3 days.

## 2020-06-22 NOTE — ED Provider Notes (Signed)
[facility]    CSN: [id number] Arrival date & time: 06/22/20  [place]      History   Chief Complaint Chief Complaint  Patient presents with  . Abdominal Pain    HPI [name] is a 44 y.o. female.   HPI  Patient states that she has some suprapubic crampy pain.  It comes and goes.  She states she has not had a menstrual period for a couple of months.  She also states she has vaginal burning.  She has unprotected sexual relations.  She states that she does not think she has an STD check for this.  She does have burning when she passes her urine.  She is going more often than usual.  No hematuria. No fever or chills No nausea or vomiting  no rash  Past Medical History:  Diagnosis Date  . Anemia   . Cervical disc disease   . Fibroids   . Heart murmur   . Low back pain   . Partial small bowel obstruction ([place]) 03/2018    Patient Active Problem List   Diagnosis Date Noted  . Chronically on opiate therapy   . Acute lower UTI 03/19/2018  . Dysfunctional uterine bleeding 03/19/2018  . Intra-abdominal abscess ([place]) 02/01/2018  . Partial small bowel obstruction ([place]) 02/29/2016  . Recurrent abdominal pain 02/29/2016  . Chronic anemia 02/29/2016  . Chronic constipation 02/19/2016  . Narcotic dependence ([place]) 02/19/2016  . Ileus ([place]) 02/08/2016  . Chronic narcotic use 02/08/2016  . Anemia, iron deficiency   . Small bowel obstruction ([place]) 01/29/2016  . Hypokalemia 01/29/2016  . Iron deficiency anemia due to chronic blood loss 01/29/2016  . Anemia 01/29/2016  . Abdominal pain 01/09/2016  . Generalized abdominal pain 01/09/2016  . Nausea with vomiting 01/09/2016  . Tobacco abuse 01/09/2016  . Abdominal pain in female   . Constipation due to opioid therapy     Past Surgical History:  Procedure Laterality Date  . ANKLE SURGERY    . CESAREAN SECTION    . TUBAL LIGATION      OB History    Gravida  4   Para  4   Term      Preterm  4   AB       Living  4     SAB      TAB      Ectopic      Multiple      Live Births               Home Medications    Prior to Admission medications   Medication Sig Start Date End Date Taking? Authorizing Provider  acetaminophen (TYLENOL) 325 MG tablet Take 2 tablets (650 mg total) by mouth every 6 (six) hours as needed for mild pain or moderate pain (or Fever >/= 101). 03/25/18   Raiford Noble Latif, DO  ALPRAZolam Duanne Moron) 0.25 MG tablet Take 0.25 mg by mouth daily as needed. 05/26/20   [provider]  cephALEXin (KEFLEX) 500 MG capsule Take 1 capsule (500 mg total) by mouth 2 (two) times daily. 06/22/20   Raylene Everts, MD  fluconazole (DIFLUCAN) 150 MG tablet Take 1 tablet (150 mg total) by mouth daily. Repeat in 1 week if needed 06/22/20   Raylene Everts, MD  Ibuprofen-diphenhydrAMINE Cit (ADVIL PM PO) Take 1 tablet by mouth once.    [provider]  oxyCODONE-acetaminophen (PERCOCET) 10-325 MG tablet Take 1 tablet by mouth.  [provider]  ferrous sulfate 325 (65 FE) MG tablet Take 1 tablet (325 mg total) by mouth 2 (two) times daily with a meal. Patient not taking: Reported on 03/19/2018 02/06/18 06/22/20  Bonnielee Haff, MD  linaclotide [facility]) 290 MCG CAPS capsule Take 1 capsule (290 mcg total) by mouth daily before breakfast. 03/26/18 06/22/20  Kerney Elbe, DO    Family History Family History  Problem Relation Age of Onset  . Diabetes Maternal Grandmother   . Hypertension Maternal Aunt   . Bowel Disease Mother     Social History Social History   Tobacco Use  . Smoking status: Current Every Day Smoker    Packs/day: 0.50    Years: 16.00    Pack years: 8.00    Types: Cigarettes  . Smokeless tobacco: Never Used  Vaping Use  . Vaping Use: Never used  Substance Use Topics  . Alcohol use: No    Alcohol/week: 0.0 standard drinks  . Drug use: No     Allergies   Patient has no known allergies.   Review of Systems Review of  Systems   Physical Exam Triage Vital Signs ED Triage Vitals  Enc Vitals Group     BP 06/22/20 2024 112/63     Pulse Rate 06/22/20 2024 77     Resp 06/22/20 2024 16     Temp 06/22/20 2024 98.5 F (36.9 C)     Temp Source 06/22/20 2024 Oral     SpO2 06/22/20 2024 100 %     Weight 06/22/20 2025 145 lb (65.8 kg)     Height 06/22/20 2025 5\' 4"  (1.626 m)     Head Circumference --      Peak Flow --      Pain Score 06/22/20 2024 4     Pain Loc --      Pain Edu? --      Excl. in [place]? --    No data found.  Updated Vital Signs BP 112/63   Pulse 77   Temp 98.5 F (36.9 C) (Oral)   Resp 16   Ht 5\' 4"  (1.626 m)   Wt 65.8 kg   SpO2 100%   BMI 24.89 kg/m       Physical Exam   UC Treatments / Results  Labs (all labs ordered are listed, but only abnormal results are displayed) Labs Reviewed  POCT URINALYSIS DIP (DEVICE) - Abnormal; Notable for the following components:      Result Value   Glucose, UA 100 (*)    Bilirubin Urine SMALL (*)    Ketones, ur TRACE (*)    Hgb urine dipstick TRACE (*)    Protein, ur 30 (*)    Nitrite POSITIVE (*)    Leukocytes,Ua TRACE (*)    All other components within normal limits  URINE CULTURE  POC URINE PREG, ED  CERVICOVAGINAL ANCILLARY ONLY   Urine pregnancy is negative EKG   Radiology No results found.  Procedures Procedures (including critical care time)  Medications Ordered in UC Medications - No data to display  Initial Impression / Assessment and Plan / UC Course  I have reviewed the triage vital signs and the nursing notes.  Pertinent labs & imaging results that were available during my care of the patient were reviewed by me and considered in my medical decision making (see chart for details).      Final Clinical Impressions(s) / UC Diagnoses   Final diagnoses:  Acute vaginitis  Cystitis  Screening  for STD (sexually transmitted disease)     Discharge Instructions     Take the antibiotic as directed for  urine infection  The diflucan is for vaginal burning Drink more water Check your results on my Chart You will be called if tests are negative    ED Prescriptions    Medication Sig Dispense Auth. Provider   cephALEXin (KEFLEX) 500 MG capsule Take 1 capsule (500 mg total) by mouth 2 (two) times daily. 10 capsule Raylene Everts, MD   fluconazole (DIFLUCAN) 150 MG tablet Take 1 tablet (150 mg total) by mouth daily. Repeat in 1 week if needed 2 tablet Raylene Everts, MD     PDMP not reviewed this encounter.   Raylene Everts, MD 06/22/20 2154

## 2020-06-22 NOTE — Discharge Instructions (Addendum)
Take the antibiotic as directed for urine infection  The diflucan is for vaginal burning Drink more water Check your results on my Chart You will not be called if tests are negative

## 2020-06-24 ENCOUNTER — Telehealth (HOSPITAL_COMMUNITY): Payer: Self-pay

## 2020-06-24 LAB — CERVICOVAGINAL ANCILLARY ONLY
Chlamydia: POSITIVE — AB
Comment: NEGATIVE
Comment: NEGATIVE
Comment: NORMAL
Neisseria Gonorrhea: NEGATIVE
Trichomonas: NEGATIVE

## 2020-06-24 LAB — URINE CULTURE

## 2020-06-24 MED ORDER — AZITHROMYCIN 250 MG PO TABS: 1000.0000 mg | ORAL_TABLET | Freq: Once | ORAL | 0 refills | Status: AC

## 2020-08-03 ENCOUNTER — Encounter (HOSPITAL_COMMUNITY): Payer: Self-pay

## 2020-08-03 ENCOUNTER — Other Ambulatory Visit: Payer: Self-pay

## 2020-08-03 ENCOUNTER — Ambulatory Visit (HOSPITAL_COMMUNITY)
Admission: EM | Admit: 2020-08-03 | Discharge: 2020-08-03 | Disposition: A | Discharge status: Home / Self Care | Payer: Medicaid Other | Attending: Family Medicine | Admitting: Family Medicine

## 2020-08-03 DIAGNOSIS — Z3202 Encounter for pregnancy test, result negative: Secondary | ICD-10-CM | POA: Diagnosis not present

## 2020-08-03 DIAGNOSIS — R1030 Lower abdominal pain, unspecified: Secondary | ICD-10-CM | POA: Diagnosis present

## 2020-08-03 DIAGNOSIS — Z9851 Tubal ligation status: Secondary | ICD-10-CM | POA: Diagnosis present

## 2020-08-03 DIAGNOSIS — R109 Unspecified abdominal pain: Secondary | ICD-10-CM

## 2020-08-03 DIAGNOSIS — R350 Frequency of micturition: Secondary | ICD-10-CM | POA: Diagnosis present

## 2020-08-03 LAB — POC URINE PREG, ED: Preg Test, Ur: NEGATIVE

## 2020-08-03 LAB — POCT URINALYSIS DIPSTICK, ED / UC
Bilirubin Urine: NEGATIVE
Glucose, UA: NEGATIVE mg/dL
Ketones, ur: NEGATIVE mg/dL
Leukocytes,Ua: NEGATIVE
Nitrite: NEGATIVE
Protein, ur: NEGATIVE mg/dL
Specific Gravity, Urine: 1.02 (ref 1.005–1.030)
Urobilinogen, UA: 0.2 mg/dL (ref 0.0–1.0)
pH: 6 (ref 5.0–8.0)

## 2020-08-03 LAB — HCG, SERUM, QUALITATIVE: Preg, Serum: NEGATIVE

## 2020-08-03 NOTE — Discharge Instructions (Signed)
Make sure you hydrate very well with plain water and a quantity of 64 ounces of water a day.  Please limit drinks that are considered urinary irritants such as soda, sweet tea, coffee, energy drinks, alcohol.  These can worsen your symptoms and also be the source of them.  I will let you know about your through MyChart to see if we need to change your treatment plan based off of those results.

## 2020-08-03 NOTE — ED Notes (Signed)
Attempted to draw blood, patient moving arm states she wants the IV team called.  Unable to get blood due to patient moving arm.  RN notified.

## 2020-08-03 NOTE — ED Provider Notes (Signed)
[facility]   MRN: [id number] DOB: [birth year]  Subjective:   [name] is a 44 y.o. female presenting for 1 day history of lower abdominal pain, urinary frequency.  States that she was at her chronic pain clinic and was told that she was pregnant as she had 3+ urine pregnancy test.  She has a history of tubal ligation.  Denies fever, nausea, vomiting, vaginal discharge, genital rash.  Patient did test positive for chlamydia in July, states that she took all the azithromycin as prescribed.  No current facility-administered medications for this encounter.  Current Outpatient Medications:  .  acetaminophen (TYLENOL) 325 MG tablet, Take 2 tablets (650 mg total) by mouth every 6 (six) hours as needed for mild pain or moderate pain (or Fever >/= 101)., Disp: 30 tablet, Rfl: 0 .  ALPRAZolam (XANAX) 0.25 MG tablet, Take 0.25 mg by mouth daily as needed., Disp: , Rfl:  .  baclofen (LIORESAL) 10 MG tablet, Take 10 mg by mouth 3 (three) times daily as needed., Disp: , Rfl:  .  Ibuprofen-diphenhydrAMINE Cit (ADVIL PM PO), Take 1 tablet by mouth once., Disp: , Rfl:  .  oxyCODONE-acetaminophen (PERCOCET) 10-325 MG tablet, Take 1 tablet by mouth., Disp: , Rfl:  .  pregabalin (LYRICA) 75 MG capsule, Take 75 mg by mouth 2 (two) times daily., Disp: , Rfl:    No Known Allergies  Past Medical History:  Diagnosis Date  . Anemia   . Cervical disc disease   . Fibroids   . Heart murmur   . Low back pain   . Partial small bowel obstruction ([place]) 03/2018     Past Surgical History:  Procedure Laterality Date  . ANKLE SURGERY    . CESAREAN SECTION    . TUBAL LIGATION      Family History  Problem Relation Age of Onset  . Diabetes Maternal Grandmother   . Hypertension Maternal Aunt   . Bowel Disease Mother     Social History   Tobacco Use  . Smoking status: Current Every Day Smoker    Packs/day: 0.50    Years: 16.00    Pack years: 8.00    Types: Cigarettes  . Smokeless tobacco:  Never Used  Vaping Use  . Vaping Use: Never used  Substance Use Topics  . Alcohol use: No    Alcohol/week: 0.0 standard drinks  . Drug use: No    ROS   Objective:   Vitals: BP (!) 142/87   Pulse 80   Temp 98.3 F (36.8 C)   Resp 16   LMP  (LMP Unknown)   SpO2 100%   Physical Exam Constitutional:      General: She is not in acute distress.    Appearance: Normal appearance. She is well-developed. She is not ill-appearing, toxic-appearing or diaphoretic.  HENT:     Head: Normocephalic and atraumatic.     Nose: Nose normal.     Mouth/Throat:     Mouth: Mucous membranes are moist.     Pharynx: Oropharynx is clear.  Eyes:     General: No scleral icterus.    Extraocular Movements: Extraocular movements intact.     Pupils: Pupils are equal, round, and reactive to light.  Cardiovascular:     Rate and Rhythm: Normal rate.  Pulmonary:     Effort: Pulmonary effort is normal.  Abdominal:     General: Bowel sounds are normal. There is no distension.     Palpations: Abdomen is soft.  Tenderness: There is abdominal tenderness in the suprapubic area. There is no right CVA tenderness, left CVA tenderness, guarding or rebound.  Skin:    General: Skin is warm and dry.  Neurological:     General: No focal deficit present.     Mental Status: She is alert and oriented to person, place, and time.  Psychiatric:        Mood and Affect: Mood normal.        Behavior: Behavior normal.     Results for orders placed or performed during the hospital encounter of 08/03/20 (from the past 24 hour(s))  POC Urinalysis dipstick     Status: Abnormal   Collection Time: 08/03/20 12:30 PM  Result Value Ref Range   Glucose, UA NEGATIVE NEGATIVE mg/dL   Bilirubin Urine NEGATIVE NEGATIVE   Ketones, ur NEGATIVE NEGATIVE mg/dL   Specific Gravity, Urine 1.020 1.005 - 1.030   Hgb urine dipstick TRACE (A) NEGATIVE   pH 6.0 5.0 - 8.0   Protein, ur NEGATIVE NEGATIVE mg/dL   Urobilinogen, UA 0.2 0.0  - 1.0 mg/dL   Nitrite NEGATIVE NEGATIVE   Leukocytes,Ua NEGATIVE NEGATIVE  POC urine pregnancy     Status: None   Collection Time: 08/03/20 12:35 PM  Result Value Ref Range   Preg Test, Ur NEGATIVE NEGATIVE    Assessment and Plan :   PDMP not reviewed this encounter.  1. Urinary frequency   2. Lower abdominal pain   3. History of tubal ligation     Labs pending.  Emphasized need for hydrating better with water and avoiding urinary irritants as she drinks soda regularly.  STI panel recheck pending, beta hCG qualitative pending as well.  Will notify patient of her results through [place]. Counseled patient on potential for adverse effects with medications prescribed/recommended today, ER and return-to-clinic precautions discussed, patient verbalized understanding.    Jaynee Eagles, PA-C 08/03/20 1348

## 2020-08-03 NOTE — ED Triage Notes (Addendum)
Pt c/o low abdominal pain since yesterday with increased urination. States she was told by her pain clinic that she is pregnant but she has had "a surgery done to keep that from happening."

## 2020-08-04 LAB — CERVICOVAGINAL ANCILLARY ONLY
Bacterial Vaginitis (gardnerella): POSITIVE — AB
Chlamydia: NEGATIVE
Comment: NEGATIVE
Comment: NEGATIVE
Comment: NEGATIVE
Comment: NORMAL
Neisseria Gonorrhea: NEGATIVE
Trichomonas: NEGATIVE

## 2021-04-04 ENCOUNTER — Emergency Department (HOSPITAL_COMMUNITY)
Admission: EM | Admit: 2021-04-04 | Discharge: 2021-04-05 | Disposition: A | Discharge status: Home / Self Care | Payer: Medicaid Other | Attending: Emergency Medicine | Admitting: Emergency Medicine

## 2021-04-04 ENCOUNTER — Encounter (HOSPITAL_COMMUNITY): Payer: Self-pay

## 2021-04-04 ENCOUNTER — Other Ambulatory Visit: Payer: Self-pay

## 2021-04-04 DIAGNOSIS — N3000 Acute cystitis without hematuria: Secondary | ICD-10-CM

## 2021-04-04 DIAGNOSIS — F17210 Nicotine dependence, cigarettes, uncomplicated: Secondary | ICD-10-CM | POA: Diagnosis not present

## 2021-04-04 DIAGNOSIS — R1030 Lower abdominal pain, unspecified: Secondary | ICD-10-CM | POA: Diagnosis present

## 2021-04-04 LAB — COMPREHENSIVE METABOLIC PANEL
ALT: 32 U/L (ref 0–44)
AST: 28 U/L (ref 15–41)
Albumin: 3.3 g/dL — ABNORMAL LOW (ref 3.5–5.0)
Alkaline Phosphatase: 76 U/L (ref 38–126)
Anion gap: 5 (ref 5–15)
BUN: 7 mg/dL (ref 6–20)
CO2: 27 mmol/L (ref 22–32)
Calcium: 8.9 mg/dL (ref 8.9–10.3)
Chloride: 107 mmol/L (ref 98–111)
Creatinine, Ser: 0.79 mg/dL (ref 0.44–1.00)
GFR, Estimated: >60 mL/min (ref >60)
Glucose, Bld: 119 mg/dL — ABNORMAL HIGH (ref 70–99)
Potassium: 3.8 mmol/L (ref 3.5–5.1)
Sodium: 139 mmol/L (ref 135–145)
Total Bilirubin: 0.4 mg/dL (ref 0.3–1.2)
Total Protein: 7.4 g/dL (ref 6.5–8.1)

## 2021-04-04 LAB — CBC WITH DIFFERENTIAL/PLATELET
Abs Immature Granulocytes: 0.02 10*3/uL (ref 0.00–0.07)
Basophils Absolute: 0 10*3/uL (ref 0.0–0.1)
Basophils Relative: 1 %
Eosinophils Absolute: 0.1 10*3/uL (ref 0.0–0.5)
Eosinophils Relative: 2 %
HCT: 34.4 % — ABNORMAL LOW (ref 36.0–46.0)
Hemoglobin: 10.5 g/dL — ABNORMAL LOW (ref 12.0–15.0)
Immature Granulocytes: 0 %
Lymphocytes Relative: 47 %
Lymphs Abs: 3.1 10*3/uL (ref 0.7–4.0)
MCH: 28.6 pg (ref 26.0–34.0)
MCHC: 30.5 g/dL (ref 30.0–36.0)
MCV: 93.7 fL (ref 80.0–100.0)
Monocytes Absolute: 0.4 10*3/uL (ref 0.1–1.0)
Monocytes Relative: 6 %
Neutro Abs: 2.8 10*3/uL (ref 1.7–7.7)
Neutrophils Relative %: 44 %
Platelets: 306 10*3/uL (ref 150–400)
RBC: 3.67 MIL/uL — ABNORMAL LOW (ref 3.87–5.11)
RDW: 15.9 % — ABNORMAL HIGH (ref 11.5–15.5)
WBC: 6.5 10*3/uL (ref 4.0–10.5)
nRBC: 0 % (ref 0.0–0.2)

## 2021-04-04 LAB — LIPASE, BLOOD: Lipase: 23 U/L (ref 11–51)

## 2021-04-04 NOTE — ED Triage Notes (Signed)
Lower abdominal pain radiating to RLQ with painful urination. Reports urine smells funny.  Denies any nausea, vomiting and diarrhea. Also reports some vaginal discharge.

## 2021-04-04 NOTE — ED Triage Notes (Signed)
Emergency Medicine Provider Triage Evaluation Note  [name] , a 45 y.o. female  was evaluated in triage.  Pt complains of lower abdominal pain associated with dysuria x1 week. She also endorses malodorous urine. Increased vaginal discharge. No fever or chills.   Review of Systems  Positive: Abdominal pain, dysuria Negative: Fever, chills  Physical Exam  Ht 5\' 4"  (1.626 m)   Wt 65.8 kg   BMI 24.90 kg/m  Gen:   Awake, no distress   HEENT:  Atraumatic  Resp:  Normal effort  Cardiac:  Normal rate  Abd:   Nondistended, nontender  MSK:   Moves extremities without difficulty  Neuro:  Speech clear   Medical Decision Making  Medically screening exam initiated at 8:14 PM.  Appropriate orders placed.  [name] was informed that the remainder of the evaluation will be completed by another provider, this initial triage assessment does not replace that evaluation, and the importance of remaining in the ED until their evaluation is complete.  Clinical Impression  Abdominal pain with dysuria and vaginal discharge. Labs and UA ordered   Karie Kirks 04/04/21 2017

## 2021-04-05 LAB — URINALYSIS, ROUTINE W REFLEX MICROSCOPIC
Bilirubin Urine: NEGATIVE
Glucose, UA: NEGATIVE mg/dL
Ketones, ur: NEGATIVE mg/dL
Leukocytes,Ua: NEGATIVE
Nitrite: POSITIVE — AB
Protein, ur: NEGATIVE mg/dL
Specific Gravity, Urine: 1.013 (ref 1.005–1.030)
pH: 6 (ref 5.0–8.0)

## 2021-04-05 MED ORDER — PHENAZOPYRIDINE HCL 200 MG PO TABS: 200.0000 mg | ORAL_TABLET | Freq: Three times a day (TID) | ORAL | 0 refills | Status: DC

## 2021-04-05 MED ORDER — CEFTRIAXONE SODIUM 500 MG IJ SOLR
500.0000 mg | Freq: Once | INTRAMUSCULAR | Status: AC
  Administered 2021-04-05: 500 mg via INTRAMUSCULAR
  Filled 2021-04-05: qty 500

## 2021-04-05 MED ORDER — PHENAZOPYRIDINE HCL 100 MG PO TABS
95.0000 mg | ORAL_TABLET | Freq: Once | ORAL | Status: AC
  Administered 2021-04-05: 100 mg via ORAL
  Filled 2021-04-05: qty 1

## 2021-04-05 MED ORDER — STERILE WATER FOR INJECTION IJ SOLN
INTRAMUSCULAR | Status: AC
  Administered 2021-04-05: 10 mL
  Filled 2021-04-05: qty 10

## 2021-04-05 MED ORDER — OXYCODONE-ACETAMINOPHEN 5-325 MG PO TABS
1.0000 | ORAL_TABLET | Freq: Once | ORAL | Status: AC
  Administered 2021-04-05: 1 via ORAL
  Filled 2021-04-05: qty 1

## 2021-04-05 MED ORDER — KETOROLAC TROMETHAMINE 15 MG/ML IJ SOLN
15.0000 mg | Freq: Once | INTRAMUSCULAR | Status: AC
  Administered 2021-04-05: 15 mg via INTRAMUSCULAR
  Filled 2021-04-05: qty 1

## 2021-04-05 MED ORDER — CEPHALEXIN 500 MG PO CAPS: 500.0000 mg | ORAL_CAPSULE | Freq: Two times a day (BID) | ORAL | 0 refills | Status: AC

## 2021-04-05 NOTE — Discharge Instructions (Signed)
Take Keflex as prescribed for management of urinary tract infection.  You may use ibuprofen with Pyridium, as prescribed, for management of pain and bladder spasms.  Drink plenty of water and have your urine rechecked by your primary care doctor in 1 week to ensure that symptoms and infection have resolved.  Return for new or concerning symptoms.

## 2021-04-05 NOTE — ED Provider Notes (Signed)
[facility] EMERGENCY DEPARTMENT Provider Note   CSN: [id number] Arrival date & time: 04/04/21  2006     History Chief Complaint  Patient presents with  . Abdominal Pain  . Dysuria    [name] is a 45 y.o. female.  45 year old female presents to the emergency department for evaluation of abdominal pain.  Has had pain in her suprapubic abdomen which has been worsening x1 week.  Notes associated burning dysuria.  Has a history of urinary tract infections and states this feels similar.  Urine has been malodorous as well.  She has had some mild pain in her mid low back.  Denies fevers, vomiting, bowel changes.  Last sexually active 3 months ago.  Reports remote hx of trichomoniasis.   The history is provided by the patient. No language interpreter was used.  Abdominal Pain Associated symptoms: dysuria   Dysuria Associated symptoms: abdominal pain        Past Medical History:  Diagnosis Date  . Anemia   . Cervical disc disease   . Fibroids   . Heart murmur   . Low back pain   . Partial small bowel obstruction ([place]) 03/2018    Patient Active Problem List   Diagnosis Date Noted  . Chronically on opiate therapy   . Acute lower UTI 03/19/2018  . Dysfunctional uterine bleeding 03/19/2018  . Intra-abdominal abscess ([place]) 02/01/2018  . Partial small bowel obstruction ([place]) 02/29/2016  . Recurrent abdominal pain 02/29/2016  . Chronic anemia 02/29/2016  . Chronic constipation 02/19/2016  . Narcotic dependence ([place]) 02/19/2016  . Ileus ([place]) 02/08/2016  . Chronic narcotic use 02/08/2016  . Anemia, iron deficiency   . Small bowel obstruction ([place]) 01/29/2016  . Hypokalemia 01/29/2016  . Iron deficiency anemia due to chronic blood loss 01/29/2016  . Anemia 01/29/2016  . Abdominal pain 01/09/2016  . Generalized abdominal pain 01/09/2016  . Nausea with vomiting 01/09/2016  . Tobacco abuse 01/09/2016  . Abdominal pain in female   . Constipation due to  opioid therapy     Past Surgical History:  Procedure Laterality Date  . ANKLE SURGERY    . CESAREAN SECTION    . TUBAL LIGATION       OB History    Gravida  4   Para  4   Term      Preterm  4   AB      Living  4     SAB      IAB      Ectopic      Multiple      Live Births              Family History  Problem Relation Age of Onset  . Diabetes Maternal Grandmother   . Hypertension Maternal Aunt   . Bowel Disease Mother     Social History   Tobacco Use  . Smoking status: Current Every Day Smoker    Packs/day: 0.50    Years: 16.00    Pack years: 8.00    Types: Cigarettes  . Smokeless tobacco: Never Used  Vaping Use  . Vaping Use: Never used  Substance Use Topics  . Alcohol use: No    Alcohol/week: 0.0 standard drinks  . Drug use: No    Home Medications Prior to Admission medications   Medication Sig Start Date End Date Taking? Authorizing Provider  cephALEXin (KEFLEX) 500 MG capsule Take 1 capsule (500 mg total) by mouth 2 (two) times daily  for 7 days. 04/05/21 04/12/21 Yes Antonietta Breach, PA-C  phenazopyridine (PYRIDIUM) 200 MG tablet Take 1 tablet (200 mg total) by mouth 3 (three) times daily. 04/05/21  Yes Antonietta Breach, PA-C  acetaminophen (TYLENOL) 325 MG tablet Take 2 tablets (650 mg total) by mouth every 6 (six) hours as needed for mild pain or moderate pain (or Fever >/= 101). 03/25/18   Raiford Noble Latif, DO  ALPRAZolam Duanne Moron) 0.25 MG tablet Take 0.25 mg by mouth daily as needed. 05/26/20   [provider]  baclofen (LIORESAL) 10 MG tablet Take 10 mg by mouth 3 (three) times daily as needed. 06/03/20   [provider]  Ibuprofen-diphenhydrAMINE Cit (ADVIL PM PO) Take 1 tablet by mouth once.    [provider]  oxyCODONE-acetaminophen (PERCOCET) 10-325 MG tablet Take 1 tablet by mouth.    [provider]  pregabalin (LYRICA) 75 MG capsule Take 75 mg by mouth 2 (two) times daily. 05/20/20   [provider]   ferrous sulfate 325 (65 FE) MG tablet Take 1 tablet (325 mg total) by mouth 2 (two) times daily with a meal. Patient not taking: Reported on 03/19/2018 02/06/18 06/22/20  Bonnielee Haff, MD  linaclotide [facility]) 290 MCG CAPS capsule Take 1 capsule (290 mcg total) by mouth daily before breakfast. 03/26/18 06/22/20  Kerney Elbe, DO    Allergies    Patient has no known allergies.  Review of Systems   Review of Systems  Gastrointestinal: Positive for abdominal pain.  Genitourinary: Positive for dysuria.  Ten systems reviewed and are negative for acute change, except as noted in the HPI.    Physical Exam Updated Vital Signs BP (!) 145/89 (BP Location: Right Arm)   Pulse 64   Temp 98 F (36.7 C) (Oral)   Resp 18   Ht 5\' 4"  (1.626 m)   Wt 65.8 kg   SpO2 100%   BMI 24.90 kg/m   Physical Exam Vitals and nursing note reviewed.  Constitutional:      General: She is not in acute distress.    Appearance: She is well-developed. She is not diaphoretic.     Comments: Nontoxic appearing and in NAD  HENT:     Head: Normocephalic and atraumatic.  Eyes:     General: No scleral icterus.    Conjunctiva/sclera: Conjunctivae normal.  Cardiovascular:     Rate and Rhythm: Normal rate and regular rhythm.     Pulses: Normal pulses.  Pulmonary:     Effort: Pulmonary effort is normal. No respiratory distress.     Breath sounds: No stridor. No wheezing.     Comments: Respirations even and unlabored Abdominal:     Palpations: Abdomen is soft. There is no mass.     Tenderness: There is abdominal tenderness. There is no guarding.     Comments: Abdomen soft, nondistended.  There is mild tenderness in the suprapubic abdomen.  No peritoneal signs, palpable masses.  No guarding.  Musculoskeletal:        General: Normal range of motion.     Cervical back: Normal range of motion.  Skin:    General: Skin is warm and dry.     Coloration: Skin is not pale.     Findings: No erythema or rash.   Neurological:     Mental Status: She is alert and oriented to person, place, and time.     Coordination: Coordination normal.  Psychiatric:        Behavior: Behavior normal.  ED Results / Procedures / Treatments   Labs (all labs ordered are listed, but only abnormal results are displayed) Labs Reviewed  CBC WITH DIFFERENTIAL/PLATELET - Abnormal; Notable for the following components:      Result Value   RBC 3.67 (*)    Hemoglobin 10.5 (*)    HCT 34.4 (*)    RDW 15.9 (*)    All other components within normal limits  COMPREHENSIVE METABOLIC PANEL - Abnormal; Notable for the following components:   Glucose, Bld 119 (*)    Albumin 3.3 (*)    All other components within normal limits  URINALYSIS, ROUTINE W REFLEX MICROSCOPIC - Abnormal; Notable for the following components:   APPearance HAZY (*)    Hgb urine dipstick SMALL (*)    Nitrite POSITIVE (*)    Bacteria, UA RARE (*)    All other components within normal limits  URINE CULTURE  LIPASE, BLOOD    EKG None  Radiology No results found.  Procedures Procedures   Medications Ordered in ED Medications  oxyCODONE-acetaminophen (PERCOCET/ROXICET) 5-325 MG per tablet 1 tablet (has no administration in time range)  phenazopyridine (PYRIDIUM) tablet 100 mg (100 mg Oral Given 04/05/21 0201)  ketorolac (TORADOL) 15 MG/ML injection 15 mg (15 mg Intramuscular Given 04/05/21 0202)  cefTRIAXone (ROCEPHIN) injection 500 mg (500 mg Intramuscular Given 04/05/21 0247)  sterile water (preservative free) injection (10 mLs  Given 04/05/21 0247)    ED Course  I have reviewed the triage vital signs and the nursing notes.  Pertinent labs & imaging results that were available during my care of the patient were reviewed by me and considered in my medical decision making (see chart for details).    MDM Rules/Calculators/A&P                          Patient has been diagnosed with a UTI. She is afebrile, no CVA tenderness, normotensive,  and denies N/V. Patient to be discharged home with antibiotics and instructions to follow up with PCP if symptoms persist. Return precautions discussed and provided. Patient discharged in stable condition with no unaddressed concerns.   Final Clinical Impression(s) / ED Diagnoses Final diagnoses:  Acute cystitis without hematuria    Rx / DC Orders ED Discharge Orders         Ordered    cephALEXin (KEFLEX) 500 MG capsule  2 times daily        04/05/21 0251    phenazopyridine (PYRIDIUM) 200 MG tablet  3 times daily        04/05/21 0251           Antonietta Breach, PA-C 04/05/21 0256    Veryl Speak, MD 04/05/21 [phone]

## 2021-04-07 LAB — URINE CULTURE: Culture: >=100000 — AB

## 2021-04-08 ENCOUNTER — Telehealth: Payer: Self-pay | Admitting: *Deleted

## 2021-04-08 NOTE — Telephone Encounter (Signed)
Post ED Visit - Positive Culture Follow-up  Culture report reviewed by antimicrobial stewardship pharmacist: [place] Team []  Elenor Quinones, Pharm.D. []  Heide Guile, Pharm.D., BCPS AQ-ID []  Parks Neptune, Pharm.D., BCPS []  Alycia Rossetti, Pharm.D., BCPS []  [place], [place].D., BCPS, AAHIVP []  Legrand Como, Pharm.D., BCPS, AAHIVP []  Salome Arnt, PharmD, BCPS []  Johnnette Gourd, PharmD, BCPS []  Hughes Better, PharmD, BCPS []  Leeroy Cha, PharmD []  Laqueta Linden, PharmD, BCPS []  Albertina Parr, PharmD  [place] Team []  Leodis Sias, PharmD []  Lindell Spar, PharmD []  Royetta Asal, PharmD []  Graylin Shiver, Rph []  Rema Fendt) Glennon Mac, PharmD []  Arlyn Dunning, PharmD []  Netta Cedars, PharmD []  Dia Sitter, PharmD []  Leone Haven, PharmD []  Gretta Arab, PharmD []  Theodis Shove, PharmD []  Peggyann Juba, PharmD []  Reuel Boom, PharmD   Positive urine culture Treated with Cephalexin, organism sensitive to the same and no further patient follow-up is required at this time.  Harlon Flor [facility] 04/08/2021, 11:11 AM

## 2021-06-20 ENCOUNTER — Encounter (HOSPITAL_COMMUNITY): Payer: Self-pay | Admitting: *Deleted

## 2021-06-20 ENCOUNTER — Ambulatory Visit (HOSPITAL_COMMUNITY)
Admission: EM | Admit: 2021-06-20 | Discharge: 2021-06-20 | Disposition: A | Discharge status: Home / Self Care | Payer: Medicaid Other | Attending: Internal Medicine | Admitting: Internal Medicine

## 2021-06-20 ENCOUNTER — Other Ambulatory Visit: Payer: Self-pay

## 2021-06-20 ENCOUNTER — Emergency Department (HOSPITAL_COMMUNITY)
Admission: EM | Admit: 2021-06-20 | Discharge: 2021-06-20 | Disposition: A | Discharge status: Home / Self Care | Payer: Medicaid Other | Attending: Emergency Medicine | Admitting: Emergency Medicine

## 2021-06-20 DIAGNOSIS — Z5321 Procedure and treatment not carried out due to patient leaving prior to being seen by health care provider: Secondary | ICD-10-CM | POA: Diagnosis not present

## 2021-06-20 DIAGNOSIS — Z8719 Personal history of other diseases of the digestive system: Secondary | ICD-10-CM

## 2021-06-20 DIAGNOSIS — R1031 Right lower quadrant pain: Secondary | ICD-10-CM

## 2021-06-20 LAB — POCT URINALYSIS DIPSTICK, ED / UC
Bilirubin Urine: NEGATIVE
Glucose, UA: NEGATIVE mg/dL
Ketones, ur: NEGATIVE mg/dL
Leukocytes,Ua: NEGATIVE
Nitrite: NEGATIVE
Protein, ur: NEGATIVE mg/dL
Specific Gravity, Urine: 1.01 (ref 1.005–1.030)
Urobilinogen, UA: 0.2 mg/dL (ref 0.0–1.0)
pH: 7 (ref 5.0–8.0)

## 2021-06-20 LAB — POC URINE PREG, ED: Preg Test, Ur: NEGATIVE

## 2021-06-20 NOTE — ED Triage Notes (Signed)
Pt reports ABD pain started 3 days ago.

## 2021-06-20 NOTE — ED Provider Notes (Signed)
[place]    CSN: [id number] Arrival date & time: 06/20/21  1333      History   Chief Complaint Chief Complaint  Patient presents with   Abdominal Pain    HPI [name] is a 45 y.o. female.   HPI  Abdominal Pain: Pt reports abdominal pain for the past 3 days.  Pain is gradually worsening.  She reports that most of her pain is sharp to a strong ache in nature in her right lower quadrant to her umbilical area.  She denies any dysuria, urinary frequency, hematuria, nausea, vomiting, diarrhea, constipation or fevers.  She has tried various over-the-counter pain medications without any improvement.  Her last UTI was on 04/04/2021 which grew out E. coli.  She was treated with Keflex for this cystitis.  She has a history of a partial small bowel obstruction. Per pt S/P menopausal.   Past Medical History:  Diagnosis Date   Anemia    Cervical disc disease    Fibroids    Heart murmur    Low back pain    Partial small bowel obstruction ([place]) 03/2018    Patient Active Problem List   Diagnosis Date Noted   Chronically on opiate therapy    Acute lower UTI 03/19/2018   Dysfunctional uterine bleeding 03/19/2018   Intra-abdominal abscess ([place]) 02/01/2018   Partial small bowel obstruction ([place]) 02/29/2016   Recurrent abdominal pain 02/29/2016   Chronic anemia 02/29/2016   Chronic constipation 02/19/2016   Narcotic dependence ([place]) 02/19/2016   Ileus ([place]) 02/08/2016   Chronic narcotic use 02/08/2016   Anemia, iron deficiency    Small bowel obstruction ([place]) 01/29/2016   Hypokalemia 01/29/2016   Iron deficiency anemia due to chronic blood loss 01/29/2016   Anemia 01/29/2016   Abdominal pain 01/09/2016   Generalized abdominal pain 01/09/2016   Nausea with vomiting 01/09/2016   Tobacco abuse 01/09/2016   Abdominal pain in female    Constipation due to opioid therapy     Past Surgical History:  Procedure Laterality Date   ANKLE SURGERY     CESAREAN SECTION      TUBAL LIGATION      OB History     Gravida  4   Para  4   Term      Preterm  4   AB      Living  4      SAB      IAB      Ectopic      Multiple      Live Births               Home Medications    Prior to Admission medications   Medication Sig Start Date End Date Taking? Authorizing Provider  acetaminophen (TYLENOL) 325 MG tablet Take 2 tablets (650 mg total) by mouth every 6 (six) hours as needed for mild pain or moderate pain (or Fever >/= 101). 03/25/18   Raiford Noble Latif, DO  ALPRAZolam Duanne Moron) 0.25 MG tablet Take 0.25 mg by mouth daily as needed. 05/26/20   [provider]  baclofen (LIORESAL) 10 MG tablet Take 10 mg by mouth 3 (three) times daily as needed. 06/03/20   [provider]  Ibuprofen-diphenhydrAMINE Cit (ADVIL PM PO) Take 1 tablet by mouth once.    [provider]  oxyCODONE-acetaminophen (PERCOCET) 10-325 MG tablet Take 1 tablet by mouth.    [provider]  phenazopyridine (PYRIDIUM) 200 MG tablet Take 1 tablet (200 mg  total) by mouth 3 (three) times daily. 04/05/21   Antonietta Breach, PA-C  pregabalin (LYRICA) 75 MG capsule Take 75 mg by mouth 2 (two) times daily. 05/20/20   [provider]  ferrous sulfate 325 (65 FE) MG tablet Take 1 tablet (325 mg total) by mouth 2 (two) times daily with a meal. Patient not taking: Reported on 03/19/2018 02/06/18 06/22/20  Bonnielee Haff, MD  linaclotide [facility]) 290 MCG CAPS capsule Take 1 capsule (290 mcg total) by mouth daily before breakfast. 03/26/18 06/22/20  Kerney Elbe, DO    Family History Family History  Problem Relation Age of Onset   Bowel Disease Mother    Diabetes Maternal Grandmother    Hypertension Maternal Aunt     Social History Social History   Tobacco Use   Smoking status: Every Day    Packs/day: 0.50    Years: 16.00    Pack years: 8.00    Types: Cigarettes   Smokeless tobacco: Never  Vaping Use   Vaping Use: Never used   Substance Use Topics   Alcohol use: No    Alcohol/week: 0.0 standard drinks   Drug use: No     Allergies   Patient has no known allergies.   Review of Systems Review of Systems  As stated above in HPI Physical Exam Triage Vital Signs ED Triage Vitals [06/20/21 1431]  Enc Vitals Group     BP (!) 155/87     Pulse Rate 71     Resp 20     Temp 99.3 F (37.4 C)     Temp src      SpO2 99 %     Weight      Height      Head Circumference      Peak Flow      Pain Score 8     Pain Loc      Pain Edu?      Excl. in [place]?    No data found.  Updated Vital Signs BP (!) 155/87   Pulse 71   Temp 99.3 F (37.4 C)   Resp 20   LMP 05/21/2021   SpO2 99%   Physical Exam Vitals and nursing note reviewed.  Constitutional:      General: She is in acute distress.     Appearance: She is ill-appearing and toxic-appearing. She is not diaphoretic.     Comments: Patient holding her abdomen and rocking back and forth.  HENT:     Head: Normocephalic and atraumatic.  Eyes:     Extraocular Movements: Extraocular movements intact.     Pupils: Pupils are equal, round, and reactive to light.  Cardiovascular:     Rate and Rhythm: Normal rate and regular rhythm.     Heart sounds: Normal heart sounds.  Pulmonary:     Effort: Pulmonary effort is normal.     Breath sounds: Normal breath sounds.  Abdominal:     General: Bowel sounds are normal.     Palpations: Abdomen is soft.     Tenderness: There is abdominal tenderness in the right lower quadrant and periumbilical area. There is guarding and rebound. There is no right CVA tenderness or left CVA tenderness. Positive signs include McBurney's sign. Negative signs include Murphy's sign, Rovsing's sign, psoas sign and obturator sign.     Hernia: No hernia is present.  Skin:    General: Skin is warm.     Coloration: Skin is not jaundiced.  Neurological:  Mental Status: She is oriented to person, place, and time.     UC Treatments /  Results  Labs (all labs ordered are listed, but only abnormal results are displayed) Labs Reviewed - No data to display  EKG   Radiology No results found.  Procedures Procedures (including critical care time)  Medications Ordered in UC Medications - No data to display  Initial Impression / Assessment and Plan / UC Course  I have reviewed the triage vital signs and the nursing notes.  Pertinent labs & imaging results that were available during my care of the patient were reviewed by me and considered in my medical decision making (see chart for details).     New.  I am concerned for potential appendicitis especially since she has slightly elevated blood pressure reading and has a 99.3 degree temperature today along with symptoms.  In addition with her history of small bowel obstructions she will need imaging given her symptoms.  I have discussed this with patient who is agreeable.  She elects private vehicle transfer to the emergency department.  She will be n.p.o. Final Clinical Impressions(s) / UC Diagnoses   Final diagnoses:  None   Discharge Instructions   None    ED Prescriptions   None    PDMP not reviewed this encounter.   Hughie Closs, [place] 06/20/21 1458

## 2021-06-20 NOTE — Discharge Instructions (Addendum)
Do not have anything to eat or drink until they say it is ok at the emergency room

## 2021-06-20 NOTE — ED Notes (Signed)
Patient is being discharged from the Urgent Care and sent to the Emergency Department via POV. Per Nelwyn Salisbury PA, patient is in need of higher level of care due to need for rule out of appendicitis vs. Kidney stone. Patient is aware and verbalizes understanding of plan of care.  Vitals:   06/20/21 1431  BP: (!) 155/87  Pulse: 71  Resp: 20  Temp: 99.3 F (37.4 C)  SpO2: 99%

## 2021-06-20 NOTE — ED Notes (Signed)
Pt states she was sent from [facility] for an ultrasound it didn't know she had to check in to be a pt in ED.  States she doesn't want to wait and is leaving.  States she thought she was coming to go straight to [place].

## 2021-06-29 ENCOUNTER — Other Ambulatory Visit: Payer: Self-pay | Admitting: Internal Medicine

## 2021-06-29 DIAGNOSIS — Z1231 Encounter for screening mammogram for malignant neoplasm of breast: Secondary | ICD-10-CM

## 2021-07-01 ENCOUNTER — Other Ambulatory Visit: Payer: Self-pay

## 2021-07-01 ENCOUNTER — Ambulatory Visit
Admission: RE | Admit: 2021-07-01 | Discharge: 2021-07-01 | Disposition: A | Discharge status: Home / Self Care | Payer: Medicaid Other | Source: Ambulatory Visit | Attending: Internal Medicine | Admitting: Internal Medicine

## 2021-07-01 DIAGNOSIS — Z1231 Encounter for screening mammogram for malignant neoplasm of breast: Secondary | ICD-10-CM

## 2021-11-22 ENCOUNTER — Telehealth (HOSPITAL_COMMUNITY): Payer: Self-pay | Admitting: Physician Assistant

## 2021-11-22 ENCOUNTER — Encounter (HOSPITAL_COMMUNITY): Payer: Self-pay | Admitting: Emergency Medicine

## 2021-11-22 ENCOUNTER — Ambulatory Visit (HOSPITAL_COMMUNITY)
Admission: EM | Admit: 2021-11-22 | Discharge: 2021-11-22 | Disposition: A | Discharge status: Home / Self Care | Payer: Medicaid Other | Attending: Physician Assistant | Admitting: Physician Assistant

## 2021-11-22 ENCOUNTER — Other Ambulatory Visit: Payer: Self-pay

## 2021-11-22 DIAGNOSIS — R112 Nausea with vomiting, unspecified: Secondary | ICD-10-CM

## 2021-11-22 DIAGNOSIS — K30 Functional dyspepsia: Secondary | ICD-10-CM

## 2021-11-22 LAB — POCT URINALYSIS DIPSTICK, ED / UC
Bilirubin Urine: NEGATIVE
Glucose, UA: NEGATIVE mg/dL
Hgb urine dipstick: NEGATIVE
Ketones, ur: NEGATIVE mg/dL
Nitrite: NEGATIVE
Protein, ur: NEGATIVE mg/dL
Specific Gravity, Urine: 1.01 (ref 1.005–1.030)
Urobilinogen, UA: 0.2 mg/dL (ref 0.0–1.0)
pH: 7 (ref 5.0–8.0)

## 2021-11-22 LAB — COMPREHENSIVE METABOLIC PANEL
ALT: 29 U/L (ref 0–44)
AST: 20 U/L (ref 15–41)
Albumin: 4 g/dL (ref 3.5–5.0)
Alkaline Phosphatase: 70 U/L (ref 38–126)
Anion gap: 8 (ref 5–15)
BUN: 6 mg/dL (ref 6–20)
CO2: 30 mmol/L (ref 22–32)
Calcium: 9.4 mg/dL (ref 8.9–10.3)
Chloride: 101 mmol/L (ref 98–111)
Creatinine, Ser: 0.87 mg/dL (ref 0.44–1.00)
GFR, Estimated: >60 mL/min (ref >60)
Glucose, Bld: 111 mg/dL — ABNORMAL HIGH (ref 70–99)
Potassium: 3.1 mmol/L — ABNORMAL LOW (ref 3.5–5.1)
Sodium: 139 mmol/L (ref 135–145)
Total Bilirubin: 0.6 mg/dL (ref 0.3–1.2)
Total Protein: 7.7 g/dL (ref 6.5–8.1)

## 2021-11-22 LAB — CBC WITH DIFFERENTIAL/PLATELET
Abs Immature Granulocytes: 0.02 10*3/uL (ref 0.00–0.07)
Basophils Absolute: 0 10*3/uL (ref 0.0–0.1)
Basophils Relative: 0 %
Eosinophils Absolute: 0 10*3/uL (ref 0.0–0.5)
Eosinophils Relative: 0 %
HCT: 39.3 % (ref 36.0–46.0)
Hemoglobin: 12.9 g/dL (ref 12.0–15.0)
Immature Granulocytes: 0 %
Lymphocytes Relative: 41 %
Lymphs Abs: 2.9 10*3/uL (ref 0.7–4.0)
MCH: 30.4 pg (ref 26.0–34.0)
MCHC: 32.8 g/dL (ref 30.0–36.0)
MCV: 92.5 fL (ref 80.0–100.0)
Monocytes Absolute: 0.5 10*3/uL (ref 0.1–1.0)
Monocytes Relative: 8 %
Neutro Abs: 3.6 10*3/uL (ref 1.7–7.7)
Neutrophils Relative %: 51 %
Platelets: 239 10*3/uL (ref 150–400)
RBC: 4.25 MIL/uL (ref 3.87–5.11)
RDW: 13.7 % (ref 11.5–15.5)
WBC: 7 10*3/uL (ref 4.0–10.5)
nRBC: 0 % (ref 0.0–0.2)

## 2021-11-22 LAB — POC URINE PREG, ED: Preg Test, Ur: NEGATIVE

## 2021-11-22 MED ORDER — ONDANSETRON 4 MG PO TBDP: 4.0000 mg | ORAL_TABLET | Freq: Three times a day (TID) | ORAL | 0 refills | Status: DC | PRN

## 2021-11-22 MED ORDER — ONDANSETRON 4 MG PO TBDP
ORAL_TABLET | ORAL | Status: AC
  Filled 2021-11-22: qty 1

## 2021-11-22 MED ORDER — ONDANSETRON 4 MG PO TBDP
4.0000 mg | ORAL_TABLET | Freq: Once | ORAL | Status: AC
  Administered 2021-11-22: 4 mg via ORAL

## 2021-11-22 MED ORDER — POTASSIUM CHLORIDE ER 10 MEQ PO TBCR: 10.0000 meq | EXTENDED_RELEASE_TABLET | Freq: Every day | ORAL | 0 refills | Status: DC

## 2021-11-22 NOTE — ED Triage Notes (Signed)
Vomiting started yesterday morning.  Denies diarrhea.  No vomiting today, stomach feels queasy.

## 2021-11-22 NOTE — ED Provider Notes (Signed)
[place]    CSN: [id number] Arrival date & time: 11/22/21  1130      History   Chief Complaint Chief Complaint  Patient presents with   Emesis    HPI [name] is a 45 y.o. female.   Patient presents today with a 2-day history of nausea and vomiting with last episode of emesis approximately 24 hours ago.  She denies any abdominal pain but does report abdominal upset and feeling queasy.  She denies any melena, hematochezia, hematemesis, changes in bowel habits, fever.  Denies any recent illness or additional symptoms including cough or congestion.  Denies any known sick contacts.  She does have a history of small bowel obstruction but reports current symptoms not similar to previous episodes of this condition.  Reports she is passing gas normally and had a bowel movement yesterday evening that was normal.  She has tried Phenergan with improvement but not resolution of symptoms.  She does not drink alcohol and denies any suspicious food intake.  She does not take GLP-1 agonist.   Past Medical History:  Diagnosis Date   Anemia    Cervical disc disease    Fibroids    Heart murmur    Low back pain    Partial small bowel obstruction ([place]) 03/2018    Patient Active Problem List   Diagnosis Date Noted   Chronically on opiate therapy    Acute lower UTI 03/19/2018   Dysfunctional uterine bleeding 03/19/2018   Intra-abdominal abscess ([place]) 02/01/2018   Partial small bowel obstruction ([place]) 02/29/2016   Recurrent abdominal pain 02/29/2016   Chronic anemia 02/29/2016   Chronic constipation 02/19/2016   Narcotic dependence ([place]) 02/19/2016   Ileus ([place]) 02/08/2016   Chronic narcotic use 02/08/2016   Anemia, iron deficiency    Small bowel obstruction ([place]) 01/29/2016   Hypokalemia 01/29/2016   Iron deficiency anemia due to chronic blood loss 01/29/2016   Anemia 01/29/2016   Abdominal pain 01/09/2016   Generalized abdominal pain 01/09/2016   Nausea with  vomiting 01/09/2016   Tobacco abuse 01/09/2016   Abdominal pain in female    Constipation due to opioid therapy     Past Surgical History:  Procedure Laterality Date   ANKLE SURGERY     CESAREAN SECTION     TUBAL LIGATION      OB History     Gravida  4   Para  4   Term      Preterm  4   AB      Living  4      SAB      IAB      Ectopic      Multiple      Live Births               Home Medications    Prior to Admission medications   Medication Sig Start Date End Date Taking? Authorizing Provider  ondansetron (ZOFRAN-ODT) 4 MG disintegrating tablet Take 1 tablet (4 mg total) by mouth every 8 (eight) hours as needed for nausea or vomiting. 11/22/21  Yes Vedanshi Massaro K, PA-C  oxyCODONE-acetaminophen (PERCOCET) 10-325 MG tablet Take 1 tablet by mouth.   Yes [provider]  acetaminophen (TYLENOL) 325 MG tablet Take 2 tablets (650 mg total) by mouth every 6 (six) hours as needed for mild pain or moderate pain (or Fever >/= 101). 03/25/18   Raiford Noble Latif, DO  ALPRAZolam Duanne Moron) 0.25 MG tablet Take 0.25 mg by mouth  daily as needed. Patient not taking: Reported on 11/22/2021 05/26/20   [provider]  baclofen (LIORESAL) 10 MG tablet Take 10 mg by mouth 3 (three) times daily as needed. Patient not taking: Reported on 11/22/2021 06/03/20   [provider]  Ibuprofen-diphenhydrAMINE Cit (ADVIL PM PO) Take 1 tablet by mouth once.    [provider]  phenazopyridine (PYRIDIUM) 200 MG tablet Take 1 tablet (200 mg total) by mouth 3 (three) times daily. Patient not taking: Reported on 11/22/2021 04/05/21   Antonietta Breach, PA-C  pregabalin (LYRICA) 75 MG capsule Take 75 mg by mouth 2 (two) times daily. Patient not taking: Reported on 11/22/2021 05/20/20   [provider]  ferrous sulfate 325 (65 FE) MG tablet Take 1 tablet (325 mg total) by mouth 2 (two) times daily with a meal. Patient not taking: Reported on 03/19/2018 02/06/18  06/22/20  Bonnielee Haff, MD  linaclotide [facility]) 290 MCG CAPS capsule Take 1 capsule (290 mcg total) by mouth daily before breakfast. 03/26/18 06/22/20  Kerney Elbe, DO    Family History Family History  Problem Relation Age of Onset   Bowel Disease Mother    Diabetes Maternal Grandmother    Hypertension Maternal Aunt     Social History Social History   Tobacco Use   Smoking status: Every Day    Packs/day: 0.50    Years: 16.00    Pack years: 8.00    Types: Cigarettes   Smokeless tobacco: Never  Vaping Use   Vaping Use: Never used  Substance Use Topics   Alcohol use: No    Alcohol/week: 0.0 standard drinks   Drug use: No     Allergies   Patient has no known allergies.   Review of Systems Review of Systems  Constitutional:  Positive for activity change and appetite change. Negative for fatigue and fever.  Respiratory:  Negative for cough and shortness of breath.   Cardiovascular:  Negative for chest pain.  Gastrointestinal:  Positive for nausea and vomiting. Negative for abdominal pain, blood in stool, constipation and diarrhea.  Genitourinary:  Negative for dysuria, frequency, urgency, vaginal bleeding, vaginal discharge and vaginal pain.  Musculoskeletal:  Negative for arthralgias and myalgias.  Neurological:  Negative for dizziness, light-headedness and headaches.    Physical Exam Triage Vital Signs ED Triage Vitals  Enc Vitals Group     BP 11/22/21 1412 136/83     Pulse Rate 11/22/21 1412 61     Resp 11/22/21 1412 18     Temp 11/22/21 1412 99 F (37.2 C)     Temp Source 11/22/21 1412 Oral     SpO2 11/22/21 1412 98 %     Weight --      Height --      Head Circumference --      Peak Flow --      Pain Score 11/22/21 1409 0     Pain Loc --      Pain Edu? --      Excl. in [place]? --    No data found.  Updated Vital Signs BP 136/83 (BP Location: Right Arm)   Pulse 61   Temp 99 F (37.2 C) (Oral)   Resp 18   SpO2 98%   Visual Acuity Right  Eye Distance:   Left Eye Distance:   Bilateral Distance:    Right Eye Near:   Left Eye Near:    Bilateral Near:     Physical Exam Vitals reviewed.  Constitutional:  General: She is awake. She is not in acute distress.    Appearance: Normal appearance. She is well-developed. She is not ill-appearing.     Comments: Very pleasant female appears stated age in no acute distress sitting comfortably on exam room table  HENT:     Head: Normocephalic and atraumatic.  Cardiovascular:     Rate and Rhythm: Normal rate and regular rhythm.     Heart sounds: Normal heart sounds, S1 normal and S2 normal. No murmur heard. Pulmonary:     Effort: Pulmonary effort is normal.     Breath sounds: Normal breath sounds. No wheezing, rhonchi or rales.     Comments: Clear to auscultation bilaterally Abdominal:     General: Bowel sounds are normal.     Palpations: Abdomen is soft.     Tenderness: There is no abdominal tenderness. There is no right CVA tenderness, left CVA tenderness, guarding or rebound.     Comments: Benign abdominal exam  Psychiatric:        Behavior: Behavior is cooperative.     UC Treatments / Results  Labs (all labs ordered are listed, but only abnormal results are displayed) Labs Reviewed  POCT URINALYSIS DIPSTICK, ED / UC - Abnormal; Notable for the following components:      Result Value   Leukocytes,Ua TRACE (*)    All other components within normal limits  URINE CULTURE  CBC WITH DIFFERENTIAL/PLATELET  COMPREHENSIVE METABOLIC PANEL  POC URINE PREG, ED    EKG   Radiology No results found.  Procedures Procedures (including critical care time)  Medications Ordered in UC Medications  ondansetron (ZOFRAN-ODT) disintegrating tablet 4 mg (4 mg Oral Given 11/22/21 1442)    Initial Impression / Assessment and Plan / UC Course  I have reviewed the triage vital signs and the nursing notes.  Pertinent labs & imaging results that were available during my care of  the patient were reviewed by me and considered in my medical decision making (see chart for details).     Vital signs and physical exam are reassuring today; no indication for emergent evaluation or imaging.  Urine pregnancy was negative.  UA did show trace leukocyte esterase but patient denies any significant urinary symptoms so we will send this off for culture but defer antibiotics for the time being.  She was given Zofran in clinic and able to pass oral challenge with Coke and saltine crackers.  She was prescribed Zofran with instruction to use on a scheduled basis for the next several days to encourage oral intake and then decrease use to as needed thereafter.  Discussed that she should avoid spicy/acidic/fatty foods and drink plenty of fluid.  She was given contact information for local GI provider and encouraged to call to schedule appointment as soon as possible.  Discussed at length alarm symptoms that warrant emergent evaluation.  Strict return precautions given to which she expressed understanding.  Final Clinical Impressions(s) / UC Diagnoses   Final diagnoses:  Nausea and vomiting, unspecified vomiting type  Upset stomach     Discharge Instructions      We will contact you if your lab work is abnormal.  Please use Zofran every 8 hours as needed for nausea and vomiting.  Make sure you are drinking plenty of fluid and eat small frequent meals.  Avoid anything that spicy/acidic/fatty.  I recommend you follow-up with a stomach specialist so please call [place] [facility] tomorrow to schedule appointment as soon as possible.  As we discussed, if  you have any worsening symptoms including severe abdominal pain, nausea/vomiting interfering with oral intake, blood in your vomit, blood in your stool, inability to pass stool or gas you must go to the emergency room.     ED Prescriptions     Medication Sig Dispense Auth. Provider   ondansetron (ZOFRAN-ODT) 4 MG disintegrating tablet  Take 1 tablet (4 mg total) by mouth every 8 (eight) hours as needed for nausea or vomiting. 20 tablet Ka Flammer, Derry Skill, PA-C      PDMP not reviewed this encounter.   Terrilee Croak, PA-C 11/22/21 1544

## 2021-11-22 NOTE — Telephone Encounter (Signed)
Called and discussed laboratory results.  CBC was normal with no leukocytosis or significant anemia.  CMP showed hypokalemia with potassium of 3.1 likely related to nausea/vomiting.  Potassium supplement sent to pharmacy to be taken daily patient was encouraged to increase potassium rich foods.  Discussed that this will need to be repeated within a week and if she is unable to be seen by PCP in this timeframe she should return to our clinic for further evaluation.  Discussed that if she has persistent nausea/vomiting or decreased oral intake despite regular use of Zofran she should go to the emergency room.  All questions answered to her satisfaction.

## 2021-11-22 NOTE — Discharge Instructions (Signed)
We will contact you if your lab work is abnormal.  Please use Zofran every 8 hours as needed for nausea and vomiting.  Make sure you are drinking plenty of fluid and eat small frequent meals.  Avoid anything that spicy/acidic/fatty.  I recommend you follow-up with a stomach specialist so please call [place] [facility] tomorrow to schedule appointment as soon as possible.  As we discussed, if you have any worsening symptoms including severe abdominal pain, nausea/vomiting interfering with oral intake, blood in your vomit, blood in your stool, inability to pass stool or gas you must go to the emergency room.

## 2021-11-23 ENCOUNTER — Other Ambulatory Visit: Payer: Self-pay

## 2021-11-23 ENCOUNTER — Emergency Department (HOSPITAL_COMMUNITY): Payer: Medicaid Other

## 2021-11-23 ENCOUNTER — Encounter (HOSPITAL_COMMUNITY): Payer: Self-pay | Admitting: Emergency Medicine

## 2021-11-23 ENCOUNTER — Emergency Department (HOSPITAL_COMMUNITY)
Admission: EM | Admit: 2021-11-23 | Discharge: 2021-11-23 | Disposition: A | Discharge status: Home / Self Care | Payer: Medicaid Other | Attending: Emergency Medicine | Admitting: Emergency Medicine

## 2021-11-23 DIAGNOSIS — R112 Nausea with vomiting, unspecified: Secondary | ICD-10-CM | POA: Insufficient documentation

## 2021-11-23 DIAGNOSIS — R110 Nausea: Secondary | ICD-10-CM

## 2021-11-23 DIAGNOSIS — N9489 Other specified conditions associated with female genital organs and menstrual cycle: Secondary | ICD-10-CM | POA: Diagnosis not present

## 2021-11-23 DIAGNOSIS — Z789 Other specified health status: Secondary | ICD-10-CM

## 2021-11-23 DIAGNOSIS — F17210 Nicotine dependence, cigarettes, uncomplicated: Secondary | ICD-10-CM | POA: Insufficient documentation

## 2021-11-23 DIAGNOSIS — R1013 Epigastric pain: Secondary | ICD-10-CM | POA: Diagnosis not present

## 2021-11-23 LAB — CBC
HCT: 38.5 % (ref 36.0–46.0)
Hemoglobin: 12.1 g/dL (ref 12.0–15.0)
MCH: 29.5 pg (ref 26.0–34.0)
MCHC: 31.4 g/dL (ref 30.0–36.0)
MCV: 93.9 fL (ref 80.0–100.0)
Platelets: 237 10*3/uL (ref 150–400)
RBC: 4.1 MIL/uL (ref 3.87–5.11)
RDW: 13.5 % (ref 11.5–15.5)
WBC: 5.5 10*3/uL (ref 4.0–10.5)
nRBC: 0 % (ref 0.0–0.2)

## 2021-11-23 LAB — COMPREHENSIVE METABOLIC PANEL
ALT: 25 U/L (ref 0–44)
AST: 16 U/L (ref 15–41)
Albumin: 3.5 g/dL (ref 3.5–5.0)
Alkaline Phosphatase: 62 U/L (ref 38–126)
Anion gap: 7 (ref 5–15)
BUN: 6 mg/dL (ref 6–20)
CO2: 29 mmol/L (ref 22–32)
Calcium: 9.4 mg/dL (ref 8.9–10.3)
Chloride: 104 mmol/L (ref 98–111)
Creatinine, Ser: 0.91 mg/dL (ref 0.44–1.00)
GFR, Estimated: >60 mL/min (ref >60)
Glucose, Bld: 108 mg/dL — ABNORMAL HIGH (ref 70–99)
Potassium: 3.6 mmol/L (ref 3.5–5.1)
Sodium: 140 mmol/L (ref 135–145)
Total Bilirubin: 0.6 mg/dL (ref 0.3–1.2)
Total Protein: 6.9 g/dL (ref 6.5–8.1)

## 2021-11-23 LAB — I-STAT BETA HCG BLOOD, ED (MC, WL, AP ONLY): I-stat hCG, quantitative: <5 m[IU]/mL (ref <5)

## 2021-11-23 LAB — URINE CULTURE: Culture: <10000 — AB

## 2021-11-23 LAB — URINALYSIS, MICROSCOPIC (REFLEX)

## 2021-11-23 LAB — URINALYSIS, ROUTINE W REFLEX MICROSCOPIC
Bilirubin Urine: NEGATIVE
Glucose, UA: NEGATIVE mg/dL
Ketones, ur: NEGATIVE mg/dL
Leukocytes,Ua: NEGATIVE
Nitrite: NEGATIVE
Protein, ur: NEGATIVE mg/dL
Specific Gravity, Urine: <1.005 — ABNORMAL LOW (ref 1.005–1.030)
pH: 7 (ref 5.0–8.0)

## 2021-11-23 LAB — LIPASE, BLOOD: Lipase: 23 U/L (ref 11–51)

## 2021-11-23 MED ORDER — IOHEXOL 300 MG/ML  SOLN
100.0000 mL | Freq: Once | INTRAMUSCULAR | Status: AC | PRN
  Administered 2021-11-23: 100 mL via INTRAVENOUS

## 2021-11-23 MED ORDER — DIPHENHYDRAMINE HCL 50 MG/ML IJ SOLN
25.0000 mg | Freq: Once | INTRAMUSCULAR | Status: AC
  Administered 2021-11-23: 25 mg via INTRAVENOUS
  Filled 2021-11-23: qty 1

## 2021-11-23 MED ORDER — METOCLOPRAMIDE HCL 10 MG PO TABS: 10.0000 mg | ORAL_TABLET | Freq: Three times a day (TID) | ORAL | 0 refills | Status: DC | PRN

## 2021-11-23 MED ORDER — METOCLOPRAMIDE HCL 5 MG/ML IJ SOLN
10.0000 mg | Freq: Once | INTRAMUSCULAR | Status: AC
  Administered 2021-11-23: 10 mg via INTRAVENOUS
  Filled 2021-11-23: qty 2

## 2021-11-23 MED ORDER — DROPERIDOL 2.5 MG/ML IJ SOLN
2.5000 mg | Freq: Once | INTRAMUSCULAR | Status: AC
  Administered 2021-11-23: 2.5 mg via INTRAVENOUS
  Filled 2021-11-23: qty 2

## 2021-11-23 MED ORDER — SODIUM CHLORIDE 0.9 % IV BOLUS
500.0000 mL | Freq: Once | INTRAVENOUS | Status: AC
  Administered 2021-11-23: 500 mL via INTRAVENOUS

## 2021-11-23 NOTE — Discharge Instructions (Signed)
Avoid spicy, greasy, acidic foods.  Eat small amounts, starting with bland foods and slowly progress your diet as tolerated. Use either Reglan or Zofran as needed for nausea or vomiting. Make sure you are staying well-hydrated with water. Follow-up with your GI doctor as needed for further evaluation. Return to the emergency room if you develop high fevers, persistent vomiting despite medication, blood in your vomit, or any new, worsening, or concerning symptoms

## 2021-11-23 NOTE — ED Triage Notes (Signed)
C/o nausea and upper abd pain x 2 days.  Seen at [facility] yesterday and states taking Zofran without improvement.

## 2021-11-23 NOTE — ED Notes (Signed)
Pt provided discharge instructions and prescription information. Pt was given the opportunity to ask questions and questions were answered. Discharge signature not obtained in the setting of the COVID-19 pandemic in order to reduce high touch surfaces.  ° °

## 2021-11-23 NOTE — ED Provider Notes (Signed)
Emergency Medicine Provider Triage Evaluation Note  [name] , a 45 y.o. female  was evaluated in triage.  Pt complains of abdominal pain of 3-day duration with associated nausea vomiting.  Review of Systems  Positive: Abdominal pain, nausea, vomiting Negative: Fever, hematemesis, hematochezia, dysuria, vaginal discharge  Physical Exam  BP 127/83 (BP Location: Left Arm)    Pulse 84    Temp 98.4 F (36.9 C) (Oral)    Resp 14    SpO2 98%  Gen:   Awake, no distress   Resp:  Normal effort  MSK:   Moves extremities without difficulty  Other:    Medical Decision Making  Medically screening exam initiated at 12:55 PM.  Appropriate orders placed.  [name] was informed that the remainder of the evaluation will be completed by another provider, this initial triage assessment does not replace that evaluation, and the importance of remaining in the ED until their evaluation is complete.     Evlyn Courier, PA-C 11/23/21 1256    Daleen Bo, MD 11/23/21 1537

## 2021-11-23 NOTE — ED Notes (Signed)
Pt teaching provided on medications that may cause drowsiness. Pt instructed not to drive or operate heavy machinery while taking the prescribed medication. Pt verbalized understanding.   

## 2021-11-23 NOTE — ED Notes (Signed)
Mother Thurnell Garbe [phone] would like an update from the doctor asap

## 2021-11-23 NOTE — ED Provider Notes (Signed)
[facility] EMERGENCY DEPARTMENT Provider Note   CSN: [id number] Arrival date & time: 11/23/21  1212     History Chief Complaint  Patient presents with   Abdominal Pain    [name] is a 45 y.o. female presenting for evaluation of nausea, vomiting, abd pain.   Pt states she has had mid-abd pain for 4 days. Associated nausea and vomiting. Vomiting has improved, but she still has severe nausea. Pain is constant, not improved with her home percocet.  She was seen in urgent care yesterday, prescribed Zofran which has improved to the vomiting, but she still has nausea.  She has a history of previous small bowel obstruction, states this feels different.  Cause for obstruction is unknown.  She denies fevers, chest pain, shortness breath, cough, urinary symptoms, abnormal bowel movements.  No sick contacts.  Eating and drinking makes her symptoms worse, nothing makes it better.  Additional history taken chart reviewed.  History of anemia, fibroids, small bowel obstruction, chronic pain on opioids  HPI     Past Medical History:  Diagnosis Date   Anemia    Cervical disc disease    Fibroids    Heart murmur    Low back pain    Partial small bowel obstruction ([place]) 03/2018    Patient Active Problem List   Diagnosis Date Noted   Chronically on opiate therapy    Acute lower UTI 03/19/2018   Dysfunctional uterine bleeding 03/19/2018   Intra-abdominal abscess ([place]) 02/01/2018   Partial small bowel obstruction ([employer]) 02/29/2016   Recurrent abdominal pain 02/29/2016   Chronic anemia 02/29/2016   Chronic constipation 02/19/2016   Narcotic dependence ([place]) 02/19/2016   Ileus ([place]) 02/08/2016   Chronic narcotic use 02/08/2016   Anemia, iron deficiency    Small bowel obstruction ([place]) 01/29/2016   Hypokalemia 01/29/2016   Iron deficiency anemia due to chronic blood loss 01/29/2016   Anemia 01/29/2016   Abdominal pain 01/09/2016   Generalized abdominal pain  01/09/2016   Nausea with vomiting 01/09/2016   Tobacco abuse 01/09/2016   Abdominal pain in female    Constipation due to opioid therapy     Past Surgical History:  Procedure Laterality Date   ANKLE SURGERY     CESAREAN SECTION     TUBAL LIGATION       OB History     Gravida  4   Para  4   Term      Preterm  4   AB      Living  4      SAB      IAB      Ectopic      Multiple      Live Births              Family History  Problem Relation Age of Onset   Bowel Disease Mother    Diabetes Maternal Grandmother    Hypertension Maternal Aunt     Social History   Tobacco Use   Smoking status: Every Day    Packs/day: 0.50    Years: 16.00    Pack years: 8.00    Types: Cigarettes   Smokeless tobacco: Never  Vaping Use   Vaping Use: Never used  Substance Use Topics   Alcohol use: No    Alcohol/week: 0.0 standard drinks   Drug use: No    Home Medications Prior to Admission medications   Medication Sig Start Date End Date Taking? Authorizing Provider  ALPRAZolam (  XANAX) 0.25 MG tablet Take 0.25 mg by mouth daily as needed for anxiety. 05/26/20  Yes [provider]  baclofen (LIORESAL) 10 MG tablet Take 10 mg by mouth 3 (three) times daily as needed for muscle spasms. 06/03/20  Yes [provider]  cyclobenzaprine (FLEXERIL) 10 MG tablet Take 10 mg by mouth 3 (three) times daily as needed for muscle spasms. 10/23/21  Yes [provider]  Ibuprofen-diphenhydrAMINE Cit (ADVIL PM PO) Take 1 tablet by mouth once.   Yes [provider]  metoCLOPramide (REGLAN) 10 MG tablet Take 1 tablet (10 mg total) by mouth every 8 (eight) hours as needed for nausea. 11/23/21  Yes Ivon Oelkers, PA-C  naloxone (NARCAN) nasal spray 4 mg/0.1 mL 1 spray as directed. 10/24/21  Yes [provider]  ondansetron (ZOFRAN-ODT) 4 MG disintegrating tablet Take 1 tablet (4 mg total) by mouth every 8 (eight) hours as needed for nausea or  vomiting. 11/22/21  Yes Raspet, Erin K, PA-C  oxyCODONE-acetaminophen (PERCOCET) 10-325 MG tablet Take 1 tablet by mouth.   Yes [provider]  potassium chloride (KLOR-CON) 10 MEQ tablet Take 1 tablet (10 mEq total) by mouth daily. 11/22/21  Yes Raspet, Erin K, PA-C  pregabalin (LYRICA) 75 MG capsule Take 75 mg by mouth 2 (two) times daily. 05/20/20  Yes [provider]  Vitamin D, Ergocalciferol, (DRISDOL) 1.25 MG (50000 UNIT) CAPS capsule Take 50,000 Units by mouth once a week. 10/23/21  Yes [provider]  acetaminophen (TYLENOL) 325 MG tablet Take 2 tablets (650 mg total) by mouth every 6 (six) hours as needed for mild pain or moderate pain (or Fever >/= 101). Patient not taking: Reported on 11/23/2021 03/25/18   Raiford Noble Latif, DO  ferrous sulfate 325 (65 FE) MG tablet Take 1 tablet (325 mg total) by mouth 2 (two) times daily with a meal. Patient not taking: Reported on 03/19/2018 02/06/18 06/22/20  Bonnielee Haff, MD  linaclotide [facility]) 290 MCG CAPS capsule Take 1 capsule (290 mcg total) by mouth daily before breakfast. 03/26/18 06/22/20  Kerney Elbe, DO    Allergies    Patient has no known allergies.  Review of Systems   Review of Systems  Gastrointestinal:  Positive for abdominal pain, nausea and vomiting.  All other systems reviewed and are negative.  Physical Exam Updated Vital Signs BP 131/82 (BP Location: Left Arm)    Pulse 74    Temp 99.8 F (37.7 C) (Oral)    Resp 16    SpO2 97%   Physical Exam Vitals and nursing note reviewed.  Constitutional:      General: She is not in acute distress.    Appearance: Normal appearance.     Comments: nontoxic  HENT:     Head: Normocephalic and atraumatic.  Eyes:     Conjunctiva/sclera: Conjunctivae normal.     Pupils: Pupils are equal, round, and reactive to light.  Cardiovascular:     Rate and Rhythm: Normal rate and regular rhythm.     Pulses: Normal pulses.  Pulmonary:     Effort:  Pulmonary effort is normal. No respiratory distress.     Breath sounds: Normal breath sounds. No wheezing.     Comments: Speaking in full sentences.  Clear lung sounds in all fields. Abdominal:     General: There is no distension.     Palpations: Abdomen is soft. There is no mass.     Tenderness: There is abdominal tenderness. There is no guarding or rebound.  Comments: Mild diffuse ttp of the abd. No rigidity, guarding, distention. Negative rebound. No cva tenderness. No peritonitis. Negative murphys   Musculoskeletal:        General: Normal range of motion.     Cervical back: Normal range of motion and neck supple.  Skin:    General: Skin is warm and dry.     Capillary Refill: Capillary refill takes less than 2 seconds.  Neurological:     Mental Status: She is alert and oriented to person, place, and time.  Psychiatric:        Mood and Affect: Mood and affect normal.        Speech: Speech normal.        Behavior: Behavior normal.    ED Results / Procedures / Treatments   Labs (all labs ordered are listed, but only abnormal results are displayed) Labs Reviewed  COMPREHENSIVE METABOLIC PANEL - Abnormal; Notable for the following components:      Result Value   Glucose, Bld 108 (*)    All other components within normal limits  URINALYSIS, ROUTINE W REFLEX MICROSCOPIC - Abnormal; Notable for the following components:   Specific Gravity, Urine <1.005 (*)    Hgb urine dipstick TRACE (*)    All other components within normal limits  URINALYSIS, MICROSCOPIC (REFLEX) - Abnormal; Notable for the following components:   Bacteria, UA RARE (*)    All other components within normal limits  LIPASE, BLOOD  CBC  I-STAT BETA HCG BLOOD, ED ([facility], [facility], [facility] ONLY)    EKG None  Radiology CT ABDOMEN PELVIS W CONTRAST  Result Date: 11/23/2021 CLINICAL DATA:  Abdominal pain, acute, nonlocalized. Duration of symptoms 2 days. EXAM: CT ABDOMEN AND PELVIS WITH CONTRAST TECHNIQUE: Multidetector CT  imaging of the abdomen and pelvis was performed using the standard protocol following bolus administration of intravenous contrast. CONTRAST:  114mL OMNIPAQUE IOHEXOL 300 MG/ML  SOLN COMPARISON:  03/19/2018 FINDINGS: Lower chest: Lung bases are clear.  No pleural or pericardial fluid. Hepatobiliary: Mild diffuse fatty change of the liver. No focal lesion. No calcified gallstone. No ductal dilatation. Pancreas: Normal Spleen: Normal Adrenals/Urinary Tract: Adrenal glands are normal. Left kidney is normal. Right kidney is normal except for the presence of a 3.5 cm cyst. Bladder is normal. Stomach/Bowel: Stomach and small intestine are normal. No colon pathology is seen. No evidence of constipation, obstruction or focal lesion. Vascular/Lymphatic: Aorta and IVC are normal. Atherosclerotic calcification of the right common iliac artery is noted. No adenopathy. Reproductive: Normal.  No pelvic mass. Other: No free fluid or air.  No hernia. Musculoskeletal: Mild lower lumbar degenerative changes. IMPRESSION: No finding seen to explain acute discomfort. Mild diffuse fatty change of the liver. No acute bowel finding. 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 No aortic calcification is seen. Electronically Signed   By: Nelson Chimes M.D.   On: 11/23/2021 15:53   CT US GUIDE VASC ACCESS RT NO REPORT  Result Date: 11/23/2021 There is no Radiologist interpretation  for this exam.   Procedures Procedures   Medications Ordered in ED Medications  iohexol (OMNIPAQUE) 300 MG/ML solution 100 mL (100 mLs Intravenous Contrast Given 11/23/21 1545)  metoCLOPramide (REGLAN) injection 10 mg (10 mg Intravenous Given 11/23/21 1752)  diphenhydrAMINE (BENADRYL) injection 25 mg (25 mg Intravenous Given 11/23/21 1752)  sodium chloride 0.9 % bolus 500 mL (500 mLs Intravenous New Bag/Given 11/23/21 1748)  droperidol (INAPSINE) 2.5 MG/ML injection 2.5 mg (2.5 mg Intravenous Given 11/23/21 1838)  ED Course   I have reviewed the triage vital signs and the nursing notes.  Pertinent labs & imaging results that were available during my care of the patient were reviewed by me and considered in my medical decision making (see chart for details).    MDM Rules/Calculators/A&P                           Patient presented for evaluation of nausea and abdominal pain.  On exam, patient appears nontoxic.  She has mild diffuse tenderness palpation the abdomen, however easily distractible during abdominal exam.  Labs obtained in triage interpreted by me, overall reassuring.  Leukocytosis.  Electrolyte stable.  Kidney, liver, pancreatic function normal.  CT obtained from triage negative for acute findings.  Exam is not consistent with gallbladder disease, do not feel she needs an emergent ultrasound today.  Discussed possible PUD/gastritis/GERD.  Discussed possible viral cause.  Will treat symptomatically and reassess.  After symptomatic management, patient tolerating p.o. without difficulty.  Discussed with patient, patient's friend, patient's mom via the phone.  Patient to follow-up outpatient with her GI doctor.  At this time, patient appears safe for discharge.  Return precautions given.  Patient states she understands and agrees to plan  Final Clinical Impression(s) / ED Diagnoses Final diagnoses:  Epigastric pain  Nausea    Rx / DC Orders ED Discharge Orders          Ordered    metoCLOPramide (REGLAN) 10 MG tablet  Every 8 hours PRN        11/23/21 2025             Franchot Heidelberg, PA-C 11/23/21 2029    Truddie Hidden, MD 11/23/21 2153

## 2021-11-23 NOTE — ED Notes (Signed)
Pt provided with soda and instructed to call out if it makes her nauseous or vomits.

## 2022-07-13 ENCOUNTER — Encounter (HOSPITAL_COMMUNITY): Payer: Self-pay

## 2022-07-13 ENCOUNTER — Ambulatory Visit (HOSPITAL_COMMUNITY)
Admission: EM | Admit: 2022-07-13 | Discharge: 2022-07-13 | Disposition: A | Discharge status: Home / Self Care | Payer: Medicaid Other | Attending: Emergency Medicine | Admitting: Emergency Medicine

## 2022-07-13 DIAGNOSIS — M546 Pain in thoracic spine: Secondary | ICD-10-CM

## 2022-07-13 DIAGNOSIS — S20223A Contusion of bilateral back wall of thorax, initial encounter: Secondary | ICD-10-CM

## 2022-07-13 MED ORDER — DICLOFENAC SODIUM 50 MG PO TBEC: 50.0000 mg | DELAYED_RELEASE_TABLET | Freq: Two times a day (BID) | ORAL | 0 refills | Status: DC

## 2022-07-13 MED ORDER — BACLOFEN 10 MG PO TABS: 10.0000 mg | ORAL_TABLET | Freq: Three times a day (TID) | ORAL | 0 refills | Status: DC

## 2022-07-13 NOTE — ED Triage Notes (Signed)
Pt states her metal screen door fell landing on her back. C/o back pain all over, denies taking any meds.

## 2022-07-13 NOTE — Discharge Instructions (Signed)
Take diclofenac as prescribed to help with inflammation and pain. Take baclofen as prescribed as needed to help with muscle tightness and spasm. Heat and gentle massage or stretching can help as well. Follow-up with PCP or ortho if no improvement in a week. Go to the ER if develop difficulty breathing or incontinence.

## 2022-07-13 NOTE — ED Provider Notes (Signed)
[facility]    CSN: [id number] Arrival date & time: 07/13/22  1640      History   Chief Complaint Chief Complaint  Patient presents with   Back Pain    HPI [name] is a 46 y.o. female.   Patient presents with concerns of back pain. She states yesterday her screen door fell off the hinges and landed on her back. She reports diffuse back pain since then, worse with sitting for long periods as well as with movement. She has not taken or tried anything for the pain. She denies difficulty breathing, bowel/bladder changes, pain into the arms or legs, or numbness/tingling.   The history is provided by the patient.  Back Pain Associated symptoms: no abdominal pain, no chest pain, no numbness and no weakness     Past Medical History:  Diagnosis Date   Anemia    Cervical disc disease    Fibroids    Heart murmur    Low back pain    Partial small bowel obstruction ([place]) 03/2018    Patient Active Problem List   Diagnosis Date Noted   Chronically on opiate therapy    Acute lower UTI 03/19/2018   Dysfunctional uterine bleeding 03/19/2018   Intra-abdominal abscess ([place]) 02/01/2018   Partial small bowel obstruction ([place]) 02/29/2016   Recurrent abdominal pain 02/29/2016   Chronic anemia 02/29/2016   Chronic constipation 02/19/2016   Narcotic dependence ([place]) 02/19/2016   Ileus ([place]) 02/08/2016   Chronic narcotic use 02/08/2016   Anemia, iron deficiency    Small bowel obstruction ([place]) 01/29/2016   Hypokalemia 01/29/2016   Iron deficiency anemia due to chronic blood loss 01/29/2016   Anemia 01/29/2016   Abdominal pain 01/09/2016   Generalized abdominal pain 01/09/2016   Nausea with vomiting 01/09/2016   Tobacco abuse 01/09/2016   Abdominal pain in female    Constipation due to opioid therapy     Past Surgical History:  Procedure Laterality Date   ANKLE SURGERY     CESAREAN SECTION     TUBAL LIGATION      OB History     Gravida  4   Para  4    Term      Preterm  4   AB      Living  4      SAB      IAB      Ectopic      Multiple      Live Births               Home Medications    Prior to Admission medications   Medication Sig Start Date End Date Taking? Authorizing Provider  baclofen (LIORESAL) 10 MG tablet Take 1 tablet (10 mg total) by mouth 3 (three) times daily. 07/13/22  Yes Ac Colan L, PA  diclofenac (VOLTAREN) 50 MG EC tablet Take 1 tablet (50 mg total) by mouth 2 (two) times daily. 07/13/22  Yes Travon Crochet L, PA  acetaminophen (TYLENOL) 325 MG tablet Take 2 tablets (650 mg total) by mouth every 6 (six) hours as needed for mild pain or moderate pain (or Fever >/= 101). Patient not taking: Reported on 11/23/2021 03/25/18   Raiford Noble Latif, DO  ALPRAZolam Duanne Moron) 0.25 MG tablet Take 0.25 mg by mouth daily as needed for anxiety. 05/26/20   [provider]  cyclobenzaprine (FLEXERIL) 10 MG tablet Take 10 mg by mouth 3 (three) times daily as needed for muscle spasms. 10/23/21  [provider]  Ibuprofen-diphenhydrAMINE Cit (ADVIL PM PO) Take 1 tablet by mouth once.    [provider]  naloxone [facility]) nasal spray 4 mg/0.1 mL 1 spray as directed. 10/24/21   [provider]  oxyCODONE-acetaminophen (PERCOCET) 10-325 MG tablet Take 1 tablet by mouth.    [provider]  pregabalin (LYRICA) 75 MG capsule Take 75 mg by mouth 2 (two) times daily. 05/20/20   [provider]  Vitamin D, Ergocalciferol, (DRISDOL) 1.25 MG (50000 UNIT) CAPS capsule Take 50,000 Units by mouth once a week. 10/23/21   [provider]  ferrous sulfate 325 (65 FE) MG tablet Take 1 tablet (325 mg total) by mouth 2 (two) times daily with a meal. Patient not taking: Reported on 03/19/2018 02/06/18 06/22/20  Bonnielee Haff, MD  linaclotide [facility]) 290 MCG CAPS capsule Take 1 capsule (290 mcg total) by mouth daily before breakfast. 03/26/18 06/22/20  Kerney Elbe, DO    Family  History Family History  Problem Relation Age of Onset   Bowel Disease Mother    Diabetes Maternal Grandmother    Hypertension Maternal Aunt     Social History Social History   Tobacco Use   Smoking status: Every Day    Packs/day: 0.50    Years: 16.00    Total pack years: 8.00    Types: Cigarettes   Smokeless tobacco: Never  Vaping Use   Vaping Use: Never used  Substance Use Topics   Alcohol use: No    Alcohol/week: 0.0 standard drinks of alcohol   Drug use: No     Allergies   Patient has no known allergies.   Review of Systems Review of Systems  Respiratory:  Negative for shortness of breath.   Cardiovascular:  Negative for chest pain.  Gastrointestinal:  Negative for abdominal pain, nausea and vomiting.  Genitourinary:  Negative for difficulty urinating.  Musculoskeletal:  Positive for back pain.  Skin:  Negative for rash.  Neurological:  Negative for dizziness, weakness and numbness.     Physical Exam Triage Vital Signs ED Triage Vitals [07/13/22 1733]  Enc Vitals Group     BP 128/64     Pulse Rate 74     Resp 18     Temp 98.8 F (37.1 C)     Temp Source Oral     SpO2 96 %     Weight      Height      Head Circumference      Peak Flow      Pain Score 9     Pain Loc      Pain Edu?      Excl. in [place]?    No data found.  Updated Vital Signs BP 128/64 (BP Location: Left Arm)   Pulse 74   Temp 98.8 F (37.1 C) (Oral)   Resp 18   SpO2 96%   Visual Acuity Right Eye Distance:   Left Eye Distance:   Bilateral Distance:    Right Eye Near:   Left Eye Near:    Bilateral Near:     Physical Exam Vitals and nursing note reviewed.  Constitutional:      General: She is not in acute distress. HENT:     Head: Normocephalic.  Eyes:     Pupils: Pupils are equal, round, and reactive to light.  Cardiovascular:     Rate and Rhythm: Normal rate and regular rhythm.     Heart sounds: Normal heart sounds.  Pulmonary:  Effort: Pulmonary effort is  normal.     Breath sounds: Normal breath sounds.  Musculoskeletal:     Cervical back: No tenderness. Normal range of motion.     Thoracic back: Spasms and tenderness present. Decreased range of motion.     Lumbar back: No tenderness. Negative right straight leg raise test and negative left straight leg raise test.     Comments: Diffuse tenderness to entire thoracic region including midline, bilateral paraspinals, and laterally. Spasms noted. Decreased ROM due to pain. No lumbar or cervical tenderness.   Skin:    Findings: No bruising, erythema or rash.  Neurological:     Mental Status: She is alert.     Gait: Gait abnormal (mildly antalgic forward flexion).  Psychiatric:        Mood and Affect: Mood normal.      UC Treatments / Results  Labs (all labs ordered are listed, but only abnormal results are displayed) Labs Reviewed - No data to display  EKG   Radiology No results found.  Procedures Procedures (including critical care time)  Medications Ordered in UC Medications - No data to display  Initial Impression / Assessment and Plan / UC Course  I have reviewed the triage vital signs and the nursing notes.  Pertinent labs & imaging results that were available during my care of the patient were reviewed by me and considered in my medical decision making (see chart for details).     Thoracic contusion with secondary spasms, no evidence of possible fx as no point midline tenderness and mechanism unlikely to have enough force to cause fx. Sx tx and reassurance.   E/M: 1 acute uncomplicated illness, no data, moderate risk due to prescription management  Final Clinical Impressions(s) / UC Diagnoses   Final diagnoses:  Acute bilateral thoracic back pain  Contusion of both sides of back wall of thorax, initial encounter     Discharge Instructions      Take diclofenac as prescribed to help with inflammation and pain. Take baclofen as prescribed as needed to help with  muscle tightness and spasm. Heat and gentle massage or stretching can help as well. Follow-up with PCP or ortho if no improvement in a week. Go to the ER if develop difficulty breathing or incontinence.     ED Prescriptions     Medication Sig Dispense Auth. Provider   diclofenac (VOLTAREN) 50 MG EC tablet Take 1 tablet (50 mg total) by mouth 2 (two) times daily. 10 tablet Abner Greenspan, Edder Bellanca L, PA   baclofen (LIORESAL) 10 MG tablet Take 1 tablet (10 mg total) by mouth 3 (three) times daily. 30 each Abner Greenspan, Taite Baldassari L, PA      PDMP not reviewed this encounter.   Delsa Sale, [place] 07/13/22 1801

## 2022-09-29 ENCOUNTER — Encounter (HOSPITAL_COMMUNITY): Payer: Self-pay

## 2022-09-29 ENCOUNTER — Ambulatory Visit (HOSPITAL_COMMUNITY)
Admission: EM | Admit: 2022-09-29 | Discharge: 2022-09-29 | Disposition: A | Discharge status: Home / Self Care | Payer: Medicaid Other | Attending: Internal Medicine | Admitting: Internal Medicine

## 2022-09-29 DIAGNOSIS — A059 Bacterial foodborne intoxication, unspecified: Secondary | ICD-10-CM

## 2022-09-29 LAB — COMPREHENSIVE METABOLIC PANEL
ALT: 20 U/L (ref 0–44)
AST: 19 U/L (ref 15–41)
Albumin: 4.3 g/dL (ref 3.5–5.0)
Alkaline Phosphatase: 70 U/L (ref 38–126)
Anion gap: 9 (ref 5–15)
BUN: 8 mg/dL (ref 6–20)
CO2: 26 mmol/L (ref 22–32)
Calcium: 9.8 mg/dL (ref 8.9–10.3)
Chloride: 104 mmol/L (ref 98–111)
Creatinine, Ser: 0.95 mg/dL (ref 0.44–1.00)
GFR, Estimated: >60 mL/min (ref >60)
Glucose, Bld: 111 mg/dL — ABNORMAL HIGH (ref 70–99)
Potassium: 3.8 mmol/L (ref 3.5–5.1)
Sodium: 139 mmol/L (ref 135–145)
Total Bilirubin: 0.7 mg/dL (ref 0.3–1.2)
Total Protein: 7.6 g/dL (ref 6.5–8.1)

## 2022-09-29 MED ORDER — ONDANSETRON 4 MG PO TBDP
ORAL_TABLET | ORAL | Status: AC
  Filled 2022-09-29: qty 1

## 2022-09-29 MED ORDER — ONDANSETRON 4 MG PO TBDP: 4.0000 mg | ORAL_TABLET | Freq: Three times a day (TID) | ORAL | 0 refills | Status: DC | PRN

## 2022-09-29 MED ORDER — ONDANSETRON HCL 4 MG/2ML IJ SOLN
4.0000 mg | Freq: Once | INTRAMUSCULAR | Status: DC
Start: 2022-09-29 — End: 2022-09-29

## 2022-09-29 MED ORDER — PANTOPRAZOLE SODIUM 20 MG PO TBEC: 20.0000 mg | DELAYED_RELEASE_TABLET | Freq: Every day | ORAL | 0 refills | Status: DC

## 2022-09-29 MED ORDER — SODIUM CHLORIDE 0.9 % IV BOLUS: 1000.0000 mL | Freq: Once | INTRAVENOUS | Status: DC

## 2022-09-29 MED ORDER — ONDANSETRON 4 MG PO TBDP
4.0000 mg | ORAL_TABLET | Freq: Once | ORAL | Status: DC
Start: 2022-09-29 — End: 2022-09-29

## 2022-09-29 MED ORDER — ONDANSETRON 4 MG PO TBDP
4.0000 mg | ORAL_TABLET | Freq: Once | ORAL | Status: AC
  Administered 2022-09-29: 4 mg via ORAL

## 2022-09-29 NOTE — ED Provider Notes (Signed)
[place]    CSN: [id number] Arrival date & time: 09/29/22  1037      History   Chief Complaint Chief Complaint  Patient presents with   Emesis   Sore Throat    HPI [name] is a 46 y.o. female comes to urgent care with nonbloody nonbilious vomitus, sore throat and epigastric abdominal pain of 1 day duration.  Patient ate some chitlings last night and soon after started experiencing nausea with vomiting and epigastric abdominal pain.  Patient has vomited several times over the past 12hours.  She endorses feeling generally weak.  She denies any abdominal distention or diarrhea.  No fever or chills.  She denies any sick contacts.  No headaches, dizziness, near syncope or syncopal episodes.  No yellowing of her eyes.   HPI  Past Medical History:  Diagnosis Date   Anemia    Cervical disc disease    Fibroids    Heart murmur    Low back pain    Partial small bowel obstruction ([place]) 03/2018    Patient Active Problem List   Diagnosis Date Noted   Chronically on opiate therapy    Acute lower UTI 03/19/2018   Dysfunctional uterine bleeding 03/19/2018   Intra-abdominal abscess ([place]) 02/01/2018   Partial small bowel obstruction ([place]) 02/29/2016   Recurrent abdominal pain 02/29/2016   Chronic anemia 02/29/2016   Chronic constipation 02/19/2016   Narcotic dependence ([place]) 02/19/2016   Ileus ([place]) 02/08/2016   Chronic narcotic use 02/08/2016   Anemia, iron deficiency    Small bowel obstruction ([place]) 01/29/2016   Hypokalemia 01/29/2016   Iron deficiency anemia due to chronic blood loss 01/29/2016   Anemia 01/29/2016   Abdominal pain 01/09/2016   Generalized abdominal pain 01/09/2016   Nausea with vomiting 01/09/2016   Tobacco abuse 01/09/2016   Abdominal pain in female    Constipation due to opioid therapy     Past Surgical History:  Procedure Laterality Date   ANKLE SURGERY     CESAREAN SECTION     TUBAL LIGATION      OB History     Gravida  4    Para  4   Term      Preterm  4   AB      Living  4      SAB      IAB      Ectopic      Multiple      Live Births               Home Medications    Prior to Admission medications   Medication Sig Start Date End Date Taking? Authorizing Provider  ondansetron (ZOFRAN-ODT) 4 MG disintegrating tablet Take 1 tablet (4 mg total) by mouth every 8 (eight) hours as needed for nausea or vomiting. 09/29/22  Yes Maleke Feria, Myrene Galas, MD  oxyCODONE-acetaminophen (PERCOCET) 10-325 MG tablet Take 1 tablet by mouth.   Yes [provider]  pantoprazole (PROTONIX) 20 MG tablet Take 1 tablet (20 mg total) by mouth daily. 09/29/22  Yes Lashala Laser, Myrene Galas, MD  pregabalin (LYRICA) 75 MG capsule Take 75 mg by mouth 2 (two) times daily. 05/20/20  Yes [provider]  ALPRAZolam Duanne Moron) 0.25 MG tablet Take 0.25 mg by mouth daily as needed for anxiety. 05/26/20   [provider]  baclofen (LIORESAL) 10 MG tablet Take 1 tablet (10 mg total) by mouth 3 (three) times daily. 07/13/22   Delsa Sale, PA  cyclobenzaprine (FLEXERIL) 10 MG tablet Take 10 mg by mouth 3 (three) times daily as needed for muscle spasms. 10/23/21   [provider]  diclofenac (VOLTAREN) 50 MG EC tablet Take 1 tablet (50 mg total) by mouth 2 (two) times daily. 07/13/22   Abner Greenspan, Amy L, PA  Ibuprofen-diphenhydrAMINE Cit (ADVIL PM PO) Take 1 tablet by mouth once.    [provider]  naloxone [facility]) nasal spray 4 mg/0.1 mL 1 spray as directed. 10/24/21   [provider]  Vitamin D, Ergocalciferol, (DRISDOL) 1.25 MG (50000 UNIT) CAPS capsule Take 50,000 Units by mouth once a week. 10/23/21   [provider]  ferrous sulfate 325 (65 FE) MG tablet Take 1 tablet (325 mg total) by mouth 2 (two) times daily with a meal. Patient not taking: Reported on 03/19/2018 02/06/18 06/22/20  Bonnielee Haff, MD  linaclotide [facility]) 290 MCG CAPS capsule Take 1 capsule (290 mcg total) by mouth  daily before breakfast. 03/26/18 06/22/20  Kerney Elbe, DO    Family History Family History  Problem Relation Age of Onset   Bowel Disease Mother    Diabetes Maternal Grandmother    Hypertension Maternal Aunt     Social History Social History   Tobacco Use   Smoking status: Every Day    Packs/day: 0.50    Years: 16.00    Total pack years: 8.00    Types: Cigarettes   Smokeless tobacco: Never  Vaping Use   Vaping Use: Never used  Substance Use Topics   Alcohol use: No    Alcohol/week: 0.0 standard drinks of alcohol   Drug use: No     Allergies   Patient has no known allergies.   Review of Systems Review of Systems  Constitutional: Negative.   HENT:  Positive for sore throat.   Cardiovascular: Negative.   Gastrointestinal:  Positive for abdominal pain, nausea and vomiting. Negative for diarrhea.  Genitourinary: Negative.   Neurological: Negative.      Physical Exam Triage Vital Signs ED Triage Vitals  Enc Vitals Group     BP 09/29/22 1159 (!) 148/78     Pulse Rate 09/29/22 1159 70     Resp 09/29/22 1159 16     Temp 09/29/22 1159 99.9 F (37.7 C)     Temp Source 09/29/22 1159 Oral     SpO2 09/29/22 1159 100 %     Weight --      Height --      Head Circumference --      Peak Flow --      Pain Score 09/29/22 1158 9     Pain Loc --      Pain Edu? --      Excl. in [place]? --    No data found.  Updated Vital Signs BP (!) 148/78 (BP Location: Left Arm)   Pulse 70   Temp 99.9 F (37.7 C) (Oral)   Resp 16   SpO2 100%   Visual Acuity Right Eye Distance:   Left Eye Distance:   Bilateral Distance:    Right Eye Near:   Left Eye Near:    Bilateral Near:     Physical Exam Vitals and nursing note reviewed.  Constitutional:      General: She is not in acute distress.    Appearance: She is ill-appearing.  HENT:     Right Ear: Tympanic membrane normal.     Left Ear: Tympanic membrane normal.     Mouth/Throat:  Mouth: Mucous membranes are  moist. Mucous membranes are pale.     Pharynx: No posterior oropharyngeal erythema.     Tonsils: No tonsillar exudate or tonsillar abscesses.  Cardiovascular:     Rate and Rhythm: Normal rate and regular rhythm.  Pulmonary:     Effort: Pulmonary effort is normal.     Breath sounds: Normal breath sounds.  Abdominal:     General: Bowel sounds are normal.     Palpations: Abdomen is soft.     Tenderness: There is no abdominal tenderness. There is no guarding.  Skin:    General: Skin is warm.  Neurological:     Mental Status: She is alert.      UC Treatments / Results  Labs (all labs ordered are listed, but only abnormal results are displayed) Labs Reviewed  COMPREHENSIVE METABOLIC PANEL - Abnormal; Notable for the following components:      Result Value   Glucose, Bld 111 (*)    All other components within normal limits  CBC WITH DIFFERENTIAL/PLATELET    EKG   Radiology No results found.  Procedures Procedures (including critical care time)  Medications Ordered in UC Medications  ondansetron (ZOFRAN-ODT) disintegrating tablet 4 mg (4 mg Oral Given 09/29/22 1243)    Initial Impression / Assessment and Plan / UC Course  I have reviewed the triage vital signs and the nursing notes.  Pertinent labs & imaging results that were available during my care of the patient were reviewed by me and considered in my medical decision making (see chart for details).     1.  Food poisoning with persistent vomiting: Zofran ODT 4 mg x 1 dose Trial of oral fluids with successful CBC, CMP Protonix 20 mg orally daily for 10 days We will call patient with recommendations if labs are abnormal Return precautions given. Final Clinical Impressions(s) / UC Diagnoses   Final diagnoses:  Food poisoning     Discharge Instructions      Increase oral fluid intake Take medications as prescribed Strict hand hygiene We will call you with recommendations if labs are abnormal Return to  urgent care if symptoms worsens.   ED Prescriptions     Medication Sig Dispense Auth. Provider   ondansetron (ZOFRAN-ODT) 4 MG disintegrating tablet Take 1 tablet (4 mg total) by mouth every 8 (eight) hours as needed for nausea or vomiting. 20 tablet Myya Meenach, Myrene Galas, MD   pantoprazole (PROTONIX) 20 MG tablet Take 1 tablet (20 mg total) by mouth daily. 10 tablet Tavius Turgeon, Myrene Galas, MD      PDMP not reviewed this encounter.   Chase Picket, MD 09/29/22 1356

## 2022-09-29 NOTE — ED Notes (Signed)
Dr Lanny Cramp made aware of unsuccessful attempts to obtain iv.  Patient reports she usually requires iv team when seen in ED
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
## 2022-09-29 NOTE — Discharge Instructions (Addendum)
Increase oral fluid intake Take medications as prescribed Strict hand hygiene We will call you with recommendations if labs are abnormal Return to urgent care if symptoms worsens.

## 2022-09-30 ENCOUNTER — Other Ambulatory Visit: Payer: Self-pay

## 2022-09-30 ENCOUNTER — Emergency Department (HOSPITAL_BASED_OUTPATIENT_CLINIC_OR_DEPARTMENT_OTHER): Payer: Medicaid Other

## 2022-09-30 ENCOUNTER — Emergency Department (HOSPITAL_BASED_OUTPATIENT_CLINIC_OR_DEPARTMENT_OTHER)
Admission: EM | Admit: 2022-09-30 | Discharge: 2022-09-30 | Disposition: A | Discharge status: Home / Self Care | Payer: Medicaid Other | Attending: Emergency Medicine | Admitting: Emergency Medicine

## 2022-09-30 ENCOUNTER — Encounter (HOSPITAL_BASED_OUTPATIENT_CLINIC_OR_DEPARTMENT_OTHER): Payer: Self-pay | Admitting: Emergency Medicine

## 2022-09-30 DIAGNOSIS — R112 Nausea with vomiting, unspecified: Secondary | ICD-10-CM | POA: Insufficient documentation

## 2022-09-30 DIAGNOSIS — R10817 Generalized abdominal tenderness: Secondary | ICD-10-CM | POA: Diagnosis not present

## 2022-09-30 LAB — URINALYSIS, ROUTINE W REFLEX MICROSCOPIC
Bilirubin Urine: NEGATIVE
Glucose, UA: NEGATIVE mg/dL
Ketones, ur: NEGATIVE mg/dL
Nitrite: NEGATIVE
Protein, ur: NEGATIVE mg/dL
Specific Gravity, Urine: 1.017 (ref 1.005–1.030)
pH: 6 (ref 5.0–8.0)

## 2022-09-30 LAB — COMPREHENSIVE METABOLIC PANEL
ALT: 11 U/L (ref 0–44)
AST: 9 U/L — ABNORMAL LOW (ref 15–41)
Albumin: 4 g/dL (ref 3.5–5.0)
Alkaline Phosphatase: 49 U/L (ref 38–126)
Anion gap: 8 (ref 5–15)
BUN: 9 mg/dL (ref 6–20)
CO2: 24 mmol/L (ref 22–32)
Calcium: 8.9 mg/dL (ref 8.9–10.3)
Chloride: 106 mmol/L (ref 98–111)
Creatinine, Ser: 0.86 mg/dL (ref 0.44–1.00)
GFR, Estimated: >60 mL/min (ref >60)
Glucose, Bld: 99 mg/dL (ref 70–99)
Potassium: 3.5 mmol/L (ref 3.5–5.1)
Sodium: 138 mmol/L (ref 135–145)
Total Bilirubin: 0.6 mg/dL (ref 0.3–1.2)
Total Protein: 6.6 g/dL (ref 6.5–8.1)

## 2022-09-30 LAB — CBC
HCT: 35.7 % — ABNORMAL LOW (ref 36.0–46.0)
Hemoglobin: 11.8 g/dL — ABNORMAL LOW (ref 12.0–15.0)
MCH: 30.5 pg (ref 26.0–34.0)
MCHC: 33.1 g/dL (ref 30.0–36.0)
MCV: 92.2 fL (ref 80.0–100.0)
Platelets: 193 10*3/uL (ref 150–400)
RBC: 3.87 MIL/uL (ref 3.87–5.11)
RDW: 12.8 % (ref 11.5–15.5)
WBC: 7 10*3/uL (ref 4.0–10.5)
nRBC: 0 % (ref 0.0–0.2)

## 2022-09-30 LAB — PREGNANCY, URINE: Preg Test, Ur: NEGATIVE

## 2022-09-30 LAB — LIPASE, BLOOD: Lipase: <10 U/L — ABNORMAL LOW (ref 11–51)

## 2022-09-30 MED ORDER — MORPHINE SULFATE (PF) 2 MG/ML IV SOLN
2.0000 mg | Freq: Once | INTRAVENOUS | Status: AC
  Administered 2022-09-30: 2 mg via INTRAVENOUS
  Filled 2022-09-30: qty 1

## 2022-09-30 MED ORDER — IOHEXOL 300 MG/ML  SOLN
100.0000 mL | Freq: Once | INTRAMUSCULAR | Status: AC | PRN
  Administered 2022-09-30: 80 mL via INTRAVENOUS

## 2022-09-30 MED ORDER — PROMETHAZINE HCL 25 MG RE SUPP: 25.0000 mg | Freq: Four times a day (QID) | RECTAL | 0 refills | Status: DC | PRN

## 2022-09-30 MED ORDER — ONDANSETRON HCL 4 MG/2ML IJ SOLN
4.0000 mg | Freq: Once | INTRAMUSCULAR | Status: AC
  Administered 2022-09-30: 4 mg via INTRAVENOUS
  Filled 2022-09-30: qty 2

## 2022-09-30 MED ORDER — MORPHINE SULFATE (PF) 4 MG/ML IV SOLN
4.0000 mg | Freq: Once | INTRAVENOUS | Status: AC
  Administered 2022-09-30: 4 mg via INTRAVENOUS
  Filled 2022-09-30: qty 1

## 2022-09-30 MED ORDER — SODIUM CHLORIDE 0.9 % IV BOLUS
1000.0000 mL | Freq: Once | INTRAVENOUS | Status: AC
  Administered 2022-09-30: 1000 mL via INTRAVENOUS

## 2022-09-30 NOTE — Discharge Instructions (Addendum)
You were seen today for nausea, vomiting, abdominal pain.  Your CT scan was reassuring for no acute condition in your abdomen at this time.  This is likely either food poisoning or probably a stomach virus.  This will take some time to pass.  Please drink water if you are able.  I have prescribed Phenergan suppositories to be used as directed to help with your nausea.  I recommend following up with your primary care provider for further evaluation and management.  If you develop any life-threatening symptoms please return to the emergency department

## 2022-09-30 NOTE — ED Triage Notes (Signed)
Lower Abdominal pain and n/v/ No diarrhea. Started 2 days ago after eating on Wednesday. Has not been able to keep anything down  Was seen at [facility] yesterday, dx food poisoning, sent home with zofran, no relief Apears in distress in triage

## 2022-09-30 NOTE — ED Provider Notes (Signed)
[place] EMERGENCY DEPT Provider Note   CSN: [id number] Arrival date & time: 09/30/22  1410     History  Chief Complaint  Patient presents with   Abdominal Pain    [name] is a 46 y.o. female.  Patient presents to the hospital complaining generalized abdominal pain which began on Wednesday.  Patient states pain began shortly after eating some chitterlings.  Patient also endorses frequent episodes of emesis along with nausea.  [name] denies any diarrhea.  States [name] has been unable to keep any food down since Wednesday.  [name] was seen yesterday at an urgent care where they diagnosed her with likely food poisoning and prescribed her Zofran.  Patient states that Zofran is provided no relief at home.  Patient denies abdominal pain, shortness of breath, chest pain, headache, urinary symptoms.  Past medical history significant for history of partial small bowel obstruction, fibroids, previous intra-abdominal abscess, previous ileus, history of narcotic dependence  HPI     Home Medications Prior to Admission medications   Medication Sig Start Date End Date Taking? Authorizing Provider  promethazine (PHENERGAN) 25 MG suppository Place 1 suppository (25 mg total) rectally every 6 (six) hours as needed for nausea or vomiting. 09/30/22  Yes Dorothyann Peng, PA-C  ALPRAZolam Duanne Moron) 0.25 MG tablet Take 0.25 mg by mouth daily as needed for anxiety. 05/26/20   [provider]  baclofen (LIORESAL) 10 MG tablet Take 1 tablet (10 mg total) by mouth 3 (three) times daily. 07/13/22   Abner Greenspan, Amy L, PA  cyclobenzaprine (FLEXERIL) 10 MG tablet Take 10 mg by mouth 3 (three) times daily as needed for muscle spasms. 10/23/21   [provider]  diclofenac (VOLTAREN) 50 MG EC tablet Take 1 tablet (50 mg total) by mouth 2 (two) times daily. 07/13/22   Abner Greenspan, Amy L, PA  Ibuprofen-diphenhydrAMINE Cit (ADVIL PM PO) Take 1 tablet by mouth once.    [provider]   naloxone [facility]) nasal spray 4 mg/0.1 mL 1 spray as directed. 10/24/21   [provider]  ondansetron (ZOFRAN-ODT) 4 MG disintegrating tablet Take 1 tablet (4 mg total) by mouth every 8 (eight) hours as needed for nausea or vomiting. 09/29/22   Chase Picket, MD  oxyCODONE-acetaminophen (PERCOCET) 10-325 MG tablet Take 1 tablet by mouth.    [provider]  pantoprazole (PROTONIX) 20 MG tablet Take 1 tablet (20 mg total) by mouth daily. 09/29/22   LampteyMyrene Galas, MD  pregabalin (LYRICA) 75 MG capsule Take 75 mg by mouth 2 (two) times daily. 05/20/20   [provider]  Vitamin D, Ergocalciferol, (DRISDOL) 1.25 MG (50000 UNIT) CAPS capsule Take 50,000 Units by mouth once a week. 10/23/21   [provider]  ferrous sulfate 325 (65 FE) MG tablet Take 1 tablet (325 mg total) by mouth 2 (two) times daily with a meal. Patient not taking: Reported on 03/19/2018 02/06/18 06/22/20  Bonnielee Haff, MD  linaclotide [facility]) 290 MCG CAPS capsule Take 1 capsule (290 mcg total) by mouth daily before breakfast. 03/26/18 06/22/20  Kerney Elbe, DO      Allergies    Patient has no known allergies.    Review of Systems   Review of Systems  Constitutional:  Negative for fever.  Respiratory:  Negative for shortness of breath.   Cardiovascular:  Negative for chest pain.  Gastrointestinal:  Positive for abdominal pain, nausea and vomiting. Negative for diarrhea.  Genitourinary:  Negative for dysuria.    Physical  Exam Updated Vital Signs BP (!) 156/95   Pulse (!) 55   Temp 98.8 F (37.1 C) (Oral)   Resp 18   LMP 05/21/2021   SpO2 99%  Physical Exam Vitals and nursing note reviewed.  Constitutional:      Appearance: [name] is well-developed.  HENT:     Head: Normocephalic and atraumatic.  Eyes:     Conjunctiva/sclera: Conjunctivae normal.  Cardiovascular:     Rate and Rhythm: Normal rate and regular rhythm.     Heart sounds: No murmur heard. Pulmonary:      Effort: Pulmonary effort is normal. No respiratory distress.     Breath sounds: Normal breath sounds.  Abdominal:     General: Abdomen is flat.     Palpations: Abdomen is soft.     Tenderness: There is generalized abdominal tenderness.  Musculoskeletal:        General: No swelling.     Cervical back: Neck supple.  Skin:    General: Skin is warm and dry.     Capillary Refill: Capillary refill takes less than 2 seconds.  Neurological:     Mental Status: [name] is alert.  Psychiatric:        Mood and Affect: Mood normal.     ED Results / Procedures / Treatments   Labs (all labs ordered are listed, but only abnormal results are displayed) Labs Reviewed  URINALYSIS, ROUTINE W REFLEX MICROSCOPIC - Abnormal; Notable for the following components:      Result Value   Hgb urine dipstick SMALL (*)    Leukocytes,Ua TRACE (*)    All other components within normal limits  CBC - Abnormal; Notable for the following components:   Hemoglobin 11.8 (*)    HCT 35.7 (*)    All other components within normal limits  COMPREHENSIVE METABOLIC PANEL - Abnormal; Notable for the following components:   AST 9 (*)    All other components within normal limits  LIPASE, BLOOD - Abnormal; Notable for the following components:   Lipase <10 (*)    All other components within normal limits  PREGNANCY, URINE    EKG None  Radiology CT Abdomen Pelvis W Contrast  Result Date: 09/30/2022 CLINICAL DATA:  Lower abdominal pain with nausea and vomiting EXAM: CT ABDOMEN AND PELVIS WITH CONTRAST TECHNIQUE: Multidetector CT imaging of the abdomen and pelvis was performed using the standard protocol following bolus administration of intravenous contrast. RADIATION DOSE REDUCTION: This exam was performed according to the departmental dose-optimization program which includes automated exposure control, adjustment of the mA and/or kV according to patient size and/or use of iterative reconstruction technique. CONTRAST:   51m OMNIPAQUE IOHEXOL 300 MG/ML  SOLN COMPARISON:  CT abdomen and pelvis 11/23/2021 FINDINGS: Lower chest: No acute abnormality. Hepatobiliary: No suspicious focal liver abnormality is seen. No gallstones, gallbladder wall thickening, or biliary dilatation. Pancreas: Unremarkable. No pancreatic ductal dilatation or surrounding inflammatory changes. Spleen: Normal in size without focal abnormality. Adrenals/Urinary Tract: Adrenal glands are unremarkable. Kidneys are normal, without renal calculi, suspicious focal lesion, or hydronephrosis. Stable simple appearing right renal cyst not requiring follow-up. Bladder is unremarkable for degree of distention. Stomach/Bowel: Stomach is within normal limits. No evidence of bowel wall thickening, distention, or inflammatory changes. The appendix measures 7 mm in diameter without adjacent inflammatory change. Vascular/Lymphatic: No significant vascular findings are present. No enlarged abdominal or pelvic lymph nodes. Reproductive: Unremarkable. Other: No free intraperitoneal fluid or air. Musculoskeletal: No acute or significant osseous findings. IMPRESSION: No acute abnormality  in the abdomen or pelvis. Electronically Signed   By: Placido Sou M.D.   On: 09/30/2022 19:26    Procedures Procedures    Medications Ordered in ED Medications  morphine (PF) 4 MG/ML injection 4 mg (4 mg Intravenous Given 09/30/22 1858)  ondansetron (ZOFRAN) injection 4 mg (4 mg Intravenous Given 09/30/22 1857)  sodium chloride 0.9 % bolus 1,000 mL ( Intravenous Stopped 09/30/22 1903)  iohexol (OMNIPAQUE) 300 MG/ML solution 100 mL (80 mLs Intravenous Contrast Given 09/30/22 1907)  morphine (PF) 2 MG/ML injection 2 mg (2 mg Intravenous Given 09/30/22 2055)    ED Course/ Medical Decision Making/ A&P                           Medical Decision Making Amount and/or Complexity of Data Reviewed Labs: ordered. Radiology: ordered.  Risk Prescription drug management.   This  patient presents to the ED for concern of abdominal pain, nausea, vomiting, this involves an extensive number of treatment options, and is a complaint that carries with it a high risk of complications and morbidity.  The differential diagnosis includes cholecystitis, appendicitis gastroenteritis, small bowel colitis, and others   Co morbidities that complicate the patient evaluation  History of small bowel obstruction, narcotic dependence   Additional history obtained:  Additional history obtained from family at bedside External records from outside source obtained and reviewed including urgent care notes from yesterday   Lab Tests:  I Ordered, and personally interpreted labs.  The pertinent results include: Unremarkable UA, lipase, CMP, urinalysis.   Imaging Studies ordered:  I ordered imaging studies including CT abdomen pelvis with contrast I independently visualized and interpreted imaging which showed no acute findings I agree with the radiologist interpretation    Problem List / ED Course / Critical interventions / Medication management   I ordered medication including Morphine for pain, Zofran for nausea Reevaluation of the patient after these medicines showed that the patient improved I have reviewed the patients home medicines and have made adjustments as needed   Social Determinants of Health:  Patient with history of narcotic dependence   Test / Admission - Considered:  There were no acute findings on the CT scan.  No signs of appendicitis, cholecystitis, colitis.  I believe the patient may have gastroenteritis.  [name] has no elevation in her white blood cell count.  Her abdominal pain is generalized in nature.  There is no focal tenderness.  Plan to discharge patient home with prescription for Phenergan suppositories.  I discussed this with the patient and [name] was in agreement to try the suppository method.  Patient advised to take small sips of water when [name] is  home.  If her nausea truly becomes intractable [name] understands to return to the emergency department for further evaluation and management.        Final Clinical Impression(s) / ED Diagnoses Final diagnoses:  Nausea and vomiting, unspecified vomiting type    Rx / DC Orders ED Discharge Orders          Ordered    promethazine (PHENERGAN) 25 MG suppository  Every 6 hours PRN        09/30/22 2105              Ronny Bacon 09/30/22 2105    Elnora Morrison, MD 10/01/22 [id number]

## 2022-11-14 ENCOUNTER — Encounter (HOSPITAL_COMMUNITY): Payer: Self-pay | Admitting: Emergency Medicine

## 2022-11-14 ENCOUNTER — Ambulatory Visit (HOSPITAL_COMMUNITY)
Admission: EM | Admit: 2022-11-14 | Discharge: 2022-11-14 | Disposition: A | Discharge status: Home / Self Care | Payer: Medicaid Other | Attending: Internal Medicine | Admitting: Internal Medicine

## 2022-11-14 DIAGNOSIS — N760 Acute vaginitis: Secondary | ICD-10-CM | POA: Insufficient documentation

## 2022-11-14 LAB — POCT URINALYSIS DIPSTICK, ED / UC
Bilirubin Urine: NEGATIVE
Glucose, UA: NEGATIVE mg/dL
Ketones, ur: NEGATIVE mg/dL
Nitrite: NEGATIVE
Protein, ur: NEGATIVE mg/dL
Specific Gravity, Urine: >=1.03 (ref 1.005–1.030)
Urobilinogen, UA: 0.2 mg/dL (ref 0.0–1.0)
pH: 5.5 (ref 5.0–8.0)

## 2022-11-14 MED ORDER — FLUCONAZOLE 150 MG PO TABS: 150.0000 mg | ORAL_TABLET | ORAL | 0 refills | Status: AC

## 2022-11-14 MED ORDER — METRONIDAZOLE 500 MG PO TABS: 500.0000 mg | ORAL_TABLET | Freq: Two times a day (BID) | ORAL | 0 refills | Status: DC

## 2022-11-14 NOTE — ED Provider Notes (Signed)
[place]    CSN: [id number] Arrival date & time: 11/14/22  1953      History   Chief Complaint Chief Complaint  Patient presents with   Vaginal Discharge   Dysuria    HPI [name] is a 46 y.o. female.   Subjective:   [name] is a 46 y.o. female who presents for evaluation of vaginal symptoms of itching, vaginal discharge that is white, watery, and malodorous and abdominal pain located in the suprapubic described as pressure-like without radiation. Symptoms have been present for 5 days. Symptoms also include dysuria, local irritation and vulvar itching.  Urinary frequency, flank pain or back pain.  No fevers.  She has a history of tubal ligation and is postmenopausal. Denies sexual activity.    The following portions of the patient's history were reviewed and updated as appropriate: allergies, current medications, past family history, past medical history, past social history, past surgical history, and problem list.      Past Medical History:  Diagnosis Date   Anemia    Cervical disc disease    Fibroids    Heart murmur    Low back pain    Partial small bowel obstruction ([place]) 03/2018    Patient Active Problem List   Diagnosis Date Noted   Chronically on opiate therapy    Acute lower UTI 03/19/2018   Dysfunctional uterine bleeding 03/19/2018   Intra-abdominal abscess ([place]) 02/01/2018   Partial small bowel obstruction ([place]) 02/29/2016   Recurrent abdominal pain 02/29/2016   Chronic anemia 02/29/2016   Chronic constipation 02/19/2016   Narcotic dependence ([place]) 02/19/2016   Ileus ([place]) 02/08/2016   Chronic narcotic use 02/08/2016   Anemia, iron deficiency    Small bowel obstruction ([place]) 01/29/2016   Hypokalemia 01/29/2016   Iron deficiency anemia due to chronic blood loss 01/29/2016   Anemia 01/29/2016   Abdominal pain 01/09/2016   Generalized abdominal pain 01/09/2016   Nausea with vomiting 01/09/2016   Tobacco abuse 01/09/2016    Abdominal pain in female    Constipation due to opioid therapy     Past Surgical History:  Procedure Laterality Date   ANKLE SURGERY     CESAREAN SECTION     TUBAL LIGATION      OB History     Gravida  4   Para  4   Term      Preterm  4   AB      Living  4      SAB      IAB      Ectopic      Multiple      Live Births               Home Medications    Prior to Admission medications   Medication Sig Start Date End Date Taking? Authorizing Provider  fluconazole (DIFLUCAN) 150 MG tablet Take 1 tablet (150 mg total) by mouth every 3 (three) days for 2 doses. 11/14/22 11/18/22 Yes Enrique Sack, FNP  metroNIDAZOLE (FLAGYL) 500 MG tablet Take 1 tablet (500 mg total) by mouth 2 (two) times daily. 11/14/22  Yes Enrique Sack, FNP  ALPRAZolam Duanne Moron) 0.25 MG tablet Take 0.25 mg by mouth daily as needed for anxiety. 05/26/20   [provider]  baclofen (LIORESAL) 10 MG tablet Take 1 tablet (10 mg total) by mouth 3 (three) times daily. 07/13/22   Abner Greenspan, Amy L, PA  cyclobenzaprine (FLEXERIL) 10 MG tablet Take 10 mg by mouth  3 (three) times daily as needed for muscle spasms. 10/23/21   [provider]  diclofenac (VOLTAREN) 50 MG EC tablet Take 1 tablet (50 mg total) by mouth 2 (two) times daily. 07/13/22   Abner Greenspan, Amy L, PA  Ibuprofen-diphenhydrAMINE Cit (ADVIL PM PO) Take 1 tablet by mouth once.    [provider]  naloxone [facility]) nasal spray 4 mg/0.1 mL 1 spray as directed. 10/24/21   [provider]  ondansetron (ZOFRAN-ODT) 4 MG disintegrating tablet Take 1 tablet (4 mg total) by mouth every 8 (eight) hours as needed for nausea or vomiting. 09/29/22   Chase Picket, MD  oxyCODONE-acetaminophen (PERCOCET) 10-325 MG tablet Take 1 tablet by mouth.    [provider]  pantoprazole (PROTONIX) 20 MG tablet Take 1 tablet (20 mg total) by mouth daily. 09/29/22   LampteyMyrene Galas, MD  pregabalin (LYRICA) 75 MG capsule  Take 75 mg by mouth 2 (two) times daily. 05/20/20   [provider]  promethazine (PHENERGAN) 25 MG suppository Place 1 suppository (25 mg total) rectally every 6 (six) hours as needed for nausea or vomiting. 09/30/22   Dorothyann Peng, PA-C  Vitamin D, Ergocalciferol, (DRISDOL) 1.25 MG (50000 UNIT) CAPS capsule Take 50,000 Units by mouth once a week. 10/23/21   [provider]  ferrous sulfate 325 (65 FE) MG tablet Take 1 tablet (325 mg total) by mouth 2 (two) times daily with a meal. Patient not taking: Reported on 03/19/2018 02/06/18 06/22/20  Bonnielee Haff, MD  linaclotide [facility]) 290 MCG CAPS capsule Take 1 capsule (290 mcg total) by mouth daily before breakfast. 03/26/18 06/22/20  Kerney Elbe, DO    Family History Family History  Problem Relation Age of Onset   Bowel Disease Mother    Diabetes Maternal Grandmother    Hypertension Maternal Aunt     Social History Social History   Tobacco Use   Smoking status: Every Day    Packs/day: 0.50    Years: 16.00    Total pack years: 8.00    Types: Cigarettes   Smokeless tobacco: Never  Vaping Use   Vaping Use: Never used  Substance Use Topics   Alcohol use: No    Alcohol/week: 0.0 standard drinks of alcohol   Drug use: No     Allergies   Patient has no known allergies.   Review of Systems Review of Systems  Constitutional:  Negative for fever.  Gastrointestinal:  Negative for nausea and vomiting.  Genitourinary:  Positive for dysuria and vaginal discharge. Negative for flank pain.  All other systems reviewed and are negative.    Physical Exam Triage Vital Signs ED Triage Vitals  Enc Vitals Group     BP 11/14/22 2026 117/72     Pulse Rate 11/14/22 2026 70     Resp 11/14/22 2026 16     Temp 11/14/22 2026 98.2 F (36.8 C)     Temp Source 11/14/22 2026 Oral     SpO2 11/14/22 2026 100 %     Weight --      Height --      Head Circumference --      Peak Flow --      Pain Score 11/14/22 2028  8     Pain Loc --      Pain Edu? --      Excl. in [place]? --    No data found.  Updated Vital Signs BP 117/72 (BP Location: Right Arm)   Pulse 70  Temp 98.2 F (36.8 C) (Oral)   Resp 16   LMP 05/21/2021   SpO2 100%   Visual Acuity Right Eye Distance:   Left Eye Distance:   Bilateral Distance:    Right Eye Near:   Left Eye Near:    Bilateral Near:     Physical Exam Vitals reviewed.  Constitutional:      Appearance: Normal appearance.  HENT:     Head: Normocephalic.  Cardiovascular:     Rate and Rhythm: Normal rate.  Pulmonary:     Effort: Pulmonary effort is normal.  Abdominal:     Palpations: Abdomen is soft.     Tenderness: There is no right CVA tenderness or left CVA tenderness.  Genitourinary:    Comments: Deferred; patient performed self-swab for testing  Musculoskeletal:        General: Normal range of motion.     Cervical back: Normal range of motion and neck supple.  Skin:    General: Skin is warm and dry.  Neurological:     General: No focal deficit present.     Mental Status: She is alert and oriented to person, place, and time.      UC Treatments / Results  Labs (all labs ordered are listed, but only abnormal results are displayed) Labs Reviewed  POCT URINALYSIS DIPSTICK, ED / UC - Abnormal; Notable for the following components:      Result Value   Hgb urine dipstick SMALL (*)    Leukocytes,Ua TRACE (*)    All other components within normal limits  URINE CULTURE  CERVICOVAGINAL ANCILLARY ONLY    EKG   Radiology No results found.  Procedures Procedures (including critical care time)  Medications Ordered in UC Medications - No data to display  Initial Impression / Assessment and Plan / UC Course  I have reviewed the triage vital signs and the nursing notes.  Pertinent labs & imaging results that were available during my care of the patient were reviewed by me and considered in my medical decision making (see chart for details).     46 yo female presenting with a five-day history of vaginal itching, vaginal discharge, odor and suprapubic pressure. She denies sexual activity.  She is afebrile and nontoxic.  Urinalysis unremarkable.  Cervicovaginal testing pending.  Will start Flagyl and Diflucan empirically.  Today's evaluation has revealed no signs of a dangerous process. Discussed diagnosis with patient and/or guardian. Patient and/or guardian aware of their diagnosis, possible red flag symptoms to watch out for and need for close follow up. Patient and/or guardian understands verbal and written discharge instructions. Patient and/or guardian comfortable with plan and disposition.  Patient and/or guardian has a clear mental status at this time, good insight into illness (after discussion and teaching) and has clear judgment to make decisions regarding their care  Documentation was completed with the aid of voice recognition software. Transcription may contain typographical errors. Final Clinical Impressions(s) / UC Diagnoses   Final diagnoses:  Acute vaginitis   Discharge Instructions   None    ED Prescriptions     Medication Sig Dispense Auth. Provider   metroNIDAZOLE (FLAGYL) 500 MG tablet Take 1 tablet (500 mg total) by mouth 2 (two) times daily. 14 tablet Enrique Sack, FNP   fluconazole (DIFLUCAN) 150 MG tablet Take 1 tablet (150 mg total) by mouth every 3 (three) days for 2 doses. 2 tablet Enrique Sack, FNP      PDMP not reviewed this encounter.   Enrique Sack, FNP  11/14/22 2105  

## 2022-11-14 NOTE — ED Triage Notes (Addendum)
Vaginal discharge, itching, burning around 5 days ago. Last time she felt this way she had a yeast infection. Tried the [employer] one day, with improvement, then it came back. Describes the discharge as cream colored and malodorous. Denies any recent unprotected sex. Reports hx of tubal ligation, LMP sometime in late 2023.   Reports a burning with urination, along with some suprapubic pain. Denies fever, N/V/D

## 2022-11-15 LAB — CERVICOVAGINAL ANCILLARY ONLY
Bacterial Vaginitis (gardnerella): NEGATIVE
Candida Glabrata: POSITIVE — AB
Candida Vaginitis: POSITIVE — AB
Chlamydia: NEGATIVE
Comment: NEGATIVE
Comment: NEGATIVE
Comment: NEGATIVE
Comment: NEGATIVE
Comment: NEGATIVE
Comment: NORMAL
Neisseria Gonorrhea: NEGATIVE
Trichomonas: POSITIVE — AB

## 2022-11-16 LAB — URINE CULTURE: Culture: 70000 — AB

## 2022-11-23 ENCOUNTER — Emergency Department (HOSPITAL_COMMUNITY)
Admission: EM | Admit: 2022-11-23 | Discharge: 2022-11-24 | Disposition: A | Discharge status: Home / Self Care | Payer: Medicaid Other | Attending: Emergency Medicine | Admitting: Emergency Medicine

## 2022-11-23 ENCOUNTER — Emergency Department (HOSPITAL_COMMUNITY): Payer: Medicaid Other

## 2022-11-23 ENCOUNTER — Encounter (HOSPITAL_COMMUNITY): Payer: Self-pay

## 2022-11-23 ENCOUNTER — Other Ambulatory Visit: Payer: Self-pay

## 2022-11-23 DIAGNOSIS — N738 Other specified female pelvic inflammatory diseases: Secondary | ICD-10-CM | POA: Diagnosis not present

## 2022-11-23 DIAGNOSIS — N739 Female pelvic inflammatory disease, unspecified: Secondary | ICD-10-CM

## 2022-11-23 DIAGNOSIS — N760 Acute vaginitis: Secondary | ICD-10-CM | POA: Insufficient documentation

## 2022-11-23 DIAGNOSIS — B9689 Other specified bacterial agents as the cause of diseases classified elsewhere: Secondary | ICD-10-CM

## 2022-11-23 DIAGNOSIS — R112 Nausea with vomiting, unspecified: Secondary | ICD-10-CM | POA: Diagnosis present

## 2022-11-23 DIAGNOSIS — Z1152 Encounter for screening for COVID-19: Secondary | ICD-10-CM | POA: Insufficient documentation

## 2022-11-23 LAB — COMPREHENSIVE METABOLIC PANEL
ALT: 29 U/L (ref 0–44)
AST: 28 U/L (ref 15–41)
Albumin: 4.3 g/dL (ref 3.5–5.0)
Alkaline Phosphatase: 68 U/L (ref 38–126)
Anion gap: 7 (ref 5–15)
BUN: 7 mg/dL (ref 6–20)
CO2: 25 mmol/L (ref 22–32)
Calcium: 9.5 mg/dL (ref 8.9–10.3)
Chloride: 106 mmol/L (ref 98–111)
Creatinine, Ser: 0.86 mg/dL (ref 0.44–1.00)
GFR, Estimated: >60 mL/min (ref >60)
Glucose, Bld: 115 mg/dL — ABNORMAL HIGH (ref 70–99)
Potassium: 3.9 mmol/L (ref 3.5–5.1)
Sodium: 138 mmol/L (ref 135–145)
Total Bilirubin: 0.6 mg/dL (ref 0.3–1.2)
Total Protein: 7.5 g/dL (ref 6.5–8.1)

## 2022-11-23 LAB — WET PREP, GENITAL
Sperm: NONE SEEN
Trich, Wet Prep: NONE SEEN
WBC, Wet Prep HPF POC: <10 (ref <10)
Yeast Wet Prep HPF POC: NONE SEEN

## 2022-11-23 LAB — CBC WITH DIFFERENTIAL/PLATELET
Abs Immature Granulocytes: 0.01 10*3/uL (ref 0.00–0.07)
Basophils Absolute: 0 10*3/uL (ref 0.0–0.1)
Basophils Relative: 0 %
Eosinophils Absolute: 0 10*3/uL (ref 0.0–0.5)
Eosinophils Relative: 0 %
HCT: 37 % (ref 36.0–46.0)
Hemoglobin: 12.6 g/dL (ref 12.0–15.0)
Immature Granulocytes: 0 %
Lymphocytes Relative: 26 %
Lymphs Abs: 2 10*3/uL (ref 0.7–4.0)
MCH: 30.7 pg (ref 26.0–34.0)
MCHC: 34.1 g/dL (ref 30.0–36.0)
MCV: 90.2 fL (ref 80.0–100.0)
Monocytes Absolute: 0.3 10*3/uL (ref 0.1–1.0)
Monocytes Relative: 5 %
Neutro Abs: 5.3 10*3/uL (ref 1.7–7.7)
Neutrophils Relative %: 69 %
Platelets: 216 10*3/uL (ref 150–400)
RBC: 4.1 MIL/uL (ref 3.87–5.11)
RDW: 13.1 % (ref 11.5–15.5)
WBC: 7.6 10*3/uL (ref 4.0–10.5)
nRBC: 0 % (ref 0.0–0.2)

## 2022-11-23 LAB — I-STAT CHEM 8, ED
BUN: 7 mg/dL (ref 6–20)
Calcium, Ion: 1.21 mmol/L (ref 1.15–1.40)
Chloride: 103 mmol/L (ref 98–111)
Creatinine, Ser: 0.7 mg/dL (ref 0.44–1.00)
Glucose, Bld: 111 mg/dL — ABNORMAL HIGH (ref 70–99)
HCT: 41 % (ref 36.0–46.0)
Hemoglobin: 13.9 g/dL (ref 12.0–15.0)
Potassium: 3.9 mmol/L (ref 3.5–5.1)
Sodium: 140 mmol/L (ref 135–145)
TCO2: 27 mmol/L (ref 22–32)

## 2022-11-23 LAB — RAPID HIV SCREEN (HIV 1/2 AB+AG)
HIV 1/2 Antibodies: NONREACTIVE
HIV-1 P24 Antigen - HIV24: NONREACTIVE

## 2022-11-23 LAB — URINALYSIS, ROUTINE W REFLEX MICROSCOPIC
Bilirubin Urine: NEGATIVE
Glucose, UA: NEGATIVE mg/dL
Hgb urine dipstick: NEGATIVE
Ketones, ur: NEGATIVE mg/dL
Leukocytes,Ua: NEGATIVE
Nitrite: NEGATIVE
Protein, ur: NEGATIVE mg/dL
Specific Gravity, Urine: 1.014 (ref 1.005–1.030)
pH: 8 (ref 5.0–8.0)

## 2022-11-23 LAB — LIPASE, BLOOD: Lipase: 23 U/L (ref 11–51)

## 2022-11-23 MED ORDER — LACTATED RINGERS IV BOLUS
1000.0000 mL | Freq: Once | INTRAVENOUS | Status: AC
  Administered 2022-11-23: 1000 mL via INTRAVENOUS

## 2022-11-23 MED ORDER — CEFTRIAXONE SODIUM 500 MG IJ SOLR
500.0000 mg | Freq: Once | INTRAMUSCULAR | Status: AC
  Administered 2022-11-23: 500 mg via INTRAMUSCULAR
  Filled 2022-11-23: qty 500

## 2022-11-23 MED ORDER — ONDANSETRON HCL 4 MG/2ML IJ SOLN
4.0000 mg | Freq: Once | INTRAMUSCULAR | Status: AC
  Administered 2022-11-23: 4 mg via INTRAVENOUS
  Filled 2022-11-23: qty 2

## 2022-11-23 MED ORDER — IOHEXOL 350 MG/ML SOLN
75.0000 mL | Freq: Once | INTRAVENOUS | Status: AC | PRN
  Administered 2022-11-23: 75 mL via INTRAVENOUS

## 2022-11-23 MED ORDER — LIDOCAINE HCL (PF) 1 % IJ SOLN
1.0000 mL | Freq: Once | INTRAMUSCULAR | Status: AC
  Administered 2022-11-23: 2.1 mL
  Filled 2022-11-23: qty 5

## 2022-11-23 MED ORDER — FENTANYL CITRATE PF 50 MCG/ML IJ SOSY
50.0000 ug | PREFILLED_SYRINGE | Freq: Once | INTRAMUSCULAR | Status: AC
  Administered 2022-11-23: 50 ug via INTRAVENOUS
  Filled 2022-11-23: qty 1

## 2022-11-23 MED ORDER — METRONIDAZOLE 500 MG PO TABS
2000.0000 mg | ORAL_TABLET | Freq: Once | ORAL | Status: AC
  Administered 2022-11-23: 2000 mg via ORAL
  Filled 2022-11-23: qty 4

## 2022-11-23 MED ORDER — DOXYCYCLINE HYCLATE 100 MG PO TABS: 100.0000 mg | ORAL_TABLET | Freq: Two times a day (BID) | ORAL | 0 refills | Status: DC

## 2022-11-23 MED ORDER — HYDROCODONE-ACETAMINOPHEN 5-325 MG PO TABS
1.0000 | ORAL_TABLET | Freq: Once | ORAL | Status: AC
  Administered 2022-11-23: 1 via ORAL
  Filled 2022-11-23: qty 1

## 2022-11-23 NOTE — ED Notes (Signed)
Patient transported to Ultrasound 

## 2022-11-23 NOTE — ED Notes (Signed)
Patient appears drousy and requires staff to wake patient up to continue conversation. MD Lawsing made aware.

## 2022-11-23 NOTE — ED Triage Notes (Addendum)
Pt to ED c/o abdominal pain x 4 days. Reports n/v. . Hx small bowel obstruction

## 2022-11-23 NOTE — ED Provider Notes (Signed)
[facility] EMERGENCY DEPARTMENT Provider Note   CSN: [id number] Arrival date & time: 11/23/22  1340     History  Chief Complaint  Patient presents with   Nausea   Emesis   Abdominal Pain    Megan ROCCHIO is a 46 y.o. female.   Emesis Associated symptoms: abdominal pain   Abdominal Pain Associated symptoms: vomiting      46 year old female with medical history significant for uterine fibroids, anemia, degenerative disc disease of the cervical spine, partial small bowel obstruction who presents to the emergency department with bilateral lower abdominal pain, nausea and vomiting for the past 4 days.  On chart review, the patient was seen in urgent care on 11/14/2022.  During that time she was diagnosed with trichomonas and vaginitis and treated with Flagyl and Diflucan.  She states that initially she began to improve, now she has had worsening bilateral pelvic pain.  She endorses increased vaginal discharge.  She denies any vaginal bleeding.  She denies any fevers or chills.  She denies any urinary symptoms.  She is currently passing gas.  Home Medications Prior to Admission medications   Medication Sig Start Date End Date Taking? Authorizing Provider  metroNIDAZOLE (FLAGYL) 500 MG tablet Take 1 tablet (500 mg total) by mouth 2 (two) times daily for 14 days. 11/24/22 12/08/22 Yes Regan Lemming, MD  ondansetron (ZOFRAN-ODT) 4 MG disintegrating tablet Take 1 tablet (4 mg total) by mouth every 8 (eight) hours as needed for nausea or vomiting. 11/24/22  Yes Regan Lemming, MD  ALPRAZolam Duanne Moron) 0.25 MG tablet Take 0.25 mg by mouth daily as needed for anxiety. 05/26/20   [provider]  baclofen (LIORESAL) 10 MG tablet Take 1 tablet (10 mg total) by mouth 3 (three) times daily. 07/13/22   Abner Greenspan, Amy L, PA  cyclobenzaprine (FLEXERIL) 10 MG tablet Take 10 mg by mouth 3 (three) times daily as needed for muscle spasms. 10/23/21   [provider]   diclofenac (VOLTAREN) 50 MG EC tablet Take 1 tablet (50 mg total) by mouth 2 (two) times daily. 07/13/22   Abner Greenspan, Amy L, PA  doxycycline (VIBRA-TABS) 100 MG tablet Take 1 tablet (100 mg total) by mouth 2 (two) times daily for 14 days. 11/24/22 12/08/22  Regan Lemming, MD  Ibuprofen-diphenhydrAMINE Cit (ADVIL PM PO) Take 1 tablet by mouth once.    [provider]  naloxone [facility]) nasal spray 4 mg/0.1 mL 1 spray as directed. 10/24/21   [provider]  oxyCODONE-acetaminophen (PERCOCET) 10-325 MG tablet Take 1 tablet by mouth.    [provider]  pantoprazole (PROTONIX) 20 MG tablet Take 1 tablet (20 mg total) by mouth daily. 09/29/22   LampteyMyrene Galas, MD  pregabalin (LYRICA) 75 MG capsule Take 75 mg by mouth 2 (two) times daily. 05/20/20   [provider]  promethazine (PHENERGAN) 25 MG suppository Place 1 suppository (25 mg total) rectally every 6 (six) hours as needed for nausea or vomiting. 09/30/22   Dorothyann Peng, PA-C  Vitamin D, Ergocalciferol, (DRISDOL) 1.25 MG (50000 UNIT) CAPS capsule Take 50,000 Units by mouth once a week. 10/23/21   [provider]  ferrous sulfate 325 (65 FE) MG tablet Take 1 tablet (325 mg total) by mouth 2 (two) times daily with a meal. Patient not taking: Reported on 03/19/2018 02/06/18 06/22/20  Bonnielee Haff, MD  linaclotide [facility]) 290 MCG CAPS capsule Take 1 capsule (290 mcg total) by mouth daily before breakfast. 03/26/18 06/22/20  Alfredia Ferguson,  Bertram Savin, DO      Allergies    Patient has no known allergies.    Review of Systems   Review of Systems  Gastrointestinal:  Positive for abdominal pain and vomiting.  All other systems reviewed and are negative.   Physical Exam Updated Vital Signs BP (!) 146/74 (BP Location: Right Arm)   Pulse (!) 57   Temp 100 F (37.8 C) (Oral)   Resp 14   Ht '5\' 4"'$  (1.626 m)   Wt 61.2 kg   LMP 05/21/2021   SpO2 99%   BMI 23.17 kg/m  Physical Exam Vitals and nursing  note reviewed. Exam conducted with a chaperone present.  Constitutional:      General: She is not in acute distress.    Appearance: She is well-developed.  HENT:     Head: Normocephalic and atraumatic.  Eyes:     Conjunctiva/sclera: Conjunctivae normal.  Cardiovascular:     Rate and Rhythm: Normal rate and regular rhythm.     Heart sounds: No murmur heard. Pulmonary:     Effort: Pulmonary effort is normal. No respiratory distress.     Breath sounds: Normal breath sounds.  Abdominal:     Palpations: Abdomen is soft.     Tenderness: There is abdominal tenderness in the right lower quadrant, suprapubic area and left lower quadrant. There is no guarding or rebound.  Genitourinary:    Comments: Cervical discharge present, no bleeding, + CMT, neg adnexal tenderness Musculoskeletal:        General: No swelling.     Cervical back: Neck supple.  Skin:    General: Skin is warm and dry.     Capillary Refill: Capillary refill takes less than 2 seconds.  Neurological:     Mental Status: She is alert.  Psychiatric:        Mood and Affect: Mood normal.     ED Results / Procedures / Treatments   Labs (all labs ordered are listed, but only abnormal results are displayed) Labs Reviewed  WET PREP, GENITAL - Abnormal; Notable for the following components:      Result Value   Clue Cells Wet Prep HPF POC PRESENT (*)    All other components within normal limits  COMPREHENSIVE METABOLIC PANEL - Abnormal; Notable for the following components:   Glucose, Bld 115 (*)    All other components within normal limits  I-STAT CHEM 8, ED - Abnormal; Notable for the following components:   Glucose, Bld 111 (*)    All other components within normal limits  RESP PANEL BY RT-PCR (RSV, FLU A&B, COVID)  RVPGX2  LIPASE, BLOOD  URINALYSIS, ROUTINE W REFLEX MICROSCOPIC  CBC WITH DIFFERENTIAL/PLATELET  RAPID HIV SCREEN (HIV 1/2 AB+AG)  RPR  I-STAT BETA HCG BLOOD, ED ([facility], [facility], [facility] ONLY)  GC/CHLAMYDIA PROBE AMP  ([place]) NOT AT [facility]    EKG None  Radiology No results found.  Procedures Procedures    Medications Ordered in ED Medications  iohexol (OMNIPAQUE) 350 MG/ML injection 75 mL (75 mLs Intravenous Contrast Given 11/23/22 1708)  ondansetron (ZOFRAN) injection 4 mg (4 mg Intravenous Given 11/23/22 2044)  cefTRIAXone (ROCEPHIN) injection 500 mg (500 mg Intramuscular Given 11/23/22 2049)  lidocaine (PF) (XYLOCAINE) 1 % injection 1-2.1 mL (2.1 mLs Other Given 11/23/22 2052)  metroNIDAZOLE (FLAGYL) tablet 2,000 mg (2,000 mg Oral Given 11/23/22 2045)  fentaNYL (SUBLIMAZE) injection 50 mcg (50 mcg Intravenous Given 11/23/22 2048)  lactated ringers bolus 1,000 mL (0 mLs Intravenous Stopped 11/23/22  2330)  HYDROcodone-acetaminophen (NORCO/VICODIN) 5-325 MG per tablet 1 tablet (1 tablet Oral Given 11/23/22 2332)    ED Course/ Medical Decision Making/ A&P Clinical Course as of 11/28/22 0913  Wed Nov 23, 2022  2354 Clue Cells Wet Prep HPF POC(!): PRESENT [JL]    Clinical Course User Index [JL] Regan Lemming, MD                           Medical Decision Making Amount and/or Complexity of Data Reviewed Labs: ordered. Decision-making details documented in ED Course. Radiology: ordered.  Risk Prescription drug management.    46 year old female with medical history significant for uterine fibroids, anemia, degenerative disc disease of the cervical spine, partial small bowel obstruction who presents to the emergency department with bilateral lower abdominal pain, nausea and vomiting for the past 4 days.  On chart review, the patient was seen in urgent care on 11/14/2022.  During that time she was diagnosed with trichomonas and vaginitis and treated with Flagyl and Diflucan.  She states that initially she began to improve, now she has had worsening bilateral pelvic pain.  She endorses increased vaginal discharge.  She denies any vaginal bleeding.  She denies any fevers or chills.  She denies  any urinary symptoms.  She is currently passing gas.   Vitals and telemetry on arrival: Afebrile, not tachycardic or tachypneic, hypertensive BP 164/89, saturating 99% on room air  Pertinent exam findings include: Bilateral lower abdominal pain, pelvic pain and discomfort and tenderness on exam, positive CMT, negative for vaginal bleeding on pelvic exam, negative adnexal tenderness  Differential Diagnosis: Most likely PID.  Also considered appendicitis, Bowel Obstruction, AAA, ACS, pneumonia, pneumothorax, Pyelonephritis, Nephrolithiasis, Pancreatitis, Cholecystitis, Shingles, Perforated Bowel or Ulcer, Diverticulosis/itis, Ischemic Mesentery, Inflammatory Bowel Disease, Strangulated/Incarcerated Hernia.  Lab results include: Wet prep with clue cells present, COVID-19, influenza, RSV testing negative, hCG normal, RPR normal, GC/committee collected and pending, HIV collected and pending, CMP unremarkable, urinalysis without evidence of UTI, lipase normal, CBC unremarkable.  Imaging results include: CT abdomen pelvis performed revealed the following: IMPRESSION:  There is small pocket of air in the uterus, possibly suggesting  recent instrumentation. Possibility of infectious process is not  excluded. Please correlate with clinical history and consider pelvic  sonogram if warranted. Small amount of free fluid in pelvis may  suggest recent rupture of ovarian cyst or follicle.    There is no evidence of intestinal obstruction or pneumoperitoneum.  There is no hydronephrosis.    Appendix is slightly more prominent than usual in caliber measuring  8 mm. However, there is no evidence of any pericecal inflammatory  stranding or fluid collection. This may be a normal variation.    There is mild diffuse wall thickening in small bowel loops which may  be due to incomplete distention or suggest nonspecific enteritis.    There are scattered calcifications in aorta and its major branches  suggesting  arterio sclerosis. Right renal cyst.    Other findings as described in the body of the report.    Pelvic US: IMPRESSION:  The pocket of air seen on recent CT examination involving the uterus  is not visualized on this exam.    Nonvisualization of the ovaries.    Fluid is noted within the vaginal canal of uncertain significance.     Course of tx has consisted of: Patient was treated for PID empirically based on her symptoms.  Symptoms likely due to bacterial vaginosis and developing  PID.  No bowel obstruction on CT imaging, appendix slightly more prominent than normal, no surrounding inflammation to suggest appendicitis.  Will treat with a course of antibiotics for PID, return precautions provided, overall stable at time of discharge. Return precautions provided.  Final Clinical Impression(s) / ED Diagnoses Final diagnoses:  PID (pelvic inflammatory disease)  Bacterial vaginosis    Rx / DC Orders ED Discharge Orders          Ordered    doxycycline (VIBRA-TABS) 100 MG tablet  2 times daily        11/24/22 0001    metroNIDAZOLE (FLAGYL) 500 MG tablet  2 times daily        11/24/22 0001    ondansetron (ZOFRAN-ODT) 4 MG disintegrating tablet  Every 8 hours PRN        11/24/22 0001    doxycycline (VIBRA-TABS) 100 MG tablet  2 times daily,   Status:  Discontinued        11/23/22 1951              Regan Lemming, MD 11/28/22 [id number]

## 2022-11-23 NOTE — ED Provider Triage Note (Signed)
Emergency Medicine Provider Triage Evaluation Note  ROSELLEN LICHTENBERGER , a 46 y.o. female  was evaluated in triage.  Pt complains of LLQ abdominal pain, nausea and vomiting x4 days.  She has been unable to tolerate PO intake.  Hx significant for previous bowel obstruction.  Denies fevers, chills, diarrhea.  Last bowel movement today.   Review of Systems  Positive: See above Negative: See above  Physical Exam  BP (!) 164/89   Pulse (!) 59   Temp 98.7 F (37.1 C)   Resp 14   Ht '5\' 4"'$  (1.626 m)   Wt 61.2 kg   LMP 05/21/2021   SpO2 99%   BMI 23.17 kg/m  Gen:   Awake, no distress   Resp:  Normal effort  MSK:   Moves extremities without difficulty  Other:  LLQ tenderness to palpation, no distension  Medical Decision Making  Medically screening exam initiated at 2:41 PM.  Appropriate orders placed.  LYNLEIGH KOVACK was informed that the remainder of the evaluation will be completed by another provider, this initial triage assessment does not replace that evaluation, and the importance of remaining in the ED until their evaluation is complete.     Theressa Stamps R, [place] 11/23/22 1456

## 2022-11-24 LAB — GC/CHLAMYDIA PROBE AMP (~~LOC~~) NOT AT ARMC
Chlamydia: NEGATIVE
Comment: NEGATIVE
Comment: NORMAL
Neisseria Gonorrhea: NEGATIVE

## 2022-11-24 LAB — RESP PANEL BY RT-PCR (RSV, FLU A&B, COVID)  RVPGX2
Influenza A by PCR: NEGATIVE
Influenza B by PCR: NEGATIVE
Resp Syncytial Virus by PCR: NEGATIVE
SARS Coronavirus 2 by RT PCR: NEGATIVE

## 2022-11-24 LAB — RPR: RPR Ser Ql: NONREACTIVE

## 2022-11-24 LAB — I-STAT BETA HCG BLOOD, ED (MC, WL, AP ONLY): I-stat hCG, quantitative: <5 m[IU]/mL (ref <5)

## 2022-11-24 MED ORDER — METRONIDAZOLE 500 MG PO TABS: 500.0000 mg | ORAL_TABLET | Freq: Two times a day (BID) | ORAL | 0 refills | Status: AC

## 2022-11-24 MED ORDER — ONDANSETRON 4 MG PO TBDP: 4.0000 mg | ORAL_TABLET | Freq: Three times a day (TID) | ORAL | 0 refills | Status: DC | PRN

## 2022-11-24 MED ORDER — DOXYCYCLINE HYCLATE 100 MG PO TABS: 100.0000 mg | ORAL_TABLET | Freq: Two times a day (BID) | ORAL | 0 refills | Status: AC

## 2022-11-24 NOTE — Discharge Instructions (Addendum)
Your symptoms are concerning for pelvic inflammatory disease.  Recommend you take antibiotics for 14 days.  Zofran ODT has been provided for nausea.  Continue your outpatient pain regimen.

## 2023-04-02 ENCOUNTER — Encounter (HOSPITAL_COMMUNITY): Payer: Self-pay

## 2023-04-02 ENCOUNTER — Ambulatory Visit (HOSPITAL_COMMUNITY)
Admission: EM | Admit: 2023-04-02 | Discharge: 2023-04-02 | Disposition: A | Discharge status: Home / Self Care | Payer: Medicaid Other | Attending: Family Medicine | Admitting: Family Medicine

## 2023-04-02 ENCOUNTER — Ambulatory Visit (INDEPENDENT_AMBULATORY_CARE_PROVIDER_SITE_OTHER): Payer: Medicaid Other

## 2023-04-02 DIAGNOSIS — M25532 Pain in left wrist: Secondary | ICD-10-CM | POA: Diagnosis not present

## 2023-04-02 MED ORDER — KETOROLAC TROMETHAMINE 30 MG/ML IJ SOLN
INTRAMUSCULAR | Status: AC
  Filled 2023-04-02: qty 1

## 2023-04-02 MED ORDER — KETOROLAC TROMETHAMINE 30 MG/ML IJ SOLN
30.0000 mg | Freq: Once | INTRAMUSCULAR | Status: AC
  Administered 2023-04-02: 30 mg via INTRAMUSCULAR

## 2023-04-02 MED ORDER — NAPROXEN 500 MG PO TABS: 500.0000 mg | ORAL_TABLET | Freq: Two times a day (BID) | ORAL | 0 refills | Status: DC | PRN

## 2023-04-02 NOTE — Discharge Instructions (Addendum)
The x-ray did not show any bony problems.  I think you have some tendinitis and inflammation in your wrist.  You have been given a shot of Toradol 30 mg today.  Take naproxen 500 mg--1 tablet every 12 hours as needed for pain  Follow-up with your primary care about this issue

## 2023-04-02 NOTE — ED Provider Notes (Signed)
[facility]    CSN: [id number] Arrival date & time: 04/02/23  1514      History   Chief Complaint Chief Complaint  Patient presents with   Wrist Pain    Left    HPI [name] is a 47 y.o. female.    Wrist Pain   Here for left wrist pain it has been bothering her about a week.  No trauma or fall It hurts to flex and extend it and to flex it toward the radial or ulnar side it is a little swollen.    She takes Percocet 10 mg / 325 mg, and she feels 180 every month; last fill was April 8   Past Medical History:  Diagnosis Date   Anemia    Cervical disc disease    Fibroids    Heart murmur    Low back pain    Partial small bowel obstruction 03/2018    Patient Active Problem List   Diagnosis Date Noted   Chronically on opiate therapy    Acute lower UTI 03/19/2018   Dysfunctional uterine bleeding 03/19/2018   Intra-abdominal abscess 02/01/2018   Partial small bowel obstruction (HCC) 02/29/2016   Recurrent abdominal pain 02/29/2016   Chronic anemia 02/29/2016   Chronic constipation 02/19/2016   Narcotic dependence 02/19/2016   Ileus 02/08/2016   Chronic narcotic use 02/08/2016   Anemia, iron deficiency    Small bowel obstruction (HCC) 01/29/2016   Hypokalemia 01/29/2016   Iron deficiency anemia due to chronic blood loss 01/29/2016   Anemia 01/29/2016   Abdominal pain 01/09/2016   Generalized abdominal pain 01/09/2016   Nausea with vomiting 01/09/2016   Tobacco abuse 01/09/2016   Abdominal pain in female    Constipation due to opioid therapy     Past Surgical History:  Procedure Laterality Date   ANKLE SURGERY     CESAREAN SECTION     TUBAL LIGATION      OB History     Gravida  4   Para  4   Term      Preterm  4   AB      Living  4      SAB      IAB      Ectopic      Multiple      Live Births               Home Medications    Prior to Admission medications   Medication Sig Start Date End Date Taking?  Authorizing Provider  naproxen (NAPROSYN) 500 MG tablet Take 1 tablet (500 mg total) by mouth 2 (two) times daily as needed (pain). 04/02/23  Yes Sandro Burgo, Janace Aris, MD  ALPRAZolam Prudy Feeler) 0.25 MG tablet Take 0.25 mg by mouth daily as needed for anxiety. 05/26/20   [provider]  naloxone [facility]) nasal spray 4 mg/0.1 mL 1 spray as directed. 10/24/21   [provider]  oxyCODONE-acetaminophen (PERCOCET) 10-325 MG tablet Take 1 tablet by mouth.    [provider]  pantoprazole (PROTONIX) 20 MG tablet Take 1 tablet (20 mg total) by mouth daily. 09/29/22   Lamptey, Britta Mccreedy, MD  pregabalin (LYRICA) 100 MG capsule Take 100 mg by mouth 3 (three) times daily. 10/20/22   [provider]  pregabalin (LYRICA) 75 MG capsule Take 75 mg by mouth 2 (two) times daily. 05/20/20   [provider]  promethazine (PHENERGAN) 25 MG suppository Place 1 suppository (25 mg total) rectally every  6 (six) hours as needed for nausea or vomiting. 09/30/22   Darrick Grinder, PA-C  Vitamin D, Ergocalciferol, (DRISDOL) 1.25 MG (50000 UNIT) CAPS capsule Take 50,000 Units by mouth once a week. 10/23/21   [provider]  ferrous sulfate 325 (65 FE) MG tablet Take 1 tablet (325 mg total) by mouth 2 (two) times daily with a meal. Patient not taking: Reported on 03/19/2018 02/06/18 06/22/20  Osvaldo Shipper, MD  linaclotide [facility]) 290 MCG CAPS capsule Take 1 capsule (290 mcg total) by mouth daily before breakfast. 03/26/18 06/22/20  Merlene Laughter, DO    Family History Family History  Problem Relation Age of Onset   Bowel Disease Mother    Diabetes Maternal Grandmother    Hypertension Maternal Aunt     Social History Social History   Tobacco Use   Smoking status: Every Day    Packs/day: 0.50    Years: 16.00    Additional pack years: 0.00    Total pack years: 8.00    Types: Cigarettes   Smokeless tobacco: Never  Vaping Use   Vaping Use: Never used  Substance Use  Topics   Alcohol use: No    Alcohol/week: 0.0 standard drinks of alcohol   Drug use: No     Allergies   Patient has no known allergies.   Review of Systems Review of Systems   Physical Exam Triage Vital Signs ED Triage Vitals  Enc Vitals Group     BP 04/02/23 1536 122/78     Pulse Rate 04/02/23 1536 (!) 58     Resp 04/02/23 1536 18     Temp 04/02/23 1536 98.6 F (37 C)     Temp Source 04/02/23 1536 Oral     SpO2 04/02/23 1536 99 %     Weight 04/02/23 1534 150 lb (68 kg)     Height 04/02/23 1534  (1.626 m)     Head Circumference --      Peak Flow --      Pain Score 04/02/23 1533 8     Pain Loc --      Pain Edu? --      Excl. in GC? --    No data found.  Updated Vital Signs BP 122/78 (BP Location: Right Arm)   Pulse 61   Temp 98.6 F (37 C) (Oral)   Resp 18   Ht  (1.626 m)   Wt 68 kg   LMP 05/21/2021   SpO2 99%   BMI 25.75 kg/m   Visual Acuity Right Eye Distance:   Left Eye Distance:   Bilateral Distance:    Right Eye Near:   Left Eye Near:    Bilateral Near:     Physical Exam Vitals reviewed.  Constitutional:      General: She is not in acute distress.    Appearance: She is not ill-appearing, toxic-appearing or diaphoretic.  Musculoskeletal:     Comments: There is a little puffiness on the dorsal side of her wrist on the left.  Pulses are normal and capillary refill is normal.  There is tenderness of the radial aspect and of the dorsal aspect of the wrist. There is no deformity  Skin:    Coloration: Skin is not pale.  Neurological:     Mental Status: She is alert and oriented to person, place, and time.  Psychiatric:        Behavior: Behavior normal.      UC Treatments / Results  Labs (  all labs ordered are listed, but only abnormal results are displayed) Labs Reviewed - No data to display  EKG   Radiology DG Wrist Complete Left  Result Date: 04/02/2023 CLINICAL DATA:  Left wrist pain and swelling for 1 week. No known  injury. EXAM: LEFT WRIST - COMPLETE 3+ VIEW COMPARISON:  None Available. FINDINGS: There is no evidence of fracture or dislocation. There is no evidence of arthropathy or other focal bone abnormality. Slight radial soft tissue edema. No radiopaque foreign body or soft tissue gas. IMPRESSION: Slight radial soft tissue edema. No osseous abnormality. Electronically Signed   By: Narda Rutherford M.D.   On: 04/02/2023 16:29    Procedures Procedures (including critical care time)  Medications Ordered in UC Medications  ketorolac (TORADOL) 30 MG/ML injection 30 mg (has no administration in time range)    Initial Impression / Assessment and Plan / UC Course  I have reviewed the triage vital signs and the nursing notes.  Pertinent labs & imaging results that were available during my care of the patient were reviewed by me and considered in my medical decision making (see chart for details).        X-rays negative.  There is some edema evident on the x-ray.  Wrist brace is provided and we are giving her a shot of Toradol.  Naproxen is sent in for pain and inflammation.  She is given contact information for hand, but she will also need to follow-up with her primary care Final Clinical Impressions(s) / UC Diagnoses   Final diagnoses:  Acute pain of left wrist     Discharge Instructions      The x-ray did not show any bony problems.  I think you have some tendinitis and inflammation in your wrist.  You have been given a shot of Toradol 30 mg today.  Take naproxen 500 mg--1 tablet every 12 hours as needed for pain  Follow-up with your primary care about this issue       ED Prescriptions     Medication Sig Dispense Auth. Provider   naproxen (NAPROSYN) 500 MG tablet Take 1 tablet (500 mg total) by mouth 2 (two) times daily as needed (pain). 30 tablet Raiquan Chandler, Janace Aris, MD      I have reviewed the PDMP during this encounter.   Zenia Resides, MD 04/02/23 [phone]

## 2023-04-02 NOTE — ED Triage Notes (Signed)
Left wrist pain, for about a week now. No injury.

## 2023-04-12 ENCOUNTER — Other Ambulatory Visit: Payer: Self-pay | Admitting: Internal Medicine

## 2023-04-13 LAB — C. TRACHOMATIS/N. GONORRHOEAE RNA
C. trachomatis RNA, TMA: NOT DETECTED
N. gonorrhoeae RNA, TMA: NOT DETECTED

## 2023-04-13 LAB — SPECIMEN ID NOTIFICATION MISSING 2ND ID

## 2023-06-20 ENCOUNTER — Encounter (HOSPITAL_COMMUNITY): Payer: Self-pay | Admitting: Emergency Medicine

## 2023-06-20 ENCOUNTER — Ambulatory Visit (HOSPITAL_COMMUNITY)
Admission: EM | Admit: 2023-06-20 | Discharge: 2023-06-20 | Disposition: A | Discharge status: Home / Self Care | Payer: Medicaid Other | Attending: Family Medicine | Admitting: Family Medicine

## 2023-06-20 DIAGNOSIS — S63502A Unspecified sprain of left wrist, initial encounter: Secondary | ICD-10-CM

## 2023-06-20 MED ORDER — KETOROLAC TROMETHAMINE 30 MG/ML IJ SOLN
30.0000 mg | Freq: Once | INTRAMUSCULAR | Status: AC
  Administered 2023-06-20: 30 mg via INTRAMUSCULAR

## 2023-06-20 MED ORDER — KETOROLAC TROMETHAMINE 30 MG/ML IJ SOLN
INTRAMUSCULAR | Status: AC
  Filled 2023-06-20: qty 1

## 2023-06-20 NOTE — Discharge Instructions (Addendum)
Meds ordered this encounter  Medications   ketorolac (TORADOL) 30 MG/ML injection 30 mg    
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
## 2023-06-30 ENCOUNTER — Ambulatory Visit (INDEPENDENT_AMBULATORY_CARE_PROVIDER_SITE_OTHER): Payer: Self-pay

## 2023-06-30 ENCOUNTER — Ambulatory Visit (HOSPITAL_COMMUNITY)
Admission: EM | Admit: 2023-06-30 | Discharge: 2023-06-30 | Disposition: A | Discharge status: Home / Self Care | Payer: Self-pay | Attending: Internal Medicine | Admitting: Internal Medicine

## 2023-06-30 ENCOUNTER — Encounter (HOSPITAL_COMMUNITY): Payer: Self-pay

## 2023-06-30 DIAGNOSIS — M25532 Pain in left wrist: Secondary | ICD-10-CM

## 2023-06-30 LAB — POCT FASTING CBG KUC MANUAL ENTRY: POCT Glucose (KUC): 94 mg/dL (ref 70–99)
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
## 2023-06-30 NOTE — Discharge Instructions (Signed)
Your x-rays of your left wrist were negative for fracture or dislocation. You likely sprained your wrist.   Wear the wrist brace we provided in the clinic for the next couple of weeks to provide compression, stability, and comfort.  Please rest, ice, and elevate your wrist to help it heal and decrease inflammation.   Take 600mg  ibuprofen and/or 1,000mg  tylenol every 6 hours as needed for pain and inflammation. Take with food to avoid stomach upset.  Call the orthopedic provider listed on your discharge paperwork to schedule a follow-up appointment if your symptoms do not improve in the next 1-2 weeks with supportive care.  Return to urgent care if you experience worsening pain, numbness, tingling, change of color in your skin near the injury, or any other concerning symptoms.  I hope you feel better!

## 2023-07-18 ENCOUNTER — Ambulatory Visit (HOSPITAL_COMMUNITY)
Admission: EM | Admit: 2023-07-18 | Discharge: 2023-07-18 | Disposition: A | Discharge status: Home / Self Care | Payer: MEDICAID | Attending: Emergency Medicine | Admitting: Emergency Medicine

## 2023-07-18 ENCOUNTER — Encounter (HOSPITAL_COMMUNITY): Payer: Self-pay

## 2023-07-18 DIAGNOSIS — S060X0A Concussion without loss of consciousness, initial encounter: Secondary | ICD-10-CM

## 2023-07-18 DIAGNOSIS — S0990XA Unspecified injury of head, initial encounter: Secondary | ICD-10-CM

## 2023-07-18 MED ORDER — IBUPROFEN 800 MG PO TABS: 800.0000 mg | ORAL_TABLET | Freq: Three times a day (TID) | ORAL | 0 refills | Status: DC

## 2023-07-18 MED ORDER — CYCLOBENZAPRINE HCL 10 MG PO TABS: 10.0000 mg | ORAL_TABLET | Freq: Three times a day (TID) | ORAL | 0 refills | Status: DC | PRN
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
## 2023-07-18 NOTE — ED Provider Notes (Signed)
[facility]    CSN: [id number] Arrival date & time: 07/18/23  1556      History   Chief Complaint Chief Complaint  Patient presents with   Headache    HPI [name] is a 47 y.o. female.  Presents after a part of ceiling fell on her head today. Plaster or drywall per photo shown to provider. Reports there was a leak and ceiling has been wet for a few days.   Was having 10/10 headache before taking ibuprofen. At first a little confusion that has resolved.  Neck soreness where the ceiling landed on her. Did not lose consciousness. No vomiting.  No history of concussion or head injury  Not anticoagulated  She takes oxycodone chronically. Last refill 4 days ago  Past Medical History:  Diagnosis Date   Anemia    Cervical disc disease    Fibroids    Heart murmur    Low back pain    Partial small bowel obstruction (HCC) 03/2018    Patient Active Problem List   Diagnosis Date Noted   Chronically on opiate therapy    Acute lower UTI 03/19/2018   Dysfunctional uterine bleeding 03/19/2018   Intra-abdominal abscess (HCC) 02/01/2018   Partial small bowel obstruction (HCC) 02/29/2016   Recurrent abdominal pain 02/29/2016   Chronic anemia 02/29/2016   Chronic constipation 02/19/2016   Narcotic dependence (HCC) 02/19/2016   Ileus (HCC) 02/08/2016   Chronic narcotic use 02/08/2016   Anemia, iron deficiency    Small bowel obstruction (HCC) 01/29/2016   Hypokalemia 01/29/2016   Iron deficiency anemia due to chronic blood loss 01/29/2016   Anemia 01/29/2016   Abdominal pain 01/09/2016   Generalized abdominal pain 01/09/2016   Nausea with vomiting 01/09/2016   Tobacco abuse 01/09/2016   Abdominal pain in female    Constipation due to opioid therapy     Past Surgical History:  Procedure Laterality Date   ANKLE SURGERY     CESAREAN SECTION     TUBAL LIGATION      OB History     Gravida  4   Para  4   Term      Preterm  4   AB      Living   4      SAB      IAB      Ectopic      Multiple      Live Births               Home Medications    Prior to Admission medications   Medication Sig Start Date End Date Taking? Authorizing Provider  cyclobenzaprine (FLEXERIL) 10 MG tablet Take 1 tablet (10 mg total) by mouth 3 (three) times daily as needed for muscle spasms. 07/18/23  Yes Woodford Strege, Lurena Joiner, PA-C  ibuprofen (ADVIL) 800 MG tablet Take 1 tablet (800 mg total) by mouth 3 (three) times daily. 07/18/23  Yes Michaeljames Milnes, Lurena Joiner, PA-C  naloxone [facility]) nasal spray 4 mg/0.1 mL 1 spray as directed. 10/24/21   [provider]  ferrous sulfate 325 (65 FE) MG tablet Take 1 tablet (325 mg total) by mouth 2 (two) times daily with a meal. Patient not taking: Reported on 03/19/2018 02/06/18 06/22/20  Osvaldo Shipper, MD  linaclotide [facility]) 290 MCG CAPS capsule Take 1 capsule (290 mcg total) by mouth daily before breakfast. 03/26/18 06/22/20  Merlene Laughter, DO    Family History Family History  Problem Relation Age of Onset   Bowel  Disease Mother    Diabetes Maternal Grandmother    Hypertension Maternal Aunt     Social History Social History   Tobacco Use   Smoking status: Every Day    Current packs/day: 0.50    Average packs/day: 0.5 packs/day for 16.0 years (8.0 ttl pk-yrs)    Types: Cigarettes   Smokeless tobacco: Never  Vaping Use   Vaping status: Never Used  Substance Use Topics   Alcohol use: No    Alcohol/week: 0.0 standard drinks of alcohol   Drug use: No     Allergies   Patient has no known allergies.   Review of Systems Review of Systems  Neurological:  Positive for headaches.   Per HPI  Physical Exam Triage Vital Signs ED Triage Vitals  Encounter Vitals Group     BP 07/18/23 1659 129/77     Systolic BP Percentile --      Diastolic BP Percentile --      Pulse Rate 07/18/23 1658 63     Resp 07/18/23 1658 16     Temp 07/18/23 1658 98.3 F (36.8 C)     Temp Source 07/18/23 1658 Oral      SpO2 07/18/23 1658 96 %     Weight --      Height --      Head Circumference --      Peak Flow --      Pain Score 07/18/23 1658 10     Pain Loc --      Pain Education --      Exclude from Growth Chart --    No data found.  Updated Vital Signs BP 129/77   Pulse 63   Temp 98.3 F (36.8 C) (Oral)   Resp 16   LMP 05/21/2021   SpO2 96%   Physical Exam Vitals and nursing note reviewed.  Constitutional:      General: She is not in acute distress. HENT:     Head: Normocephalic and atraumatic.     Nose: Nose normal.     Mouth/Throat:     Mouth: Mucous membranes are moist.     Pharynx: Oropharynx is clear.  Eyes:     Extraocular Movements: Extraocular movements intact.     Conjunctiva/sclera: Conjunctivae normal.     Pupils: Pupils are equal, round, and reactive to light.  Neck:     Comments: Some cervical paraspinal muscular tenderness. No bony tenderness. Full ROM neck and back Cardiovascular:     Rate and Rhythm: Normal rate and regular rhythm.     Heart sounds: Normal heart sounds.  Pulmonary:     Effort: Pulmonary effort is normal.     Breath sounds: Normal breath sounds.  Musculoskeletal:        General: Normal range of motion.     Cervical back: Normal range of motion.  Neurological:     General: No focal deficit present.     Mental Status: She is alert and oriented to person, place, and time.     Cranial Nerves: Cranial nerves 2-12 are intact. No cranial nerve deficit.     Sensory: Sensation is intact.     Motor: Motor function is intact. No weakness.     Coordination: Coordination is intact. Finger-Nose-Finger Test normal.     Gait: Gait is intact. Gait normal.     Comments: A&O, CN 2-12 intact. Strength and sensation intact bilat     UC Treatments / Results  Labs (all labs ordered are listed, but only  abnormal results are displayed) Labs Reviewed - No data to display  EKG   Radiology No results found.  Procedures Procedures (including  critical care time)  Medications Ordered in UC Medications - No data to display  Initial Impression / Assessment and Plan / UC Course  I have reviewed the triage vital signs and the nursing notes.  Pertinent labs & imaging results that were available during my care of the patient were reviewed by me and considered in my medical decision making (see chart for details).  Vitals stable Neurologically intact No red flags. No indication for head imaging at this time. Discussed symptoms of concussion. Advised continue ibuprofen, can try flexeril TID prn. Discussed avoiding screens, loud sounds, eye strain, etc. Provided with further concussion info in AVS. Work note provided. Discussed reasons to return or be evaluated in the ED. Patient is agreeable to plan, no questions at this time  Final Clinical Impressions(s) / UC Diagnoses   Final diagnoses:  Injury of head, initial encounter  Concussion without loss of consciousness, initial encounter     Discharge Instructions      Please read the attached information regarding concussion  Continue ibuprofen every 6 hours for aches You can take the muscle relaxer three times daily. If the medication makes you drowsy, take only at bed time.  I recommend avoiding bright lights and loud sounds Screens may worsen symptoms; try to avoid frequent use of phone or computer.   Monitor for any worsening of symptoms and go directly to the emergency department if so     ED Prescriptions     Medication Sig Dispense Auth. Provider   cyclobenzaprine (FLEXERIL) 10 MG tablet Take 1 tablet (10 mg total) by mouth 3 (three) times daily as needed for muscle spasms. 20 tablet Charle Mclaurin, PA-C   ibuprofen (ADVIL) 800 MG tablet Take 1 tablet (800 mg total) by mouth 3 (three) times daily. 21 tablet Charmel Pronovost, Lurena Joiner, PA-C      I have reviewed the PDMP during this encounter.   Dariel Betzer, [place] 07/18/23 6301

## 2023-07-18 NOTE — Discharge Instructions (Addendum)
Please read the attached information regarding concussion  Continue ibuprofen every 6 hours for aches You can take the muscle relaxer three times daily. If the medication makes you drowsy, take only at bed time.  I recommend avoiding bright lights and loud sounds Screens may worsen symptoms; try to avoid frequent use of phone or computer.   Monitor for any worsening of symptoms and go directly to the emergency department if so

## 2023-07-25 ENCOUNTER — Other Ambulatory Visit: Payer: Self-pay | Admitting: Internal Medicine

## 2023-08-10 ENCOUNTER — Encounter (HOSPITAL_COMMUNITY): Payer: Self-pay

## 2023-08-10 ENCOUNTER — Other Ambulatory Visit: Payer: Self-pay

## 2023-08-10 ENCOUNTER — Emergency Department (HOSPITAL_COMMUNITY): Payer: MEDICAID

## 2023-08-10 ENCOUNTER — Emergency Department (HOSPITAL_COMMUNITY)
Admission: EM | Admit: 2023-08-10 | Discharge: 2023-08-11 | Disposition: A | Discharge status: Home / Self Care | Payer: MEDICAID | Attending: Emergency Medicine | Admitting: Emergency Medicine

## 2023-08-10 DIAGNOSIS — R509 Fever, unspecified: Secondary | ICD-10-CM | POA: Insufficient documentation

## 2023-08-10 DIAGNOSIS — Z1152 Encounter for screening for COVID-19: Secondary | ICD-10-CM | POA: Insufficient documentation

## 2023-08-10 DIAGNOSIS — R001 Bradycardia, unspecified: Secondary | ICD-10-CM | POA: Diagnosis not present

## 2023-08-10 DIAGNOSIS — B349 Viral infection, unspecified: Secondary | ICD-10-CM

## 2023-08-10 DIAGNOSIS — K5900 Constipation, unspecified: Secondary | ICD-10-CM | POA: Insufficient documentation

## 2023-08-10 DIAGNOSIS — R112 Nausea with vomiting, unspecified: Secondary | ICD-10-CM

## 2023-08-10 DIAGNOSIS — R1032 Left lower quadrant pain: Secondary | ICD-10-CM | POA: Diagnosis present

## 2023-08-10 LAB — URINALYSIS, W/ REFLEX TO CULTURE (INFECTION SUSPECTED)
Bilirubin Urine: NEGATIVE
Glucose, UA: NEGATIVE mg/dL
Ketones, ur: NEGATIVE mg/dL
Leukocytes,Ua: NEGATIVE
Nitrite: NEGATIVE
Protein, ur: NEGATIVE mg/dL
Specific Gravity, Urine: 1.03 (ref 1.005–1.030)
pH: 5 (ref 5.0–8.0)

## 2023-08-10 LAB — PROTIME-INR
INR: 1 (ref 0.8–1.2)
Prothrombin Time: 13.5 seconds (ref 11.4–15.2)

## 2023-08-10 LAB — COMPREHENSIVE METABOLIC PANEL
ALT: 42 U/L (ref 0–44)
AST: 36 U/L (ref 15–41)
Albumin: 4.5 g/dL (ref 3.5–5.0)
Alkaline Phosphatase: 84 U/L (ref 38–126)
Anion gap: 10 (ref 5–15)
BUN: 13 mg/dL (ref 6–20)
CO2: 25 mmol/L (ref 22–32)
Calcium: 9.7 mg/dL (ref 8.9–10.3)
Chloride: 104 mmol/L (ref 98–111)
Creatinine, Ser: 0.92 mg/dL (ref 0.44–1.00)
GFR, Estimated: >60 mL/min (ref >60)
Glucose, Bld: 103 mg/dL — ABNORMAL HIGH (ref 70–99)
Potassium: 3.8 mmol/L (ref 3.5–5.1)
Sodium: 139 mmol/L (ref 135–145)
Total Bilirubin: 0.7 mg/dL (ref 0.3–1.2)
Total Protein: 8.5 g/dL — ABNORMAL HIGH (ref 6.5–8.1)

## 2023-08-10 LAB — I-STAT CHEM 8, ED
BUN: 16 mg/dL (ref 6–20)
Calcium, Ion: 1.17 mmol/L (ref 1.15–1.40)
Chloride: 105 mmol/L (ref 98–111)
Creatinine, Ser: 0.8 mg/dL (ref 0.44–1.00)
Glucose, Bld: 105 mg/dL — ABNORMAL HIGH (ref 70–99)
HCT: 45 % (ref 36.0–46.0)
Hemoglobin: 15.3 g/dL — ABNORMAL HIGH (ref 12.0–15.0)
Potassium: 3.9 mmol/L (ref 3.5–5.1)
Sodium: 141 mmol/L (ref 135–145)
TCO2: 24 mmol/L (ref 22–32)

## 2023-08-10 LAB — I-STAT CG4 LACTIC ACID, ED
Lactic Acid, Venous: 0.9 mmol/L (ref 0.5–1.9)
Lactic Acid, Venous: 1.5 mmol/L (ref 0.5–1.9)

## 2023-08-10 LAB — RESP PANEL BY RT-PCR (RSV, FLU A&B, COVID)  RVPGX2
Influenza A by PCR: NEGATIVE
Influenza B by PCR: NEGATIVE
Resp Syncytial Virus by PCR: NEGATIVE
SARS Coronavirus 2 by RT PCR: NEGATIVE

## 2023-08-10 LAB — CBC WITH DIFFERENTIAL/PLATELET
Abs Immature Granulocytes: 0.03 10*3/uL (ref 0.00–0.07)
Basophils Absolute: 0 10*3/uL (ref 0.0–0.1)
Basophils Relative: 0 %
Eosinophils Absolute: 0 10*3/uL (ref 0.0–0.5)
Eosinophils Relative: 0 %
HCT: 43.6 % (ref 36.0–46.0)
Hemoglobin: 13.5 g/dL (ref 12.0–15.0)
Immature Granulocytes: 0 %
Lymphocytes Relative: 22 %
Lymphs Abs: 1.9 10*3/uL (ref 0.7–4.0)
MCH: 30.4 pg (ref 26.0–34.0)
MCHC: 31 g/dL (ref 30.0–36.0)
MCV: 98.2 fL (ref 80.0–100.0)
Monocytes Absolute: 0.4 10*3/uL (ref 0.1–1.0)
Monocytes Relative: 4 %
Neutro Abs: 6.3 10*3/uL (ref 1.7–7.7)
Neutrophils Relative %: 74 %
Platelets: 219 10*3/uL (ref 150–400)
RBC: 4.44 MIL/uL (ref 3.87–5.11)
RDW: 13.6 % (ref 11.5–15.5)
WBC: 8.6 10*3/uL (ref 4.0–10.5)
nRBC: 0 % (ref 0.0–0.2)

## 2023-08-10 LAB — APTT: aPTT: 26 seconds (ref 24–36)

## 2023-08-10 LAB — LIPASE, BLOOD: Lipase: 18 U/L (ref 11–51)

## 2023-08-10 LAB — HCG, SERUM, QUALITATIVE: Preg, Serum: NEGATIVE

## 2023-08-10 MED ORDER — ONDANSETRON 4 MG PO TBDP
4.0000 mg | ORAL_TABLET | Freq: Once | ORAL | Status: AC
  Administered 2023-08-10: 4 mg via ORAL
  Filled 2023-08-10: qty 1

## 2023-08-10 MED ORDER — ACETAMINOPHEN 325 MG PO TABS
325.0000 mg | ORAL_TABLET | Freq: Once | ORAL | Status: AC
  Administered 2023-08-10: 325 mg via ORAL
  Filled 2023-08-10: qty 1

## 2023-08-10 MED ORDER — KETOROLAC TROMETHAMINE 30 MG/ML IJ SOLN
30.0000 mg | Freq: Once | INTRAMUSCULAR | Status: AC
  Administered 2023-08-11: 30 mg via INTRAVENOUS
  Filled 2023-08-10: qty 1

## 2023-08-10 MED ORDER — IOHEXOL 350 MG/ML SOLN
75.0000 mL | Freq: Once | INTRAVENOUS | Status: AC | PRN
  Administered 2023-08-10: 75 mL via INTRAVENOUS

## 2023-08-10 NOTE — ED Triage Notes (Signed)
Patient reports started having stomach pain, fever and nausea vomiting this morning.  Denies sick exposure.

## 2023-08-10 NOTE — ED Provider Triage Note (Signed)
Emergency Medicine Provider Triage Evaluation Note  [name] , a 47 y.o. female  was evaluated in triage.  Pt complains of left lower quadrant pain since this morning.  She reports that she woke with the pain.  Having some nausea and vomiting.  Not bloody or black.  She reports that she has not had vomited but is able to pass gas.  She has not had pain previously before.  She reports that she finds that she had fever at home.  Denies any urinary or vaginal symptoms.  Review of Systems  Positive:  Negative:   Physical Exam  BP (!) 149/90 (BP Location: Left Arm)   Pulse 64   Temp (!) 101.2 F (38.4 C)   Resp 14   Ht 5\' 4"  (1.626 m)   Wt 68 kg   LMP 05/21/2021   SpO2 99%   BMI 25.75 kg/m  Gen:   Patient appears uncomfortable Resp:  Normal effort  MSK:   Moves extremities without difficulty  Other:  Tenderness to the left lower quadrant.  Abdomen is soft.  No guarding or rebound noted.  Medical Decision Making  Medically screening exam initiated at 5:45 PM.  Appropriate orders placed.  [name] was informed that the remainder of the evaluation will be completed by another provider, this initial triage assessment does not replace that evaluation, and the importance of remaining in the ED until their evaluation is complete.  CT ordered with i-STAT Chem-8 to expedite imaging process.  Nursing to start IV in triage.  Patient is febrile however is not tachycardic or tachypneic or hypotensive.  Does not meet any other sirs criteria at this time.  ED evolving sepsis workup initiated.   Achille Rich, [place] 08/10/23 1801

## 2023-08-10 NOTE — ED Provider Notes (Signed)
[place] [facility] AT [facility] Provider Note   CSN: [id number] Arrival date & time: 08/10/23  1606     History  Chief Complaint  Patient presents with   Abdominal Pain   Fever    [name] is a 47 y.o. female.  The history is provided by the patient.  Abdominal Pain Pain location:  LLQ Pain radiates to:  Does not radiate Pain severity:  Moderate Onset quality:  Gradual Duration:  1 day Timing:  Constant Progression:  Unchanged Chronicity:  New Context: not trauma   Relieved by:  Nothing Worsened by:  Nothing Ineffective treatments:  None tried Associated symptoms: fever, nausea and vomiting   Associated symptoms: no diarrhea   Risk factors: not pregnant   Fever Associated symptoms: nausea and vomiting   Associated symptoms: no diarrhea        Home Medications Prior to Admission medications   Medication Sig Start Date End Date Taking? Authorizing Provider  cyclobenzaprine (FLEXERIL) 10 MG tablet Take 1 tablet (10 mg total) by mouth 3 (three) times daily as needed for muscle spasms. 07/18/23   Rising, Lurena Joiner, PA-C  ibuprofen (ADVIL) 800 MG tablet Take 1 tablet (800 mg total) by mouth 3 (three) times daily. 07/18/23   Rising, Lurena Joiner, PA-C  naloxone [facility]) nasal spray 4 mg/0.1 mL 1 spray as directed. 10/24/21   [provider]  ferrous sulfate 325 (65 FE) MG tablet Take 1 tablet (325 mg total) by mouth 2 (two) times daily with a meal. Patient not taking: Reported on 03/19/2018 02/06/18 06/22/20  Osvaldo Shipper, MD  linaclotide [facility]) 290 MCG CAPS capsule Take 1 capsule (290 mcg total) by mouth daily before breakfast. 03/26/18 06/22/20  Merlene Laughter, DO      Allergies    Patient has no known allergies.    Review of Systems   Review of Systems  Constitutional:  Positive for fever.  Gastrointestinal:  Positive for abdominal pain, nausea and vomiting. Negative for diarrhea.  All other systems reviewed and are  negative.   Physical Exam Updated Vital Signs BP (!) 165/80 (BP Location: Right Arm)   Pulse (!) 48   Temp (!) 100.4 F (38 C) (Oral)   Resp 18   Ht 5\' 4"  (1.626 m)   Wt 68 kg   LMP 05/21/2021   SpO2 100%   BMI 25.75 kg/m  Physical Exam Vitals and nursing note reviewed.  Constitutional:      General: She is not in acute distress.    Appearance: Normal appearance. She is well-developed.  HENT:     Head: Normocephalic and atraumatic.     Nose: Nose normal.  Eyes:     Pupils: Pupils are equal, round, and reactive to light.  Cardiovascular:     Rate and Rhythm: Normal rate and regular rhythm.     Pulses: Normal pulses.     Heart sounds: Normal heart sounds.  Pulmonary:     Effort: Pulmonary effort is normal. No respiratory distress.     Breath sounds: Normal breath sounds.  Abdominal:     General: Bowel sounds are normal. There is no distension.     Palpations: Abdomen is soft.     Tenderness: There is no abdominal tenderness. There is no guarding or rebound.     Hernia: No hernia is present.  Musculoskeletal:        General: Normal range of motion.     Cervical back: Neck supple.  Skin:  General: Skin is warm and dry.     Capillary Refill: Capillary refill takes less than 2 seconds.     Findings: No erythema or rash.  Neurological:     General: No focal deficit present.     Mental Status: She is alert and oriented to person, place, and time.     Deep Tendon Reflexes: Reflexes normal.  Psychiatric:        Mood and Affect: Mood normal.     ED Results / Procedures / Treatments   Labs (all labs ordered are listed, but only abnormal results are displayed) Results for orders placed or performed during the hospital encounter of 08/10/23  Resp panel by RT-PCR (RSV, Flu A&B, Covid) Anterior Nasal Swab   Specimen: Anterior Nasal Swab  Result Value Ref Range   SARS Coronavirus 2 by RT PCR NEGATIVE NEGATIVE   Influenza A by PCR NEGATIVE NEGATIVE   Influenza B by PCR  NEGATIVE NEGATIVE   Resp Syncytial Virus by PCR NEGATIVE NEGATIVE  Comprehensive metabolic panel  Result Value Ref Range   Sodium 139 135 - 145 mmol/L   Potassium 3.8 3.5 - 5.1 mmol/L   Chloride 104 98 - 111 mmol/L   CO2 25 22 - 32 mmol/L   Glucose, Bld 103 (H) 70 - 99 mg/dL   BUN 13 6 - 20 mg/dL   Creatinine, Ser 1.61 0.44 - 1.00 mg/dL   Calcium 9.7 8.9 - 09.6 mg/dL   Total Protein 8.5 (H) 6.5 - 8.1 g/dL   Albumin 4.5 3.5 - 5.0 g/dL   AST 36 15 - 41 U/L   ALT 42 0 - 44 U/L   Alkaline Phosphatase 84 38 - 126 U/L   Total Bilirubin 0.7 0.3 - 1.2 mg/dL   GFR, Estimated >04 >54 mL/min   Anion gap 10 5 - 15  CBC with Differential  Result Value Ref Range   WBC 8.6 4.0 - 10.5 K/uL   RBC 4.44 3.87 - 5.11 MIL/uL   Hemoglobin 13.5 12.0 - 15.0 g/dL   HCT 09.8 11.9 - 14.7 %   MCV 98.2 80.0 - 100.0 fL   MCH 30.4 26.0 - 34.0 pg   MCHC 31.0 30.0 - 36.0 g/dL   RDW 82.9 56.2 - 13.0 %   Platelets 219 150 - 400 K/uL   nRBC 0.0 0.0 - 0.2 %   Neutrophils Relative % 74 %   Neutro Abs 6.3 1.7 - 7.7 K/uL   Lymphocytes Relative 22 %   Lymphs Abs 1.9 0.7 - 4.0 K/uL   Monocytes Relative 4 %   Monocytes Absolute 0.4 0.1 - 1.0 K/uL   Eosinophils Relative 0 %   Eosinophils Absolute 0.0 0.0 - 0.5 K/uL   Basophils Relative 0 %   Basophils Absolute 0.0 0.0 - 0.1 K/uL   Immature Granulocytes 0 %   Abs Immature Granulocytes 0.03 0.00 - 0.07 K/uL  Protime-INR  Result Value Ref Range   Prothrombin Time 13.5 11.4 - 15.2 seconds   INR 1.0 0.8 - 1.2  APTT  Result Value Ref Range   aPTT 26 24 - 36 seconds  Urinalysis, w/ Reflex to Culture (Infection Suspected) -Urine, Clean Catch  Result Value Ref Range   Specimen Source URINE, CLEAN CATCH    Color, Urine YELLOW YELLOW   APPearance CLEAR CLEAR   Specific Gravity, Urine 1.030 1.005 - 1.030   pH 5.0 5.0 - 8.0   Glucose, UA NEGATIVE NEGATIVE mg/dL   Hgb urine dipstick SMALL (A)  NEGATIVE   Bilirubin Urine NEGATIVE NEGATIVE   Ketones, ur NEGATIVE  NEGATIVE mg/dL   Protein, ur NEGATIVE NEGATIVE mg/dL   Nitrite NEGATIVE NEGATIVE   Leukocytes,Ua NEGATIVE NEGATIVE   RBC / HPF 6-10 0 - 5 RBC/hpf   WBC, UA 0-5 0 - 5 WBC/hpf   Bacteria, UA RARE (A) NONE SEEN   Squamous Epithelial / HPF 0-5 0 - 5 /HPF   Mucus PRESENT   Lipase, blood  Result Value Ref Range   Lipase 18 11 - 51 U/L  hCG, serum, qualitative  Result Value Ref Range   Preg, Serum NEGATIVE NEGATIVE  I-Stat Lactic Acid, ED  Result Value Ref Range   Lactic Acid, Venous 0.9 0.5 - 1.9 mmol/L  I-stat chem 8, ED (not at [facility], [facility] or [facility])  Result Value Ref Range   Sodium 141 135 - 145 mmol/L   Potassium 3.9 3.5 - 5.1 mmol/L   Chloride 105 98 - 111 mmol/L   BUN 16 6 - 20 mg/dL   Creatinine, Ser 2.84 0.44 - 1.00 mg/dL   Glucose, Bld 132 (H) 70 - 99 mg/dL   Calcium, Ion 4.40 1.02 - 1.40 mmol/L   TCO2 24 22 - 32 mmol/L   Hemoglobin 15.3 (H) 12.0 - 15.0 g/dL   HCT 72.5 36.6 - 44.0 %  I-Stat Lactic Acid, ED  Result Value Ref Range   Lactic Acid, Venous 1.5 0.5 - 1.9 mmol/L   CT ABDOMEN PELVIS W CONTRAST  Result Date: 08/10/2023 CLINICAL DATA:  Left lower quadrant abdominal pain. EXAM: CT ABDOMEN AND PELVIS WITH CONTRAST TECHNIQUE: Multidetector CT imaging of the abdomen and pelvis was performed using the standard protocol following bolus administration of intravenous contrast. RADIATION DOSE REDUCTION: This exam was performed according to the departmental dose-optimization program which includes automated exposure control, adjustment of the mA and/or kV according to patient size and/or use of iterative reconstruction technique. CONTRAST:  75mL OMNIPAQUE IOHEXOL 350 MG/ML SOLN COMPARISON:  CT abdomen pelvis dated 11/23/2022. FINDINGS: Lower chest: The visualized lung bases are clear. No intra-abdominal free air or free fluid. Hepatobiliary: The liver is unremarkable. No biliary dilatation. The gallbladder is unremarkable. Pancreas: Unremarkable. No pancreatic ductal dilatation or  surrounding inflammatory changes. Spleen: Normal in size without focal abnormality. Adrenals/Urinary Tract: The adrenal glands are unremarkable. There is a 3.5 cm right renal upper pole cyst. There is no hydronephrosis on either side. The visualized ureters appear unremarkable. The urinary bladder is collapsed. Stomach/Bowel: There is moderate stool throughout the colon. There is no bowel obstruction or active inflammation. The appendix is normal. Vascular/Lymphatic: Mild aortoiliac atherosclerotic disease. The IVC is unremarkable. No portal venous gas. There is no adenopathy. Reproductive: The uterus is anteverted.  No adnexal masses. Other: None Musculoskeletal: No acute or significant osseous findings. IMPRESSION: 1. No acute intra-abdominal or pelvic pathology. 2. Moderate colonic stool burden. No bowel obstruction. Normal appendix. 3.  Aortic Atherosclerosis (ICD10-I70.0). Electronically Signed   By: Elgie Collard M.D.   On: 08/10/2023 21:58     EKG EKG Interpretation Date/Time:  Thursday August 10 2023 18:43:19 EDT Ventricular Rate:  52 PR Interval:  128 QRS Duration:  76 QT Interval:  388 QTC Calculation: 360 R Axis:   67  Text Interpretation: sinus bradycardia Confirmed by Brexlee Heberlein ([id number]) on 08/10/2023 11:39:38 PM  Radiology CT ABDOMEN PELVIS W CONTRAST  Result Date: 08/10/2023 CLINICAL DATA:  Left lower quadrant abdominal pain. EXAM: CT ABDOMEN AND PELVIS WITH CONTRAST TECHNIQUE: Multidetector CT imaging of the  abdomen and pelvis was performed using the standard protocol following bolus administration of intravenous contrast. RADIATION DOSE REDUCTION: This exam was performed according to the departmental dose-optimization program which includes automated exposure control, adjustment of the mA and/or kV according to patient size and/or use of iterative reconstruction technique. CONTRAST:  75mL OMNIPAQUE IOHEXOL 350 MG/ML SOLN COMPARISON:  CT abdomen pelvis dated 11/23/2022.  FINDINGS: Lower chest: The visualized lung bases are clear. No intra-abdominal free air or free fluid. Hepatobiliary: The liver is unremarkable. No biliary dilatation. The gallbladder is unremarkable. Pancreas: Unremarkable. No pancreatic ductal dilatation or surrounding inflammatory changes. Spleen: Normal in size without focal abnormality. Adrenals/Urinary Tract: The adrenal glands are unremarkable. There is a 3.5 cm right renal upper pole cyst. There is no hydronephrosis on either side. The visualized ureters appear unremarkable. The urinary bladder is collapsed. Stomach/Bowel: There is moderate stool throughout the colon. There is no bowel obstruction or active inflammation. The appendix is normal. Vascular/Lymphatic: Mild aortoiliac atherosclerotic disease. The IVC is unremarkable. No portal venous gas. There is no adenopathy. Reproductive: The uterus is anteverted.  No adnexal masses. Other: None Musculoskeletal: No acute or significant osseous findings. IMPRESSION: 1. No acute intra-abdominal or pelvic pathology. 2. Moderate colonic stool burden. No bowel obstruction. Normal appendix. 3.  Aortic Atherosclerosis (ICD10-I70.0). Electronically Signed   By: Elgie Collard M.D.   On: 08/10/2023 21:58    Procedures Procedures    Medications Ordered in ED Medications  ketorolac (TORADOL) 30 MG/ML injection 30 mg (has no administration in time range)  ondansetron (ZOFRAN-ODT) disintegrating tablet 4 mg (4 mg Oral Given 08/10/23 1808)  acetaminophen (TYLENOL) tablet 325 mg (325 mg Oral Given 08/10/23 1833)  iohexol (OMNIPAQUE) 350 MG/ML injection 75 mL (75 mLs Intravenous Contrast Given 08/10/23 2140)    ED Course/ Medical Decision Making/ A&P                                 Medical Decision Making Patient with fever today, LLQ pain nausea and vomiting.  H/o constipation   Amount and/or Complexity of Data Reviewed Independent Historian: spouse    Details: See above  External Data Reviewed:  notes.    Details: Previous notes reviewed  Labs: ordered.    Details: Normal lactate 1.5, urine negative for UTI, pregnancy for UTI, normal sodium 139, normal potassium 3.8, normal creatinine.  Normal white count 8.6, normal hemoglobin 13.5, normal platelets. Negative covid and flu  Radiology: ordered and independent interpretation performed.    Details: Constipation by me on CT  Risk Prescription drug management. Risk Details: Well appearing.  PO challenged successfully.  VOmiting likely secondary to viral process, given fever, but can also be due to ongoing constipation.  Ruled out for sepsis.  Exam is benign and reassuring.  Patient has passed PO challenge.  Stable for discharge with close follow up.      Final Clinical Impression(s) / ED Diagnoses Final diagnoses:  Nausea and vomiting, unspecified vomiting type  Constipation, unspecified constipation type   Return for intractable cough, coughing up blood, fevers > 100.4 unrelieved by medication, shortness of breath, intractable vomiting, chest pain, shortness of breath, weakness, numbness, changes in speech, facial asymmetry, abdominal pain, passing out, Inability to tolerate liquids or food, cough, altered mental status or any concerns. No signs of systemic illness or infection. The patient is nontoxic-appearing on exam and vital signs are within normal limits.  I have reviewed the triage vital  signs and the nursing notes. Pertinent labs & imaging results that were available during my care of the patient were reviewed by me and considered in my medical decision making (see chart for details). After history, exam, and medical workup I feel the patient has been appropriately medically screened and is safe for discharge home. Pertinent diagnoses were discussed with the patient. Patient was given return precautions.  Rx / DC Orders ED Discharge Orders     None         Tamelia Michalowski, MD 08/11/23 [id number]

## 2023-08-10 NOTE — ED Notes (Addendum)
Attempted to start IV to expedite care.  Attempt was painful for patient and visitor at bedside stated it was not doing it correctly and asked me to stop Another RN asked to attempt

## 2023-08-11 MED ORDER — DROPERIDOL 2.5 MG/ML IJ SOLN
1.2500 mg | Freq: Once | INTRAMUSCULAR | Status: AC
  Administered 2023-08-11: 1.25 mg via INTRAVENOUS
  Filled 2023-08-11: qty 2

## 2023-08-11 MED ORDER — ONDANSETRON 8 MG PO TBDP: ORAL_TABLET | ORAL | 0 refills | Status: DC

## 2023-08-15 LAB — CULTURE, BLOOD (ROUTINE X 2)
Culture: NO GROWTH
Special Requests: ADEQUATE

## 2023-12-12 ENCOUNTER — Emergency Department (HOSPITAL_COMMUNITY)
Admission: EM | Admit: 2023-12-12 | Discharge: 2023-12-12 | Disposition: A | Discharge status: Home / Self Care | Payer: MEDICAID | Attending: Emergency Medicine | Admitting: Emergency Medicine

## 2023-12-12 ENCOUNTER — Other Ambulatory Visit: Payer: Self-pay

## 2023-12-12 ENCOUNTER — Encounter (HOSPITAL_BASED_OUTPATIENT_CLINIC_OR_DEPARTMENT_OTHER): Payer: Self-pay | Admitting: Emergency Medicine

## 2023-12-12 ENCOUNTER — Encounter (HOSPITAL_COMMUNITY): Payer: Self-pay

## 2023-12-12 DIAGNOSIS — M546 Pain in thoracic spine: Secondary | ICD-10-CM | POA: Insufficient documentation

## 2023-12-12 DIAGNOSIS — M419 Scoliosis, unspecified: Secondary | ICD-10-CM | POA: Diagnosis not present

## 2023-12-12 DIAGNOSIS — M545 Low back pain, unspecified: Secondary | ICD-10-CM | POA: Diagnosis present

## 2023-12-12 DIAGNOSIS — G8929 Other chronic pain: Secondary | ICD-10-CM | POA: Diagnosis not present

## 2023-12-12 DIAGNOSIS — M549 Dorsalgia, unspecified: Secondary | ICD-10-CM | POA: Diagnosis present

## 2023-12-12 DIAGNOSIS — Z79891 Long term (current) use of opiate analgesic: Secondary | ICD-10-CM | POA: Diagnosis not present

## 2023-12-12 MED ORDER — OXYCODONE HCL 5 MG PO TABS
10.0000 mg | ORAL_TABLET | ORAL | Status: AC
  Administered 2023-12-12: 10 mg via ORAL
  Filled 2023-12-12: qty 2

## 2023-12-12 MED ORDER — LIDOCAINE 5 % EX PTCH: 1.0000 | MEDICATED_PATCH | CUTANEOUS | 0 refills | Status: DC

## 2023-12-12 MED ORDER — CYCLOBENZAPRINE HCL 10 MG PO TABS
5.0000 mg | ORAL_TABLET | Freq: Once | ORAL | Status: AC
  Administered 2023-12-12: 5 mg via ORAL
  Filled 2023-12-12: qty 1

## 2023-12-12 MED ORDER — IBUPROFEN 800 MG PO TABS
800.0000 mg | ORAL_TABLET | Freq: Once | ORAL | Status: AC
  Administered 2023-12-12: 800 mg via ORAL
  Filled 2023-12-12: qty 1

## 2023-12-12 MED ORDER — CYCLOBENZAPRINE HCL 10 MG PO TABS: 10.0000 mg | ORAL_TABLET | Freq: Two times a day (BID) | ORAL | 0 refills | Status: DC | PRN

## 2023-12-12 NOTE — ED Triage Notes (Signed)
 Pt arrived POV d/t upper and mid back pain near her spine.  She states she has 3 areas of swelling "knots"  close to spine - hx of scoliosis.

## 2023-12-12 NOTE — ED Provider Notes (Addendum)
 [place] [facility] AT [facility] Provider Note   CSN: [id number] Arrival date & time: 12/12/23  0327     History  Chief Complaint  Patient presents with   Back Pain    [name] is a 47 y.o. female.  HPI 47 year old female history of chronic opioid therapy, dysfunctional uterine bleeding, scoliosis, presents today complaining of upper and mid back pain with areas of knots for the past several days.  She has been taking her chronic pain medication and some ibuprofen  without relief.  She denies trauma, fever, chills, IV drug use, numbness, tingling, or weakness.     Home Medications Prior to Admission medications   Medication Sig Start Date End Date Taking? Authorizing Provider  cyclobenzaprine  (FLEXERIL ) 10 MG tablet Take 1 tablet (10 mg total) by mouth 2 (two) times daily as needed for muscle spasms. 12/12/23  Yes Levander Houston, MD  lidocaine  (LIDODERM ) 5 % Place 1 patch onto the skin daily. Remove & Discard patch within 12 hours or as directed by MD 12/12/23  Yes Levander Houston, MD  ibuprofen  (ADVIL ) 800 MG tablet Take 1 tablet (800 mg total) by mouth 3 (three) times daily. 07/18/23   Rising, Asberry, PA-C  naloxone  (NARCAN ) nasal spray 4 mg/0.1 mL 1 spray as directed. 10/24/21   [provider]  ondansetron  (ZOFRAN -ODT) 8 MG disintegrating tablet 8mg  ODT q8 hours prn nausea 08/11/23   Palumbo, April, MD  ferrous sulfate  325 (65 FE) MG tablet Take 1 tablet (325 mg total) by mouth 2 (two) times daily with a meal. Patient not taking: Reported on 03/19/2018 02/06/18 06/22/20  Verdene Purchase, MD  linaclotide  (LINZESS ) 290 MCG CAPS capsule Take 1 capsule (290 mcg total) by mouth daily before breakfast. 03/26/18 06/22/20  Sheikh, Omair Latif, DO      Allergies    Patient has no known allergies.    Review of Systems   Review of Systems  Physical Exam Updated Vital Signs BP 120/77 (BP Location: Right Arm)   Pulse 73   Temp 98 F (36.7 C) (Oral)    Resp 18   Ht 1.626 m (5' 4)   Wt 68 kg   LMP 05/21/2021   SpO2 98%   BMI 25.75 kg/m  Physical Exam Vitals and nursing note reviewed.  Constitutional:      Appearance: Normal appearance.  HENT:     Head: Normocephalic.     Right Ear: External ear normal.     Left Ear: External ear normal.     Mouth/Throat:     Pharynx: Oropharynx is clear.  Eyes:     Extraocular Movements: Extraocular movements intact.     Pupils: Pupils are equal, round, and reactive to light.  Cardiovascular:     Rate and Rhythm: Normal rate and regular rhythm.     Pulses: Normal pulses.  Pulmonary:     Effort: Pulmonary effort is normal.     Breath sounds: Normal breath sounds.  Abdominal:     Palpations: Abdomen is soft.  Musculoskeletal:     Cervical back: Normal range of motion.     Comments: Back examined scoliosis noted some tender muscular swelling in the perithoracic region No redness, point thoracic or cervical or lumbar tenderness, or warmth noted  Skin:    General: Skin is warm and dry.     Capillary Refill: Capillary refill takes less than 2 seconds.  Neurological:     General: No focal deficit present.     Mental Status:  She is alert.  Psychiatric:        Mood and Affect: Mood normal.     ED Results / Procedures / Treatments   Labs (all labs ordered are listed, but only abnormal results are displayed) Labs Reviewed - No data to display  EKG None  Radiology No results found.  Procedures Procedures    Medications Ordered in ED Medications  ibuprofen  (ADVIL ) tablet 800 mg (has no administration in time range)  cyclobenzaprine  (FLEXERIL ) tablet 5 mg (has no administration in time range)  oxyCODONE  (Oxy IR/ROXICODONE ) immediate release tablet 10 mg (has no administration in time range)    ED Course/ Medical Decision Making/ A&P                                 Medical Decision Making Risk Prescription drug management.   47 year old female with history of scoliosis  presents with pain in her upper back.  She has some perithoracic muscular tenderness.  She has had no trauma or signs of infection.  Doubt thoracic spine imaging would be helpful at this time.  Plan conservative therapy with lidocaine  patches, anti-inflammatories, and muscle relaxants.  Patient is on chronic opioid therapy We have discussed return precautions and need for follow-up.  She was given a work note for 2 days.        Final Clinical Impression(s) / ED Diagnoses Final diagnoses:  Acute midline thoracic back pain    Rx / DC Orders ED Discharge Orders          Ordered    cyclobenzaprine  (FLEXERIL ) 10 MG tablet  2 times daily PRN        12/12/23 0920    lidocaine  (LIDODERM ) 5 %  Every 24 hours        12/12/23 0920              Levander Houston, MD 12/12/23 [id number]    Levander Houston, MD 12/12/23 [phone]

## 2023-12-12 NOTE — ED Triage Notes (Signed)
Back pain x 1 weeks Seen at [facility] at 4 Am nothing change since d/cd

## 2023-12-12 NOTE — Discharge Instructions (Signed)
 Please use lidocaine  patch.  Use Flexeril  as prescribed Please use ibuprofen  400-803 times a day for the next 3 days Please follow-up with your doctor in the next 2 days. Return if you are having worsening pain, fever, chills, weakness, numbness, or tingling of your extremities

## 2023-12-13 ENCOUNTER — Emergency Department (HOSPITAL_BASED_OUTPATIENT_CLINIC_OR_DEPARTMENT_OTHER)
Admission: EM | Admit: 2023-12-13 | Discharge: 2023-12-13 | Disposition: A | Discharge status: Home / Self Care | Payer: MEDICAID | Source: Home / Self Care | Attending: Emergency Medicine | Admitting: Emergency Medicine

## 2023-12-13 ENCOUNTER — Emergency Department (HOSPITAL_BASED_OUTPATIENT_CLINIC_OR_DEPARTMENT_OTHER): Payer: MEDICAID | Admitting: Radiology

## 2023-12-13 DIAGNOSIS — M546 Pain in thoracic spine: Secondary | ICD-10-CM

## 2023-12-13 LAB — CBC WITH DIFFERENTIAL/PLATELET
Abs Immature Granulocytes: 0.01 10*3/uL (ref 0.00–0.07)
Basophils Absolute: 0 10*3/uL (ref 0.0–0.1)
Basophils Relative: 0 %
Eosinophils Absolute: 0.1 10*3/uL (ref 0.0–0.5)
Eosinophils Relative: 2 %
HCT: 33 % — ABNORMAL LOW (ref 36.0–46.0)
Hemoglobin: 10.6 g/dL — ABNORMAL LOW (ref 12.0–15.0)
Immature Granulocytes: 0 %
Lymphocytes Relative: 43 %
Lymphs Abs: 2.6 10*3/uL (ref 0.7–4.0)
MCH: 30.9 pg (ref 26.0–34.0)
MCHC: 32.1 g/dL (ref 30.0–36.0)
MCV: 96.2 fL (ref 80.0–100.0)
Monocytes Absolute: 0.4 10*3/uL (ref 0.1–1.0)
Monocytes Relative: 6 %
Neutro Abs: 3 10*3/uL (ref 1.7–7.7)
Neutrophils Relative %: 49 %
Platelets: 219 10*3/uL (ref 150–400)
RBC: 3.43 MIL/uL — ABNORMAL LOW (ref 3.87–5.11)
RDW: 13.4 % (ref 11.5–15.5)
WBC: 6.1 10*3/uL (ref 4.0–10.5)
nRBC: 0 % (ref 0.0–0.2)

## 2023-12-13 LAB — COMPREHENSIVE METABOLIC PANEL
ALT: 24 U/L (ref 0–44)
AST: 25 U/L (ref 15–41)
Albumin: 3.9 g/dL (ref 3.5–5.0)
Alkaline Phosphatase: 68 U/L (ref 38–126)
Anion gap: 4 — ABNORMAL LOW (ref 5–15)
BUN: 12 mg/dL (ref 6–20)
CO2: 26 mmol/L (ref 22–32)
Calcium: 8.9 mg/dL (ref 8.9–10.3)
Chloride: 108 mmol/L (ref 98–111)
Creatinine, Ser: 0.85 mg/dL (ref 0.44–1.00)
GFR, Estimated: >60 mL/min (ref >60)
Glucose, Bld: 90 mg/dL (ref 70–99)
Potassium: 4.6 mmol/L (ref 3.5–5.1)
Sodium: 138 mmol/L (ref 135–145)
Total Bilirubin: 0.4 mg/dL (ref 0.0–1.2)
Total Protein: 6.2 g/dL — ABNORMAL LOW (ref 6.5–8.1)

## 2023-12-13 LAB — ETHANOL: Alcohol, Ethyl (B): <10 mg/dL (ref <10)

## 2023-12-13 MED ORDER — PREDNISONE 50 MG PO TABS
60.0000 mg | ORAL_TABLET | Freq: Once | ORAL | Status: AC
  Administered 2023-12-13: 60 mg via ORAL
  Filled 2023-12-13: qty 1

## 2023-12-13 MED ORDER — METHOCARBAMOL 500 MG PO TABS
500.0000 mg | ORAL_TABLET | Freq: Once | ORAL | Status: AC
  Administered 2023-12-13: 500 mg via ORAL
  Filled 2023-12-13: qty 1

## 2023-12-13 MED ORDER — OXYCODONE-ACETAMINOPHEN 5-325 MG PO TABS
1.0000 | ORAL_TABLET | Freq: Once | ORAL | Status: AC
  Administered 2023-12-13: 1 via ORAL
  Filled 2023-12-13: qty 1

## 2023-12-13 MED ORDER — PREDNISONE 20 MG PO TABS: 40.0000 mg | ORAL_TABLET | Freq: Every day | ORAL | 0 refills | Status: DC

## 2023-12-13 MED ORDER — OXYCODONE-ACETAMINOPHEN 5-325 MG PO TABS
2.0000 | ORAL_TABLET | Freq: Once | ORAL | Status: AC
  Administered 2023-12-13: 2 via ORAL
  Filled 2023-12-13: qty 2

## 2023-12-13 MED ORDER — KETOROLAC TROMETHAMINE 60 MG/2ML IM SOLN
30.0000 mg | Freq: Once | INTRAMUSCULAR | Status: AC
  Administered 2023-12-13: 30 mg via INTRAMUSCULAR
  Filled 2023-12-13: qty 2

## 2023-12-13 MED ORDER — HYDROMORPHONE HCL 1 MG/ML IJ SOLN
1.0000 mg | Freq: Once | INTRAMUSCULAR | Status: AC
  Administered 2023-12-13: 1 mg via INTRAVENOUS
  Filled 2023-12-13: qty 1

## 2023-12-13 MED ORDER — OXYCODONE-ACETAMINOPHEN 5-325 MG PO TABS
1.0000 | ORAL_TABLET | Freq: Once | ORAL | Status: DC
  Filled 2023-12-13: qty 1

## 2023-12-13 MED ORDER — METHOCARBAMOL 500 MG PO TABS
1000.0000 mg | ORAL_TABLET | Freq: Once | ORAL | Status: AC
  Administered 2023-12-13: 1000 mg via ORAL
  Filled 2023-12-13: qty 2

## 2023-12-13 NOTE — ED Notes (Signed)
 ED Provider at bedside.

## 2023-12-13 NOTE — ED Notes (Signed)
 Pt transported to CT via wheelchair.

## 2023-12-13 NOTE — ED Provider Notes (Signed)
 [place] [facility] AT [facility] Provider Note   CSN: [id number] Arrival date & time: 12/12/23  2216     History  Chief Complaint  Patient presents with   Back Pain    [name] is a 48 y.o. female.   Back Pain Patient presents for back pain.  Medical history includes chronic opiate use, anemia, fibroids.  She has had thoracic back pain for the past week.  She denies any prior injuries.  Location of pain is described as a band across her lower thoracic back.  It is worsened with movements and palpation.  She was seen at [facility] ED yesterday morning.  She was provided with multimodal pain control.  She states that pain has not been relieved with home pain medication.  She denies any other new symptoms.     Home Medications Prior to Admission medications   Medication Sig Start Date End Date Taking? Authorizing Provider  cyclobenzaprine  (FLEXERIL ) 10 MG tablet Take 1 tablet (10 mg total) by mouth 2 (two) times daily as needed for muscle spasms. 12/12/23   Levander Houston, MD  ibuprofen  (ADVIL ) 800 MG tablet Take 1 tablet (800 mg total) by mouth 3 (three) times daily. 07/18/23   Rising, Asberry, PA-C  lidocaine  (LIDODERM ) 5 % Place 1 patch onto the skin daily. Remove & Discard patch within 12 hours or as directed by MD 12/12/23   Levander Houston, MD  naloxone  (NARCAN ) nasal spray 4 mg/0.1 mL 1 spray as directed. 10/24/21   [provider]  ondansetron  (ZOFRAN -ODT) 8 MG disintegrating tablet 8mg  ODT q8 hours prn nausea 08/11/23   Palumbo, April, MD  ferrous sulfate  325 (65 FE) MG tablet Take 1 tablet (325 mg total) by mouth 2 (two) times daily with a meal. Patient not taking: Reported on 03/19/2018 02/06/18 06/22/20  Krishnan, Gokul, MD  linaclotide  (LINZESS ) 290 MCG CAPS capsule Take 1 capsule (290 mcg total) by mouth daily before breakfast. 03/26/18 06/22/20  Sheikh, Omair Latif, DO      Allergies    Patient has no known allergies.    Review of  Systems   Review of Systems  Musculoskeletal:  Positive for back pain.  All other systems reviewed and are negative.   Physical Exam Updated Vital Signs BP 111/64 (BP Location: Right Arm)   Pulse 62   Temp 98.1 F (36.7 C) (Oral)   Resp 14   Ht 5' 4 (1.626 m)   Wt 68 kg   LMP 05/21/2021   SpO2 98%   BMI 25.75 kg/m  Physical Exam Vitals and nursing note reviewed.  Constitutional:      General: She is not in acute distress.    Appearance: Normal appearance. She is well-developed. She is not ill-appearing, toxic-appearing or diaphoretic.  HENT:     Head: Normocephalic and atraumatic.     Right Ear: External ear normal.     Left Ear: External ear normal.     Nose: Nose normal.     Mouth/Throat:     Mouth: Mucous membranes are moist.  Eyes:     Extraocular Movements: Extraocular movements intact.     Conjunctiva/sclera: Conjunctivae normal.  Cardiovascular:     Rate and Rhythm: Normal rate and regular rhythm.  Pulmonary:     Effort: Pulmonary effort is normal. No respiratory distress.  Abdominal:     General: There is no distension.     Palpations: Abdomen is soft.  Musculoskeletal:        General:  Tenderness present. No swelling or deformity. Normal range of motion.     Cervical back: Normal range of motion and neck supple.  Skin:    General: Skin is warm and dry.     Coloration: Skin is not jaundiced or pale.  Neurological:     General: No focal deficit present.     Mental Status: She is alert and oriented to person, place, and time.  Psychiatric:        Mood and Affect: Mood normal.        Behavior: Behavior normal.     ED Results / Procedures / Treatments   Labs (all labs ordered are listed, but only abnormal results are displayed) Labs Reviewed  URINALYSIS, ROUTINE W REFLEX MICROSCOPIC  COMPREHENSIVE METABOLIC PANEL  CBC WITH DIFFERENTIAL/PLATELET  ETHANOL  RAPID URINE DRUG SCREEN, HOSP PERFORMED    EKG None  Radiology DG Thoracic Spine 2  View Result Date: 12/13/2023 CLINICAL DATA:  1 week history of back pain. EXAM: THORACIC SPINE 2 VIEWS COMPARISON:  None Available. FINDINGS: No evidence for an acute fracture or subluxation in the thoracic spine. There is convex rightward lower thoracic scoliosis. Swimmer's view reveals marked anterior osteophytosis in the lower cervical spine. IMPRESSION: 1. No acute bony findings. 2. Convex rightward lower thoracic scoliosis. Electronically Signed   By: Camellia Candle M.D.   On: 12/13/2023 07:11    Procedures Procedures    Medications Ordered in ED Medications  oxyCODONE -acetaminophen  (PERCOCET/ROXICET) 5-325 MG per tablet 1 tablet (has no administration in time range)  ketorolac  (TORADOL ) injection 30 mg (30 mg Intramuscular Given 12/13/23 0525)  methocarbamol  (ROBAXIN ) tablet 1,000 mg (1,000 mg Oral Given 12/13/23 0525)  oxyCODONE -acetaminophen  (PERCOCET/ROXICET) 5-325 MG per tablet 1 tablet (1 tablet Oral Given 12/13/23 0524)    ED Course/ Medical Decision Making/ A&P                                 Medical Decision Making Amount and/or Complexity of Data Reviewed Labs: ordered. Radiology: ordered.  Risk Prescription drug management.   Patient presents with thoracic back pain in a bandlike distribution across her thoracic back at level of approximately T8.  Tenderness is present in this area musculature.  Symptoms consistent with muscle spasm/inflammation.  Multimodal pain control was ordered in the ED.  X-ray imaging shows chronic thoracic scoliosis without acute findings.  On reassessment, patient denies any improvement in her pain.  Additional dose of Percocet was ordered.  Will check lab work and urinalysis.  Care of patient signed out to oncoming ED provider.        Final Clinical Impression(s) / ED Diagnoses Final diagnoses:  Acute bilateral thoracic back pain    Rx / DC Orders ED Discharge Orders     None         Melvenia Motto, MD 12/13/23 [phone]

## 2023-12-13 NOTE — Discharge Instructions (Signed)
 Follow-up with your doctor and your pain clinic.  Hopefully the new medicine will help with controlling the pain.

## 2023-12-21 ENCOUNTER — Telehealth: Payer: Self-pay | Admitting: *Deleted

## 2023-12-21 NOTE — Progress Notes (Signed)
 Transition Care Management Unsuccessful Follow-up Telephone Call  Date of discharge and from where:  [facility]  12/13/2023  Attempts:  2nd Attempt  Reason for unsuccessful TCM follow-up call:  No answer/busy

## 2023-12-21 NOTE — Progress Notes (Signed)
 Transition Care Management Unsuccessful Follow-up Telephone Call  Date of discharge and from where:  [facility]  12/13/2023  Attempts:  1st Attempt  Reason for unsuccessful TCM follow-up call:  No answer/busy

## 2024-01-17 ENCOUNTER — Encounter (HOSPITAL_COMMUNITY): Payer: Self-pay

## 2024-01-17 ENCOUNTER — Ambulatory Visit (HOSPITAL_COMMUNITY)
Admission: EM | Admit: 2024-01-17 | Discharge: 2024-01-17 | Disposition: A | Discharge status: Home / Self Care | Payer: MEDICAID | Attending: Family Medicine | Admitting: Family Medicine

## 2024-01-17 DIAGNOSIS — M62838 Other muscle spasm: Secondary | ICD-10-CM

## 2024-01-17 DIAGNOSIS — S161XXA Strain of muscle, fascia and tendon at neck level, initial encounter: Secondary | ICD-10-CM

## 2024-01-17 MED ORDER — METHOCARBAMOL 1000 MG PO TABS: 1000.0000 mg | ORAL_TABLET | Freq: Three times a day (TID) | ORAL | 0 refills | Status: AC | PRN

## 2024-01-17 MED ORDER — DICLOFENAC SODIUM 75 MG PO TBEC: 75.0000 mg | DELAYED_RELEASE_TABLET | Freq: Two times a day (BID) | ORAL | 0 refills | Status: AC

## 2024-01-17 MED ORDER — HYDROCODONE-ACETAMINOPHEN 5-325 MG PO TABS
1.0000 | ORAL_TABLET | Freq: Once | ORAL | Status: AC
  Administered 2024-01-17: 1 via ORAL

## 2024-01-17 MED ORDER — HYDROCODONE-ACETAMINOPHEN 5-325 MG PO TABS
ORAL_TABLET | ORAL | Status: AC
  Filled 2024-01-17: qty 1
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
## 2024-04-10 ENCOUNTER — Other Ambulatory Visit: Payer: Self-pay

## 2024-04-10 ENCOUNTER — Encounter (HOSPITAL_COMMUNITY): Payer: Self-pay | Admitting: *Deleted

## 2024-04-10 ENCOUNTER — Ambulatory Visit (HOSPITAL_COMMUNITY)
Admission: EM | Admit: 2024-04-10 | Discharge: 2024-04-10 | Disposition: A | Discharge status: Home / Self Care | Payer: MEDICAID

## 2024-04-10 ENCOUNTER — Emergency Department (HOSPITAL_COMMUNITY)
Admission: EM | Admit: 2024-04-10 | Discharge: 2024-04-11 | Disposition: A | Discharge status: Home / Self Care | Payer: MEDICAID | Attending: Emergency Medicine | Admitting: Emergency Medicine

## 2024-04-10 DIAGNOSIS — N739 Female pelvic inflammatory disease, unspecified: Secondary | ICD-10-CM | POA: Insufficient documentation

## 2024-04-10 DIAGNOSIS — N939 Abnormal uterine and vaginal bleeding, unspecified: Secondary | ICD-10-CM | POA: Diagnosis present

## 2024-04-10 DIAGNOSIS — A599 Trichomoniasis, unspecified: Secondary | ICD-10-CM | POA: Insufficient documentation

## 2024-04-10 DIAGNOSIS — N730 Acute parametritis and pelvic cellulitis: Secondary | ICD-10-CM

## 2024-04-10 DIAGNOSIS — N898 Other specified noninflammatory disorders of vagina: Secondary | ICD-10-CM

## 2024-04-10 DIAGNOSIS — F17200 Nicotine dependence, unspecified, uncomplicated: Secondary | ICD-10-CM | POA: Diagnosis not present

## 2024-04-10 DIAGNOSIS — R1030 Lower abdominal pain, unspecified: Secondary | ICD-10-CM

## 2024-04-10 NOTE — ED Notes (Signed)
 Called patient x 2 for triage - no answer
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
## 2024-04-10 NOTE — ED Provider Notes (Signed)
 [name] EMERGENCY DEPARTMENT AT [facility] Provider Note   CSN: [id number] Arrival date & time: 04/10/24  2029     History  Chief Complaint  Patient presents with   Vaginal Discharge    [name] is a 48 y.o. female with history of chronic pain presents emerged from today for evaluation of increasing vaginal discharge.  Reports slightly brown in coloration.  Reports been going on for the past 2 days.  Reports burning and itching sensation and lower abdominal pain.  Denies any dysuria or hematuria.  Denies any urgency or frequency.  Denies any painful bowel movements or change in her bowel movements.  Denies any fever, nausea, vomiting, vaginal bleeding, constipation, diarrhea, melena or hematochezia.  Still with a pass gas.  She was sent over from urgent care for reevaluation.  She reports that she was sexually active last month and had unprotected intercourse.  She is unsure of STDs.  She reports that her last visual period was over a year ago.  She is denies parents taking any blood vaginal bleeding.  Abdominal surgeries include C-sections and bowel obstructions that were improved nonsurgically.  She has daily tobacco use but denies any EtOH or drug use.  No known drug allergies.   Vaginal Discharge Associated symptoms: abdominal pain   Associated symptoms: no dysuria, no fever, no nausea and no vomiting        Home Medications Prior to Admission medications   Medication Sig Start Date End Date Taking? Authorizing Provider  diclofenac  (VOLTAREN ) 75 MG EC tablet Take 1 tablet (75 mg total) by mouth 2 (two) times daily. 01/17/24   Afton Albright, MD  methocarbamol  1000 MG TABS Take 1,000 mg by mouth every 8 (eight) hours as needed for muscle spasms. 01/17/24   Afton Albright, MD  naloxone  (NARCAN ) nasal spray 4 mg/0.1 mL 1 spray as directed. 10/24/21   [provider]  predniSONE  (DELTASONE ) 20 MG tablet Take 2 tablets (40 mg total) by mouth daily. 12/13/23    Mozell Arias, MD  ferrous sulfate  325 (65 FE) MG tablet Take 1 tablet (325 mg total) by mouth 2 (two) times daily with a meal. Patient not taking: Reported on 03/19/2018 02/06/18 06/22/20  Krishnan, Gokul, MD  linaclotide  (LINZESS ) 290 MCG CAPS capsule Take 1 capsule (290 mcg total) by mouth daily before breakfast. 03/26/18 06/22/20  Sheikh, Omair Latif, DO      Allergies    Patient has no known allergies.    Review of Systems   Review of Systems  Constitutional:  Negative for chills and fever.  Respiratory:  Negative for shortness of breath.   Cardiovascular:  Negative for chest pain.  Gastrointestinal:  Positive for abdominal pain. Negative for blood in stool, constipation, diarrhea, nausea and vomiting.  Genitourinary:  Positive for pelvic pain and vaginal discharge. Negative for dysuria, flank pain, frequency, hematuria, urgency and vaginal bleeding.    Physical Exam Updated Vital Signs BP 135/83 (BP Location: Right Arm)   Pulse 62   Temp 98.2 F (36.8 C)   Resp 16   Ht 5\' 4"  (1.626 m)   Wt 68 kg   LMP 05/21/2021   SpO2 100%   BMI 25.73 kg/m  Physical Exam Vitals and nursing note reviewed. Exam conducted with a chaperone present (Mikayla, NT).  Constitutional:      General: She is not in acute distress.    Appearance: She is not toxic-appearing.  Eyes:     General: No scleral icterus. Cardiovascular:  Rate and Rhythm: Normal rate.     Pulses:          Radial pulses are 2+ on the right side and 2+ on the left side.       Dorsalis pedis pulses are 2+ on the right side and 2+ on the left side.       Posterior tibial pulses are 2+ on the right side and 2+ on the left side.  Pulmonary:     Effort: Pulmonary effort is normal. No respiratory distress.  Abdominal:     General: There is no distension.     Palpations: Abdomen is soft.     Tenderness: There is abdominal tenderness. There is no guarding or rebound.     Comments: Suprapubic tenderness to palpation. Soft. No  guarding or rebound noted.   Genitourinary:    Comments: Patient has a yellow discharge present at the cervical os.  No cervical motion tenderness.  Patient does have some punctate raised red vessels on the vaginal canal walls, concerning for trichomonas.  However, the cervix appears normal. Musculoskeletal:     Right lower leg: No edema.     Left lower leg: No edema.  Skin:    General: Skin is warm and dry.  Neurological:     Mental Status: She is alert.     ED Results / Procedures / Treatments   Labs (all labs ordered are listed, but only abnormal results are displayed) Labs Reviewed - No data to display  EKG None  Radiology US  Pelvis Complete Result Date: 04/11/2024 CLINICAL DATA:  Initial evaluation for acute lower abdominal pain. EXAM: TRANSABDOMINAL AND TRANSVAGINAL ULTRASOUND OF PELVIS DOPPLER ULTRASOUND OF OVARIES TECHNIQUE: Both transabdominal and transvaginal ultrasound examinations of the pelvis were performed. Transabdominal technique was performed for global imaging of the pelvis including uterus, ovaries, adnexal regions, and pelvic cul-de-sac. It was necessary to proceed with endovaginal exam following the transabdominal exam to visualize the endometrium. Color and duplex Doppler ultrasound was utilized to evaluate blood flow to the ovaries. COMPARISON:  CT performed earlier the same day. FINDINGS: Uterus Measurements: 9.7 x 3.4 x 4.6 cm = volume: 79.2 mL. Uterus is retroverted. No discrete fibroid or other myometrial abnormality. Endometrium Thickness: 7.1 mm.  No focal abnormality visualized. Right ovary Measurements: 2.6 x 1.7 x 1.8 cm = volume: 4.1 mL. Normal appearance/no adnexal mass. Left ovary Not visualized.  No adnexal mass. Pulsed Doppler evaluation of the right ovary demonstrates normal low-resistance arterial and venous waveforms. Other findings No abnormal free fluid. IMPRESSION: 1. No acute abnormality within the pelvis. 2. Normal right ovary, with nonvisualization  of the left ovary. No adnexal mass or free fluid. No evidence for right ovarian torsion. 3. Normal sonographic appearance of the uterus and endometrium. Electronically Signed   By: Virgia Griffins M.D.   On: 04/11/2024 03:25   US  Transvaginal Non-OB Result Date: 04/11/2024 CLINICAL DATA:  Initial evaluation for acute lower abdominal pain. EXAM: TRANSABDOMINAL AND TRANSVAGINAL ULTRASOUND OF PELVIS DOPPLER ULTRASOUND OF OVARIES TECHNIQUE: Both transabdominal and transvaginal ultrasound examinations of the pelvis were performed. Transabdominal technique was performed for global imaging of the pelvis including uterus, ovaries, adnexal regions, and pelvic cul-de-sac. It was necessary to proceed with endovaginal exam following the transabdominal exam to visualize the endometrium. Color and duplex Doppler ultrasound was utilized to evaluate blood flow to the ovaries. COMPARISON:  CT performed earlier the same day. FINDINGS: Uterus Measurements: 9.7 x 3.4 x 4.6 cm = volume: 79.2 mL. Uterus is retroverted.  No discrete fibroid or other myometrial abnormality. Endometrium Thickness: 7.1 mm.  No focal abnormality visualized. Right ovary Measurements: 2.6 x 1.7 x 1.8 cm = volume: 4.1 mL. Normal appearance/no adnexal mass. Left ovary Not visualized.  No adnexal mass. Pulsed Doppler evaluation of the right ovary demonstrates normal low-resistance arterial and venous waveforms. Other findings No abnormal free fluid. IMPRESSION: 1. No acute abnormality within the pelvis. 2. Normal right ovary, with nonvisualization of the left ovary. No adnexal mass or free fluid. No evidence for right ovarian torsion. 3. Normal sonographic appearance of the uterus and endometrium. Electronically Signed   By: Virgia Griffins M.D.   On: 04/11/2024 03:25   US  Art/Ven Flow Abd Pelv Doppler Result Date: 04/11/2024 CLINICAL DATA:  Initial evaluation for acute lower abdominal pain. EXAM: TRANSABDOMINAL AND TRANSVAGINAL ULTRASOUND OF PELVIS  DOPPLER ULTRASOUND OF OVARIES TECHNIQUE: Both transabdominal and transvaginal ultrasound examinations of the pelvis were performed. Transabdominal technique was performed for global imaging of the pelvis including uterus, ovaries, adnexal regions, and pelvic cul-de-sac. It was necessary to proceed with endovaginal exam following the transabdominal exam to visualize the endometrium. Color and duplex Doppler ultrasound was utilized to evaluate blood flow to the ovaries. COMPARISON:  CT performed earlier the same day. FINDINGS: Uterus Measurements: 9.7 x 3.4 x 4.6 cm = volume: 79.2 mL. Uterus is retroverted. No discrete fibroid or other myometrial abnormality. Endometrium Thickness: 7.1 mm.  No focal abnormality visualized. Right ovary Measurements: 2.6 x 1.7 x 1.8 cm = volume: 4.1 mL. Normal appearance/no adnexal mass. Left ovary Not visualized.  No adnexal mass. Pulsed Doppler evaluation of the right ovary demonstrates normal low-resistance arterial and venous waveforms. Other findings No abnormal free fluid. IMPRESSION: 1. No acute abnormality within the pelvis. 2. Normal right ovary, with nonvisualization of the left ovary. No adnexal mass or free fluid. No evidence for right ovarian torsion. 3. Normal sonographic appearance of the uterus and endometrium. Electronically Signed   By: Virgia Griffins M.D.   On: 04/11/2024 03:25   CT ABDOMEN PELVIS W CONTRAST Result Date: 04/11/2024 EXAM: CT ABDOMEN AND PELVIS WITH CONTRAST 04/11/2024 01:42:16 AM TECHNIQUE: CT of the abdomen and pelvis was performed with the administration of intravenous contrast. Multiplanar reformatted images are provided for review. Automated exposure control, iterative reconstruction, and/or weight based adjustment of the mA/kV was utilized to reduce the radiation dose to as low as reasonably achievable. COMPARISON: 08/10/2023 CLINICAL HISTORY: LLQ abdominal pain; RLQ abdominal pain; lower abdominal pain. FINDINGS: LOWER CHEST: No acute  abnormality. HEPATOBILIARY: The liver is unremarkable. Gallbladder is unremarkable. No biliary ductal dilatation. SPLEEN: No acute abnormality. PANCREAS: No acute abnormality. ADRENAL GLANDS: No acute abnormality. KIDNEYS, URETERS AND BLADDER: 4.0 cm simple renal cyst, benign, no follow-up is recommended. No stones in the kidneys or ureters. No evidence of hydronephrosis. No evidence of perinephric or periureteral stranding. Urinary bladder is unremarkable. GI AND BOWEL: Stomach demonstrates no acute abnormality. There is no evidence of bowel obstruction. Normal appendix (image 55). PERITONEUM AND RETROPERITONEUM: No evidence of ascites. No free air. Atherosclerotic calcifications of the abdominal aorta and branch vessels, although patent. LYMPH NODES: No evidence of lymphadenopathy. REPRODUCTIVE ORGANS: No acute abnormality. BONES AND SOFT TISSUES: No acute osseous abnormality. No focal soft tissue abnormality. IMPRESSION: 1. Normal appendix. 2. No CT findings to account for the patient's lower abdominal pain. Electronically signed by: Zadie Herter MD 04/11/2024 01:55 AM EDT RP Workstation: [id number]    Procedures Procedures    Medications Ordered in ED Medications -  No data to display  ED Course/ Medical Decision Making/ A&P                                 Medical Decision Making Amount and/or Complexity of Data Reviewed Labs: ordered. Radiology: ordered.  Risk Prescription drug management.   48 y.o. female presents to the ER for evaluation of vaginal pain and discharge. Differential diagnosis includes but is not limited to STD, bacterial vaginosis, TOA, PID, yeast. Vital signs unremarkable. Physical exam as noted above.   Patient does not have any guarding or rebound.  She does have a history of small bowel obstructions.  Will order CT abdomen first and if unremarkable we will proceed with ultrasound.  I independently reviewed and interpreted the patient's labs.  RPR HIV pending.   CMP shows no electrolyte or LFT abnormality.  CBC without leukocytosis or anemia.  hCG is negative.  Urinalysis shows hazy urine is concentrated.  There is large amount of leukocytes with some red blood cells and white blood cells present however rare bacteria.  She is not having any urinary symptoms.  Urine culture ordered.  Wet prep shows trichomonas present.  GC pending.  CT AP 1. Normal appendix. 2. No CT findings to account for the patient's lower abdominal pain. Per radiologist's interpretation.    Ultrasound shows 1. No acute abnormality within the pelvis. 2. Normal right ovary, with nonvisualization of the left ovary. No adnexal mass or free fluid. No evidence for right ovarian torsion. 3. Normal sonographic appearance of the uterus and endometrium. Per radiologist's interpretation.    Will culture urine to see the grows out any infection.  Given the patient's suprapubic pain as well as a positive trichomonas, will treat for PID.  She does not appear to be in any acute distress or pain, I doubt any torsion.  CT imaging overall unremarkable.  Will give Rocephin  here and send home with doxycycline  and metronidazole .  I stressed with her the importance of alerting partners and obtaining from sex to prevent the spread.  We discussed the importance of following up with gynecology as you can come very sick if this is not appropriately treated.  She verbalizes her understanding.  Abdomen still soft without any peritoneal signs.  She is stable for discharge home with close outpatient follow-up.  We discussed the results of the labs/imaging. The plan is alert partners, abstain from intercourse, take antibiotics as prescribed, follow up with GYN. We discussed strict return precautions and red flag symptoms. The patient verbalized their understanding and agrees to the plan. The patient is stable and being discharged home in good condition.  Portions of this report may have been transcribed using voice  recognition software. Every effort was made to ensure accuracy; however, inadvertent computerized transcription errors may be present.   Final Clinical Impression(s) / ED Diagnoses Final diagnoses:  PID (acute pelvic inflammatory disease)  Trichomoniasis    Rx / DC Orders ED Discharge Orders          Ordered    Ambulatory referral to Gynecology        04/11/24 0438    doxycycline  (VIBRAMYCIN ) 100 MG capsule  2 times daily        04/11/24 0440    metroNIDAZOLE  (FLAGYL ) 500 MG tablet  2 times daily        04/11/24 0440              Kedric Passy,  Shirlie Dove 04/12/24 2109    Ballard Bongo, MD 04/15/24 0730

## 2024-04-10 NOTE — ED Triage Notes (Signed)
 PT reports having real bad ABD pain and vag discharge . Pt reports skin on inner thigs burn from discharge.

## 2024-04-10 NOTE — ED Triage Notes (Signed)
 PT DOB and full name confirmed .

## 2024-04-10 NOTE — ED Triage Notes (Signed)
 The pt has had a white vaginal discharge  for 2 days that itches and burns lmp none  she was sent here from [facility] for treatment

## 2024-04-11 ENCOUNTER — Emergency Department (HOSPITAL_COMMUNITY): Payer: MEDICAID

## 2024-04-11 LAB — URINALYSIS, ROUTINE W REFLEX MICROSCOPIC
Bilirubin Urine: NEGATIVE
Glucose, UA: NEGATIVE mg/dL
Hgb urine dipstick: NEGATIVE
Ketones, ur: NEGATIVE mg/dL
Nitrite: NEGATIVE
Protein, ur: NEGATIVE mg/dL
Specific Gravity, Urine: 1.031 — ABNORMAL HIGH (ref 1.005–1.030)
pH: 5 (ref 5.0–8.0)

## 2024-04-11 LAB — COMPREHENSIVE METABOLIC PANEL WITH GFR
ALT: 35 U/L (ref 0–44)
AST: 26 U/L (ref 15–41)
Albumin: 3.9 g/dL (ref 3.5–5.0)
Alkaline Phosphatase: 59 U/L (ref 38–126)
Anion gap: 10 (ref 5–15)
BUN: 10 mg/dL (ref 6–20)
CO2: 23 mmol/L (ref 22–32)
Calcium: 9.3 mg/dL (ref 8.9–10.3)
Chloride: 106 mmol/L (ref 98–111)
Creatinine, Ser: 0.89 mg/dL (ref 0.44–1.00)
GFR, Estimated: >60 mL/min (ref >60)
Glucose, Bld: 84 mg/dL (ref 70–99)
Potassium: 3.9 mmol/L (ref 3.5–5.1)
Sodium: 139 mmol/L (ref 135–145)
Total Bilirubin: 0.8 mg/dL (ref 0.0–1.2)
Total Protein: 7 g/dL (ref 6.5–8.1)

## 2024-04-11 LAB — RAPID HIV SCREEN (HIV 1/2 AB+AG)
HIV 1/2 Antibodies: NONREACTIVE
HIV-1 P24 Antigen - HIV24: NONREACTIVE

## 2024-04-11 LAB — CBC WITH DIFFERENTIAL/PLATELET
Abs Immature Granulocytes: 0.02 10*3/uL (ref 0.00–0.07)
Basophils Absolute: 0 10*3/uL (ref 0.0–0.1)
Basophils Relative: 1 %
Eosinophils Absolute: 0.1 10*3/uL (ref 0.0–0.5)
Eosinophils Relative: 1 %
HCT: 36.5 % (ref 36.0–46.0)
Hemoglobin: 12 g/dL (ref 12.0–15.0)
Immature Granulocytes: 0 %
Lymphocytes Relative: 47 %
Lymphs Abs: 3.6 10*3/uL (ref 0.7–4.0)
MCH: 32.2 pg (ref 26.0–34.0)
MCHC: 32.9 g/dL (ref 30.0–36.0)
MCV: 97.9 fL (ref 80.0–100.0)
Monocytes Absolute: 0.5 10*3/uL (ref 0.1–1.0)
Monocytes Relative: 7 %
Neutro Abs: 3.4 10*3/uL (ref 1.7–7.7)
Neutrophils Relative %: 44 %
Platelets: 213 10*3/uL (ref 150–400)
RBC: 3.73 MIL/uL — ABNORMAL LOW (ref 3.87–5.11)
RDW: 13.2 % (ref 11.5–15.5)
WBC: 7.8 10*3/uL (ref 4.0–10.5)
nRBC: 0 % (ref 0.0–0.2)

## 2024-04-11 LAB — GC/CHLAMYDIA PROBE AMP (~~LOC~~) NOT AT ARMC
Chlamydia: NEGATIVE
Comment: NEGATIVE
Comment: NORMAL
Neisseria Gonorrhea: NEGATIVE

## 2024-04-11 LAB — HCG, SERUM, QUALITATIVE: Preg, Serum: NEGATIVE

## 2024-04-11 LAB — WET PREP, GENITAL
Clue Cells Wet Prep HPF POC: NONE SEEN
Sperm: NONE SEEN
WBC, Wet Prep HPF POC: <10 (ref <10)
Yeast Wet Prep HPF POC: NONE SEEN

## 2024-04-11 LAB — RPR: RPR Ser Ql: NONREACTIVE

## 2024-04-11 MED ORDER — MORPHINE SULFATE (PF) 4 MG/ML IV SOLN
4.0000 mg | Freq: Once | INTRAVENOUS | Status: AC
  Administered 2024-04-11: 4 mg via INTRAVENOUS
  Filled 2024-04-11: qty 1

## 2024-04-11 MED ORDER — DOXYCYCLINE HYCLATE 100 MG PO CAPS: 100.0000 mg | ORAL_CAPSULE | Freq: Two times a day (BID) | ORAL | 0 refills | Status: AC

## 2024-04-11 MED ORDER — LIDOCAINE HCL (PF) 1 % IJ SOLN
2.0000 mL | Freq: Once | INTRAMUSCULAR | Status: AC
  Administered 2024-04-11: 1 mL
  Filled 2024-04-11: qty 5

## 2024-04-11 MED ORDER — IOHEXOL 350 MG/ML SOLN
75.0000 mL | Freq: Once | INTRAVENOUS | Status: AC | PRN
  Administered 2024-04-11: 75 mL via INTRAVENOUS

## 2024-04-11 MED ORDER — METRONIDAZOLE 500 MG PO TABS: 500.0000 mg | ORAL_TABLET | Freq: Two times a day (BID) | ORAL | 0 refills | Status: AC

## 2024-04-11 MED ORDER — CEFTRIAXONE SODIUM 500 MG IJ SOLR
500.0000 mg | Freq: Once | INTRAMUSCULAR | Status: AC
  Administered 2024-04-11: 500 mg via INTRAMUSCULAR
  Filled 2024-04-11: qty 500

## 2024-04-11 NOTE — ED Notes (Signed)
 Please give an update to sister, Amy [phone]

## 2024-04-11 NOTE — Discharge Instructions (Addendum)
 You were seen in the ER today for evaluation of your pain. You have trichomoniasis which is an STD.  You will need to alert your partners as they will need to be tested and treated.  Is important that you adhere to the medications to take them daily for the next 2 weeks.  Please do not miss any doses.  You can take your already prescribed pain medication at home.  Please make sure that you are staying well-hydrated drinking plenty of fluids, mainly water .  It is extremely important for you to follow-up with a gynecologist.  I have put in the referral for 1.  They should reach out to you in the next few days to schedule, if not, please call them to schedule.  You will need to abstain from any sexual contact until you are finished with the antibiotics and reevaluated by gynecology.  If you start to have any fever, nausea, vomiting, worsening abdominal pain, pain with urination, please return to your nearest emergency department for evaluation.  If you have any other concerns, new or worsening symptoms, please return to your nearest emergency department for reevaluation.  Contact a doctor if: You have more fluid or fluid that is not normal coming from your vagina. Your pain does not get better. You have a fever or chills. You cannot take your medicines. Your partner has an STI. You have pain when you pee. Get help right away if: You have more pain in the belly area. Your symptoms are getting worse. You are not better in 72 hours with treatment.

## 2024-04-30 ENCOUNTER — Ambulatory Visit: Payer: MEDICAID | Admitting: Nurse Practitioner

## 2024-12-15 ENCOUNTER — Ambulatory Visit (HOSPITAL_COMMUNITY)
Admission: EM | Admit: 2024-12-15 | Discharge: 2024-12-15 | Disposition: A | Discharge status: Home / Self Care | Payer: MEDICAID

## 2024-12-15 ENCOUNTER — Encounter (HOSPITAL_COMMUNITY): Payer: Self-pay | Admitting: Emergency Medicine

## 2024-12-15 DIAGNOSIS — N342 Other urethritis: Secondary | ICD-10-CM | POA: Diagnosis not present

## 2024-12-15 DIAGNOSIS — N760 Acute vaginitis: Secondary | ICD-10-CM | POA: Diagnosis not present

## 2024-12-15 LAB — POCT URINE DIPSTICK
Bilirubin, UA: NEGATIVE
Glucose, UA: NEGATIVE mg/dL
Ketones, POC UA: NEGATIVE mg/dL
Nitrite, UA: NEGATIVE
POC PROTEIN,UA: NEGATIVE
Spec Grav, UA: 1.02
Urobilinogen, UA: 0.2 U/dL
pH, UA: 7

## 2024-12-15 MED ORDER — NITROFURANTOIN MONOHYD MACRO 100 MG PO CAPS: 100.0000 mg | ORAL_CAPSULE | Freq: Two times a day (BID) | ORAL | 0 refills | Status: AC
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
## 2024-12-15 NOTE — ED Triage Notes (Signed)
 Patient has abdominal pain that has been going on for 5 days.  Patient has had vaginal itching for 6 or 7 days.  Patient has not taken any medication.

## 2024-12-15 NOTE — Discharge Instructions (Addendum)
" °  1. Acute vaginitis (Primary) - Cervicovaginal swab collected in UC and sent to lab for further testing results should be available in 2 to 3 days.  2. Infective urethritis - POC Urinalysis Dipstick completed in UC shows trace leukocytes, small blood, no nitrite, these findings are possibly indicative of early urinary tract infection. - Urine Culture collected in UC and sent to lab for further testing results should be available in 2 to 3 days. - nitrofurantoin , macrocrystal-monohydrate, (MACROBID ) 100 MG capsule; Take 1 capsule (100 mg total) by mouth 2 (two) times daily.  Dispense: 10 capsule; Refill: 0  -Continue to monitor symptoms for any change in severity if there is any escalation of current symptoms or development of new symptoms follow-up in ER for further evaluation and management. "

## 2024-12-16 LAB — CERVICOVAGINAL ANCILLARY ONLY
Bacterial Vaginitis (gardnerella): POSITIVE — AB
Candida Glabrata: NEGATIVE
Candida Vaginitis: NEGATIVE
Chlamydia: NEGATIVE
Comment: NEGATIVE
Comment: NEGATIVE
Comment: NEGATIVE
Comment: NEGATIVE
Comment: NEGATIVE
Comment: NORMAL
Neisseria Gonorrhea: NEGATIVE
Trichomonas: NEGATIVE

## 2024-12-17 LAB — URINE CULTURE
Culture: >=100000 — AB
Special Requests: NORMAL

## 2024-12-29 ENCOUNTER — Ambulatory Visit (HOSPITAL_COMMUNITY): Payer: MEDICAID

## 2024-12-29 ENCOUNTER — Ambulatory Visit (HOSPITAL_COMMUNITY)
Admission: EM | Admit: 2024-12-29 | Discharge: 2024-12-29 | Disposition: A | Discharge status: Home / Self Care | Payer: MEDICAID | Attending: Internal Medicine | Admitting: Internal Medicine

## 2024-12-29 ENCOUNTER — Encounter (HOSPITAL_COMMUNITY): Payer: Self-pay

## 2024-12-29 DIAGNOSIS — M79644 Pain in right finger(s): Secondary | ICD-10-CM

## 2024-12-29 MED ORDER — KETOROLAC TROMETHAMINE 30 MG/ML IJ SOLN
INTRAMUSCULAR | Status: AC
  Filled 2024-12-29: qty 1

## 2024-12-29 MED ORDER — PREDNISONE 20 MG PO TABS: 40.0000 mg | ORAL_TABLET | Freq: Every day | ORAL | 0 refills | Status: AC

## 2024-12-29 MED ORDER — DOXYCYCLINE HYCLATE 100 MG PO CAPS: 100.0000 mg | ORAL_CAPSULE | Freq: Two times a day (BID) | ORAL | 0 refills | Status: AC

## 2024-12-29 MED ORDER — KETOROLAC TROMETHAMINE 30 MG/ML IJ SOLN
30.0000 mg | Freq: Once | INTRAMUSCULAR | Status: AC
  Administered 2024-12-29: 30 mg via INTRAMUSCULAR

## 2024-12-29 NOTE — ED Triage Notes (Signed)
 Patient reports right hand and thumb pain x 3 days. Denies any injury.

## 2024-12-29 NOTE — Discharge Instructions (Addendum)
 X-ray of the right thumb done today.  Final evaluation by the radiologist shows a possible old avulsion fracture but no acute findings.  Unsure the underlying etiology for the swelling and pain but will cover for inflammation and infection and if no improvement over 3 to 4 days then may need to follow-up with orthopedics.  We will treat with the following: Toradol  injection given today. This is a medication to help with pain. This is not a narcotic.   Doxycycline  100 mg twice daily for 7 days. Take this with food.  This antibiotic can cause you to be more sensitive to the sun. Prednisone  40 mg (2 tablets) once daily for 5 days. Take this in the morning.  This is a steroid to help with inflammation and pain.  Ice the area 2-3 times daily for 10-15 minutes to help with pain and swelling. Do not apply ice directly to the skin.   Return to urgent care or PCP if symptoms worsen or fail to resolve.
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
